# Patient Record
Sex: Male | Born: 1935
Health system: Southern US, Community
[De-identification: ages and names within clinical notes are randomized; demographics above are authoritative.]

## PROBLEM LIST (undated history)

## (undated) DIAGNOSIS — N200 Calculus of kidney: Secondary | ICD-10-CM

## (undated) DIAGNOSIS — K5790 Diverticulosis of intestine, part unspecified, without perforation or abscess without bleeding: Secondary | ICD-10-CM

## (undated) DIAGNOSIS — I10 Essential (primary) hypertension: Secondary | ICD-10-CM

## (undated) DIAGNOSIS — I639 Cerebral infarction, unspecified: Secondary | ICD-10-CM

## (undated) DIAGNOSIS — F17200 Nicotine dependence, unspecified, uncomplicated: Secondary | ICD-10-CM

## (undated) DIAGNOSIS — I6529 Occlusion and stenosis of unspecified carotid artery: Secondary | ICD-10-CM

## (undated) DIAGNOSIS — E785 Hyperlipidemia, unspecified: Secondary | ICD-10-CM

## (undated) HISTORY — DX: Essential (primary) hypertension: I10

## (undated) HISTORY — DX: Diverticulosis of intestine, part unspecified, without perforation or abscess without bleeding: K57.90

## (undated) HISTORY — DX: Hyperlipidemia, unspecified: E78.5

## (undated) HISTORY — PX: APPENDECTOMY: SHX54

## (undated) HISTORY — PX: HERNIA REPAIR: SHX51

## (undated) HISTORY — DX: Nicotine dependence, unspecified, uncomplicated: F17.200

## (undated) HISTORY — DX: Cerebral infarction, unspecified: I63.9

---

## 2008-10-12 ENCOUNTER — Ambulatory Visit: Payer: Self-pay | Admitting: Family Medicine

## 2008-10-22 ENCOUNTER — Ambulatory Visit: Payer: Self-pay | Admitting: Family Medicine

## 2008-11-02 ENCOUNTER — Ambulatory Visit: Payer: Self-pay | Admitting: Family Medicine

## 2008-11-13 ENCOUNTER — Ambulatory Visit: Payer: Self-pay | Admitting: Gastroenterology

## 2008-11-27 ENCOUNTER — Ambulatory Visit: Payer: Self-pay | Admitting: Gastroenterology

## 2008-12-08 ENCOUNTER — Ambulatory Visit: Payer: Self-pay | Admitting: Family Medicine

## 2009-05-22 DIAGNOSIS — I639 Cerebral infarction, unspecified: Secondary | ICD-10-CM

## 2009-05-22 HISTORY — DX: Cerebral infarction, unspecified: I63.9

## 2009-09-14 ENCOUNTER — Ambulatory Visit: Payer: Self-pay | Admitting: Family Medicine

## 2009-09-16 ENCOUNTER — Encounter: Payer: Self-pay | Admitting: Cardiovascular Disease

## 2009-09-27 ENCOUNTER — Encounter: Payer: Self-pay | Admitting: Cardiovascular Disease

## 2009-10-19 ENCOUNTER — Ambulatory Visit (HOSPITAL_COMMUNITY): Admission: RE | Admit: 2009-10-19 | Discharge: 2009-10-19 | Payer: Self-pay | Admitting: General Surgery

## 2009-10-19 ENCOUNTER — Encounter: Payer: Self-pay | Admitting: Cardiovascular Disease

## 2009-10-19 ENCOUNTER — Encounter: Admission: RE | Admit: 2009-10-19 | Discharge: 2009-10-19 | Payer: Self-pay | Admitting: General Surgery

## 2009-10-20 ENCOUNTER — Encounter: Payer: Self-pay | Admitting: Cardiovascular Disease

## 2009-10-21 ENCOUNTER — Ambulatory Visit: Payer: Self-pay | Admitting: Cardiovascular Disease

## 2009-10-21 DIAGNOSIS — I4510 Unspecified right bundle-branch block: Secondary | ICD-10-CM

## 2009-10-26 ENCOUNTER — Encounter: Payer: Self-pay | Admitting: Cardiovascular Disease

## 2009-10-26 ENCOUNTER — Ambulatory Visit (HOSPITAL_COMMUNITY): Admission: RE | Admit: 2009-10-26 | Discharge: 2009-10-26 | Payer: Self-pay | Admitting: Cardiovascular Disease

## 2009-10-26 ENCOUNTER — Ambulatory Visit: Payer: Self-pay | Admitting: Cardiovascular Disease

## 2009-10-26 ENCOUNTER — Ambulatory Visit: Payer: Self-pay

## 2009-11-16 ENCOUNTER — Ambulatory Visit (HOSPITAL_COMMUNITY): Admission: RE | Admit: 2009-11-16 | Discharge: 2009-11-16 | Payer: Self-pay | Admitting: General Surgery

## 2009-11-17 ENCOUNTER — Inpatient Hospital Stay (HOSPITAL_COMMUNITY): Admission: EM | Admit: 2009-11-17 | Discharge: 2009-11-24 | Payer: Self-pay | Admitting: Emergency Medicine

## 2009-11-17 ENCOUNTER — Ambulatory Visit: Payer: Self-pay | Admitting: Internal Medicine

## 2009-11-17 DIAGNOSIS — I639 Cerebral infarction, unspecified: Secondary | ICD-10-CM

## 2009-11-17 HISTORY — DX: Cerebral infarction, unspecified: I63.9

## 2009-11-19 ENCOUNTER — Ambulatory Visit: Payer: Self-pay | Admitting: Physical Medicine & Rehabilitation

## 2009-11-19 ENCOUNTER — Encounter (INDEPENDENT_AMBULATORY_CARE_PROVIDER_SITE_OTHER): Payer: Self-pay | Admitting: Pediatrics

## 2009-11-24 ENCOUNTER — Inpatient Hospital Stay (HOSPITAL_COMMUNITY)
Admission: RE | Admit: 2009-11-24 | Discharge: 2009-12-15 | Payer: Self-pay | Admitting: Physical Medicine & Rehabilitation

## 2009-11-25 ENCOUNTER — Ambulatory Visit: Payer: Self-pay | Admitting: Physical Medicine & Rehabilitation

## 2009-12-17 DIAGNOSIS — E785 Hyperlipidemia, unspecified: Secondary | ICD-10-CM | POA: Insufficient documentation

## 2009-12-17 DIAGNOSIS — I69959 Hemiplegia and hemiparesis following unspecified cerebrovascular disease affecting unspecified side: Secondary | ICD-10-CM

## 2010-03-16 ENCOUNTER — Ambulatory Visit: Payer: Self-pay | Admitting: Family Medicine

## 2010-04-26 ENCOUNTER — Encounter
Admission: RE | Admit: 2010-04-26 | Discharge: 2010-05-18 | Payer: Self-pay | Source: Home / Self Care | Attending: Family Medicine | Admitting: Family Medicine

## 2010-05-11 ENCOUNTER — Ambulatory Visit: Payer: Self-pay | Admitting: Family Medicine

## 2010-05-18 ENCOUNTER — Encounter
Admission: RE | Admit: 2010-05-18 | Discharge: 2010-06-21 | Payer: Self-pay | Source: Home / Self Care | Attending: Family Medicine | Admitting: Family Medicine

## 2010-06-01 ENCOUNTER — Encounter: Admit: 2010-06-01 | Payer: Self-pay | Admitting: Family Medicine

## 2010-06-04 ENCOUNTER — Observation Stay (HOSPITAL_COMMUNITY)
Admission: EM | Admit: 2010-06-04 | Discharge: 2010-06-06 | Payer: Self-pay | Source: Home / Self Care | Attending: Urology | Admitting: Urology

## 2010-06-06 LAB — DIFFERENTIAL
Basophils Absolute: 0 10*3/uL (ref 0.0–0.1)
Basophils Absolute: 0 10*3/uL (ref 0.0–0.1)
Basophils Relative: 0 % (ref 0–1)
Basophils Relative: 0 % (ref 0–1)
Eosinophils Absolute: 0 10*3/uL (ref 0.0–0.7)
Eosinophils Absolute: 0 10*3/uL (ref 0.0–0.7)
Eosinophils Relative: 0 % (ref 0–5)
Eosinophils Relative: 0 % (ref 0–5)
Lymphocytes Relative: 3 % — ABNORMAL LOW (ref 12–46)
Lymphocytes Relative: 9 % — ABNORMAL LOW (ref 12–46)
Lymphs Abs: 0.5 10*3/uL — ABNORMAL LOW (ref 0.7–4.0)
Lymphs Abs: 1.3 10*3/uL (ref 0.7–4.0)
Monocytes Absolute: 0.9 10*3/uL (ref 0.1–1.0)
Monocytes Absolute: 1.3 10*3/uL — ABNORMAL HIGH (ref 0.1–1.0)
Monocytes Relative: 5 % (ref 3–12)
Monocytes Relative: 10 % (ref 3–12)
Neutro Abs: 14.6 10*3/uL — ABNORMAL HIGH (ref 1.7–7.7)
Neutro Abs: 11.5 10*3/uL — ABNORMAL HIGH (ref 1.7–7.7)
Neutrophils Relative %: 92 % — ABNORMAL HIGH (ref 43–77)
Neutrophils Relative %: 81 % — ABNORMAL HIGH (ref 43–77)

## 2010-06-06 LAB — URINALYSIS, ROUTINE W REFLEX MICROSCOPIC
Bilirubin Urine: NEGATIVE
Nitrite: POSITIVE — AB
Protein, ur: 30 mg/dL — AB
Specific Gravity, Urine: 1.022 (ref 1.005–1.030)
Urine Glucose, Fasting: NEGATIVE mg/dL
Urobilinogen, UA: 0.2 mg/dL (ref 0.0–1.0)
pH: 5.5 (ref 5.0–8.0)

## 2010-06-06 LAB — BASIC METABOLIC PANEL
BUN: 18 mg/dL (ref 6–23)
CO2: 25 mEq/L (ref 19–32)
Calcium: 9 mg/dL (ref 8.4–10.5)
Chloride: 105 mEq/L (ref 96–112)
Creatinine, Ser: 0.79 mg/dL (ref 0.4–1.5)
GFR calc Af Amer: >60 mL/min (ref >60)
GFR calc non Af Amer: >60 mL/min (ref >60)
Glucose, Bld: 129 mg/dL — ABNORMAL HIGH (ref 70–99)
Potassium: 3.9 mEq/L (ref 3.5–5.1)
Sodium: 138 mEq/L (ref 135–145)

## 2010-06-06 LAB — CBC
HCT: 41 % (ref 39.0–52.0)
HCT: 38.2 % — ABNORMAL LOW (ref 39.0–52.0)
Hemoglobin: 14.1 g/dL (ref 13.0–17.0)
Hemoglobin: 13.2 g/dL (ref 13.0–17.0)
MCH: 32.5 pg (ref 26.0–34.0)
MCH: 32.6 pg (ref 26.0–34.0)
MCHC: 34.4 g/dL (ref 30.0–36.0)
MCHC: 34.6 g/dL (ref 30.0–36.0)
MCV: 94.5 fL (ref 78.0–100.0)
MCV: 94.3 fL (ref 78.0–100.0)
Platelets: 233 10*3/uL (ref 150–400)
Platelets: 228 10*3/uL (ref 150–400)
RBC: 4.34 MIL/uL (ref 4.22–5.81)
RBC: 4.05 MIL/uL — ABNORMAL LOW (ref 4.22–5.81)
RDW: 13.7 % (ref 11.5–15.5)
RDW: 13.9 % (ref 11.5–15.5)
WBC: 16 10*3/uL — ABNORMAL HIGH (ref 4.0–10.5)
WBC: 14.1 10*3/uL — ABNORMAL HIGH (ref 4.0–10.5)

## 2010-06-06 LAB — URINE MICROSCOPIC-ADD ON

## 2010-06-08 LAB — DIFFERENTIAL
Basophils Absolute: 0 10*3/uL (ref 0.0–0.1)
Basophils Relative: 0 % (ref 0–1)
Eosinophils Absolute: 0 10*3/uL (ref 0.0–0.7)
Eosinophils Relative: 0 % (ref 0–5)
Lymphocytes Relative: 4 % — ABNORMAL LOW (ref 12–46)
Lymphs Abs: 0.5 10*3/uL — ABNORMAL LOW (ref 0.7–4.0)
Monocytes Absolute: 0.8 10*3/uL (ref 0.1–1.0)
Monocytes Relative: 7 % (ref 3–12)
Neutro Abs: 10.1 10*3/uL — ABNORMAL HIGH (ref 1.7–7.7)
Neutrophils Relative %: 89 % — ABNORMAL HIGH (ref 43–77)

## 2010-06-08 LAB — URINE CULTURE
Colony Count: >=100000
Culture  Setup Time: 201201151146

## 2010-06-08 LAB — CBC
HCT: 35.3 % — ABNORMAL LOW (ref 39.0–52.0)
Hemoglobin: 12.2 g/dL — ABNORMAL LOW (ref 13.0–17.0)
MCH: 32.4 pg (ref 26.0–34.0)
MCHC: 34.6 g/dL (ref 30.0–36.0)
MCV: 93.9 fL (ref 78.0–100.0)
Platelets: 223 10*3/uL (ref 150–400)
RBC: 3.76 MIL/uL — ABNORMAL LOW (ref 4.22–5.81)
RDW: 13.9 % (ref 11.5–15.5)
WBC: 11.4 10*3/uL — ABNORMAL HIGH (ref 4.0–10.5)

## 2010-06-12 ENCOUNTER — Encounter: Payer: Self-pay | Admitting: Physical Medicine & Rehabilitation

## 2010-06-12 NOTE — Discharge Summary (Signed)
  NAMEJUANDAVID, Richard Kent NO.:  [id number]  MEDICAL RECORD NO.:  [id number]          PATIENT TYPE:  INP  LOCATION:  1416                         FACILITY:  [facility]  PHYSICIAN:  Heloise Purpura, MD      DATE OF BIRTH:  [birth year]  DATE OF ADMISSION:  06/04/2010 DATE OF DISCHARGE:  06/06/2010                              DISCHARGE SUMMARY   ADMISSION DIAGNOSES: 1. Left ureteral calculus. 2. Urinary tract infection.  DISCHARGE DIAGNOSES: 1. Left ureteral calculus. 2. Urinary tract infection.  HISTORY AND PHYSICAL:  For full details, please see admission history and physical.  Briefly, Mr. Sedita is a 77 year old gentleman who presented to the emergency department on June 04, 2010, with complaints of back pain.  He underwent a CT scan, which demonstrated a 3 mm left distal ureteral calculus.  He was also noted to have leukocytosis and clearly infected urine on urinalysis.  He did remain afebrile.  We discussed options at that point.  Our plan was to try to avoid any kind of surgical intervention considering his age and medical comorbidities.  He was initially admitted to the hospital for IV pain control and IV antibiotic therapy.  He remained afebrile, but did not pass a stone and continued to have pain the following morning. Considering his apparent urinary tract infection and his inability to pass a stone and continued pain, we decided to proceed with surgical intervention and a ureteral stent.  He, therefore, went to the operating room on June 05, 2010, and a left ureteral stent was placed without complications.  He tolerated this procedure well and was transferred back to his hospital room.  He continued to remain afebrile and was maintained on IV ciprofloxacin.  The following morning, he was clinically doing well and his white blood count had improved from 14.1 to 11.4.  He was felt to be clinically stable for discharge home on continued empiric  ciprofloxacin with his urine culture results still pending.  DISPOSITION:  Home.  DISCHARGE MEDICATIONS:  He will resume his regular home medications.  In addition, he will be provided a prescription to take Vicodin as needed for pain and ciprofloxacin 500 mg p.o. b.i.d. for antibiotic therapy.  DISCHARGE INSTRUCTIONS:  He has been instructed that he may be ambulatory and has no physical activity restrictions.  He also has no dietary restrictions.  FOLLOWUP:  He will be scheduled for definitive ureteroscopic stone removal in approximately 10-14 days after appropriate antibiotic therapy.  We have discussed different treatment options and he does agree to proceed in this fashion.  He understands the potential risks, complications, and alternative treatment options associated with this approach.     Heloise Purpura, MD     LB/MEDQ  D:  06/06/2010  T:  06/06/2010  Job:  [id number]  Electronically Signed by Heloise Purpura MD on 06/12/2010 05:18:16 PM

## 2010-06-12 NOTE — H&P (Signed)
Richard Kent NO.:  [id number]  MEDICAL RECORD NO.:  [id number]          PATIENT TYPE:  EMS  LOCATION:  ED                           FACILITY:  [name]  PHYSICIAN:  Heloise Purpura, MD      DATE OF BIRTH:  1928/08/13  DATE OF ADMISSION:  06/04/2010 DATE OF DISCHARGE:                             HISTORY & PHYSICAL   CHIEF COMPLAINT:  Kidney stone.  HISTORY:  Richard Kent is a 77 year old gentleman who presented to the emergency department this afternoon after developing the acute onset of severe lower back pain.  He underwent an evaluation including a CT scan of the abdomen and pelvis without contrast which demonstrated a 3 mm distal left ureteral calculus with associated mild left hydronephrosis and hydroureter.  He was also noted a white blood count of 16,000 and his urinalysis was concerning for a urinary tract infection.  I was therefore consulted by the emergency department for further evaluationconsidering his complex stone.  PAST MEDICAL HISTORY: 1. History of cerebrovascular accident in June of 2011, chronically     managed with antiplatelet therapy.  He does have residual effects     including decrease motor function of his right upper and lower     extremities as well as urinary incontinence which was of new onset     since his stroke. 2. Hypertension. 3. Dyslipidemia.  PAST SURGICAL HISTORY: 1. Inguinal hernia repair. 2. Appendectomy.  MEDICATIONS: 1. Plavix 2. Aspirin. 3. Norvasc. 4. Lisinopril. 5. Zocor. 6. Baclofen. 7. Zanaflex 8. Senokot.  ALLERGIES:  No known drug allergies.  FAMILY HISTORY:  No history of urolithiasis or GU malignancy.  SOCIAL HISTORY:  Has a distant history of tobacco use but has quit many years.  He is married and lives at home with his wife.  REVIEW OF SYSTEMS:  A complete review of systems was performed. Pertinent negatives included no associated nausea/vomiting or fever. All other systems are  reviewed and are otherwise negative.  PHYSICAL EXAMINATION:  VITAL SIGNS:  He is currently afebrile with stable vital signs. GENERAL:  The patient is alert and oriented, in no acute distress. HEENT:  Normocephalic, atraumatic. NECK:  Supple without lymphadenopathy or JVD. CARDIOVASCULAR:  Regular rate and rhythm without obvious murmurs. LUNGS:  Clear bilaterally. ABDOMEN:  Soft, nontender, nondistended without abdominal masses or bruits. BACK:  No CVA tenderness. GU:  Normal male external genitalia. EXTREMITIES:  He has mild chronic edema of his right ankle which has been ongoing since his stroke.  This is unchanged according to his wife. He has no edema of his calf or thigh. NEUROLOGIC:  He has significant decrease motor function of his right upper and lower extremities.  LABORATORY DATA:  Urinalysis, pH 5.5.  Microscopic exam demonstrates too numerous to count white blood cells, too numerous to count red blood cells, and many bacteria with calcium oxalate crystals present.  White blood count 16.0, hemoglobin 14.1, hematocrit 41.  RADIOLOGIC IMAGING:  I independently reviewed his stone CT.  This demonstrates a 3-mm distal left ureteral calculus as stated above with associated left hydronephrosis and hydroureter.  IMPRESSIONS:  A 3-mm  left distal ureteral calculus with leukocytosis and evidence of a urinary tract infection.  PLAN:  Richard Kent is currently afebrile and otherwise asymptomatic.  I have given him the options of proceeding to the operating room for placement of a ureteral stent versus close observation with admission to the hospital and beginning antibiotic therapy with a trial of medical expulsion therapy with plans to proceed with stent placement tomorrow if he does not pass the stone.  Currently, he would like to avoid surgery if possible, especially considering his recent stroke.  I do think this is reasonable and therefore he will be admitted to hospital for  pain control and antibiotics and close observation.  He will have a KUB x-ray and his urine will be strained.  He will be on alpha-blocker therapy for medical expulsion treatment.     Heloise Purpura, MD     LB/MEDQ  D:  06/05/2010  T:  06/05/2010  Job:  [id number]  Electronically Signed by Heloise Purpura MD on 06/12/2010 05:18:11 PM

## 2010-06-12 NOTE — Op Note (Signed)
  NAMECRANSTON, Richard Kent NO.:  [id number]  MEDICAL RECORD NO.:  [id number]          PATIENT TYPE:  INP  LOCATION:  1416                         FACILITY:  [facility]  PHYSICIAN:  Heloise Purpura, MD      DATE OF BIRTH:  1928/12/17  DATE OF PROCEDURE:  06/05/2010 DATE OF DISCHARGE:                              OPERATIVE REPORT   PREOPERATIVE DIAGNOSIS:  Left ureteral stone and urinary tract infection with leukocytosis.  POSTOPERATIVE DIAGNOSIS:  Left ureteral stone and urinary tract infection with leukocytosis.  PROCEDURES: 1. Cystoscopy. 2. Left ureteral stent placement (6 x 24).  SURGEON:  Heloise Purpura, MD  ANESTHESIA:  General.  COMPLICATIONS:  None.  ESTIMATED BLOOD LOSS:  None.  INDICATION:  Richard Kent is a 76 year old gentleman who presented to the emergency department last evening with a distal left ureteral calculus. He is continued to have significant pain and also was noted to have a urinary tract infection and leukocytosis.  He remained afebrile and was initially managed with admission to the hospital and IV antibiotic therapy.  He has failed to pass the stone and after discussing the situation in more detail, he has agreed to proceed with left ureteral stent drainage considering his urinary tract infection and leukocytosis. The potential risks, complications, alternative treatment options were discussed in detail; and informed consent was obtained.  DESCRIPTION OF PROCEDURE:  The patient was taken to the operating room and a general anesthetic was administered.  He was given preoperative antibiotics, placed in the dorsal lithotomy position, and prepped and draped in the usual sterile fashion.  Next, preoperative time-out was performed.  Cystourethroscopy was then performed which revealed a normal anterior and posterior urethra.  Inspection of bladder revealed no evidence of any bladder tumors, stones, or other mucosal pathology on systematic  exam.  The ureteral orifices were in the normal anatomic position and attention turned to the left ureteral orifice which was intubated with a 0.038 sensor guidewire.  This was able to be advanced easily past the calculus which was faintly identified on fluoroscopy and passed up into the left renal pelvis under fluoroscopic guidance.  A 6 x 24 double-J ureteral stent was then advanced over the wire using Seldinger technique and positioned appropriately under fluoroscopic and cystoscopic guidance. The wire was then removed with a good curl noted in the renal pelvis as well as in the bladder.  The patient tolerated the procedure well and without complications.  He was able to be awakened and transferred to recovery unit in satisfactory condition.     Heloise Purpura, MD     LB/MEDQ  D:  06/05/2010  T:  06/05/2010  Job:  [id number]  Electronically Signed by Heloise Purpura MD on 06/12/2010 05:18:14 PM

## 2010-06-13 ENCOUNTER — Ambulatory Visit (HOSPITAL_COMMUNITY)
Admission: RE | Admit: 2010-06-13 | Discharge: 2010-06-13 | Payer: Self-pay | Source: Home / Self Care | Attending: Urology | Admitting: Urology

## 2010-06-14 LAB — SURGICAL PCR SCREEN
MRSA, PCR: NEGATIVE
Staphylococcus aureus: NEGATIVE

## 2010-06-21 NOTE — Assessment & Plan Note (Signed)
Summary: np6/surgical clearance/   Visit Type:  Initial Consult Primary Provider:  Sharlot Gowda, MD  CC:  Initial Consult/Surgical clearance-Hernia to be performed by Dr. Donell Beers.  History of Present Illness: 77 yo WM with no significant past medical history who is referred today for cardiac evaluation before planned ingiunal hernia repair on the right. He has been seen by Dr. Donell Beers from [facility]. His preoperative EKG showed RBBB with LAFB. He has no complaints. He denies chest pain, SOB, dizziness, palpitations, near syncope or syncope. He has good exercise tolerance. 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 He has no symptoms c/w claudication. He has no prior cardiac issues.   Preventive Screening-Counseling & Management  Alcohol-Tobacco     Smoking Status: quit  Caffeine-Diet-Exercise     Does Patient Exercise: yes  Current Medications (verified): 1)  Daily Multi  Tabs (Multiple Vitamins-Minerals) .... Take 1 By Mouth Once Daily  Allergies (verified): No Known Drug Allergies  Past History:  Past Medical History: Inguinal hernia  Past Surgical History: 1988 Appendectomy  Family History: Family History of Coronary Artery Disease:  Father deceased, had MI age 45, died at age 43 Mother deceased, CVA Siblings: Brother died age 36 from MI 4 sisters: 3 deceased, one with cancer, one died at age 51 ? cause        1 with MI.   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 He smoked for 20 years. Stopped 30 years ago.  Social alcohol No illicit drug use Regular Exercise - yes. Does weights at home.  Smoking Status:  quit Does Patient Exercise:  yes  Review of Systems  The patient denies fatigue, malaise, fever, weight gain/loss, vision loss, decreased hearing, hoarseness, chest pain, palpitations, shortness of breath, prolonged cough, wheezing, sleep apnea,  coughing up blood, abdominal pain, blood in stool, nausea, vomiting, diarrhea, heartburn, incontinence, blood in urine, muscle weakness, joint pain, leg swelling, rash, skin lesions, headache, fainting, dizziness, depression, anxiety, enlarged lymph nodes, easy bruising or bleeding, and environmental allergies.    Vital Signs:  Patient profile:   77 year old male Height:      72 inches Weight:      193 pounds BMI:     26.27 Pulse rate:   62 / minute BP sitting:   160 / 80  (left arm) Cuff size:   regular  Vitals Entered By: [name], EMT-P (October 21, 2009 3:55 PM)  Physical Exam  General:  General: Well developed, well nourished, NAD HEENT: OP clear, mucus membranes moist SKIN: warm, dry Neuro: No focal deficits Musculoskeletal: Muscle strength 5/5 all ext Psychiatric: Mood and affect normal Neck: No JVD, no carotid bruits, no thyromegaly, no lymphadenopathy. Lungs:Clear bilaterally, no wheezes, rhonci, crackles CV: RRR no murmurs, gallops rubs Abdomen: soft, NT, ND, BS present Extremities: No edema, pulses 2+.    EKG  Procedure date:  10/21/2009  Findings:      NSR, rate 62 bpm. RBBB. LAFB.   Impression & Recommendations:  Problem # 1:  RBBB (ICD-426.4) He is completely asymptomatic. He has bifascicular block. Will check echo to rule out structural heart disease. Based on current ACC/AHA guidelines, no ischemic workup is indicated in this pleasant gentleman prior to planned surgical procedure. I would like to get the echo before his surgery is scheduled. I will communicate the results with Dr. Donell Beers, Dr. Susann Givens and  the pt when available.   Orders: Echocardiogram (Echo)  Other Orders: EKG w/ Interpretation (93000)  Patient Instructions: 1)  Your physician recommends that you schedule a follow-up appointment in: 2 months 2)  Your physician has requested that you have an echocardiogram.  Echocardiography is a painless test that uses sound waves to create images of  your heart. It provides your doctor with information about the size and shape of your heart and how well your heart's chambers and valves are working.  This procedure takes approximately one hour. There are no restrictions for this procedure.  Appended Document: np6/surgical clearance/ Echo reviewed and ok. See attached report. Proceed with surgery. CDM  Appended Document: np6/surgical clearance/ Pt. notified

## 2010-06-21 NOTE — Letter (Signed)
Summary: [facility]: Referral Form  [facility]: Referral Form   Imported By: Earl Many 11/04/2009 16:34:53  _____________________________________________________________________  External Attachment:    Type:   Image     Comment:   External Document

## 2010-06-21 NOTE — Letter (Signed)
Summary: [facility]: Pt Medical Hx   [facility]: Pt Medical Hx   Imported By: Earl Many 11/05/2009 17:08:25  _____________________________________________________________________  External Attachment:    Type:   Image     Comment:   External Document

## 2010-06-21 NOTE — Letter (Signed)
Summary: [facility]: Pt Profile  [facility]: Pt Profile   Imported By: Earl Many 11/05/2009 17:11:17  _____________________________________________________________________  External Attachment:    Type:   Image     Comment:   External Document

## 2010-06-21 NOTE — Consult Note (Signed)
Summary: [facility]   Imported By: Earl Many 11/05/2009 17:20:24  _____________________________________________________________________  External Attachment:    Type:   Image     Comment:   External Document

## 2010-06-23 ENCOUNTER — Ambulatory Visit: Payer: Medicare Other | Attending: Family Medicine | Admitting: Physical Therapy

## 2010-06-23 ENCOUNTER — Encounter: Payer: Medicare Other | Admitting: Occupational Therapy

## 2010-06-23 DIAGNOSIS — M6281 Muscle weakness (generalized): Secondary | ICD-10-CM | POA: Insufficient documentation

## 2010-06-23 DIAGNOSIS — Z5189 Encounter for other specified aftercare: Secondary | ICD-10-CM | POA: Insufficient documentation

## 2010-06-23 DIAGNOSIS — I69998 Other sequelae following unspecified cerebrovascular disease: Secondary | ICD-10-CM | POA: Insufficient documentation

## 2010-06-23 DIAGNOSIS — M25669 Stiffness of unspecified knee, not elsewhere classified: Secondary | ICD-10-CM | POA: Insufficient documentation

## 2010-06-23 DIAGNOSIS — M2420 Disorder of ligament, unspecified site: Secondary | ICD-10-CM | POA: Insufficient documentation

## 2010-06-23 DIAGNOSIS — R279 Unspecified lack of coordination: Secondary | ICD-10-CM | POA: Insufficient documentation

## 2010-06-23 DIAGNOSIS — M2560 Stiffness of unspecified joint, not elsewhere classified: Secondary | ICD-10-CM | POA: Insufficient documentation

## 2010-06-23 DIAGNOSIS — R262 Difficulty in walking, not elsewhere classified: Secondary | ICD-10-CM | POA: Insufficient documentation

## 2010-06-23 DIAGNOSIS — M629 Disorder of muscle, unspecified: Secondary | ICD-10-CM | POA: Insufficient documentation

## 2010-06-25 NOTE — Op Note (Signed)
Richard Kent, Richard Kent NO.:  [id number]  MEDICAL RECORD NO.:  [id number]          PATIENT TYPE:  AMB  LOCATION:  DAY                          FACILITY:  [facility]  PHYSICIAN:  Heloise Purpura, MD      DATE OF BIRTH:  [birth year]  DATE OF PROCEDURE:  06/13/2010 DATE OF DISCHARGE:                              OPERATIVE REPORT   PREOPERATIVE DIAGNOSIS:  Left ureteral calculus.  POSTOPERATIVE DIAGNOSIS:  Left ureteral calculus.  PROCEDURES: 1. Cystoscopy. 2. Left ureteroscopic stone removal. 3. Left ureteral stent placement (6 x 24).  SURGEON:  Heloise Purpura, MD  ANESTHESIA:  General.  COMPLICATIONS:  None.  INDICATIONS:  Richard Kent is a 77 year old gentleman who presented to the emergency department approximately 10 days ago with complaints of left abdominal pain and was found have a 3-mm distal left ureteral calculus.  He was also noted to have a leukocytosis and a urinary tract infection.  He was admitted to the hospital on IV antibiotics and given a chance to pass the stone but was unable to.  It was therefore felt that he should undergo ureteral stent placement in the setting of an infection.  However, he did remain afebrile during his hospitalization. He was treated with culture specific antibiotics for 10 days and presents today for definitive treatment of his stone.  After discussing options for treatment, he elected to proceed with ureteroscopic therapy. The potential risks, complications, and alternative treatment options associated with the above procedures were discussed in detail and informed consent was obtained.  DESCRIPTION OF PROCEDURE:  The patient was taken to the operating room and a general anesthetic was administered.  He was given preoperative antibiotics, placed in the dorsal lithotomy position, and prepped and draped in the usual sterile fashion.  Next, a preoperative time-out was performed.  Cystourethroscopy was then performed which  revealed a normal anterior and posterior urethra.  Inspection of the bladder revealed the ureteral orifices to be in the normal anatomic position with some edema of the left ureteral orifice and an indwelling left ureteral stent.  The ureteral stent was then removed with the flexible graspers.  An attempt was made to place the wire up through the stent, although unfortunately the stent did migrate down and out of the ureter.  Therefore, the wire was replaced under direct vision.  There was noted be some mild resistance initially although the wire then passed up into the renal pelvis without difficulty.  A 6-French semi-rigid ureteroscope was then placed next to the wire and into the distal ureter.  The wire was noted to have gone intramural for short distance just below the obstructing stone.  Therefore, the wire was removed under direct vision replaced within the true lumen of the ureter.  A 0 tip nitinol basket was then used to grasp the stone and remove it without difficulty.  Based on the concern of the wire having gone intramural for short distance, it was decided to place a postoperative ureteral stent for stricture prevention.  Therefore, the wire was backloaded on the cystoscope and a 6 x 24 double-J ureteral stent was advanced over the  wire using Seldinger technique and positioned appropriately within the renal pelvis in the bladder.  The wire was then removed with good curl noted in the renal pelvis as well as in the bladder and the procedure was ended following the bladder being emptied.  He tolerated the procedure well and without complications.  He was able to be awakened and transferred to recovery unit in satisfactory condition.     Heloise Purpura, MD     LB/MEDQ  D:  06/13/2010  T:  06/13/2010  Job:  [id number]  Electronically Signed by Heloise Purpura MD on 06/25/2010 06:27:07 AM

## 2010-06-28 ENCOUNTER — Ambulatory Visit: Payer: Medicare Other | Admitting: Occupational Therapy

## 2010-06-29 ENCOUNTER — Ambulatory Visit: Payer: Medicare Other | Admitting: Physical Therapy

## 2010-06-29 ENCOUNTER — Ambulatory Visit: Payer: Medicare Other | Admitting: Occupational Therapy

## 2010-07-01 ENCOUNTER — Ambulatory Visit: Payer: Medicare Other | Admitting: Physical Therapy

## 2010-07-05 ENCOUNTER — Ambulatory Visit: Payer: Medicare Other | Admitting: Occupational Therapy

## 2010-07-05 ENCOUNTER — Ambulatory Visit: Payer: Medicare Other | Admitting: Physical Therapy

## 2010-07-07 ENCOUNTER — Ambulatory Visit: Payer: Medicare Other | Admitting: Occupational Therapy

## 2010-07-07 ENCOUNTER — Ambulatory Visit: Payer: Medicare Other | Admitting: Physical Therapy

## 2010-07-11 ENCOUNTER — Ambulatory Visit: Payer: Self-pay | Admitting: Physical Therapy

## 2010-07-11 ENCOUNTER — Encounter: Payer: Self-pay | Admitting: Occupational Therapy

## 2010-07-15 ENCOUNTER — Ambulatory Visit: Payer: Medicare Other | Admitting: Occupational Therapy

## 2010-07-15 ENCOUNTER — Ambulatory Visit: Payer: Medicare Other | Admitting: Physical Therapy

## 2010-07-18 ENCOUNTER — Ambulatory Visit: Payer: Medicare Other | Admitting: Occupational Therapy

## 2010-07-18 ENCOUNTER — Ambulatory Visit: Payer: Medicare Other | Admitting: Physical Therapy

## 2010-07-20 ENCOUNTER — Ambulatory Visit: Payer: Self-pay | Admitting: Physical Therapy

## 2010-07-20 ENCOUNTER — Ambulatory Visit: Payer: Medicare Other | Admitting: Occupational Therapy

## 2010-07-22 ENCOUNTER — Ambulatory Visit: Payer: Medicare Other | Attending: Family Medicine | Admitting: Physical Therapy

## 2010-07-22 DIAGNOSIS — M25669 Stiffness of unspecified knee, not elsewhere classified: Secondary | ICD-10-CM | POA: Insufficient documentation

## 2010-07-22 DIAGNOSIS — M2560 Stiffness of unspecified joint, not elsewhere classified: Secondary | ICD-10-CM | POA: Insufficient documentation

## 2010-07-22 DIAGNOSIS — M6281 Muscle weakness (generalized): Secondary | ICD-10-CM | POA: Insufficient documentation

## 2010-07-22 DIAGNOSIS — Z5189 Encounter for other specified aftercare: Secondary | ICD-10-CM | POA: Insufficient documentation

## 2010-07-22 DIAGNOSIS — I69998 Other sequelae following unspecified cerebrovascular disease: Secondary | ICD-10-CM | POA: Insufficient documentation

## 2010-07-22 DIAGNOSIS — R262 Difficulty in walking, not elsewhere classified: Secondary | ICD-10-CM | POA: Insufficient documentation

## 2010-07-22 DIAGNOSIS — M629 Disorder of muscle, unspecified: Secondary | ICD-10-CM | POA: Insufficient documentation

## 2010-07-22 DIAGNOSIS — R279 Unspecified lack of coordination: Secondary | ICD-10-CM | POA: Insufficient documentation

## 2010-07-22 DIAGNOSIS — M2420 Disorder of ligament, unspecified site: Secondary | ICD-10-CM | POA: Insufficient documentation

## 2010-07-28 ENCOUNTER — Ambulatory Visit: Payer: Medicare Other | Admitting: Occupational Therapy

## 2010-07-29 ENCOUNTER — Ambulatory Visit: Payer: Medicare Other | Admitting: Occupational Therapy

## 2010-08-02 ENCOUNTER — Ambulatory Visit: Payer: Medicare Other | Admitting: Occupational Therapy

## 2010-08-04 ENCOUNTER — Ambulatory Visit: Payer: Medicare Other | Admitting: Occupational Therapy

## 2010-08-06 LAB — URINE CULTURE: Culture: NO GROWTH

## 2010-08-06 LAB — BASIC METABOLIC PANEL
BUN: 18 mg/dL (ref 6–23)
Creatinine, Ser: 0.77 mg/dL (ref 0.4–1.5)
GFR calc non Af Amer: >60 mL/min (ref >60)

## 2010-08-06 LAB — URINE MICROSCOPIC-ADD ON

## 2010-08-06 LAB — GLUCOSE, CAPILLARY
Glucose-Capillary: 105 mg/dL — ABNORMAL HIGH (ref 70–99)
Glucose-Capillary: 111 mg/dL — ABNORMAL HIGH (ref 70–99)
Glucose-Capillary: 118 mg/dL — ABNORMAL HIGH (ref 70–99)

## 2010-08-06 LAB — URINALYSIS, ROUTINE W REFLEX MICROSCOPIC
Specific Gravity, Urine: 1.027 (ref 1.005–1.030)
Urobilinogen, UA: 1 mg/dL (ref 0.0–1.0)
pH: 5 (ref 5.0–8.0)

## 2010-08-07 LAB — URINE MICROSCOPIC-ADD ON

## 2010-08-07 LAB — URINE CULTURE: Colony Count: >=100000

## 2010-08-07 LAB — CBC
HCT: 36.1 % — ABNORMAL LOW (ref 39.0–52.0)
HCT: 45.4 % (ref 39.0–52.0)
HCT: 48.3 % (ref 39.0–52.0)
Hemoglobin: 12.5 g/dL — ABNORMAL LOW (ref 13.0–17.0)
Hemoglobin: 13.4 g/dL (ref 13.0–17.0)
Hemoglobin: 14.2 g/dL (ref 13.0–17.0)
Hemoglobin: 15.3 g/dL (ref 13.0–17.0)
Hemoglobin: 15.4 g/dL (ref 13.0–17.0)
Hemoglobin: 16.9 g/dL (ref 13.0–17.0)
MCH: 34.5 pg — ABNORMAL HIGH (ref 26.0–34.0)
MCH: 34.7 pg — ABNORMAL HIGH (ref 26.0–34.0)
MCHC: 33.9 g/dL (ref 30.0–36.0)
MCHC: 34.5 g/dL (ref 30.0–36.0)
MCHC: 34.7 g/dL (ref 30.0–36.0)
MCHC: 34.6 g/dL (ref 30.0–36.0)
MCV: 100.1 fL — ABNORMAL HIGH (ref 78.0–100.0)
MCV: 99.4 fL (ref 78.0–100.0)
Platelets: 216 10*3/uL (ref 150–400)
Platelets: 394 10*3/uL (ref 150–400)
RBC: 3.59 MIL/uL — ABNORMAL LOW (ref 4.22–5.81)
RBC: 3.86 MIL/uL — ABNORMAL LOW (ref 4.22–5.81)
RBC: 4.12 MIL/uL — ABNORMAL LOW (ref 4.22–5.81)
RBC: 4.57 MIL/uL (ref 4.22–5.81)
RBC: 4.37 MIL/uL (ref 4.22–5.81)
RBC: 4.42 MIL/uL (ref 4.22–5.81)
RBC: 4.84 MIL/uL (ref 4.22–5.81)
WBC: 12.2 10*3/uL — ABNORMAL HIGH (ref 4.0–10.5)
WBC: 10.2 10*3/uL (ref 4.0–10.5)
WBC: 6.4 10*3/uL (ref 4.0–10.5)

## 2010-08-07 LAB — GLUCOSE, CAPILLARY
Glucose-Capillary: 109 mg/dL — ABNORMAL HIGH (ref 70–99)
Glucose-Capillary: 116 mg/dL — ABNORMAL HIGH (ref 70–99)
Glucose-Capillary: 125 mg/dL — ABNORMAL HIGH (ref 70–99)
Glucose-Capillary: 134 mg/dL — ABNORMAL HIGH (ref 70–99)
Glucose-Capillary: 137 mg/dL — ABNORMAL HIGH (ref 70–99)
Glucose-Capillary: 151 mg/dL — ABNORMAL HIGH (ref 70–99)
Glucose-Capillary: 153 mg/dL — ABNORMAL HIGH (ref 70–99)

## 2010-08-07 LAB — DIFFERENTIAL
Basophils Absolute: 0 10*3/uL (ref 0.0–0.1)
Basophils Absolute: 0.1 10*3/uL (ref 0.0–0.1)
Basophils Relative: 0 % (ref 0–1)
Basophils Relative: 1 % (ref 0–1)
Eosinophils Absolute: 0.1 10*3/uL (ref 0.0–0.7)
Lymphocytes Relative: 13 % (ref 12–46)
Lymphocytes Relative: 13 % (ref 12–46)
Monocytes Absolute: 0.8 10*3/uL (ref 0.1–1.0)
Monocytes Absolute: 1.4 10*3/uL — ABNORMAL HIGH (ref 0.1–1.0)
Monocytes Relative: 8 % (ref 3–12)
Neutro Abs: 7.1 10*3/uL (ref 1.7–7.7)
Neutro Abs: 8.3 10*3/uL — ABNORMAL HIGH (ref 1.7–7.7)
Neutro Abs: 7.3 10*3/uL (ref 1.7–7.7)
Neutrophils Relative %: 76 % (ref 43–77)
Neutrophils Relative %: 78 % — ABNORMAL HIGH (ref 43–77)
Neutrophils Relative %: 72 % (ref 43–77)

## 2010-08-07 LAB — BASIC METABOLIC PANEL
CO2: 24 mEq/L (ref 19–32)
CO2: 25 mEq/L (ref 19–32)
CO2: 26 mEq/L (ref 19–32)
CO2: 26 mEq/L (ref 19–32)
Calcium: 8.7 mg/dL (ref 8.4–10.5)
Calcium: 8.7 mg/dL (ref 8.4–10.5)
Calcium: 8.2 mg/dL — ABNORMAL LOW (ref 8.4–10.5)
Calcium: 9.4 mg/dL (ref 8.4–10.5)
Chloride: 107 mEq/L (ref 96–112)
Creatinine, Ser: 0.77 mg/dL (ref 0.4–1.5)
Creatinine, Ser: 0.92 mg/dL (ref 0.4–1.5)
GFR calc Af Amer: >60 mL/min (ref >60)
GFR calc Af Amer: >60 mL/min (ref >60)
GFR calc Af Amer: >60 mL/min (ref >60)
GFR calc non Af Amer: >60 mL/min (ref >60)
GFR calc non Af Amer: >60 mL/min (ref >60)
GFR calc non Af Amer: >60 mL/min (ref >60)
GFR calc non Af Amer: >60 mL/min (ref >60)
Glucose, Bld: 109 mg/dL — ABNORMAL HIGH (ref 70–99)
Glucose, Bld: 110 mg/dL — ABNORMAL HIGH (ref 70–99)
Glucose, Bld: 127 mg/dL — ABNORMAL HIGH (ref 70–99)
Potassium: 3.2 mEq/L — ABNORMAL LOW (ref 3.5–5.1)
Potassium: 3.6 mEq/L (ref 3.5–5.1)
Potassium: 4.8 mEq/L (ref 3.5–5.1)
Sodium: 135 mEq/L (ref 135–145)
Sodium: 141 mEq/L (ref 135–145)
Sodium: 137 mEq/L (ref 135–145)
Sodium: 139 mEq/L (ref 135–145)

## 2010-08-07 LAB — URINALYSIS, ROUTINE W REFLEX MICROSCOPIC
Bilirubin Urine: NEGATIVE
Glucose, UA: NEGATIVE mg/dL
Nitrite: POSITIVE — AB
Nitrite: NEGATIVE
Protein, ur: >300 mg/dL — AB
Specific Gravity, Urine: 1.027 (ref 1.005–1.030)
Specific Gravity, Urine: 1.025 (ref 1.005–1.030)
Urobilinogen, UA: 1 mg/dL (ref 0.0–1.0)
pH: 7 (ref 5.0–8.0)

## 2010-08-07 LAB — PROTIME-INR
INR: 0.98 (ref 0.00–1.49)
Prothrombin Time: 12.9 seconds (ref 11.6–15.2)

## 2010-08-07 LAB — COMPREHENSIVE METABOLIC PANEL
Alkaline Phosphatase: 47 U/L (ref 39–117)
BUN: 7 mg/dL (ref 6–23)
BUN: 9 mg/dL (ref 6–23)
CO2: 24 mEq/L (ref 19–32)
CO2: 24 mEq/L (ref 19–32)
Chloride: 105 mEq/L (ref 96–112)
Chloride: 106 mEq/L (ref 96–112)
Creatinine, Ser: 0.66 mg/dL (ref 0.4–1.5)
Creatinine, Ser: 0.8 mg/dL (ref 0.4–1.5)
GFR calc non Af Amer: >60 mL/min (ref >60)
GFR calc non Af Amer: >60 mL/min (ref >60)
Glucose, Bld: 121 mg/dL — ABNORMAL HIGH (ref 70–99)
Glucose, Bld: 105 mg/dL — ABNORMAL HIGH (ref 70–99)
Potassium: 4.3 mEq/L (ref 3.5–5.1)
Total Bilirubin: 0.9 mg/dL (ref 0.3–1.2)
Total Bilirubin: 0.8 mg/dL (ref 0.3–1.2)

## 2010-08-07 LAB — CK TOTAL AND CKMB (NOT AT ARMC)
CK, MB: 4.4 ng/mL — ABNORMAL HIGH (ref 0.3–4.0)
Relative Index: 3.7 — ABNORMAL HIGH (ref 0.0–2.5)
Total CK: 120 U/L (ref 7–232)

## 2010-08-07 LAB — POCT I-STAT, CHEM 8
Calcium, Ion: 0.99 mmol/L — ABNORMAL LOW (ref 1.12–1.32)
Chloride: 105 mEq/L (ref 96–112)
Glucose, Bld: 106 mg/dL — ABNORMAL HIGH (ref 70–99)
HCT: 46 % (ref 39.0–52.0)
TCO2: 23 mmol/L (ref 0–100)

## 2010-08-07 LAB — APTT: aPTT: 27 seconds (ref 24–37)

## 2010-08-07 LAB — LIPID PANEL
Cholesterol: 173 mg/dL (ref 0–200)
LDL Cholesterol: 108 mg/dL — ABNORMAL HIGH (ref 0–99)
Triglycerides: 98 mg/dL (ref <150)

## 2010-08-07 LAB — SURGICAL PCR SCREEN
MRSA, PCR: NEGATIVE
Staphylococcus aureus: NEGATIVE

## 2010-08-07 LAB — HEMOGLOBIN A1C
Hgb A1c MFr Bld: 6.4 % — ABNORMAL HIGH (ref <5.7)
Mean Plasma Glucose: 137 mg/dL — ABNORMAL HIGH (ref <117)

## 2010-08-07 LAB — TROPONIN I: Troponin I: 0.01 ng/mL (ref 0.00–0.06)

## 2010-08-09 ENCOUNTER — Ambulatory Visit: Payer: Medicare Other | Admitting: Occupational Therapy

## 2010-08-11 ENCOUNTER — Ambulatory Visit: Payer: Medicare Other | Admitting: Occupational Therapy

## 2010-08-16 ENCOUNTER — Ambulatory Visit: Payer: Medicare Other | Admitting: Occupational Therapy

## 2010-08-18 ENCOUNTER — Ambulatory Visit: Payer: Medicare Other | Admitting: Occupational Therapy

## 2010-08-23 ENCOUNTER — Ambulatory Visit: Payer: Medicare Other | Attending: Family Medicine | Admitting: Occupational Therapy

## 2010-08-23 DIAGNOSIS — M6281 Muscle weakness (generalized): Secondary | ICD-10-CM | POA: Insufficient documentation

## 2010-08-23 DIAGNOSIS — M2420 Disorder of ligament, unspecified site: Secondary | ICD-10-CM | POA: Insufficient documentation

## 2010-08-23 DIAGNOSIS — R262 Difficulty in walking, not elsewhere classified: Secondary | ICD-10-CM | POA: Insufficient documentation

## 2010-08-23 DIAGNOSIS — I69998 Other sequelae following unspecified cerebrovascular disease: Secondary | ICD-10-CM | POA: Insufficient documentation

## 2010-08-23 DIAGNOSIS — Z5189 Encounter for other specified aftercare: Secondary | ICD-10-CM | POA: Insufficient documentation

## 2010-08-23 DIAGNOSIS — M2560 Stiffness of unspecified joint, not elsewhere classified: Secondary | ICD-10-CM | POA: Insufficient documentation

## 2010-08-23 DIAGNOSIS — M25669 Stiffness of unspecified knee, not elsewhere classified: Secondary | ICD-10-CM | POA: Insufficient documentation

## 2010-08-23 DIAGNOSIS — M629 Disorder of muscle, unspecified: Secondary | ICD-10-CM | POA: Insufficient documentation

## 2010-08-23 DIAGNOSIS — R279 Unspecified lack of coordination: Secondary | ICD-10-CM | POA: Insufficient documentation

## 2010-08-25 ENCOUNTER — Ambulatory Visit: Payer: Medicare Other | Admitting: Occupational Therapy

## 2010-12-22 ENCOUNTER — Telehealth: Payer: Self-pay | Admitting: Family Medicine

## 2010-12-22 NOTE — Telephone Encounter (Signed)
I will sign one as soon as we can find one

## 2010-12-26 ENCOUNTER — Encounter: Payer: Self-pay | Admitting: Family Medicine

## 2010-12-27 ENCOUNTER — Encounter: Payer: Self-pay | Admitting: Family Medicine

## 2010-12-27 ENCOUNTER — Ambulatory Visit (INDEPENDENT_AMBULATORY_CARE_PROVIDER_SITE_OTHER): Payer: Medicare Other | Admitting: Family Medicine

## 2010-12-27 VITALS — BP 118/70 | HR 66 | Wt 174.0 lb

## 2010-12-27 DIAGNOSIS — E785 Hyperlipidemia, unspecified: Secondary | ICD-10-CM

## 2010-12-27 DIAGNOSIS — H6120 Impacted cerumen, unspecified ear: Secondary | ICD-10-CM

## 2010-12-27 DIAGNOSIS — I639 Cerebral infarction, unspecified: Secondary | ICD-10-CM

## 2010-12-27 DIAGNOSIS — I6350 Cerebral infarction due to unspecified occlusion or stenosis of unspecified cerebral artery: Secondary | ICD-10-CM

## 2010-12-27 DIAGNOSIS — I10 Essential (primary) hypertension: Secondary | ICD-10-CM

## 2010-12-27 DIAGNOSIS — Z79899 Other long term (current) drug therapy: Secondary | ICD-10-CM

## 2010-12-27 LAB — CBC WITH DIFFERENTIAL/PLATELET
Eosinophils Relative: 2 % (ref 0–5)
HCT: 39.7 % (ref 39.0–52.0)
Hemoglobin: 13.6 g/dL (ref 13.0–17.0)
Lymphocytes Relative: 18 % (ref 12–46)
Lymphs Abs: 1.2 10*3/uL (ref 0.7–4.0)
MCV: 96.1 fL (ref 78.0–100.0)
Monocytes Absolute: 0.5 10*3/uL (ref 0.1–1.0)
Platelets: 243 10*3/uL (ref 150–400)
RBC: 4.13 MIL/uL — ABNORMAL LOW (ref 4.22–5.81)
WBC: 6.6 10*3/uL (ref 4.0–10.5)

## 2010-12-27 LAB — COMPREHENSIVE METABOLIC PANEL
ALT: 24 U/L (ref 0–53)
CO2: 27 mEq/L (ref 19–32)
Calcium: 9.1 mg/dL (ref 8.4–10.5)
Chloride: 105 mEq/L (ref 96–112)
Creat: 0.86 mg/dL (ref 0.50–1.35)

## 2010-12-27 LAB — LIPID PANEL
Cholesterol: 135 mg/dL (ref 0–200)
Total CHOL/HDL Ratio: 2.9 Ratio

## 2010-12-27 NOTE — Patient Instructions (Signed)
Use ear drops and return later in the week for another lavage

## 2010-12-27 NOTE — Progress Notes (Signed)
  Subjective:    Patient ID: Richard Kent, male    DOB: 1929/05/06, 77 y.o.   MRN: [id number]  HPI He is here for a recheck. He is now finished with PT and OT. He does have I nerve stimulator for his right leg. He also gets periodic Botox for his right arm. He continues on medications listed in the chart. He does note slow improvement in his overall strength. He had his wife has no other concerns or complaints.   Review of Systems Negative except as above    Objective:   Physical Exam Alert and in no distress. Cardiac exam shows regular rhythm without murmurs or gallops. Lungs are clear to auscultation. TMs clear throat clear. Neck supple without adenopathy. sion.     Assessment & Plan:  CVA. Hypertension. Routine blood screening will be ordered.

## 2010-12-28 ENCOUNTER — Telehealth: Payer: Self-pay

## 2010-12-28 NOTE — Telephone Encounter (Signed)
Talked with Richard Kent gave her and Neo labs all okay

## 2011-01-12 ENCOUNTER — Encounter: Payer: Self-pay | Admitting: Family Medicine

## 2011-01-12 ENCOUNTER — Ambulatory Visit (INDEPENDENT_AMBULATORY_CARE_PROVIDER_SITE_OTHER): Payer: Medicare Other | Admitting: Family Medicine

## 2011-01-12 VITALS — BP 114/68 | HR 62

## 2011-01-12 DIAGNOSIS — H6120 Impacted cerumen, unspecified ear: Secondary | ICD-10-CM

## 2011-01-12 DIAGNOSIS — Z23 Encounter for immunization: Secondary | ICD-10-CM

## 2011-01-12 NOTE — Progress Notes (Signed)
  Subjective:    Patient ID: Richard Kent, male    DOB: 02-01-1929, 77 y.o.   MRN: [id number]  HPI He is here for cerumen removal.   Review of Systems     Objective:   Physical Exam Lavage was done to the left canal with removal of cerumen. TM and canal are normal.       Assessment & Plan:  Cerumen impaction. Return here as needed.

## 2011-04-24 ENCOUNTER — Other Ambulatory Visit: Payer: Self-pay | Admitting: Family Medicine

## 2011-04-24 NOTE — Telephone Encounter (Signed)
Are these ok 

## 2011-05-24 DIAGNOSIS — G8110 Spastic hemiplegia affecting unspecified side: Secondary | ICD-10-CM | POA: Diagnosis not present

## 2011-09-08 DIAGNOSIS — I69939 Monoplegia of upper limb following unspecified cerebrovascular disease affecting unspecified side: Secondary | ICD-10-CM | POA: Diagnosis not present

## 2011-09-08 DIAGNOSIS — G8110 Spastic hemiplegia affecting unspecified side: Secondary | ICD-10-CM | POA: Diagnosis not present

## 2011-11-22 ENCOUNTER — Emergency Department (HOSPITAL_COMMUNITY): Payer: Medicare Other

## 2011-11-22 ENCOUNTER — Encounter (HOSPITAL_COMMUNITY): Payer: Self-pay | Admitting: Emergency Medicine

## 2011-11-22 ENCOUNTER — Emergency Department (HOSPITAL_COMMUNITY)
Admission: EM | Admit: 2011-11-22 | Discharge: 2011-11-22 | Disposition: A | Discharge status: Home / Self Care | Payer: Medicare Other | Attending: Emergency Medicine | Admitting: Emergency Medicine

## 2011-11-22 DIAGNOSIS — I10 Essential (primary) hypertension: Secondary | ICD-10-CM | POA: Diagnosis not present

## 2011-11-22 DIAGNOSIS — R404 Transient alteration of awareness: Secondary | ICD-10-CM | POA: Diagnosis not present

## 2011-11-22 DIAGNOSIS — R55 Syncope and collapse: Secondary | ICD-10-CM | POA: Diagnosis not present

## 2011-11-22 DIAGNOSIS — Z8673 Personal history of transient ischemic attack (TIA), and cerebral infarction without residual deficits: Secondary | ICD-10-CM | POA: Insufficient documentation

## 2011-11-22 DIAGNOSIS — R4701 Aphasia: Secondary | ICD-10-CM | POA: Diagnosis not present

## 2011-11-22 DIAGNOSIS — I499 Cardiac arrhythmia, unspecified: Secondary | ICD-10-CM | POA: Diagnosis not present

## 2011-11-22 DIAGNOSIS — Z7901 Long term (current) use of anticoagulants: Secondary | ICD-10-CM | POA: Diagnosis not present

## 2011-11-22 DIAGNOSIS — Z79899 Other long term (current) drug therapy: Secondary | ICD-10-CM | POA: Diagnosis not present

## 2011-11-22 DIAGNOSIS — R61 Generalized hyperhidrosis: Secondary | ICD-10-CM | POA: Diagnosis not present

## 2011-11-22 LAB — COMPREHENSIVE METABOLIC PANEL
ALT: 32 U/L (ref 0–53)
Calcium: 9 mg/dL (ref 8.4–10.5)
GFR calc Af Amer: 90 mL/min — ABNORMAL LOW (ref >90)
Glucose, Bld: 150 mg/dL — ABNORMAL HIGH (ref 70–99)
Sodium: 142 mEq/L (ref 135–145)
Total Protein: 6.8 g/dL (ref 6.0–8.3)

## 2011-11-22 LAB — CBC WITH DIFFERENTIAL/PLATELET
Basophils Absolute: 0 10*3/uL (ref 0.0–0.1)
Basophils Relative: 1 % (ref 0–1)
Eosinophils Absolute: 0.1 10*3/uL (ref 0.0–0.7)
Eosinophils Relative: 1 % (ref 0–5)
Lymphs Abs: 1.4 10*3/uL (ref 0.7–4.0)
MCH: 33.4 pg (ref 26.0–34.0)
MCHC: 35.8 g/dL (ref 30.0–36.0)
MCV: 93.2 fL (ref 78.0–100.0)
Platelets: 215 10*3/uL (ref 150–400)
RDW: 13.7 % (ref 11.5–15.5)

## 2011-11-22 LAB — PROTIME-INR: Prothrombin Time: 13.4 seconds (ref 11.6–15.2)

## 2011-11-22 LAB — APTT: aPTT: 28 seconds (ref 24–37)

## 2011-11-22 NOTE — ED Provider Notes (Signed)
History     CSN: [id number]  Arrival date & time 11/22/11  Barry Brunner   First MD Initiated Contact with Patient 11/22/11 1945      Chief Complaint  Patient presents with  . Aphasia    (Consider location/radiation/quality/duration/timing/severity/associated sxs/prior treatment) HPI Comments: The patient has a history of cva two years ago with resulting right-sided hemiparesis.  He was at home watching his son Ihor Gully the grass when he suddenly became less responsive and developed an expressive aphasia.  This lasted for about 15 minutes, then seemed to resolve.  He now feels fine and is symptom-free.    The history is provided by the patient.    Past Medical History  Diagnosis Date  . Hypertension   . CVA (cerebral infarction) 2011  . Diverticulosis   . Smoker     Past Surgical History  Procedure Date  . Hernia repair     RIGHT    No family history on file.  History  Substance Use Topics  . Smoking status: Former Games developer  . Smokeless tobacco: Never Used  . Alcohol Use: No      Review of Systems  All other systems reviewed and are negative.    Allergies  Review of patient's allergies indicates no known allergies.  Home Medications   Current Outpatient Rx  Name Route Sig Dispense Refill  . AMLODIPINE BESYLATE 10 MG PO TABS  TAKE ONE TABLET BY MOUTH EVERY DAY. 90 tablet 3  . ASPIRIN 81 MG PO TABS Oral Take 81 mg by mouth daily.      Marland Kitchen BACLOFEN 10 MG PO TABS  TAKE ONE TABLET BY MOUTH THREE TIMES DAILY 90 each PRN  . LISINOPRIL 10 MG PO TABS  TAKE ONE TABLET BY MOUTH EVERY DAY 90 tablet 3  . PLAVIX 75 MG PO TABS  TAKE ONE TABLET BY MOUTH EVERY DAY 90 each 3  . SENOKOT S PO Oral Take by mouth.      Marland Kitchen SIMVASTATIN 20 MG PO TABS  TAKE ONE TABLET BY MOUTH EVERY DAY 90 tablet 3  . TIZANIDINE HCL 2 MG PO CAPS Oral Take 2 mg by mouth 2 (two) times daily.      Marland Kitchen TIZANIDINE HCL 2 MG PO TABS  TAKE ONE TABLET BY MOUTH TWICE DAILY 180 tablet 3    Pulse 59  Temp 98.4 F (36.9 C)  (Oral)  Resp 15  SpO2 96%  Physical Exam  Nursing note and vitals reviewed. Constitutional: He is oriented to person, place, and time. He appears well-developed and well-nourished. No distress.  HENT:  Head: Normocephalic and atraumatic.  Mouth/Throat: Oropharynx is clear and moist.  Eyes: EOM are normal. Pupils are equal, round, and reactive to light.  Neck: Normal range of motion. Neck supple.  Cardiovascular: Normal rate and regular rhythm.   No murmur heard. Pulmonary/Chest: Effort normal and breath sounds normal. No respiratory distress. He has no wheezes.  Abdominal: Soft. Bowel sounds are normal. He exhibits no distension. There is no tenderness.  Musculoskeletal: Normal range of motion.  Neurological: He is alert and oriented to person, place, and time. No cranial nerve deficit. He exhibits abnormal muscle tone. Coordination normal.       There is a right hemiparesis noted, however this is baseline from prior cva two years ago.  Skin: Skin is warm and dry. He is not diaphoretic.    ED Course  Procedures (including critical care time)  Labs Reviewed - No data to display No results found.  No diagnosis found.   Date: 11/22/2011  Rate: 62  Rhythm: normal sinus rhythm  QRS Axis: left  Intervals: normal  ST/T Wave abnormalities: normal  Conduction Disutrbances:right bundle branch block  Narrative Interpretation:   Old EKG Reviewed: unchanged    MDM   The patient arrived after a brief aphasic episode which occurred while outside in the heat.  The workup is unremarkable and the patient is symptom-free, back at baseline.  He is on plavix and is instructed to continue this and follow up with his pcp on Monday.       Geoffery Lyons, MD 11/22/11 2206

## 2011-11-22 NOTE — ED Notes (Signed)
Patient states he was on the porch watching his son cut the grass when he suddenly became diaphoretic, had sudden onset aphasia, and weakness, and near syncopal episode. States these symptoms lasted nearly 15 minutes then resolved on their on. CBG- 118, vital stable.

## 2011-11-28 ENCOUNTER — Ambulatory Visit (INDEPENDENT_AMBULATORY_CARE_PROVIDER_SITE_OTHER): Payer: Medicare Other | Admitting: Family Medicine

## 2011-11-28 ENCOUNTER — Encounter: Payer: Self-pay | Admitting: Family Medicine

## 2011-11-28 VITALS — BP 114/74 | HR 60

## 2011-11-28 DIAGNOSIS — Z8673 Personal history of transient ischemic attack (TIA), and cerebral infarction without residual deficits: Secondary | ICD-10-CM

## 2011-11-28 NOTE — Progress Notes (Signed)
  Subjective:    Patient ID: Richard Kent, male    DOB: 1928-09-28, 78 y.o.   MRN: [id number]  HPI He is here for evaluation after recent emergency room visit. The ER record was reviewed. He did have transient neurologic changes occurring at home and by the time he got to the hospital his symptoms have resolved. He does have a previous history of CVA. He did have evidence of weakness as well as aphasia that as mentioned above cleared fairly quickly. He continues on Plavix and aspirin.   Review of Systems     Objective:   Physical Exam Alert and in no distress. He is sitting comfortably in a wheelchair. Cardiac exam shows regular rhythm without murmurs or gallops. Lungs clear to auscultation.       Assessment & Plan:   1. H/O TIA (transient ischemic attack) and stroke    encouraged him to continue with his present medication regimen. Explained that he is on appropriate medications to help prevent this but obviously he had a breakthrough in his symptoms.

## 2011-12-08 DIAGNOSIS — G8110 Spastic hemiplegia affecting unspecified side: Secondary | ICD-10-CM | POA: Diagnosis not present

## 2012-01-12 ENCOUNTER — Emergency Department (HOSPITAL_COMMUNITY): Payer: Medicare Other

## 2012-01-12 ENCOUNTER — Encounter (HOSPITAL_COMMUNITY): Payer: Self-pay | Admitting: Emergency Medicine

## 2012-01-12 ENCOUNTER — Inpatient Hospital Stay (HOSPITAL_COMMUNITY)
Admission: EM | Admit: 2012-01-12 | Discharge: 2012-01-18 | DRG: 469 | Disposition: A | Discharge status: Rehabilitation Facility | Payer: Medicare Other | Attending: Internal Medicine | Admitting: Internal Medicine

## 2012-01-12 DIAGNOSIS — F17200 Nicotine dependence, unspecified, uncomplicated: Secondary | ICD-10-CM | POA: Diagnosis present

## 2012-01-12 DIAGNOSIS — I10 Essential (primary) hypertension: Secondary | ICD-10-CM | POA: Diagnosis present

## 2012-01-12 DIAGNOSIS — I69959 Hemiplegia and hemiparesis following unspecified cerebrovascular disease affecting unspecified side: Secondary | ICD-10-CM

## 2012-01-12 DIAGNOSIS — S72001A Fracture of unspecified part of neck of right femur, initial encounter for closed fracture: Secondary | ICD-10-CM

## 2012-01-12 DIAGNOSIS — S72033A Displaced midcervical fracture of unspecified femur, initial encounter for closed fracture: Secondary | ICD-10-CM | POA: Diagnosis not present

## 2012-01-12 DIAGNOSIS — I6529 Occlusion and stenosis of unspecified carotid artery: Secondary | ICD-10-CM | POA: Diagnosis present

## 2012-01-12 DIAGNOSIS — J9819 Other pulmonary collapse: Secondary | ICD-10-CM | POA: Diagnosis not present

## 2012-01-12 DIAGNOSIS — M25559 Pain in unspecified hip: Secondary | ICD-10-CM | POA: Diagnosis not present

## 2012-01-12 DIAGNOSIS — I4510 Unspecified right bundle-branch block: Secondary | ICD-10-CM | POA: Diagnosis present

## 2012-01-12 DIAGNOSIS — M79609 Pain in unspecified limb: Secondary | ICD-10-CM | POA: Diagnosis not present

## 2012-01-12 DIAGNOSIS — R0902 Hypoxemia: Secondary | ICD-10-CM | POA: Diagnosis not present

## 2012-01-12 DIAGNOSIS — S72009A Fracture of unspecified part of neck of unspecified femur, initial encounter for closed fracture: Principal | ICD-10-CM | POA: Diagnosis present

## 2012-01-12 DIAGNOSIS — E785 Hyperlipidemia, unspecified: Secondary | ICD-10-CM

## 2012-01-12 DIAGNOSIS — I6523 Occlusion and stenosis of bilateral carotid arteries: Secondary | ICD-10-CM

## 2012-01-12 DIAGNOSIS — W19XXXA Unspecified fall, initial encounter: Secondary | ICD-10-CM | POA: Diagnosis present

## 2012-01-12 DIAGNOSIS — S72019A Unspecified intracapsular fracture of unspecified femur, initial encounter for closed fracture: Secondary | ICD-10-CM

## 2012-01-12 DIAGNOSIS — J189 Pneumonia, unspecified organism: Secondary | ICD-10-CM | POA: Diagnosis not present

## 2012-01-12 HISTORY — DX: Calculus of kidney: N20.0

## 2012-01-12 HISTORY — DX: Cerebral infarction, unspecified: I63.9

## 2012-01-12 HISTORY — DX: Occlusion and stenosis of unspecified carotid artery: I65.29

## 2012-01-12 LAB — CBC WITH DIFFERENTIAL/PLATELET
Basophils Relative: 0 % (ref 0–1)
Eosinophils Absolute: 0 10*3/uL (ref 0.0–0.7)
Eosinophils Relative: 0 % (ref 0–5)
Hemoglobin: 15.6 g/dL (ref 13.0–17.0)
MCH: 33.8 pg (ref 26.0–34.0)
MCHC: 36.2 g/dL — ABNORMAL HIGH (ref 30.0–36.0)
MCV: 93.5 fL (ref 78.0–100.0)
Monocytes Relative: 7 % (ref 3–12)
Neutrophils Relative %: 86 % — ABNORMAL HIGH (ref 43–77)
Platelets: 206 10*3/uL (ref 150–400)

## 2012-01-12 LAB — PROTIME-INR: INR: 1.04 (ref 0.00–1.49)

## 2012-01-12 LAB — BASIC METABOLIC PANEL
BUN: 14 mg/dL (ref 6–23)
Calcium: 9.3 mg/dL (ref 8.4–10.5)
GFR calc non Af Amer: 78 mL/min — ABNORMAL LOW (ref >90)
Glucose, Bld: 120 mg/dL — ABNORMAL HIGH (ref 70–99)

## 2012-01-12 LAB — ABO/RH: ABO/RH(D): O POS

## 2012-01-12 MED ORDER — ONDANSETRON HCL 4 MG/2ML IJ SOLN
4.0000 mg | Freq: Once | INTRAMUSCULAR | Status: AC
  Administered 2012-01-12: 4 mg via INTRAVENOUS
  Filled 2012-01-12: qty 2

## 2012-01-12 MED ORDER — MORPHINE SULFATE 4 MG/ML IJ SOLN
4.0000 mg | INTRAMUSCULAR | Status: AC | PRN
  Administered 2012-01-12 (×2): 4 mg via INTRAVENOUS
  Filled 2012-01-12 (×2): qty 1

## 2012-01-12 NOTE — ED Notes (Signed)
Per EMS: Pt was with family, got out of wheelchair and walked across the room with his walker and fell, landing on R side. Denies LOC. No obvious deformities. When EMS arrived, pt's family had helped him into his wheelchair. Pain 7/10 on R side. Pt has mass on R side near hip joint - not attached to bone.

## 2012-01-12 NOTE — ED Provider Notes (Signed)
History     CSN: [id number]  Arrival date & time 01/12/12  4098   First MD Initiated Contact with Patient 01/12/12 1857      Chief Complaint  Patient presents with  . Fall    HPI Pt uses a wheelchair but was walking across the room with his walker and fell.  He is not sure if his leg gave out.  He did not lose consciousness.  He denies any syncope or dizziness.  No near syncope.  Pt states he now has a lump and swelling on his right leg.   Past Medical History  Diagnosis Date  . Hypertension   . CVA (cerebral infarction) 2011  . Diverticulosis   . Smoker     Past Surgical History  Procedure Date  . Hernia repair     RIGHT    No family history on file.  History  Substance Use Topics  . Smoking status: Former Games developer  . Smokeless tobacco: Never Used  . Alcohol Use: No      Review of Systems  All other systems reviewed and are negative.    Allergies  Review of patient's allergies indicates no known allergies.  Home Medications   Current Outpatient Rx  Name Route Sig Dispense Refill  . AMLODIPINE BESYLATE 10 MG PO TABS Oral Take 10 mg by mouth every morning.    Marland Kitchen VITAMIN C PO Oral Take 1 tablet by mouth every morning.     . ASPIRIN 81 MG PO TABS Oral Take 81 mg by mouth every morning.     Marland Kitchen BACLOFEN 10 MG PO TABS Oral Take 10 mg by mouth 3 (three) times daily.    Marland Kitchen CLOPIDOGREL BISULFATE 75 MG PO TABS Oral Take 75 mg by mouth every morning.    Marland Kitchen LISINOPRIL 10 MG PO TABS Oral Take 10 mg by mouth every morning.    . ADULT MULTIVITAMIN W/MINERALS CH Oral Take 1 tablet by mouth every morning.    Mancel Parsons S PO Oral Take 2 tablets by mouth at bedtime.     Marland Kitchen SIMVASTATIN 20 MG PO TABS Oral Take 20 mg by mouth every evening.    Marland Kitchen TIZANIDINE HCL 2 MG PO TABS Oral Take 2 mg by mouth 2 (two) times daily.      BP 148/56  Temp 97.7 F (36.5 C) (Oral)  Resp 20  SpO2 88%  Physical Exam  Nursing note and vitals reviewed. Constitutional: He appears well-developed and  well-nourished. No distress.  HENT:  Head: Normocephalic and atraumatic.  Right Ear: External ear normal.  Left Ear: External ear normal.  Eyes: Conjunctivae are normal. Right eye exhibits no discharge. Left eye exhibits no discharge. No scleral icterus.  Neck: Neck supple. No tracheal deviation present.  Cardiovascular: Normal rate, regular rhythm and intact distal pulses.   Pulmonary/Chest: Effort normal and breath sounds normal. No stridor. No respiratory distress. He has no wheezes. He has no rales.  Abdominal: Soft. Bowel sounds are normal. He exhibits no distension. There is no tenderness. There is no rebound and no guarding.  Musculoskeletal: He exhibits no edema and no tenderness.       ttp right hip, mild edema , pain with range of motion  Neurological: He is alert. He displays atrophy. No sensory deficit. Cranial nerve deficit:  no gross defecits noted. He exhibits abnormal muscle tone. He displays no seizure activity. Coordination normal.       Rue and rle weakness  Skin: Skin is warm and  dry. No rash noted.  Psychiatric: He has a normal mood and affect.    ED Course  Procedures (including critical care time)  Labs Reviewed  BASIC METABOLIC PANEL - Abnormal; Notable for the following:    Glucose, Bld 120 (*)     GFR calc non Af Amer 78 (*)     GFR calc Af Amer 90 (*)     All other components within normal limits  CBC WITH DIFFERENTIAL - Abnormal; Notable for the following:    WBC 18.8 (*)     MCHC 36.2 (*)     Neutrophils Relative 86 (*)     Neutro Abs 16.2 (*)     Lymphocytes Relative 6 (*)     Monocytes Absolute 1.3 (*)     All other components within normal limits  PROTIME-INR  TYPE AND SCREEN  ABO/RH   Dg Chest 1 View  01/12/2012  *RADIOLOGY REPORT*  Clinical Data: Fall, right hip pain  CHEST - 1 VIEW  Comparison: 10/19/2009  Findings: Increased interstitial markings, possibly chronic. Bibasilar atelectasis.  No pleural effusion or pneumothorax.  The heart is  normal in size.  IMPRESSION: No evidence of acute cardiopulmonary disease.   Original Report Authenticated By: Charline Bills, M.D.    Dg Hip Complete Right  01/12/2012  *RADIOLOGY REPORT*  Clinical Data: Fall, right hip pain  RIGHT HIP - COMPLETE 2+ VIEW  Comparison: None.  Findings: Subcapital right hip fracture.  Secondary varus angulation.  Left hip is unremarkable.  Degenerative changes of the lower lumbar spine.  IMPRESSION: Subcapital right hip fracture.   Original Report Authenticated By: Charline Bills, M.D.      1. Closed subcapital fracture of femur       MDM  Patient unfortunately has a subcapital fracture or of his right femur. This most likely was a mechanical fall in  that the patient generally uses a walker and a wheelchair. At this time he is medically stable. I will consult the hospitalist for admission. Dr. Carola Frost, orthopedics is here evaluating the patient in emergent department        Celene Kras, MD 01/12/12 2153

## 2012-01-12 NOTE — H&P (Signed)
PCP:   Carollee Herter, MD    Chief Complaint:   Hip pain  HPI: Richard Kent is a 78 y.o. male   has a past medical history of Hypertension; CVA (cerebral infarction) (2011); Diverticulosis; Smoker; Carotid stenosis; Kidney stone; and Stroke (11/17/09).   Presented with  With mechanical fall today walking with 4 prong cane. No syncope, no shortness of breath, no chest pain.  He does not get chest pain or shortness of breath with activity. He walks up the stairs and does some exercise with out any cardiac complaints. He had a stroke 2 years ago that has left him with residual right side weakness and some speech problems. On arrival to ER he was found to be mildly hypoxic down to 88%. Hew is a former smoker.   Review of Systems:    Pertinent positives include: joint pain   Constitutional:  No weight loss, night sweats, Fevers, chills, fatigue, weight loss  HEENT:  No headaches, Difficulty swallowing,Tooth/dental problems,Sore throat,  No sneezing, itching, ear ache, nasal congestion, post nasal drip,  Cardio-vascular:  No chest pain, Orthopnea, PND, anasarca, dizziness, palpitations.no Bilateral lower extremity swelling  GI:  No heartburn, indigestion, abdominal pain, nausea, vomiting, diarrhea, change in bowel habits, loss of appetite, melena, blood in stool, hematemesis Resp:  no shortness of breath at rest. No dyspnea on exertion, No excess mucus, no productive cough, No non-productive cough, No coughing up of blood.No change in color of mucus.No wheezing. Skin:  no rash or lesions. No jaundice GU:  no dysuria, change in color of urine, no urgency or frequency. No straining to urinate.  No flank pain.  Musculoskeletal:  No or no joint swelling. No decreased range of motion. No back pain.  Psych:  No change in mood or affect. No depression or anxiety. No memory loss.  Neuro: no tingling,  no double vision, no gait abnormality, no slurred speech, no confusion  Otherwise  ROS are negative except for above, 10 systems were reviewed  Past Medical History: Past Medical History  Diagnosis Date  . Hypertension   . CVA (cerebral infarction) 2011  . Diverticulosis   . Smoker   . Carotid stenosis   . Kidney stone   . Stroke 11/17/09   Past Surgical History  Procedure Date  . Hernia repair     RIGHT  . Appendectomy      Medications: Prior to Admission medications   Medication Sig Start Date End Date Taking? Authorizing Provider  amLODipine (NORVASC) 10 MG tablet Take 10 mg by mouth every morning.   Yes Historical Provider, MD  Ascorbic Acid (VITAMIN C PO) Take 1 tablet by mouth every morning.    Yes Historical Provider, MD  aspirin 81 MG tablet Take 81 mg by mouth every morning.    Yes Historical Provider, MD  baclofen (LIORESAL) 10 MG tablet Take 10 mg by mouth 3 (three) times daily.   Yes Historical Provider, MD  clopidogrel (PLAVIX) 75 MG tablet Take 75 mg by mouth every morning.   Yes Historical Provider, MD  lisinopril (PRINIVIL,ZESTRIL) 10 MG tablet Take 10 mg by mouth every morning.   Yes Historical Provider, MD  Multiple Vitamin (MULTIVITAMIN WITH MINERALS) TABS Take 1 tablet by mouth every morning.   Yes Historical Provider, MD  Sennosides-Docusate Sodium (SENOKOT S PO) Take 2 tablets by mouth at bedtime.    Yes Historical Provider, MD  simvastatin (ZOCOR) 20 MG tablet Take 20 mg by mouth every evening.   Yes Historical Provider,  MD  tiZANidine (ZANAFLEX) 2 MG tablet Take 2 mg by mouth 2 (two) times daily.   Yes Historical Provider, MD    Allergies:  No Known Allergies  Social History:  Ambulatory  cane Lives at  Home with family No M POA  Wife Corrie Dandy [phone],   reports that he has quit smoking. He has never used smokeless tobacco. He reports that he does not drink alcohol or use illicit drugs.   Family History: family history includes Cancer in his mother; Heart disease in his father; and Stroke in his mother.    Physical  Exam: Patient Vitals for the past 24 hrs:  BP Temp Temp src Resp SpO2  01/12/12 1900 148/56 mmHg 97.7 F (36.5 C) Oral 20  88 %    1. General:  in No Acute distress 2. Psychological: Alert and  Oriented 3. Head/ENT:   Moist Mucous Membranes                          Head Non traumatic, neck supple                          Normal  Dentition 4. SKIN: normal  Skin turgor,  Skin clean Dry and intact no rash 5. Heart: Regular rate and rhythm no Murmur, Rub or gallop 6. Lungs: occasional wheezes but no crackles, somewhat coarse breathsounds   7. Abdomen: Soft, non-tender, Non distended 8. Lower extremities: no clubbing, cyanosis, or edema 9. Neurologically Grossly intact, slight weakness on the right 10. MSK: Normal range of motion  body mass index is unknown because there is no height or weight on file.   Labs on Admission:   [facility] 01/12/12 1934  NA 141  K 3.7  CL 104  CO2 26  GLUCOSE 120*  BUN 14  CREATININE 0.92  CALCIUM 9.3  MG --  PHOS --   No results found for this basename: AST:2,ALT:2,ALKPHOS:2,BILITOT:2,PROT:2,ALBUMIN:2 in the last 72 hours No results found for this basename: LIPASE:2,AMYLASE:2 in the last 72 hours  Basename 01/12/12 1934  WBC 18.8*  NEUTROABS 16.2*  HGB 15.6  HCT 43.1  MCV 93.5  PLT 206   No results found for this basename: CKTOTAL:3,CKMB:3,CKMBINDEX:3,TROPONINI:3 in the last 72 hours No results found for this basename: TSH,T4TOTAL,FREET3,T3FREE,THYROIDAB in the last 72 hours No results found for this basename: VITAMINB12:2,FOLATE:2,FERRITIN:2,TIBC:2,IRON:2,RETICCTPCT:2 in the last 72 hours   Radiological Exams on Admission: Dg Chest 1 View  01/12/2012  *RADIOLOGY REPORT*  Clinical Data: Fall, right hip pain  CHEST - 1 VIEW  Comparison: 10/19/2009  Findings: Increased interstitial markings, possibly chronic. Bibasilar atelectasis.  No pleural effusion or pneumothorax.  The heart is normal in size.  IMPRESSION: No evidence of acute  cardiopulmonary disease.   Original Report Authenticated By: Charline Bills, M.D.    Dg Hip Complete Right  01/12/2012  *RADIOLOGY REPORT*  Clinical Data: Fall, right hip pain  RIGHT HIP - COMPLETE 2+ VIEW  Comparison: None.  Findings: Subcapital right hip fracture.  Secondary varus angulation.  Left hip is unremarkable.  Degenerative changes of the lower lumbar spine.  IMPRESSION: Subcapital right hip fracture.   Original Report Authenticated By: Charline Bills, M.D.     Chart has been reviewed  Assessment/Plan  74 m with hip fracture and hx of carotid artery stenosis  Present on Admission:  .Hip fracture - as per orthopedics. Patient is currently on Plavix likely will not be able  to go to OR in an immediate future. We'll admit and monitor. Given the hypoxia and history of medical problems will monitor on telemetry. From a cardiac standpoint he does well with minimal exercise. His cardiac silhouette does appear to be slightly enlarged  on his chest x-ray but it is a portable film. For preop purposes will obtain echogram. Will start on metoprolol perioperatively. .Hypertension - continue home medications  .Carotid artery calcification - I could not appreciate bruits since his last Dopplers for 2 years ago will repeat. I spoke to family and did explain to them that although there is a risk of undergoing an operation from cardio cerebral standpoint but  being bedbound secondary to a hip fracture also has extensive risks. We'll be able to judge his risks somewhat more accurately after Dopplers have been done. .Hypoxia - etiology unclear he is asymptomatic no chest pain or shortness of breath. His chest x-ray does show some interstitial disease and he has remote history of smoking. Give oxygen and write for incentive spirometer if this persists further workup will be required.   Prophylaxis: SCD  Protonix  CODE STATUS: FULL CODE  Other plan as per orders.  I have spent a total of 55 min on  this admission  Jamesha Ellsworth 01/12/2012, 11:49 PM

## 2012-01-12 NOTE — Consult Note (Signed)
[facility]  Brief note below. Full consult to follow  78 y/o male with h/o significant carotid stenosis (do not see studies indicating degree) and CVA with severe impact to R side of body, sustained a ground level fall earlier this evening.  Brought to  where he was found to have a R subcapital femoral neck fracture.  Ortho consulted for eval.    Full exam summary to follow    Right Lower Extremity  Pain with manipulation of R hip  R ankle, foot with equinus deformity  Sensation intact distally  Motor grossly intact within limits distally as well      + pulse noted to R leg  Xray  Displace R subcapital femoral neck fx  A/P  78 y/o male with significant medical comorbidities with R femoral neck fracture  1. Displaced R subcapital femoral neck fracture    Standard of care would be fixation- hemiarthroplasty   Family expresses concerns regarding carotid stenosis as pts stroke occurred after hernia repair, which led to the discovery of his stenosis  We recommend fixation if medically stable and family desires.  It is possible to treat non-op but it would take several months to heal if in fact it does    2. Medical issues per medical service  Mearl Latin, PA-C [facility] [phone] (P) 01/12/2012 10:54 PM

## 2012-01-13 ENCOUNTER — Inpatient Hospital Stay (HOSPITAL_COMMUNITY): Payer: Medicare Other

## 2012-01-13 ENCOUNTER — Emergency Department (HOSPITAL_COMMUNITY): Payer: Medicare Other

## 2012-01-13 ENCOUNTER — Encounter (HOSPITAL_COMMUNITY): Payer: Self-pay | Admitting: Nurse Practitioner

## 2012-01-13 DIAGNOSIS — M25559 Pain in unspecified hip: Secondary | ICD-10-CM | POA: Diagnosis not present

## 2012-01-13 DIAGNOSIS — S72009A Fracture of unspecified part of neck of unspecified femur, initial encounter for closed fracture: Secondary | ICD-10-CM | POA: Diagnosis not present

## 2012-01-13 DIAGNOSIS — R0902 Hypoxemia: Secondary | ICD-10-CM | POA: Diagnosis present

## 2012-01-13 DIAGNOSIS — Z96649 Presence of unspecified artificial hip joint: Secondary | ICD-10-CM | POA: Diagnosis not present

## 2012-01-13 DIAGNOSIS — I519 Heart disease, unspecified: Secondary | ICD-10-CM

## 2012-01-13 DIAGNOSIS — S72033A Displaced midcervical fracture of unspecified femur, initial encounter for closed fracture: Secondary | ICD-10-CM | POA: Diagnosis not present

## 2012-01-13 DIAGNOSIS — Z471 Aftercare following joint replacement surgery: Secondary | ICD-10-CM | POA: Diagnosis not present

## 2012-01-13 DIAGNOSIS — F17200 Nicotine dependence, unspecified, uncomplicated: Secondary | ICD-10-CM | POA: Diagnosis present

## 2012-01-13 DIAGNOSIS — J90 Pleural effusion, not elsewhere classified: Secondary | ICD-10-CM | POA: Diagnosis not present

## 2012-01-13 DIAGNOSIS — J9819 Other pulmonary collapse: Secondary | ICD-10-CM | POA: Diagnosis not present

## 2012-01-13 DIAGNOSIS — I6529 Occlusion and stenosis of unspecified carotid artery: Secondary | ICD-10-CM | POA: Diagnosis not present

## 2012-01-13 DIAGNOSIS — Z01810 Encounter for preprocedural cardiovascular examination: Secondary | ICD-10-CM

## 2012-01-13 DIAGNOSIS — I4510 Unspecified right bundle-branch block: Secondary | ICD-10-CM

## 2012-01-13 DIAGNOSIS — I658 Occlusion and stenosis of other precerebral arteries: Secondary | ICD-10-CM | POA: Diagnosis not present

## 2012-01-13 DIAGNOSIS — I69959 Hemiplegia and hemiparesis following unspecified cerebrovascular disease affecting unspecified side: Secondary | ICD-10-CM | POA: Diagnosis not present

## 2012-01-13 DIAGNOSIS — I10 Essential (primary) hypertension: Secondary | ICD-10-CM | POA: Diagnosis not present

## 2012-01-13 DIAGNOSIS — J189 Pneumonia, unspecified organism: Secondary | ICD-10-CM | POA: Diagnosis not present

## 2012-01-13 DIAGNOSIS — D62 Acute posthemorrhagic anemia: Secondary | ICD-10-CM | POA: Diagnosis not present

## 2012-01-13 DIAGNOSIS — I63239 Cerebral infarction due to unspecified occlusion or stenosis of unspecified carotid arteries: Secondary | ICD-10-CM | POA: Diagnosis not present

## 2012-01-13 DIAGNOSIS — Z5189 Encounter for other specified aftercare: Secondary | ICD-10-CM | POA: Diagnosis not present

## 2012-01-13 DIAGNOSIS — M25569 Pain in unspecified knee: Secondary | ICD-10-CM | POA: Diagnosis not present

## 2012-01-13 LAB — URINALYSIS, ROUTINE W REFLEX MICROSCOPIC
Bilirubin Urine: NEGATIVE
Glucose, UA: NEGATIVE mg/dL
Hgb urine dipstick: NEGATIVE
Ketones, ur: 15 mg/dL — AB
Protein, ur: NEGATIVE mg/dL
Urobilinogen, UA: 0.2 mg/dL (ref 0.0–1.0)

## 2012-01-13 LAB — CBC
HCT: 43.6 % (ref 39.0–52.0)
MCHC: 35.1 g/dL (ref 30.0–36.0)
MCV: 94.4 fL (ref 78.0–100.0)
Platelets: 182 10*3/uL (ref 150–400)
RDW: 14 % (ref 11.5–15.5)

## 2012-01-13 LAB — URINALYSIS, MICROSCOPIC ONLY
Ketones, ur: 15 mg/dL — AB
Leukocytes, UA: NEGATIVE
Nitrite: NEGATIVE
Protein, ur: NEGATIVE mg/dL
Urobilinogen, UA: 1 mg/dL (ref 0.0–1.0)

## 2012-01-13 LAB — BASIC METABOLIC PANEL
Calcium: 8.6 mg/dL (ref 8.4–10.5)
Potassium: 4 mEq/L (ref 3.5–5.1)

## 2012-01-13 LAB — D-DIMER, QUANTITATIVE: D-Dimer, Quant: 9.86 ug/mL-FEU — ABNORMAL HIGH (ref 0.00–0.48)

## 2012-01-13 MED ORDER — MORPHINE SULFATE 2 MG/ML IJ SOLN
0.5000 mg | INTRAMUSCULAR | Status: DC | PRN
  Administered 2012-01-13 – 2012-01-18 (×6): 0.5 mg via INTRAVENOUS
  Filled 2012-01-13 (×6): qty 1

## 2012-01-13 MED ORDER — TIZANIDINE HCL 2 MG PO TABS
2.0000 mg | ORAL_TABLET | Freq: Two times a day (BID) | ORAL | Status: DC
  Administered 2012-01-13 – 2012-01-18 (×11): 2 mg via ORAL
  Filled 2012-01-13 (×13): qty 1

## 2012-01-13 MED ORDER — ALBUTEROL SULFATE (5 MG/ML) 0.5% IN NEBU: 2.5000 mg | INHALATION_SOLUTION | RESPIRATORY_TRACT | Status: DC | PRN

## 2012-01-13 MED ORDER — SIMVASTATIN 20 MG PO TABS
20.0000 mg | ORAL_TABLET | Freq: Every evening | ORAL | Status: DC
  Administered 2012-01-13 – 2012-01-17 (×4): 20 mg via ORAL
  Filled 2012-01-13 (×6): qty 1

## 2012-01-13 MED ORDER — HYDROCODONE-ACETAMINOPHEN 5-325 MG PO TABS
1.0000 | ORAL_TABLET | Freq: Four times a day (QID) | ORAL | Status: DC | PRN
  Administered 2012-01-13 – 2012-01-14 (×4): 2 via ORAL
  Administered 2012-01-14 – 2012-01-16 (×2): 1 via ORAL
  Administered 2012-01-16: 2 via ORAL
  Administered 2012-01-16: 1 via ORAL
  Administered 2012-01-16 – 2012-01-18 (×5): 2 via ORAL
  Administered 2012-01-18: 1 via ORAL
  Filled 2012-01-13: qty 2
  Filled 2012-01-13: qty 1
  Filled 2012-01-13 (×2): qty 2
  Filled 2012-01-13: qty 1
  Filled 2012-01-13: qty 2
  Filled 2012-01-13: qty 1
  Filled 2012-01-13: qty 2
  Filled 2012-01-13: qty 1
  Filled 2012-01-13 (×7): qty 2

## 2012-01-13 MED ORDER — BACLOFEN 10 MG PO TABS
10.0000 mg | ORAL_TABLET | Freq: Three times a day (TID) | ORAL | Status: DC
  Administered 2012-01-13 – 2012-01-18 (×16): 10 mg via ORAL
  Filled 2012-01-13 (×18): qty 1

## 2012-01-13 MED ORDER — SODIUM CHLORIDE 0.9 % IV SOLN
INTRAVENOUS | Status: AC
  Administered 2012-01-13: 05:00:00 via INTRAVENOUS

## 2012-01-13 MED ORDER — IOHEXOL 350 MG/ML SOLN: 100.0000 mL | Freq: Once | INTRAVENOUS | Status: AC | PRN

## 2012-01-13 MED ORDER — AMLODIPINE BESYLATE 10 MG PO TABS
10.0000 mg | ORAL_TABLET | Freq: Every morning | ORAL | Status: DC
  Administered 2012-01-13 – 2012-01-16 (×4): 10 mg via ORAL
  Filled 2012-01-13 (×4): qty 1

## 2012-01-13 MED ORDER — BIOTENE DRY MOUTH MT LIQD
15.0000 mL | Freq: Two times a day (BID) | OROMUCOSAL | Status: DC
  Administered 2012-01-13 – 2012-01-18 (×11): 15 mL via OROMUCOSAL

## 2012-01-13 MED ORDER — METOPROLOL TARTRATE 12.5 MG HALF TABLET
12.5000 mg | ORAL_TABLET | Freq: Two times a day (BID) | ORAL | Status: DC
  Administered 2012-01-13 – 2012-01-16 (×7): 12.5 mg via ORAL
  Filled 2012-01-13 (×8): qty 1

## 2012-01-13 MED ORDER — SENNOSIDES-DOCUSATE SODIUM 8.6-50 MG PO TABS
2.0000 | ORAL_TABLET | Freq: Every day | ORAL | Status: DC
  Administered 2012-01-13 – 2012-01-17 (×5): 2 via ORAL
  Filled 2012-01-13 (×6): qty 2

## 2012-01-13 MED ORDER — LISINOPRIL 10 MG PO TABS
10.0000 mg | ORAL_TABLET | Freq: Every morning | ORAL | Status: DC
  Administered 2012-01-13 – 2012-01-16 (×4): 10 mg via ORAL
  Filled 2012-01-13 (×4): qty 1

## 2012-01-13 MED ORDER — ACETAMINOPHEN 10 MG/ML IV SOLN
1000.0000 mg | Freq: Four times a day (QID) | INTRAVENOUS | Status: DC
  Filled 2012-01-13 (×2): qty 100

## 2012-01-13 MED ORDER — METHOCARBAMOL 500 MG PO TABS
500.0000 mg | ORAL_TABLET | Freq: Four times a day (QID) | ORAL | Status: DC | PRN
  Filled 2012-01-13: qty 1

## 2012-01-13 MED ORDER — METHOCARBAMOL 100 MG/ML IJ SOLN
500.0000 mg | Freq: Four times a day (QID) | INTRAVENOUS | Status: DC | PRN
  Filled 2012-01-13: qty 5

## 2012-01-13 NOTE — ED Notes (Signed)
Waiting for room assignment.  Patient resting at this time

## 2012-01-13 NOTE — Progress Notes (Signed)
VASCULAR LAB PRELIMINARY  PRELIMINARY  PRELIMINARY  PRELIMINARY  Carotid duplex  completed.    Preliminary report:  Right:  60-79% internal carotid artery stenosis by velocity.  May be partially due to compensatory flow.  Left:  Greater than 80% internal carotid artery stenosis.    Bilateral:  Vertebral artery flow is antegrade.     Khyran Riera, RVT 01/13/2012, 12:12 PM

## 2012-01-13 NOTE — Progress Notes (Signed)
Admitted from ED, case of fall and hip fracture, noticeable right leg shorter than left leg, on IV fluids, on O2 support , face mask at 5lpm.  Pt in pain but not in respiratory distress. Pt alert and oriented x 4.  VS stable, see assessment charting.  Oriented to unit policies and routines.  Instructed to call for concerns and assistance.  Hooked to monitor for every hour vital signs and neuro checks.  Will continue to monitor pt.

## 2012-01-13 NOTE — Progress Notes (Addendum)
[facility] PROGRESS NOTE  [name]:[id number] DOB: [birth year] DOA: 01/12/2012 PCP: Carollee Herter, MD  Assessment/Plan: Right hip fracture -Orthopedics has been consulted Hypoxemia -check D-Dimer -place on supplemental oxygen -50pk yr hx tobacco Right bundle-branch block -No previous workup for the patient's family -consulted Cardiology for pre-op clearance given other CV risk factors -Echo ordered History of stroke with right hemiparesis -No active TIA symptoms at this time -Currently on Plavix Carotid stenosis -Ultrasound ordered Hypertension -Continue home medications- amlodipine, lisinopril -Beta blockade started last night DVT prophylaxis -SCDs for now   Procedures/Studies: Dg Chest 1 View  01/12/2012  *RADIOLOGY REPORT*  Clinical Data: Fall, right hip pain  CHEST - 1 VIEW  Comparison: 10/19/2009  Findings: Increased interstitial markings, possibly chronic. Bibasilar atelectasis.  No pleural effusion or pneumothorax.  The heart is normal in size.  IMPRESSION: No evidence of acute cardiopulmonary disease.   Original Report Authenticated By: Charline Bills, M.D.    Dg Hip Complete Right  01/12/2012  *RADIOLOGY REPORT*  Clinical Data: Fall, right hip pain  RIGHT HIP - COMPLETE 2+ VIEW  Comparison: None.  Findings: Subcapital right hip fracture.  Secondary varus angulation.  Left hip is unremarkable.  Degenerative changes of the lower lumbar spine.  IMPRESSION: Subcapital right hip fracture.   Original Report Authenticated By: Charline Bills, M.D.    Dg Knee Right Port  01/13/2012  *RADIOLOGY REPORT*  Clinical Data: 78 year old male with hip fracture.  Pain.  PORTABLE RIGHT KNEE - 1-2 VIEW  Comparison: Right hip series 01/12/2012.  Findings: Portable AP and cross-table lateral views of the right knee.  No joint effusion.  Mild for age joint space loss.  Patella appears intact.  No acute fracture identified about the right knee. Calcified atherosclerosis  in the right lower extremity.  IMPRESSION: No acute fracture or dislocation identified about the right knee.   Original Report Authenticated By: Harley Hallmark, M.D.       Code Status: Full Family Communication: Discussed with wife over telephone.  Total time spent 60 min. Disposition Plan: Home when medically stable  Subjective: Patient is resting comfortably. Denies any chest pain, shortness breath, nausea, vomiting, headache, visual changes, worsening weakness. No fevers or chills  Objective: Filed Vitals:   01/13/12 0400 01/13/12 0500 01/13/12 0600 01/13/12 0700  BP: 152/56 147/87 134/45 149/65  Pulse: 85 82 82 82  Temp: 98.6 F (37 C) 98.6 F (37 C) 98.5 F (36.9 C) 97.9 F (36.6 C)  TempSrc: Oral Oral Oral Oral  Resp: 20 21 20 18   Height:      Weight:      SpO2: 95% 94% 92% 94%    Intake/Output Summary (Last 24 hours) at 01/13/12 0913 Last data filed at 01/13/12 8119  Gross per 24 hour  Intake    240 ml  Output      0 ml  Net    240 ml   Weight change:  Exam:   General:  Pt is alert, follows commands appropriately, not in acute distress  HEENT: No icterus, No thrush, [place]/AT  Cardiovascular: irregular, S1/S2, , no rubs, no gallops  Respiratory: Bibasilar rales. No wheezes or rhonchi. Good air movt  Abdomen: Soft, non tender, non distended, bowel sounds present, no guarding  Extremities: 1+ edema right lower extremity. Left lower extremity no edema.  Data Reviewed: Basic Metabolic Panel:  Lab 01/13/12 1478 01/12/12 1934  NA 140 141  K 4.0 3.7  CL 104 104  CO2 24 26  GLUCOSE 110*  120*  BUN 13 14  CREATININE 0.75 0.92  CALCIUM 8.6 9.3  MG -- --  PHOS -- --   Liver Function Tests: No results found for this basename: AST:5,ALT:5,ALKPHOS:5,BILITOT:5,PROT:5,ALBUMIN:5 in the last 168 hours No results found for this basename: LIPASE:5,AMYLASE:5 in the last 168 hours No results found for this basename: AMMONIA:5 in the last 168 hours CBC:  Lab  01/13/12 0730 01/12/12 1934  WBC 14.9* 18.8*  NEUTROABS -- 16.2*  HGB 15.3 15.6  HCT 43.6 43.1  MCV 94.4 93.5  PLT 182 206   Cardiac Enzymes: No results found for this basename: CKTOTAL:5,CKMB:5,CKMBINDEX:5,TROPONINI:5 in the last 168 hours BNP: No components found with this basename: POCBNP:5 CBG: No results found for this basename: GLUCAP:5 in the last 168 hours  No results found for this or any previous visit (from the past 240 hour(s)).   Scheduled Meds:   . amLODipine  10 mg Oral q morning - 10a  . antiseptic oral rinse  15 mL Mouth Rinse BID  . baclofen  10 mg Oral TID  . lisinopril  10 mg Oral q morning - 10a  . metoprolol tartrate  12.5 mg Oral BID  . ondansetron (ZOFRAN) IV  4 mg Intravenous Once  . senna-docusate  2 tablet Oral QHS  . simvastatin  20 mg Oral QPM  . tiZANidine  2 mg Oral BID  . DISCONTD: acetaminophen  1,000 mg Intravenous Q6H   Continuous Infusions:   . sodium chloride 75 mL/hr at 01/13/12 0430     Zelig Gacek, DO  [facility] Pager [phone]  If 7PM-7AM, please contact night-coverage [email or web address] Password [facility] 01/13/2012, 9:13 AM   LOS: 1 day

## 2012-01-13 NOTE — Progress Notes (Signed)
Comfortable when still, sig pain with movement of R hip  AFVSS PE: no ecchymosis has developed over right hip  No change from examination last night No wheezing or retractions on room air  Xrays of knee negative for fracture  PLAN: 1.  Recommend R hip hemiarthroplasty for pain control and immediate WB, however, in nonoperative candidates that are not WB, pain control is an option without the skin risks associated with intertroch frxs 2. We await carotid study and hypoxia w/u; patient has eaten today so surgery may be an option tomorrow if cleared  Myrene Galas, MD [facility] [phone] (p)

## 2012-01-13 NOTE — Progress Notes (Signed)
  Echocardiogram 2D Echocardiogram has been performed.  Richard Kent FRANCES 01/13/2012, 5:10 PM

## 2012-01-13 NOTE — Consult Note (Signed)
Admit date: 01/12/2012 Referring Physician  Dr. Arbutus Leas Primary Physician  Dr. Sharlot Gowda Primary Cardiologist  Dr. Verne Carrow Reason for Consultation  Preoperative clearance for hip repair  HPI: This is an 78yo male with a history of RBBB and LAFB seen by Dr. Clifton James in consultation a year ago for preop clearance for hernia repair.  He underwent echo at that time which showed normal LVF.  He also has a history of HTN, carotid artery calcification with history of CVA 2 years ago.  He presented to [facility] after a mechanical fall.  He denies any syncope, chest pain, SOB or DOE.  On admission he was found to be hypoxic with O2 sats as low as 88%.  He is a former smoker.  His chest xray showed some interstitial disease.  He gets some exercise without any symptoms of chest pain or SOB.  We are now asked to consult for preoperative clearance.     PMH:   Past Medical History  Diagnosis Date  . Hypertension   . CVA (cerebral infarction) 2011  . Diverticulosis   . Smoker   . Carotid stenosis   . Kidney stone   . Stroke 11/17/09     PSH:   Past Surgical History  Procedure Date  . Hernia repair     RIGHT  . Appendectomy     Allergies:  Review of patient's allergies indicates no known allergies. Prior to Admit Meds:   Prescriptions prior to admission  Medication Sig Dispense Refill  . amLODipine (NORVASC) 10 MG tablet Take 10 mg by mouth every morning.      . Ascorbic Acid (VITAMIN C PO) Take 1 tablet by mouth every morning.       Marland Kitchen aspirin 81 MG tablet Take 81 mg by mouth every morning.       . baclofen (LIORESAL) 10 MG tablet Take 10 mg by mouth 3 (three) times daily.      . clopidogrel (PLAVIX) 75 MG tablet Take 75 mg by mouth every morning.      Marland Kitchen lisinopril (PRINIVIL,ZESTRIL) 10 MG tablet Take 10 mg by mouth every morning.      . Multiple Vitamin (MULTIVITAMIN WITH MINERALS) TABS Take 1 tablet by mouth every morning.      Bernadette Hoit Sodium (SENOKOT S PO) Take 2 tablets  by mouth at bedtime.       . simvastatin (ZOCOR) 20 MG tablet Take 20 mg by mouth every evening.      Marland Kitchen tiZANidine (ZANAFLEX) 2 MG tablet Take 2 mg by mouth 2 (two) times daily.       Fam HX:    Family History  Problem Relation Age of Onset  . Cancer Mother   . Stroke Mother   . Heart disease Father    Social HX:    History   Social History  . Marital Status: Married    Spouse Name: N/A    Number of Children: N/A  . Years of Education: N/A   Occupational History  . Not on file.   Social History Main Topics  . Smoking status: Former Smoker -- 2.0 packs/day for 20 years    Types: Cigarettes    Quit date: 01/13/1972  . Smokeless tobacco: Never Used  . Alcohol Use: No  . Drug Use: No  . Sexually Active: Not Currently   Other Topics Concern  . Not on file   Social History Narrative  . No narrative on file     ROS:  All 11 ROS were addressed and are negative except what is stated in the HPI  Physical Exam: Blood pressure 149/65, pulse 82, temperature 97.9 F (36.6 C), temperature source Oral, resp. rate 18, height 6' (1.829 m), weight 83.371 kg (183 lb 12.8 oz), SpO2 94.00%.    General: Well developed, well nourished, in no acute distress Head: Eyes PERRLA, No xanthomas.   Normal cephalic and atramatic  Lungs:   Clear bilaterally to auscultation and percussion. Heart:   HRRR S1 S2 Pulses are 2+ & equal.            No carotid bruit. No JVD.  No abdominal bruits. No femoral bruits. Abdomen: Bowel sounds are positive, abdomen soft and non-tender without masses Extremities:   No clubbing, cyanosis or edema.  DP +1 Neuro: Alert and oriented X 3. Psych:  Good affect, responds appropriately    Labs:   Lab Results  Component Value Date   WBC 14.9* 01/13/2012   HGB 15.3 01/13/2012   HCT 43.6 01/13/2012   MCV 94.4 01/13/2012   PLT 182 01/13/2012    Lab 01/13/12 0730  NA 140  K 4.0  CL 104  CO2 24  BUN 13  CREATININE 0.75  CALCIUM 8.6  PROT --  BILITOT --    ALKPHOS --  ALT --  AST --  GLUCOSE 110*   No results found for this basename: PTT   Lab Results  Component Value Date   INR 1.04 01/12/2012   INR 1.00 11/22/2011   INR 0.98 11/17/2009   Lab Results  Component Value Date   CKTOTAL 120 11/17/2009   CKMB 4.4* 11/17/2009   TROPONINI <0.30 11/22/2011     Lab Results  Component Value Date   CHOL 135 12/27/2010   CHOL  Value: 173        ATP III CLASSIFICATION:  <200     mg/dL   Desirable  161-096  mg/dL   Borderline High  >=045    mg/dL   High        08/28/8117   Lab Results  Component Value Date   HDL 47 12/27/2010   HDL 45 11/18/2009   Lab Results  Component Value Date   LDLCALC 68 12/27/2010   LDLCALC  Value: 108        Total Cholesterol/HDL:CHD Risk Coronary Heart Disease Risk Table                     Men   Women  1/2 Average Risk   3.4   3.3  Average Risk       5.0   4.4  2 X Average Risk   9.6   7.1  3 X Average Risk  23.4   11.0        Use the calculated Patient Ratio above and the CHD Risk Table to determine the patient's CHD Risk.        ATP III CLASSIFICATION (LDL):  <100     mg/dL   Optimal  147-829  mg/dL   Near or Above                    Optimal  130-159  mg/dL   Borderline  562-130  mg/dL   High  >865     mg/dL   Very High* 7/84/6962   Lab Results  Component Value Date   TRIG 101 12/27/2010   TRIG 98 11/18/2009   Lab Results  Component Value Date  CHOLHDL 2.9 12/27/2010   CHOLHDL 3.8 11/18/2009   No results found for this basename: LDLDIRECT      Radiology:  Dg Chest 1 View  01/12/2012  *RADIOLOGY REPORT*  Clinical Data: Fall, right hip pain  CHEST - 1 VIEW  Comparison: 10/19/2009  Findings: Increased interstitial markings, possibly chronic. Bibasilar atelectasis.  No pleural effusion or pneumothorax.  The heart is normal in size.  IMPRESSION: No evidence of acute cardiopulmonary disease.   Original Report Authenticated By: Charline Bills, M.D.    Dg Hip Complete Right  01/12/2012  *RADIOLOGY REPORT*  Clinical Data: Fall,  right hip pain  RIGHT HIP - COMPLETE 2+ VIEW  Comparison: None.  Findings: Subcapital right hip fracture.  Secondary varus angulation.  Left hip is unremarkable.  Degenerative changes of the lower lumbar spine.  IMPRESSION: Subcapital right hip fracture.   Original Report Authenticated By: Charline Bills, M.D.    Dg Knee Right Port  01/13/2012  *RADIOLOGY REPORT*  Clinical Data: 78 year old male with hip fracture.  Pain.  PORTABLE RIGHT KNEE - 1-2 VIEW  Comparison: Right hip series 01/12/2012.  Findings: Portable AP and cross-table lateral views of the right knee.  No joint effusion.  Mild for age joint space loss.  Patella appears intact.  No acute fracture identified about the right knee. Calcified atherosclerosis in the right lower extremity.  IMPRESSION: No acute fracture or dislocation identified about the right knee.   Original Report Authenticated By: Harley Hallmark, M.D.     EKG:  NSR with PAC's and RBBB  ASSESSMENT:  1.  Hip fracture 2.  Hypoxia of ? Etiology 3.  Chronic RBBB with no history of Chest pain or SOB with exertion or at rest 4.  History of remote CVA with calcified carotid arteries - carotid dopplers pending  PLAN:   1.  Check ddimer or chest CT to rule out PE given hypoxia 2.  2D echo pending - if LVF is normal then would consider patient low risk from cardiac standpoint to undergo surgery but need to determine etiology of hypoxia first.  Quintella Reichert, MD  01/13/2012  10:05 AM

## 2012-01-14 DIAGNOSIS — I63239 Cerebral infarction due to unspecified occlusion or stenosis of unspecified carotid arteries: Secondary | ICD-10-CM

## 2012-01-14 DIAGNOSIS — I6523 Occlusion and stenosis of bilateral carotid arteries: Secondary | ICD-10-CM

## 2012-01-14 DIAGNOSIS — J189 Pneumonia, unspecified organism: Secondary | ICD-10-CM

## 2012-01-14 LAB — CBC WITH DIFFERENTIAL/PLATELET
Basophils Relative: 0 % (ref 0–1)
Eosinophils Absolute: 0.2 10*3/uL (ref 0.0–0.7)
HCT: 40 % (ref 39.0–52.0)
Hemoglobin: 14 g/dL (ref 13.0–17.0)
Lymphs Abs: 0.8 10*3/uL (ref 0.7–4.0)
MCH: 33.1 pg (ref 26.0–34.0)
MCHC: 35 g/dL (ref 30.0–36.0)
Monocytes Absolute: 1.3 10*3/uL — ABNORMAL HIGH (ref 0.1–1.0)
Monocytes Relative: 10 % (ref 3–12)
Neutro Abs: 10.2 10*3/uL — ABNORMAL HIGH (ref 1.7–7.7)
Neutrophils Relative %: 81 % — ABNORMAL HIGH (ref 43–77)
RBC: 4.23 MIL/uL (ref 4.22–5.81)

## 2012-01-14 LAB — BASIC METABOLIC PANEL
BUN: 18 mg/dL (ref 6–23)
Chloride: 103 mEq/L (ref 96–112)
Glucose, Bld: 103 mg/dL — ABNORMAL HIGH (ref 70–99)
Potassium: 3.9 mEq/L (ref 3.5–5.1)
Sodium: 136 mEq/L (ref 135–145)

## 2012-01-14 MED ORDER — CEFTRIAXONE SODIUM 1 G IJ SOLR
1.0000 g | INTRAMUSCULAR | Status: DC
  Administered 2012-01-14 – 2012-01-18 (×5): 1 g via INTRAVENOUS
  Filled 2012-01-14 (×5): qty 10

## 2012-01-14 MED ORDER — DEXTROSE 5 % IV SOLN
500.0000 mg | INTRAVENOUS | Status: DC
  Administered 2012-01-14 – 2012-01-16 (×3): 500 mg via INTRAVENOUS
  Filled 2012-01-14 (×3): qty 500

## 2012-01-14 NOTE — Consult Note (Signed)
[facility]  CC: R hip pain s/p fall Requesting: Linwood Dibbles, EDP  HPI:   78 y/o male with h/o significant carotid stenosis (do not see studies indicating degree) and CVA with severe impact to R side of body, sustained a ground level fall earlier this evening. Brought to [place] where he was found to have a R subcapital femoral neck fracture. Ortho consulted for eval.   Pt in ED with family at bedside. C/o R hip pain, on NRB. Denies acute motor or sensory changes Pt denies injuries elsewhere.  Sounds as if pt sustained a mechanical fall. Denies blacking out prior to, during or after fall   Past Medical History  Diagnosis Date  . Hypertension   . CVA (cerebral infarction) 2011  . Diverticulosis   . Smoker   . Carotid stenosis   . Kidney stone   . Stroke 11/17/09   Past Surgical History  Procedure Date  . Hernia repair     RIGHT  . Appendectomy    Family History  Problem Relation Age of Onset  . Cancer Mother   . Stroke Mother   . Heart disease Father    History   Social History  . Marital Status: Married    Spouse Name: N/A    Number of Children: N/A  . Years of Education: N/A   Occupational History  . Not on file.   Social History Main Topics  . Smoking status: Former Smoker -- 2.0 packs/day for 20 years    Types: Cigarettes    Quit date: 01/13/1972  . Smokeless tobacco: Never Used  . Alcohol Use: No  . Drug Use: No  . Sexually Active: Not Currently   Other Topics Concern  . Not on file   Social History Narrative  . No narrative on file    Physical Examination:   General appearance - oriented to person, place, and time and on supplemental O2 Chest - coarse BS Heart - s1 and s2 Abdomen - soft, nontender, + BS Musculoskeletal -       Right Lower Extremity   R leg is short and ER, slightly abducted  Pain with manipulation of R hip   R ankle, foot with equinus deformity, but can passively get ankle to neutral without much  dificulty  Sensation intact distally   Motor grossly intact within limits distally as well   + pulse noted to R leg  Skin over hip is stable, without abrasion or significant bruising  Xray   Displaced R subcapital femoral neck fx  A/P  78 y/o male with significant medical comorbidities with R femoral neck fracture   1. Displaced R subcapital femoral neck fracture   Standard of care would be fixation- hemiarthroplasty   Family expresses concerns regarding carotid stenosis as pts stroke occurred after hernia repair, which led to the discovery of his stenosis   We recommend fixation if medically stable and family desires. It is possible to treat non-op but it would take several months to heal if in fact it does   Await medical clearance  2. Medical issues per medical service  Mearl Latin, PA-C [facility] [phone] (P) 01/14/2012 11:11 AM

## 2012-01-14 NOTE — Progress Notes (Signed)
SUBJECTIVE:  No new complaints  OBJECTIVE:   Vitals:   Filed Vitals:   01/13/12 2355 01/14/12 0400 01/14/12 0512 01/14/12 0800  BP:   145/51   Pulse:   68   Temp:   99 F (37.2 C)   TempSrc:   Oral   Resp: 16 16 18 18   Height:      Weight:   85.367 kg (188 lb 3.2 oz)   SpO2:   94% 96%   I&O's:   Intake/Output Summary (Last 24 hours) at 01/14/12 0846 Last data filed at 01/13/12 2100  Gross per 24 hour  Intake    290 ml  Output    675 ml  Net   -385 ml   TELEMETRY: Reviewed telemetry pt in NSR     PHYSICAL EXAM General: Well developed, well nourished, in no acute distress Head: Eyes PERRLA, No xanthomas.   Normal cephalic and atramatic  Lungs:   Clear bilaterally to auscultation and percussion. Heart:   HRRR S1 S2 Pulses are 2+ & equal. Abdomen: Bowel sounds are positive, abdomen soft and non-tender without masses  Extremities:   No clubbing, cyanosis or edema.  DP +1 Neuro: Alert and oriented X 3. Psych:  Good affect, responds appropriately   LABS: Basic Metabolic Panel:  Basename 01/14/12 0638 01/13/12 0730  NA 136 140  K 3.9 4.0  CL 103 104  CO2 23 24  GLUCOSE 103* 110*  BUN 18 13  CREATININE 0.85 0.75  CALCIUM 8.4 8.6  MG -- --  PHOS -- --   Liver Function Tests: No results found for this basename: AST:2,ALT:2,ALKPHOS:2,BILITOT:2,PROT:2,ALBUMIN:2 in the last 72 hours No results found for this basename: LIPASE:2,AMYLASE:2 in the last 72 hours CBC:  Basename 01/14/12 0638 01/13/12 0730 01/12/12 1934  WBC 12.6* 14.9* --  NEUTROABS 10.2* -- 16.2*  HGB 14.0 15.3 --  HCT 40.0 43.6 --  MCV 94.6 94.4 --  PLT 161 182 --   Cardiac Enzymes: No results found for this basename: CKTOTAL:3,CKMB:3,CKMBINDEX:3,TROPONINI:3 in the last 72 hours BNP: No components found with this basename: POCBNP:3 D-Dimer:  Basename 01/13/12 0911  DDIMER 9.86*    Lab Results  Component Value Date   INR 1.04 01/12/2012   INR 1.00 11/22/2011   INR 0.98 11/17/2009     RADIOLOGY: Dg Chest 1 View  01/12/2012  *RADIOLOGY REPORT*  Clinical Data: Fall, right hip pain  CHEST - 1 VIEW  Comparison: 10/19/2009  Findings: Increased interstitial markings, possibly chronic. Bibasilar atelectasis.  No pleural effusion or pneumothorax.  The heart is normal in size.  IMPRESSION: No evidence of acute cardiopulmonary disease.   Original Report Authenticated By: Charline Bills, M.D.    Dg Hip Complete Right  01/12/2012  *RADIOLOGY REPORT*  Clinical Data: Fall, right hip pain  RIGHT HIP - COMPLETE 2+ VIEW  Comparison: None.  Findings: Subcapital right hip fracture.  Secondary varus angulation.  Left hip is unremarkable.  Degenerative changes of the lower lumbar spine.  IMPRESSION: Subcapital right hip fracture.   Original Report Authenticated By: Charline Bills, M.D.    Ct Angio Chest Pe W/cm &/or Wo Cm  01/13/2012  *RADIOLOGY REPORT*  Clinical Data: 78 year old male.  Right side weakness.  Right hip fracture.  Hypoxia.  CT ANGIOGRAPHY CHEST  Technique:  Multidetector CT imaging of the chest using the standard protocol during bolus administration of intravenous contrast. Multiplanar reconstructed images including MIPs were obtained and reviewed to evaluate the vascular anatomy.  Contrast:  100 ml Omnipaque three-phase  D.  Comparison: Chest radiographs 01/12/2012 and earlier.  Findings: Adequate contrast bolus timing in the pulmonary arterial tree.  No focal filling defect identified in the pulmonary arterial tree to suggest the presence of acute pulmonary embolism.  Atelectatic changes to the major airways.  Bilateral lower lobe medial basal segment consolidation with air bronchograms, greater on the left.  Trace bilateral pleural effusions.  Superimposed dependent atelectasis in the upper lobes.  Cardiomegaly.  No pericardial effusion.  No mediastinal lymphadenopathy.  Negative thoracic inlet.  Negative visualized aorta and great vessels except for atherosclerosis.  There is  coronary artery atherosclerosis.  Negative visualized upper abdominal viscera.  No acute osseous abnormality identified.  IMPRESSION: 1. No evidence of acute pulmonary embolus. 2.  Bilateral lower lobe pneumonia with trace pleural fluid.   Original Report Authenticated By: Harley Hallmark, M.D.    Dg Knee Right Port  01/13/2012  *RADIOLOGY REPORT*  Clinical Data: 78 year old male with hip fracture.  Pain.  PORTABLE RIGHT KNEE - 1-2 VIEW  Comparison: Right hip series 01/12/2012.  Findings: Portable AP and cross-table lateral views of the right knee.  No joint effusion.  Mild for age joint space loss.  Patella appears intact.  No acute fracture identified about the right knee. Calcified atherosclerosis in the right lower extremity.  IMPRESSION: No acute fracture or dislocation identified about the right knee.   Original Report Authenticated By: Harley Hallmark, M.D.       ASSESSMENT:  1. Hip fracture  2. Hypoxia of most likely due to pneumonia now on antibiotics 3. Chronic RBBB with no history of Chest pain or SOB with exertion or at rest.  2D echo showed normal LVF EF 60-65% with grade II diastolic dysfunction 4. History of remote CVA with calcified carotid arteries - carotid dopplers showed greater than 80% stenosis of left carotid artery and 60-80% right.   PLAN:   1.  No further cardiac workup indicated preop.   2.  Given his significant carotid artery disease recommend vascular surgery consult prior to proceeding with surgery  Quintella Reichert, MD  01/14/2012  8:46 AM

## 2012-01-14 NOTE — Consult Note (Signed)
[facility] [place]   Vascular Consult Note    Patient name: Richard Kent MRN: [id number] DOB: 04-21-1929 Sex: male   Referred by: Hospitalist service  Reason for referral:BilLateral carotid disease  HISTORY OF PRESENT ILLNESS: Patient is a very pleasant 78 year old gentleman who is admitted for a fall resulting hip fracture. Medical clearance was obtained and he did have a carotid duplex. I've been asked to see to address his carotid disease and that this puts him at increased risk for hip repair. He does have a history of a major stroke 2 years ago with resulting right sided weakness and a fascia. He has had some improvement but still has marked disability in both his right arm and right leg. He is able to transfer and walk a slight amount with a walker and is in a wheelchair when he is out and about he denies any neurologic deficits prior to his stroke 2 years ago has had no change since that time. He has had no left-sided weakness.  Past Medical History  Diagnosis Date  . Hypertension   . CVA (cerebral infarction) 2011  . Diverticulosis   . Smoker   . Carotid stenosis   . Kidney stone   . Stroke 11/17/09    Past Surgical History  Procedure Date  . Hernia repair     RIGHT  . Appendectomy     History   Social History  . Marital Status: Married    Spouse Name: N/A    Number of Children: N/A  . Years of Education: N/A   Occupational History  . Not on file.   Social History Main Topics  . Smoking status: Former Smoker -- 2.0 packs/day for 20 years    Types: Cigarettes    Quit date: 01/13/1972  . Smokeless tobacco: Never Used  . Alcohol Use: No  . Drug Use: No  . Sexually Active: Not Currently   Other Topics Concern  . Not on file   Social History Narrative  . No narrative on file    Family History  Problem Relation Age of Onset  . Cancer Mother   . Stroke Mother   . Heart disease Father     No Known  Allergies  Prior to Admission medications   Medication Sig Start Date End Date Taking? Authorizing Provider  amLODipine (NORVASC) 10 MG tablet Take 10 mg by mouth every morning.   Yes Historical Provider, MD  Ascorbic Acid (VITAMIN C PO) Take 1 tablet by mouth every morning.    Yes Historical Provider, MD  aspirin 81 MG tablet Take 81 mg by mouth every morning.    Yes Historical Provider, MD  baclofen (LIORESAL) 10 MG tablet Take 10 mg by mouth 3 (three) times daily.   Yes Historical Provider, MD  clopidogrel (PLAVIX) 75 MG tablet Take 75 mg by mouth every morning.   Yes Historical Provider, MD  lisinopril (PRINIVIL,ZESTRIL) 10 MG tablet Take 10 mg by mouth every morning.   Yes Historical Provider, MD  Multiple Vitamin (MULTIVITAMIN WITH MINERALS) TABS Take 1 tablet by mouth every morning.   Yes Historical Provider, MD  Sennosides-Docusate Sodium (SENOKOT S PO) Take 2 tablets by mouth at bedtime.    Yes Historical Provider, MD  simvastatin (ZOCOR) 20 MG tablet Take 20 mg by mouth every evening.   Yes Historical Provider, MD  tiZANidine (ZANAFLEX) 2 MG tablet Take 2 mg by mouth 2 (two) times daily.   Yes  Historical Provider, MD     REVIEW OF SYSTEMS: Reviewed in his history and physical with nothing to add  PHYSICAL EXAMINATION:  Filed Vitals:   01/14/12 1023  BP: 148/76  Pulse:   Temp:   Resp:     General: The patient appears their stated age. Pulmonary: There is a good air exchange bilaterally without wheezing or rales. Abdomen: Soft and non-tender with normal pitch bowel sounds. Musculoskeletal: There are no major deformities.  There is no significant extremity pain. Neurologic: Minimal grip ability in his right hand. Can lift it off the bed against gravity slightly. A weakness in his right leg difficult to assess due to this fracture. Skin: There are no ulcer or rashes noted. Psychiatric: The patient has normal affect. Cardiovascular: There is a regular rate and rhythm without  significant murmur appreciated. Carotid arteries without bruits bilaterally Pulse status 2+ radial and femoral pulses. 2+ popliteal pulses bilaterally 1+ dorsalis pedis pulses bilaterally Diagnostic Studies: Carotid duplex reviewed. This did suggest a high-grade left carotid stenosis and moderate right carotid stenosis  I did review a CT angiogram from 2011 at the time that he had a left brain stroke. This does show complete occlusion of his left internal carotid artery  Medication Changes: None  Assessment:  Prior left brain stroke associated with left internal carotid artery occlusion. Recent duplex during this admission shows a suggested high-grade left carotid stenosis  Plan: Chronic left internal carotid artery occlusion since stroke in 2011. There is no contraindication to proceeding with needed orthopedic surgery. I suspect that the misinterpretation on the carotid duplex results from collateral flow. No need for further evaluation. Would recommend yearly carotid duplex for evaluation of moderate stable right internal carotid artery stenosis  Modesto Ganoe 8/25/201311:57 AM

## 2012-01-14 NOTE — Progress Notes (Signed)
[facility] PROGRESS NOTE  [name]:[id number] DOB: December 05, 1928 DOA: 01/12/2012 PCP: Carollee Herter, MD  Assessment/Plan: Right hip fracture  -appreciate ortho evaluation Pneumonia-CAP -blood cultures and cetriaxone/azithromycin Hypoxemia  -Likely combination of pneumonia and underlying COPD -50pk yr hx of tobacco Right bundle-branch block  -No previous workup per patient's family  -appreciate cardiology recommendations -discussed with Dr. Mayford Knife today History of stroke with right hemiparesis  -No active TIA symptoms at this time  -Restart Plavix if no surgery is planned--await consultants' input Carotid stenosis  -spoke with vascular, Dr. Kathleene Hazel eval pt Hypertension  -Continue home medications- amlodipine, lisinopril  -Beta blockade started last night  DVT prophylaxis  -SCDs for now   Procedures/Studies: Dg Chest 1 View  01/12/2012  *RADIOLOGY REPORT*  Clinical Data: Fall, right hip pain  CHEST - 1 VIEW  Comparison: 10/19/2009  Findings: Increased interstitial markings, possibly chronic. Bibasilar atelectasis.  No pleural effusion or pneumothorax.  The heart is normal in size.  IMPRESSION: No evidence of acute cardiopulmonary disease.   Original Report Authenticated By: Charline Bills, M.D.    Dg Hip Complete Right  01/12/2012  *RADIOLOGY REPORT*  Clinical Data: Fall, right hip pain  RIGHT HIP - COMPLETE 2+ VIEW  Comparison: None.  Findings: Subcapital right hip fracture.  Secondary varus angulation.  Left hip is unremarkable.  Degenerative changes of the lower lumbar spine.  IMPRESSION: Subcapital right hip fracture.   Original Report Authenticated By: Charline Bills, M.D.    Ct Angio Chest Pe W/cm &/or Wo Cm  01/13/2012  *RADIOLOGY REPORT*  Clinical Data: 78 year old male.  Right side weakness.  Right hip fracture.  Hypoxia.  CT ANGIOGRAPHY CHEST  Technique:  Multidetector CT imaging of the chest using the standard protocol during bolus administration  of intravenous contrast. Multiplanar reconstructed images including MIPs were obtained and reviewed to evaluate the vascular anatomy.  Contrast:  100 ml Omnipaque three-phase D.  Comparison: Chest radiographs 01/12/2012 and earlier.  Findings: Adequate contrast bolus timing in the pulmonary arterial tree.  No focal filling defect identified in the pulmonary arterial tree to suggest the presence of acute pulmonary embolism.  Atelectatic changes to the major airways.  Bilateral lower lobe medial basal segment consolidation with air bronchograms, greater on the left.  Trace bilateral pleural effusions.  Superimposed dependent atelectasis in the upper lobes.  Cardiomegaly.  No pericardial effusion.  No mediastinal lymphadenopathy.  Negative thoracic inlet.  Negative visualized aorta and great vessels except for atherosclerosis.  There is coronary artery atherosclerosis.  Negative visualized upper abdominal viscera.  No acute osseous abnormality identified.  IMPRESSION: 1. No evidence of acute pulmonary embolus. 2.  Bilateral lower lobe pneumonia with trace pleural fluid.   Original Report Authenticated By: Harley Hallmark, M.D.    Dg Knee Right Port  01/13/2012  *RADIOLOGY REPORT*  Clinical Data: 78 year old male with hip fracture.  Pain.  PORTABLE RIGHT KNEE - 1-2 VIEW  Comparison: Right hip series 01/12/2012.  Findings: Portable AP and cross-table lateral views of the right knee.  No joint effusion.  Mild for age joint space loss.  Patella appears intact.  No acute fracture identified about the right knee. Calcified atherosclerosis in the right lower extremity.  IMPRESSION: No acute fracture or dislocation identified about the right knee.   Original Report Authenticated By: Harley Hallmark, M.D.      Antibiotics:  Cetriaxone/azithromycin 8/25>>>   Code Status: Full Family Communication: updated wife and son Disposition Plan: Home when medically stable  Subjective: Pt. Complains  some pain in right hip.   Denies f/c, cp, sob, cough, hemoptysis, abdominal pain, dysuria, rashes, headache.  No n/v/d.  Objective: Filed Vitals:   01/13/12 2355 01/14/12 0400 01/14/12 0512 01/14/12 0800  BP:   145/51   Pulse:   68   Temp:   99 F (37.2 C)   TempSrc:   Oral   Resp: 16 16 18 18   Height:      Weight:   85.367 kg (188 lb 3.2 oz)   SpO2:   94% 96%    Intake/Output Summary (Last 24 hours) at 01/14/12 0848 Last data filed at 01/13/12 2100  Gross per 24 hour  Intake    290 ml  Output    675 ml  Net   -385 ml   Weight change: 1.996 kg (4 lb 6.4 oz) Exam:   General:  Pt is alert, follows commands appropriately, not in acute distress  HEENT: No icterus, No thrush, No neck mass, [place]/AT  Cardiovascular: Regular rate and rhythm, S1/S2, , no rubs, no gallops  Respiratory: bibasilar rales.  No wheeze or rhonchi. Good air movement  Abdomen: Soft, non tender, non distended, bowel sounds present, no guarding  Extremities: trace edema right leg, pulses DP and PT palpable bilaterally; no rashes or lymphangitis  Data Reviewed: Basic Metabolic Panel:  Lab 01/14/12 1610 01/13/12 0730 01/12/12 1934  NA 136 140 141  K 3.9 4.0 3.7  CL 103 104 104  CO2 23 24 26   GLUCOSE 103* 110* 120*  BUN 18 13 14   CREATININE 0.85 0.75 0.92  CALCIUM 8.4 8.6 9.3  MG -- -- --  PHOS -- -- --   Liver Function Tests: No results found for this basename: AST:5,ALT:5,ALKPHOS:5,BILITOT:5,PROT:5,ALBUMIN:5 in the last 168 hours No results found for this basename: LIPASE:5,AMYLASE:5 in the last 168 hours No results found for this basename: AMMONIA:5 in the last 168 hours CBC:  Lab 01/14/12 0638 01/13/12 0730 01/12/12 1934  WBC 12.6* 14.9* 18.8*  NEUTROABS 10.2* -- 16.2*  HGB 14.0 15.3 15.6  HCT 40.0 43.6 43.1  MCV 94.6 94.4 93.5  PLT 161 182 206   Cardiac Enzymes: No results found for this basename: CKTOTAL:5,CKMB:5,CKMBINDEX:5,TROPONINI:5 in the last 168 hours BNP: No components found with this basename:  POCBNP:5 CBG: No results found for this basename: GLUCAP:5 in the last 168 hours  No results found for this or any previous visit (from the past 240 hour(s)).   Scheduled Meds:   . amLODipine  10 mg Oral q morning - 10a  . antiseptic oral rinse  15 mL Mouth Rinse BID  . azithromycin  500 mg Intravenous Q24H  . baclofen  10 mg Oral TID  . cefTRIAXone (ROCEPHIN)  IV  1 g Intravenous Q24H  . lisinopril  10 mg Oral q morning - 10a  . metoprolol tartrate  12.5 mg Oral BID  . senna-docusate  2 tablet Oral QHS  . simvastatin  20 mg Oral QPM  . tiZANidine  2 mg Oral BID   Continuous Infusions:   . sodium chloride Stopped (01/13/12 1746)     Riana Tessmer, DO  [facility] Pager [phone]  If 7PM-7AM, please contact night-coverage [email or web address] Password [email or web address] 01/14/2012, 8:48 AM   LOS: 2 days

## 2012-01-14 NOTE — Progress Notes (Signed)
Feels well and denies perceiving a change in his pulmonary health despite pneumonia  Labs and CT reviewed. Started ceftriaxone/ azithro PE: no change in LEs, L ankle can passively extend to neutral with force and patient does use an anterior based AFO to assist with walking at home  PLAN: 1. Dr. Norris Cross card eval complete 2. Dr. Bosie Helper vascular eval pending 3. I will obtain anesth consult today; patient's procedure averages 2 hrs 4. If cleared and family chooses to proceed after discussion of all the risks, I would plan for tomorrow at 12pm; pre-op orders initiated 5. Appreciate continued management and coordination of Hospitalists Service.  Myrene Galas, MD [facility] [phone] (p)

## 2012-01-15 ENCOUNTER — Inpatient Hospital Stay (HOSPITAL_COMMUNITY): Payer: Medicare Other

## 2012-01-15 ENCOUNTER — Inpatient Hospital Stay (HOSPITAL_COMMUNITY): Payer: Medicare Other | Admitting: Anesthesiology

## 2012-01-15 ENCOUNTER — Encounter (HOSPITAL_COMMUNITY): Payer: Self-pay | Admitting: Anesthesiology

## 2012-01-15 ENCOUNTER — Encounter (HOSPITAL_COMMUNITY)
Admission: EM | Disposition: A | Discharge status: Rehabilitation Facility | Payer: Self-pay | Source: Home / Self Care | Attending: Internal Medicine

## 2012-01-15 HISTORY — PX: HIP ARTHROPLASTY: SHX981

## 2012-01-15 LAB — URINE CULTURE

## 2012-01-15 LAB — BASIC METABOLIC PANEL
BUN: 17 mg/dL (ref 6–23)
GFR calc Af Amer: >90 mL/min (ref >90)
GFR calc non Af Amer: 84 mL/min — ABNORMAL LOW (ref >90)
Potassium: 4 mEq/L (ref 3.5–5.1)

## 2012-01-15 LAB — CBC
Hemoglobin: 15 g/dL (ref 13.0–17.0)
MCHC: 35.8 g/dL (ref 30.0–36.0)
RDW: 13.9 % (ref 11.5–15.5)

## 2012-01-15 LAB — SURGICAL PCR SCREEN
MRSA, PCR: NEGATIVE
Staphylococcus aureus: NEGATIVE

## 2012-01-15 SURGERY — HEMIARTHROPLASTY, HIP, DIRECT ANTERIOR APPROACH, FOR FRACTURE
Anesthesia: General | Site: Hip | Laterality: Right | Wound class: Clean

## 2012-01-15 MED ORDER — HYDROMORPHONE HCL PF 1 MG/ML IJ SOLN
INTRAMUSCULAR | Status: AC
  Filled 2012-01-15: qty 1

## 2012-01-15 MED ORDER — ONDANSETRON HCL 4 MG/2ML IJ SOLN
INTRAMUSCULAR | Status: DC | PRN
  Administered 2012-01-15: 4 mg via INTRAVENOUS

## 2012-01-15 MED ORDER — PHENOL 1.4 % MT LIQD
1.0000 | OROMUCOSAL | Status: DC | PRN
  Filled 2012-01-15: qty 177

## 2012-01-15 MED ORDER — CEFAZOLIN SODIUM 1-5 GM-% IV SOLN
INTRAVENOUS | Status: DC | PRN
  Administered 2012-01-15: 1 g via INTRAVENOUS

## 2012-01-15 MED ORDER — LACTATED RINGERS IV SOLN
INTRAVENOUS | Status: DC | PRN
  Administered 2012-01-15: 14:00:00 via INTRAVENOUS

## 2012-01-15 MED ORDER — CEFAZOLIN SODIUM-DEXTROSE 2-3 GM-% IV SOLR
2.0000 g | Freq: Three times a day (TID) | INTRAVENOUS | Status: AC
  Administered 2012-01-16 (×2): 2 g via INTRAVENOUS
  Filled 2012-01-15 (×2): qty 50

## 2012-01-15 MED ORDER — ACETAMINOPHEN 325 MG PO TABS
650.0000 mg | ORAL_TABLET | Freq: Four times a day (QID) | ORAL | Status: DC | PRN
  Filled 2012-01-15: qty 2

## 2012-01-15 MED ORDER — ROCURONIUM BROMIDE 100 MG/10ML IV SOLN
INTRAVENOUS | Status: DC | PRN
  Administered 2012-01-15: 25 mg via INTRAVENOUS
  Administered 2012-01-15: 15 mg via INTRAVENOUS
  Administered 2012-01-15: 50 mg via INTRAVENOUS
  Administered 2012-01-15: 10 mg via INTRAVENOUS

## 2012-01-15 MED ORDER — ACETAMINOPHEN 10 MG/ML IV SOLN
INTRAVENOUS | Status: DC | PRN
  Administered 2012-01-15: 1000 mg via INTRAVENOUS

## 2012-01-15 MED ORDER — EPHEDRINE SULFATE 50 MG/ML IJ SOLN
INTRAMUSCULAR | Status: DC | PRN
  Administered 2012-01-15 (×2): 5 mg via INTRAVENOUS

## 2012-01-15 MED ORDER — GLYCOPYRROLATE 0.2 MG/ML IJ SOLN
INTRAMUSCULAR | Status: DC | PRN
  Administered 2012-01-15: 0.6 mg via INTRAVENOUS

## 2012-01-15 MED ORDER — CEFAZOLIN SODIUM 1-5 GM-% IV SOLN
INTRAVENOUS | Status: AC
  Filled 2012-01-15: qty 50

## 2012-01-15 MED ORDER — LIDOCAINE HCL (CARDIAC) 20 MG/ML IV SOLN
INTRAVENOUS | Status: DC | PRN
  Administered 2012-01-15: 80 mg via INTRAVENOUS

## 2012-01-15 MED ORDER — NEOSTIGMINE METHYLSULFATE 1 MG/ML IJ SOLN
INTRAMUSCULAR | Status: DC | PRN
  Administered 2012-01-15: 4 mg via INTRAVENOUS

## 2012-01-15 MED ORDER — ENOXAPARIN SODIUM 40 MG/0.4ML ~~LOC~~ SOLN
40.0000 mg | SUBCUTANEOUS | Status: DC
  Administered 2012-01-16 – 2012-01-18 (×3): 40 mg via SUBCUTANEOUS
  Filled 2012-01-15 (×4): qty 0.4

## 2012-01-15 MED ORDER — SODIUM CHLORIDE 0.9 % IR SOLN
Status: DC | PRN
  Administered 2012-01-15: 1000 mL

## 2012-01-15 MED ORDER — METOCLOPRAMIDE HCL 5 MG PO TABS
5.0000 mg | ORAL_TABLET | Freq: Three times a day (TID) | ORAL | Status: DC | PRN
  Filled 2012-01-15: qty 2

## 2012-01-15 MED ORDER — ACETAMINOPHEN 650 MG RE SUPP: 650.0000 mg | Freq: Four times a day (QID) | RECTAL | Status: DC | PRN

## 2012-01-15 MED ORDER — ONDANSETRON HCL 4 MG/2ML IJ SOLN: 4.0000 mg | Freq: Once | INTRAMUSCULAR | Status: DC | PRN

## 2012-01-15 MED ORDER — PROPOFOL 10 MG/ML IV EMUL
INTRAVENOUS | Status: DC | PRN
  Administered 2012-01-15: 100 mg via INTRAVENOUS

## 2012-01-15 MED ORDER — METOCLOPRAMIDE HCL 5 MG/ML IJ SOLN
5.0000 mg | Freq: Three times a day (TID) | INTRAMUSCULAR | Status: DC | PRN
  Filled 2012-01-15: qty 2

## 2012-01-15 MED ORDER — MUPIROCIN 2 % EX OINT
TOPICAL_OINTMENT | Freq: Once | CUTANEOUS | Status: AC
  Administered 2012-01-15: 1 via NASAL
  Filled 2012-01-15: qty 22

## 2012-01-15 MED ORDER — ACETAMINOPHEN 10 MG/ML IV SOLN
INTRAVENOUS | Status: AC
  Filled 2012-01-15: qty 100

## 2012-01-15 MED ORDER — ONDANSETRON HCL 4 MG PO TABS: 4.0000 mg | ORAL_TABLET | Freq: Four times a day (QID) | ORAL | Status: DC | PRN

## 2012-01-15 MED ORDER — MENTHOL 3 MG MT LOZG
1.0000 | LOZENGE | OROMUCOSAL | Status: DC | PRN
  Filled 2012-01-15: qty 9

## 2012-01-15 MED ORDER — HYDROMORPHONE HCL PF 1 MG/ML IJ SOLN
0.2500 mg | INTRAMUSCULAR | Status: DC | PRN
  Administered 2012-01-15 (×4): 0.5 mg via INTRAVENOUS

## 2012-01-15 MED ORDER — LACTATED RINGERS IV SOLN
INTRAVENOUS | Status: DC
  Administered 2012-01-15 – 2012-01-16 (×2): via INTRAVENOUS

## 2012-01-15 MED ORDER — FENTANYL CITRATE 0.05 MG/ML IJ SOLN: 50.0000 ug | INTRAMUSCULAR | Status: DC | PRN

## 2012-01-15 MED ORDER — ONDANSETRON HCL 4 MG/2ML IJ SOLN: 4.0000 mg | Freq: Four times a day (QID) | INTRAMUSCULAR | Status: DC | PRN

## 2012-01-15 MED ORDER — FENTANYL CITRATE 0.05 MG/ML IJ SOLN
INTRAMUSCULAR | Status: DC | PRN
  Administered 2012-01-15 (×2): 50 ug via INTRAVENOUS
  Administered 2012-01-15: 100 ug via INTRAVENOUS
  Administered 2012-01-15 (×2): 50 ug via INTRAVENOUS

## 2012-01-15 SURGICAL SUPPLY — 71 items
BLADE SAW SAG 73X25 THK (BLADE) ×1
BLADE SAW SGTL 73X25 THK (BLADE) ×1 IMPLANT
BLADE SURG 10 STRL SS (BLADE) ×1 IMPLANT
BRUSH FEMORAL CANAL (MISCELLANEOUS) IMPLANT
BRUSH SCRUB DISP (MISCELLANEOUS) ×4 IMPLANT
CLOTH BEACON ORANGE TIMEOUT ST (SAFETY) ×2 IMPLANT
COVER BACK TABLE 24X17X13 BIG (DRAPES) IMPLANT
COVER SURGICAL LIGHT HANDLE (MISCELLANEOUS) ×2 IMPLANT
DRAPE C-ARMOR (DRAPES) ×1 IMPLANT
DRAPE INCISE IOBAN 66X45 STRL (DRAPES) ×2 IMPLANT
DRAPE INCISE IOBAN 85X60 (DRAPES) ×2 IMPLANT
DRAPE ORTHO SPLIT 77X108 STRL (DRAPES) ×4
DRAPE SURG ORHT 6 SPLT 77X108 (DRAPES) ×2 IMPLANT
DRAPE U-SHAPE 47X51 STRL (DRAPES) ×2 IMPLANT
DRILL BIT 7/64X5 (BIT) ×2 IMPLANT
DRSG ADAPTIC 3X8 NADH LF (GAUZE/BANDAGES/DRESSINGS) ×1 IMPLANT
DRSG MEPILEX BORDER 4X8 (GAUZE/BANDAGES/DRESSINGS) ×2 IMPLANT
DRSG PAD ABDOMINAL 8X10 ST (GAUZE/BANDAGES/DRESSINGS) IMPLANT
ELECT BLADE 6.5 EXT (BLADE) IMPLANT
ELECT CAUTERY BLADE 6.4 (BLADE) ×2 IMPLANT
ELECT REM PT RETURN 9FT ADLT (ELECTROSURGICAL) ×2
ELECTRODE REM PT RTRN 9FT ADLT (ELECTROSURGICAL) ×1 IMPLANT
EVACUATOR 1/8 PVC DRAIN (DRAIN) IMPLANT
GLOVE BIO SURGEON STRL SZ 6.5 (GLOVE) ×2 IMPLANT
GLOVE BIO SURGEON STRL SZ7 (GLOVE) ×2 IMPLANT
GLOVE BIO SURGEON STRL SZ7.5 (GLOVE) ×2 IMPLANT
GLOVE BIO SURGEON STRL SZ8 (GLOVE) ×2 IMPLANT
GLOVE BIOGEL PI IND STRL 7.0 (GLOVE) ×1 IMPLANT
GLOVE BIOGEL PI IND STRL 7.5 (GLOVE) ×1 IMPLANT
GLOVE BIOGEL PI IND STRL 8 (GLOVE) ×1 IMPLANT
GLOVE BIOGEL PI INDICATOR 7.0 (GLOVE) ×1
GLOVE BIOGEL PI INDICATOR 7.5 (GLOVE) ×1
GLOVE BIOGEL PI INDICATOR 8 (GLOVE) ×2
GOWN PREVENTION PLUS XLARGE (GOWN DISPOSABLE) ×2 IMPLANT
GOWN PREVENTION PLUS XXLARGE (GOWN DISPOSABLE) IMPLANT
GOWN SRG XL XLNG 56XLVL 4 (GOWN DISPOSABLE) IMPLANT
GOWN STRL NON-REIN LRG LVL3 (GOWN DISPOSABLE) ×4 IMPLANT
GOWN STRL NON-REIN XL XLG LVL4 (GOWN DISPOSABLE) ×2
HANDPIECE INTERPULSE COAX TIP (DISPOSABLE)
IMMOBILIZER KNEE 20 (SOFTGOODS)
IMMOBILIZER KNEE 20 THIGH 36 (SOFTGOODS) IMPLANT
IMMOBILIZER KNEE 22 UNIV (SOFTGOODS) IMPLANT
IMMOBILIZER KNEE 24 THIGH 36 (MISCELLANEOUS) IMPLANT
IMMOBILIZER KNEE 24 UNIV (MISCELLANEOUS)
KIT BASIN OR (CUSTOM PROCEDURE TRAY) ×2 IMPLANT
KIT ROOM TURNOVER OR (KITS) ×2 IMPLANT
MANIFOLD NEPTUNE II (INSTRUMENTS) ×2 IMPLANT
NDL 1/2 CIR MAYO (NEEDLE) IMPLANT
NEEDLE 1/2 CIR MAYO (NEEDLE) ×2 IMPLANT
NS IRRIG 1000ML POUR BTL (IV SOLUTION) ×2 IMPLANT
PACK TOTAL JOINT (CUSTOM PROCEDURE TRAY) ×2 IMPLANT
PAD ARMBOARD 7.5X6 YLW CONV (MISCELLANEOUS) ×4 IMPLANT
PASSER SUT SWANSON 36MM LOOP (INSTRUMENTS) ×1 IMPLANT
PILLOW ABDUCTION HIP (SOFTGOODS) ×2 IMPLANT
SET HNDPC FAN SPRY TIP SCT (DISPOSABLE) IMPLANT
SPONGE GAUZE 4X4 12PLY (GAUZE/BANDAGES/DRESSINGS) ×1 IMPLANT
SPONGE LAP 18X18 X RAY DECT (DISPOSABLE) ×2 IMPLANT
STAPLER VISISTAT 35W (STAPLE) ×2 IMPLANT
SUCTION FRAZIER TIP 10 FR DISP (SUCTIONS) ×2 IMPLANT
SUT FIBERWIRE #2 38 T-5 BLUE (SUTURE) ×6
SUT VIC AB 0 CT1 27 (SUTURE) ×2
SUT VIC AB 0 CT1 27XBRD ANBCTR (SUTURE) IMPLANT
SUT VIC AB 1 CTB1 27 (SUTURE) ×4 IMPLANT
SUT VIC AB 1 CTX 18 (SUTURE) ×1 IMPLANT
SUT VIC AB 2-0 CTB1 (SUTURE) ×4 IMPLANT
SUTURE FIBERWR #2 38 T-5 BLUE (SUTURE) ×3 IMPLANT
TOWEL OR 17X24 6PK STRL BLUE (TOWEL DISPOSABLE) ×2 IMPLANT
TOWEL OR 17X26 10 PK STRL BLUE (TOWEL DISPOSABLE) ×4 IMPLANT
TOWER CARTRIDGE SMART MIX (DISPOSABLE) IMPLANT
TRAY FOLEY CATH 14FR (SET/KITS/TRAYS/PACK) ×1 IMPLANT
WATER STERILE IRR 1000ML POUR (IV SOLUTION) ×4 IMPLANT

## 2012-01-15 NOTE — Progress Notes (Addendum)
Clinical Social Work Department BRIEF PSYCHOSOCIAL ASSESSMENT 01/15/2012  Patient:  Richard Kent, Richard Kent     Account Number:  [id number]     Admit date:  01/12/2012  Clinical Social Worker:  Juliette Mangle  Date/Time:  01/15/2012 03:00 PM  Referred by:  Physician  Date Referred:  01/14/2012 Referred for  SNF Placement   Other Referral:   Interview type:  Patient Other interview type:    PSYCHOSOCIAL DATA Living Status:  WIFE Admitted from facility:   Level of care:   Primary support name:  Gilford Rile Primary support relationship to patient:  SPOUSE Degree of support available:   Good    CURRENT CONCERNS Current Concerns  Post-Acute Placement   Other Concerns:    SOCIAL WORK ASSESSMENT / PLAN CSW received a referral for SNF placement. CSW met with patient at bedside. CSW introduced self, explained role and discussed going to a SNF for possible rehab. Patient is currently awaiting surgery.CSW explained the SNF process and provided the patient with a choice list.  Patient lives at home with his wife and reported that he would like to discuss placement with his wife Patient was agreeable to being faxed out in [facility]. CSW will continue to follow and will start paperwork for possible SNF Placement.    [name] reported that their first choice is [facility].   Assessment/plan status:  Psychosocial Support/Ongoing Assessment of Needs Other assessment/ plan:   Information/referral to community resources:   SNF choice list    PATIENT'S/FAMILY'S RESPONSE TO PLAN OF CARE: Patient and patient's wife were very appreciative of support and information provided by CSW. CSW will continue to follow and will assist with all d/c needs.    Sabino Niemann, MSW, [employer] [phone]

## 2012-01-15 NOTE — Preoperative (Signed)
Beta Blockers   Reason not to administer Beta Blockers:Not Applicable, given today at 1024

## 2012-01-15 NOTE — Progress Notes (Signed)
[facility] PROGRESS NOTE  [name]:[id number] DOB: 03-19-1929 DOA: 01/12/2012 PCP: Carollee Herter, MD  Assessment/Plan:  Right hip fracture  -cleared by cardiology and vascular surgery for ORIF  Pneumonia-CAP  -Continue cetriaxone/azithromycin day #2 Hypoxemia  -Stable/improving, continue supplemental oxygen -Likely combination of pneumonia and underlying COPD  -50pk yr hx of tobacco  Right bundle-branch block  -Discussed with Dr. Mayford Knife, no further workup at this time History of stroke with right hemiparesis  -No active TIA symptoms at this time  -Restart Plavix after surgery Carotid stenosis  -Appreciate Dr. Bosie Helper  recommendations Hypertension  -Continue home medications- amlodipine, lisinopril  -continue beta blockade DVT prophylaxis  -Will likely need rivaroxaban after ORIF of the hip if no contraindications   Procedures/Studies: Dg Chest 1 View  01/12/2012  *RADIOLOGY REPORT*  Clinical Data: Fall, right hip pain  CHEST - 1 VIEW  Comparison: 10/19/2009  Findings: Increased interstitial markings, possibly chronic. Bibasilar atelectasis.  No pleural effusion or pneumothorax.  The heart is normal in size.  IMPRESSION: No evidence of acute cardiopulmonary disease.   Original Report Authenticated By: Charline Bills, M.D.    Dg Hip Complete Right  01/12/2012  *RADIOLOGY REPORT*  Clinical Data: Fall, right hip pain  RIGHT HIP - COMPLETE 2+ VIEW  Comparison: None.  Findings: Subcapital right hip fracture.  Secondary varus angulation.  Left hip is unremarkable.  Degenerative changes of the lower lumbar spine.  IMPRESSION: Subcapital right hip fracture.   Original Report Authenticated By: Charline Bills, M.D.    Ct Angio Chest Pe W/cm &/or Wo Cm  01/13/2012  *RADIOLOGY REPORT*  Clinical Data: 78 year old male.  Right side weakness.  Right hip fracture.  Hypoxia.  CT ANGIOGRAPHY CHEST  Technique:  Multidetector CT imaging of the chest using the standard  protocol during bolus administration of intravenous contrast. Multiplanar reconstructed images including MIPs were obtained and reviewed to evaluate the vascular anatomy.  Contrast:  100 ml Omnipaque three-phase D.  Comparison: Chest radiographs 01/12/2012 and earlier.  Findings: Adequate contrast bolus timing in the pulmonary arterial tree.  No focal filling defect identified in the pulmonary arterial tree to suggest the presence of acute pulmonary embolism.  Atelectatic changes to the major airways.  Bilateral lower lobe medial basal segment consolidation with air bronchograms, greater on the left.  Trace bilateral pleural effusions.  Superimposed dependent atelectasis in the upper lobes.  Cardiomegaly.  No pericardial effusion.  No mediastinal lymphadenopathy.  Negative thoracic inlet.  Negative visualized aorta and great vessels except for atherosclerosis.  There is coronary artery atherosclerosis.  Negative visualized upper abdominal viscera.  No acute osseous abnormality identified.  IMPRESSION: 1. No evidence of acute pulmonary embolus. 2.  Bilateral lower lobe pneumonia with trace pleural fluid.   Original Report Authenticated By: Harley Hallmark, M.D.    Dg Knee Right Port  01/13/2012  *RADIOLOGY REPORT*  Clinical Data: 78 year old male with hip fracture.  Pain.  PORTABLE RIGHT KNEE - 1-2 VIEW  Comparison: Right hip series 01/12/2012.  Findings: Portable AP and cross-table lateral views of the right knee.  No joint effusion.  Mild for age joint space loss.  Patella appears intact.  No acute fracture identified about the right knee. Calcified atherosclerosis in the right lower extremity.  IMPRESSION: No acute fracture or dislocation identified about the right knee.   Original Report Authenticated By: Harley Hallmark, M.D.      Antibiotics:  Ceftriaxone/azithromycin August 25>>>   Code Status: Full   Subjective: Patient is doing well.  Pain is controlled. He denies any chest pain, shortness  breath, nausea, vomiting, abdominal pain, dysuria, fevers, chills  Objective: Filed Vitals:   01/15/12 0400 01/15/12 0527 01/15/12 1015 01/15/12 1026  BP:  150/61 126/77   Pulse:  70 59 56  Temp:  97.9 F (36.6 C) 97.9 F (36.6 C)   TempSrc:  Oral Oral   Resp: 94 18 20   Height:      Weight:  85.684 kg (188 lb 14.4 oz)    SpO2:  94% 93%     Intake/Output Summary (Last 24 hours) at 01/15/12 1041 Last data filed at 01/15/12 1025  Gross per 24 hour  Intake    290 ml  Output   1225 ml  Net   -935 ml   Weight change: 0.318 kg (11.2 oz) Exam:   General:  Pt is alert, follows commands appropriately, not in acute distress  HEENT: No icterus, No thrush,  [place]/AT  Cardiovascular: Regular rate and rhythm, S1/S2, , no rubs, no gallops  Respiratory: Bibasilar crackles. No wheezes or rhonchi. Left clear to auscultation  Abdomen: Soft, non tender, non distended, bowel sounds present, no guarding  Extremities: Trace edema right lower extremity, no lymphangitis, no rash pulses DP and PT palpable bilaterally  Data Reviewed: Basic Metabolic Panel:  Lab 01/15/12 1478 01/14/12 0638 01/13/12 0730 01/12/12 1934  NA 136 136 140 141  K 4.0 3.9 4.0 3.7  CL 102 103 104 104  CO2 24 23 24 26   GLUCOSE 91 103* 110* 120*  BUN 17 18 13 14   CREATININE 0.75 0.85 0.75 0.92  CALCIUM 8.9 8.4 8.6 9.3  MG -- -- -- --  PHOS -- -- -- --   Liver Function Tests: No results found for this basename: AST:5,ALT:5,ALKPHOS:5,BILITOT:5,PROT:5,ALBUMIN:5 in the last 168 hours No results found for this basename: LIPASE:5,AMYLASE:5 in the last 168 hours No results found for this basename: AMMONIA:5 in the last 168 hours CBC:  Lab 01/15/12 0550 01/14/12 0638 01/13/12 0730 01/12/12 1934  WBC 10.9* 12.6* 14.9* 18.8*  NEUTROABS -- 10.2* -- 16.2*  HGB 15.0 14.0 15.3 15.6  HCT 41.9 40.0 43.6 43.1  MCV 93.9 94.6 94.4 93.5  PLT 168 161 182 206   Cardiac Enzymes: No results found for this basename:  CKTOTAL:5,CKMB:5,CKMBINDEX:5,TROPONINI:5 in the last 168 hours BNP: No components found with this basename: POCBNP:5 CBG: No results found for this basename: GLUCAP:5 in the last 168 hours  Recent Results (from the past 240 hour(s))  CULTURE, BLOOD (ROUTINE X 2)     Status: Normal (Preliminary result)   Collection Time   01/14/12 10:50 AM      Component Value Range Status Comment   Specimen Description BLOOD RIGHT HAND   Final    Special Requests BOTTLES DRAWN AEROBIC ONLY 4CC   Final    Culture  Setup Time 01/14/2012 16:56   Final    Culture     Final    Value:        BLOOD CULTURE RECEIVED NO GROWTH TO DATE CULTURE WILL BE HELD FOR 5 DAYS BEFORE ISSUING A FINAL NEGATIVE REPORT   Report Status PENDING   Incomplete   CULTURE, BLOOD (ROUTINE X 2)     Status: Normal (Preliminary result)   Collection Time   01/14/12 11:05 AM      Component Value Range Status Comment   Specimen Description BLOOD LEFT HAND   Final    Special Requests BOTTLES DRAWN AEROBIC ONLY 6CC   Final  Culture  Setup Time 01/14/2012 16:56   Final    Culture     Final    Value:        BLOOD CULTURE RECEIVED NO GROWTH TO DATE CULTURE WILL BE HELD FOR 5 DAYS BEFORE ISSUING A FINAL NEGATIVE REPORT   Report Status PENDING   Incomplete      Scheduled Meds:   . amLODipine  10 mg Oral q morning - 10a  . antiseptic oral rinse  15 mL Mouth Rinse BID  . azithromycin  500 mg Intravenous Q24H  . baclofen  10 mg Oral TID  . cefTRIAXone (ROCEPHIN)  IV  1 g Intravenous Q24H  . lisinopril  10 mg Oral q morning - 10a  . metoprolol tartrate  12.5 mg Oral BID  . senna-docusate  2 tablet Oral QHS  . simvastatin  20 mg Oral QPM  . tiZANidine  2 mg Oral BID   Continuous Infusions:    Aniyah Nobis, DO  [facility] Pager [phone]  If 7PM-7AM, please contact night-coverage [email or web address] Password [email or web address] 01/15/2012, 10:41 AM   LOS: 3 days

## 2012-01-15 NOTE — Progress Notes (Signed)
Clinical Social Work Department CLINICAL SOCIAL WORK PLACEMENT NOTE 01/15/2012  Patient:  Richard Kent, Richard Kent  Account Number:  [id number] Admit date:  01/12/2012  Clinical Social Worker:  Jerrilynn Mikowski Lubertha Basque  Date/time:  01/15/2012 04:16 PM  Clinical Social Work is seeking post-discharge placement for this patient at the following level of care:   SKILLED NURSING   (*CSW will update this form in Epic as items are completed)   01/15/2012  Patient/family provided with [facility] Department of Clinical Social Work's list of facilities offering this level of care within the geographic area requested by the patient (or if unable, by the patient's family).  01/15/2012  Patient/family informed of their freedom to choose among providers that offer the needed level of care, that participate in Medicare, Medicaid or managed care program needed by the patient, have an available bed and are willing to accept the patient.  01/15/2012  Patient/family informed of [facility]' ownership interest in [facility], as well as of the fact that they are under no obligation to receive care at this facility.  PASARR submitted to EDS on 01/15/2012 PASARR number received from [employer] on 01/15/2012  FL2 transmitted to all facilities in geographic area requested by pt/family on  01/15/2012 FL2 transmitted to all facilities within larger geographic area on   Patient informed that his/her managed care company has contracts with or will negotiate with  certain facilities, including the following:     Patient/family informed of bed offers received:   Patient chooses bed at  Physician recommends and patient chooses bed at    Patient to be transferred to  on   Patient to be transferred to facility by   The following physician request were entered in Epic:   Additional Comments: Patient and patient's wife want a bed at [facility], MSW, [place] [phone]

## 2012-01-15 NOTE — Brief Op Note (Signed)
01/12/2012 - 01/15/2012  4:37 PM  PATIENT:  [name]  78 y.o. male  PRE-OPERATIVE DIAGNOSIS:  Right displaced femoral neck fracture  POST-OPERATIVE DIAGNOSIS:  Right displaced femoral neck fracture  PROCEDURE:  Procedure(s) (LRB): R hip hemiARTHROPLASTY unipolar with DePuy Summit basic, #6 femur, -3 neck, 54 mm head  SURGEON:  Surgeon(s) and Role:    * Budd Palmer, MD - Primary  PHYSICIAN ASSISTANT: Montez Morita, [facility]  ANESTHESIA:   general  EBL:  Total I/O In: 0  Out: 905 [Urine:805; Blood:100]  BLOOD ADMINISTERED:none  DRAINS: none   LOCAL MEDICATIONS USED:  NONE  SPECIMEN:  No Specimen  DISPOSITION OF SPECIMEN:  N/A  COUNTS:  YES  TOURNIQUET:  * No tourniquets in log *  DICTATION: .Other Dictation: Dictation Number [id number]  PLAN OF CARE: Admit to inpatient   PATIENT DISPOSITION:  PACU - hemodynamically stable.   Delay start of Pharmacological VTE agent (>24hrs) due to surgical blood loss or risk of bleeding: no

## 2012-01-15 NOTE — Transfer of Care (Signed)
Immediate Anesthesia Transfer of Care Note  Patient: Richard Kent  Procedure(s) Performed: Procedure(s) (LRB): ARTHROPLASTY BIPOLAR HIP (Right)  Patient Location: PACU  Anesthesia Type: General  Level of Consciousness: oriented, patient cooperative and responds to stimulation  Airway & Oxygen Therapy: Patient Spontanous Breathing and Patient connected to nasal cannula oxygen  Post-op Assessment: Report given to PACU RN and Post -op Vital signs reviewed and stable  Post vital signs: Reviewed and stable  Complications: No apparent anesthesia complications

## 2012-01-15 NOTE — Anesthesia Postprocedure Evaluation (Signed)
  Anesthesia Post-op Note  Patient: Richard Kent  Procedure(s) Performed: Procedure(s) (LRB): ARTHROPLASTY BIPOLAR HIP (Right)  Patient Location: PACU  Anesthesia Type: General  Level of Consciousness: awake, alert  and oriented  Airway and Oxygen Therapy: Patient Spontanous Breathing and Patient connected to nasal cannula oxygen  Post-op Pain: none  Post-op Assessment: Post-op Vital signs reviewed  Post-op Vital Signs: Reviewed  Complications: No apparent anesthesia complications

## 2012-01-15 NOTE — Progress Notes (Signed)
Orthopaedic Trauma [facility]  Subjective:  No complaints this am Pt states that he and his family have decided to proceed with surgery Reviewed Dr. Bosie Helper note  R hip not hurting  Objective: Vital signs in last 24 hours: Temp:  [97.9 F (36.6 C)-98.9 F (37.2 C)] 97.9 F (36.6 C) (08/26 0527) Pulse Rate:  [59-70] 70  (08/26 0527) Resp:  [16-94] 18  (08/26 0527) BP: (138-150)/(56-76) 150/61 mmHg (08/26 0527) SpO2:  [92 %-96 %] 94 % (08/26 0527) Weight:  [85.684 kg (188 lb 14.4 oz)] 85.684 kg (188 lb 14.4 oz) (08/26 0527)  Intake/Output from previous day: 08/25 0701 - 08/26 0700 In: 530 [P.O.:480; IV Piggyback:50] Out: 1100 [Urine:1100] Intake/Output this shift: Total I/O In: 0  Out: 100 [Urine:100]   Basename 01/15/12 0550 01/14/12 0638 01/13/12 0730 01/12/12 1934  HGB 15.0 14.0 15.3 15.6    Basename 01/15/12 0550 01/14/12 0638  WBC 10.9* 12.6*  RBC 4.46 4.23  HCT 41.9 40.0  PLT 168 161    Basename 01/15/12 0550 01/14/12 0638  NA 136 136  K 4.0 3.9  CL 102 103  CO2 24 23  BUN 17 18  CREATININE 0.75 0.85  GLUCOSE 91 103*  CALCIUM 8.9 8.4    Basename 01/12/12 1934  LABPT --  INR 1.04    Phyical Exam  Gen:NAD, comfortable Ext:    Right Lower Extremity   No change in exam   Skin stable   Ext warm    Assessment/Plan:  78 y/o male s/p ground level fall with R femoral neck fracture  1. R femoral neck fracture  OR today for R hip hemiarthroplasty  Pt will be permitted to be WBAT after surgery which should help recovery process  He will have hip precautions as well  Titrate pain meds post op as needed, suspect pt will only need tylenol with low dose prn narcotic  2. Continue per IM  3. FEN:NPO  4. ACTIVITY: bed rest for now, will be WBAT with posterior hip precautions post op 5. DVT/PE PROPHYLAXIS: Lovenox post op 6. DISPO: OR today  Mearl Latin, PA-C [facility] [phone] (P) 01/15/2012, 9:57 AM

## 2012-01-15 NOTE — Anesthesia Preprocedure Evaluation (Signed)
Anesthesia Evaluation  Patient identified by MRN, date of birth, ID band Patient awake    Reviewed: Allergy & Precautions, H&P , NPO status , Patient's Chart, lab work & pertinent test results, reviewed documented beta blocker date and time   Airway Mallampati: II TM Distance: >3 FB Neck ROM: Full    Dental  (+) Teeth Intact and Dental Advisory Given   Pulmonary pneumonia -,  + rhonchi   + decreased breath sounds      Cardiovascular hypertension, Pt. on medications + dysrhythmias Rhythm:Regular Rate:Normal     Neuro/Psych CVA, Residual Symptoms    GI/Hepatic Neg liver ROS,   Endo/Other  negative endocrine ROS  Renal/GU      Musculoskeletal   Abdominal Normal abdominal exam  (+)   Peds  Hematology negative hematology ROS (+)   Anesthesia Other Findings   Reproductive/Obstetrics                           Anesthesia Physical Anesthesia Plan  ASA: III  Anesthesia Plan: General   Post-op Pain Management:    Induction: Intravenous  Airway Management Planned: Oral ETT  Additional Equipment:   Intra-op Plan:   Post-operative Plan: Extubation in OR  Informed Consent: I have reviewed the patients History and Physical, chart, labs and discussed the procedure including the risks, benefits and alternatives for the proposed anesthesia with the patient or authorized representative who has indicated his/her understanding and acceptance.   Dental advisory given  Plan Discussed with: Anesthesiologist and Surgeon  Anesthesia Plan Comments:         Anesthesia Quick Evaluation

## 2012-01-15 NOTE — Addendum Note (Signed)
Addendum  created 01/15/12 1744 by Kerby Nora, MD   Modules edited:Orders, PRL Based Order Sets

## 2012-01-16 ENCOUNTER — Encounter (HOSPITAL_COMMUNITY): Payer: Self-pay | Admitting: Orthopedic Surgery

## 2012-01-16 LAB — BASIC METABOLIC PANEL
Calcium: 8.1 mg/dL — ABNORMAL LOW (ref 8.4–10.5)
Creatinine, Ser: 0.75 mg/dL (ref 0.50–1.35)
GFR calc non Af Amer: 84 mL/min — ABNORMAL LOW (ref >90)
Glucose, Bld: 119 mg/dL — ABNORMAL HIGH (ref 70–99)
Sodium: 137 mEq/L (ref 135–145)

## 2012-01-16 LAB — CBC
Hemoglobin: 12 g/dL — ABNORMAL LOW (ref 13.0–17.0)
MCH: 32.9 pg (ref 26.0–34.0)
MCHC: 35.6 g/dL (ref 30.0–36.0)
MCV: 92.3 fL (ref 78.0–100.0)

## 2012-01-16 MED ORDER — ASPIRIN 81 MG PO CHEW
81.0000 mg | CHEWABLE_TABLET | Freq: Every day | ORAL | Status: DC
  Administered 2012-01-16 – 2012-01-18 (×3): 81 mg via ORAL
  Filled 2012-01-16 (×3): qty 1

## 2012-01-16 MED ORDER — AZITHROMYCIN 500 MG PO TABS
500.0000 mg | ORAL_TABLET | Freq: Every day | ORAL | Status: DC
  Administered 2012-01-17 – 2012-01-18 (×2): 500 mg via ORAL
  Filled 2012-01-16 (×2): qty 1

## 2012-01-16 MED ORDER — SODIUM CHLORIDE 0.9 % IV BOLUS (SEPSIS)
500.0000 mL | Freq: Once | INTRAVENOUS | Status: AC
  Administered 2012-01-16: 500 mL via INTRAVENOUS

## 2012-01-16 MED ORDER — CLOPIDOGREL BISULFATE 75 MG PO TABS
75.0000 mg | ORAL_TABLET | Freq: Every day | ORAL | Status: DC
  Administered 2012-01-16 – 2012-01-18 (×3): 75 mg via ORAL
  Filled 2012-01-16 (×4): qty 1

## 2012-01-16 NOTE — Progress Notes (Signed)
01/16/12 1303  PT Visit Information  Last PT Received On 01/16/12  Assistance Needed +2  PT Time Calculation  PT Start Time 1303  PT Stop Time 1320  PT Time Calculation (min) 17 min  Subjective Data  Subjective "I am tired."  Precautions  Precautions Posterior Hip;Fall  Precaution Booklet Issued Yes (comment)  Restrictions  Weight Bearing Restrictions Yes  RLE Weight Bearing WBAT  Cognition  Overall Cognitive Status Appears within functional limits for tasks assessed/performed  Arousal/Alertness Awake/alert  Orientation Level Appears intact for tasks assessed  Behavior During Session [facility] for tasks performed  Bed Mobility  Bed Mobility Sit to Supine  Rolling Left Not tested (comment)  Left Sidelying to Sit Not tested (comment)  Sit to Supine 3: Mod assist  Details for Bed Mobility Assistance Pt. needed assist for placing LEs into the bed.    Transfers  Transfers Sit to Stand;Stand to Sit;Stand Pivot Transfers  Sit to Stand 1: +2 Total assist;With upper extremity assist;From chair/3-in-1;With armrests  Sit to Stand: Patient Percentage 40%  Stand to Sit 1: +2 Total assist;With upper extremity assist;To bed  Stand to Sit: Patient Percentage 40%  Stand Pivot Transfers 1: +2 Total assist;With armrests  Stand Pivot Transfers: Patient Percentage 50%  Squat Pivot Transfers Not tested (comment)  Details for Transfer Assistance Patient needed cues for hand placement.  Used pad to assist with anterior elevation of pelvis.  Patient needed assist to place right UE on plaftform as well as to keep pelvis anterior.  Patient had feet too close together and had difficulty separating feet even with cues and facilitation.  Appeared to have incr extensor  tone in right LE limiting ability to have postural stability.  Facilitated with max assist weight shift so that patient could pivot around from recliner to bed using RW with platform.    Ambulation/Gait  Ambulation/Gait Assistance Not tested  (comment)  Stairs No  Wheelchair Mobility  Wheelchair Mobility No  Balance  Balance Assessed No  Exercises  Exercises Total Joint  Total Joint Exercises  Ankle Circles/Pumps 5 reps;Both;AAROM;Supine  Heel Slides 5 reps;AAROM;Right;Supine  Quad Sets Right;5 reps;AROM;Limitations (extensor tone and hamstring tightness limits full knee exten)  Hip ABduction/ADduction AAROM;Right;5 reps;Supine  PT - End of Session  Equipment Utilized During Treatment Gait belt;Oxygen  Activity Tolerance Patient limited by fatigue;Patient limited by pain  Patient left in bed;with call bell/phone within reach  Nurse Communication Mobility status;Precautions;Weight bearing status  PT - Assessment/Plan  Comments on Treatment Session Patient s/p right hip hemiarthroplasty with decr mobility secondary to decr endurance and decr balance.  Will benefit from PT to incr mobility as patient very limited secondary to pain and poor postural stability.  Needs rehab.    PT Plan Discharge plan remains appropriate;Frequency remains appropriate  PT Frequency Min 6X/week  Recommendations for Other Services Rehab consult  Follow Up Recommendations Inpatient Rehab  Equipment Recommended Rolling walker with 5" wheels  Acute Rehab PT Goals  PT Goal Formulation With patient  PT Goal: Sit to Stand - Progress Progressing toward goal  PT Transfer Goal: Bed to Chair/Chair to Bed - Progress Progressing toward goal  PT Goal: Perform Home Exercise Program - Progress Progressing toward goal  Additional Goals  PT Goal: Additional Goal #1 - Progress Progressing toward goal  PT General Charges  $$ ACUTE PT VISIT 1 Procedure  PT Treatments  $Therapeutic Activity 8-22 mins   [employer] [facility] [employer] [phone]

## 2012-01-16 NOTE — Evaluation (Signed)
Physical Therapy Evaluation Patient Details Name: Richard Kent MRN: [id number] DOB: 12-Nov-1928 Today's Date: 01/16/2012 Time: 4098-1191 PT Time Calculation (min): 40 min  PT Assessment / Plan / Recommendation Clinical Impression  Patient s/p right hemiarthroplasty with decr mobility secondary to pain and poor balance/postural stability.  Patient will benefit from PT to address balance and endurance issues.  Desats with activity.  Will need NHP.        PT Assessment  Patient needs continued PT services    Follow Up Recommendations  Inpatient Rehab;Supervision/Assistance - 24 hour    Barriers to Discharge        Equipment Recommendations  Rolling walker with 5" wheels (platform attachment)    Recommendations for Other Services Rehab consult   Frequency Min 6X/week    Precautions / Restrictions Precautions Precautions: Fall;Posterior Hip Precaution Booklet Issued: Yes (comment) Precaution Comments: posted precautions in room on wall and educated patient re: precautions Restrictions Weight Bearing Restrictions: Yes RLE Weight Bearing: Weight bearing as tolerated   Pertinent Vitals/Pain Desat with activity, 10/10 pain      Mobility  Bed Mobility Bed Mobility: Rolling Left;Left Sidelying to Sit;Sitting - Scoot to Edge of Bed Rolling Left: 4: Min assist Left Sidelying to Sit: 4: Min assist;With rails;HOB flat Sitting - Scoot to Edge of Bed: 4: Min assist Details for Bed Mobility Assistance: Used pad to assist patient.  Patient needed a little assist to elevate trunk.   Transfers Transfers: Sit to Stand;Stand to Sit;Squat Pivot Transfers Sit to Stand: 3: Mod assist;With upper extremity assist;From bed;From elevated surface Stand to Sit: 3: Mod assist;With upper extremity assist;With armrests;To chair/3-in-1 Squat Pivot Transfers: With upper extremity assistance;With armrests;3: Mod assist Details for Transfer Assistance: Patient needed incr cues for hand placement.  Multiple  attempts for sit to stand unsuccessful to RW.  Even obtained a second person and we could not get him up either with +2 assist.  Moved RW and performed squat pivot transfer with pad with patient able to weight bear but needed incr asssit to facilitate postural stability and pivot.  Patient unable to control descent into chair.     Ambulation/Gait Ambulation/Gait Assistance: Not tested (comment) Stairs: No Wheelchair Mobility Wheelchair Mobility: No    Exercises Total Joint Exercises Ankle Circles/Pumps: Both;5 reps;Supine;AAROM Heel Slides: AAROM;Both;5 reps;Supine   PT Diagnosis: Generalized weakness;Acute pain;Abnormality of gait  PT Problem List: Decreased activity tolerance;Decreased balance;Decreased mobility;Decreased safety awareness;Decreased knowledge of use of DME;Decreased knowledge of precautions;Pain;Decreased strength;Decreased range of motion PT Treatment Interventions: DME instruction;Gait training;Functional mobility training;Therapeutic activities;Therapeutic exercise;Balance training;Patient/family education;Neuromuscular re-education   PT Goals Acute Rehab PT Goals PT Goal Formulation: With patient Time For Goal Achievement: 01/30/12 Potential to Achieve Goals: Good Pt will go Supine/Side to Sit: with supervision;with rail PT Goal: Supine/Side to Sit - Progress: Goal set today Pt will Sit at Edge of Bed: with modified independence;3-5 min;with bilateral upper extremity support PT Goal: Sit at Edge Of Bed - Progress: Goal set today Pt will go Sit to Stand: with min assist;with upper extremity assist PT Goal: Sit to Stand - Progress: Goal set today Pt will Transfer Bed to Chair/Chair to Bed: with min assist PT Transfer Goal: Bed to Chair/Chair to Bed - Progress: Goal set today Pt will Ambulate: 16 - 50 feet;with +2 total assist;with least restrictive assistive device (pt = 60%) PT Goal: Ambulate - Progress: Goal set today Pt will Perform Home Exercise Program: with  supervision, verbal cues required/provided PT Goal: Perform Home Exercise Program - Progress: Goal  set today Additional Goals Additional Goal #1: Patient will verbalize and follow 3/3 hip precautions. PT Goal: Additional Goal #1 - Progress: Goal set today  Visit Information  Last PT Received On: 01/16/12 Assistance Needed: +2    Subjective Data  Subjective: "I feel so tired." Patient Stated Goal: To go home   Prior Functioning  Home Living Lives With: Spouse Available Help at Discharge: Family;Available 24 hours/day Type of Home: House Home Access: Stairs to enter [employer] of Steps: 2 +3 Entrance Stairs-Rails: None Home Layout: Two level;Bed/bath upstairs Alternate Level Stairs-Number of Steps: 14  Alternate Level Stairs-Rails: Right Bathroom Toilet: Handicapped height Home Adaptive Equipment: Tub transfer bench;Quad cane;Wheelchair - manual Psychologist, educational) Additional Comments: Per patient, wife and son, patient ambulated with hemiwalker indoors and quad cane outdoors.  Also used wheelchair at times.  Patient was slow with ambulation and they used gait belt with patient but he was able to ambulate PTA. Prior Function Level of Independence: Needs assistance Needs Assistance: Gait;Bathing;Dressing;Feeding;Grooming;Toileting Bath: Minimal Dressing: Minimal Feeding: Supervision/set-up Grooming: Minimal Toileting: Minimal Gait Assistance: See above Able to Take Stairs?: Yes Driving: No Vocation: Retired Musician: No difficulties    Cognition  Overall Cognitive Status: Appears within functional limits for tasks assessed/performed Arousal/Alertness: Awake/alert Orientation Level: Appears intact for tasks assessed Behavior During Session: [facility] for tasks performed    Extremity/Trunk Assessment Right Upper Extremity Assessment RUE ROM/Strength/Tone: [facility] for tasks assessed Left Upper Extremity Assessment LUE ROM/Strength/Tone: WFL for tasks  assessed Right Lower Extremity Assessment RLE ROM/Strength/Tone: Deficits;Due to pain;Unable to fully assess Left Lower Extremity Assessment LLE ROM/Strength/Tone: Deficits LLE ROM/Strength/Tone Deficits: grossly 3-/5 Trunk Assessment Trunk Assessment: Kyphotic   Balance Static Sitting Balance Static Sitting - Balance Support: Bilateral upper extremity supported;Feet supported Static Sitting - Level of Assistance: 5: Stand by assistance Static Sitting - Comment/# of Minutes: 2 mintues  End of Session PT - End of Session Equipment Utilized During Treatment: Gait belt;Oxygen Activity Tolerance: Patient limited by fatigue;Patient limited by pain Patient left: in chair;with call bell/phone within reach;with family/visitor present Nurse Communication: Mobility status;Need for lift equipment;Patient requests pain meds;Precautions;Weight bearing status      INGOLD,Travonta Gill 01/16/2012, 2:46 PM  [facility] [phone] (pager)

## 2012-01-16 NOTE — Op Note (Signed)
NAMESLADE, PIERPOINT NO.:  [id number]  MEDICAL RECORD NO.:  [id number]  LOCATION:  4742                         FACILITY:  [facility]  PHYSICIAN:  Doralee Albino. Carola Frost, M.D. DATE OF BIRTH:  [birth year]  DATE OF PROCEDURE:  01/15/2012 DATE OF DISCHARGE:                              OPERATIVE REPORT   PREOPERATIVE DIAGNOSIS:  Displaced right femoral neck fracture.  POSTOPERATIVE DIAGNOSIS:  Displaced right femoral neck fracture.  PROCEDURE:  Right hip hemiarthroplasty using a DePuy Summit Basic #6 femoral stem, -3 neck and 54-mm unipolar head.  SURGEON:  Doralee Albino. Carola Frost, MD  ASSISTANT:  Mearl Latin, PA  ANESTHESIA:  General.  COMPLICATIONS:  None.  ESTIMATED BLOOD LOSS:  100 mL.  SPECIMENS:  None.  DISPOSITION:  To PACU.  CONDITION:  Stable.  BRIEF SUMMARY OF INDICATION FOR PROCEDURE:  Richard Kent is a very pleasant 78 year old male status post right-sided hemiparesis from CVA and carotid stenosis.  The patient underwent multiple service workup preoperatively including Dr. Mayford Knife from Cardiology, and Dr. Arbie Cookey from Vascular in addition to his primary medical service eval.  We did discuss with the patient and his family the risks and benefits of surgery including the possibility of infection, nerve injury, vessel injury, leg length inequality, instability, need for further surgery, and multiple others.  The patient understood these risks in depth and did wish to proceed.  BRIEF SUMMARY OF PROCEDURE:  The patient was given an additional gram of Ancef preoperatively, taken to the operating room where general anesthesia was induced.  He was positioned right side up with the hip positioners.  His right lower extremity was prepped and draped in usual sterile fashion.  A standard posterior approach was then made making the incision to the tensor in line with the skin incision which was centered over the greater trochanter approximately 8 cm in length.  The  tensor was divided and the piriformis separated near its insertion.  The short rotators were confluent with the capsule and scarred in.  These were removed and then the capsule teed tagging the corners with #2 FiberWires.  The piriformis had been tagged at its division.  The neck cut was then made using a template.  This was followed by preparation of the femur reaming up to 6 and broaching up to 6.  This gave an outstanding fit and fill with good rotational control.  The acetabulum coincided perfectly with the 54-mm cup as far as the head with a 55 being slightly too large.  The trial components were then size with a -3 fitting best. The acetabulum was cleaned once more, and then the actual component placed engaging excellent purchase along the calcar with the neck of the stem and then relocating easily preparing the capsule with #2 FiberWire and then #1 figure-of-eight Vicryl, followed by #2 FiberWire repaired back through bone tunnels of the piriformis.  This gave excellent apposition and a stout surface for repair.  The hip was completely stable at greater than 90 degrees of flexion, abduction and 40 degrees of internal rotation as well as extension, external rotation and adduction.  Montez Morita, PA-C assisted me throughout the procedure and was absolutely necessary that  he needed to control the femur and delivered for preparation of the canal and careful retraction such that we could size and work within the acetabulum as well.  Total blood loss was only 100 mL, and the patient did not have any hemodynamic complications during the procedure.  He was then taken to the PACU in stable condition  after placement of a sterile gently compressive dressing, and then abduction pillow.  PROGNOSIS:  The patient will be weightbearing as tolerated with posterior hip precautions and restarted on his Plavix beginning in a few days after a bridge with Lovenox.  We anticipate discharge on  Plavix alone and currently the patient is being evaluated for a rehab stay at [name] place where he went after his previous stroke.  He remains at increased risk for complications given his multiple medical problems, not least of which was his admission pneumonia and pre-existing carotid disease.     Doralee Albino. Carola Frost, M.D.     [facility]/MEDQ  D:  01/15/2012  T:  01/16/2012  Job:  [id number]

## 2012-01-16 NOTE — Progress Notes (Signed)
Utilization review completed.  

## 2012-01-16 NOTE — Progress Notes (Signed)
[facility] PROGRESS NOTE  Richard Kent ZOX:[id number] DOB: [birth year] DOA: 01/12/2012 PCP: Carollee Herter, MD  Assessment/Plan: Closed Right hip fracture  -Status post right hemiarthroplasty 01/15/2012 -Start PT OT Pneumonia-CAP  -Continue cetriaxone/azithromycin day #3  Hypoxemia  -Stable/improving, continue supplemental oxygen  -Likely combination of pneumonia and underlying COPD  -50pk yr hx of tobacco  Acute confusion -likely due to norco and hydromorphone -minimize if possible Hypertension  -patient  hypotensive around noontime, 80/46 -Likely a combination of his antihypertensives, opioid medications, and possible volume depletion -Discontinued his metoprolol, lisinopril, and amlodipine for now -500 cc bolus normal saline and recheck blood pressure. Right bundle-branch block  -Discussed with Dr. Mayford Knife, no further workup at this time  History of stroke with right hemiparesis  -No active TIA symptoms at this time  -Restart aspirin/ Plavix  Carotid stenosis  -Appreciate Dr. Bosie Helper recommendations, no further intervention necessary at this time  DVT prophylaxis  -Orthopedics okay with aspirin/Plavix -Concerned expressed overt bleeding risk of using rivaroxaban in addition to aspirin/Plavix   Disposition:  DC to [facility] when cleared by orthopedics for PT/OT, hopeful for Wednesday/Thursday --updated wife today   Procedures/Studies: Dg Chest 1 View  01/12/2012  *RADIOLOGY REPORT*  Clinical Data: Fall, right hip pain  CHEST - 1 VIEW  Comparison: 10/19/2009  Findings: Increased interstitial markings, possibly chronic. Bibasilar atelectasis.  No pleural effusion or pneumothorax.  The heart is normal in size.  IMPRESSION: No evidence of acute cardiopulmonary disease.   Original Report Authenticated By: Charline Bills, M.D.    Dg Hip Complete Right  01/12/2012  *RADIOLOGY REPORT*  Clinical Data: Fall, right hip pain  RIGHT HIP - COMPLETE 2+ VIEW   Comparison: None.  Findings: Subcapital right hip fracture.  Secondary varus angulation.  Left hip is unremarkable.  Degenerative changes of the lower lumbar spine.  IMPRESSION: Subcapital right hip fracture.   Original Report Authenticated By: Charline Bills, M.D.    Ct Angio Chest Pe W/cm &/or Wo Cm  01/13/2012  *RADIOLOGY REPORT*  Clinical Data: 78 year old male.  Right side weakness.  Right hip fracture.  Hypoxia.  CT ANGIOGRAPHY CHEST  Technique:  Multidetector CT imaging of the chest using the standard protocol during bolus administration of intravenous contrast. Multiplanar reconstructed images including MIPs were obtained and reviewed to evaluate the vascular anatomy.  Contrast:  100 ml Omnipaque three-phase D.  Comparison: Chest radiographs 01/12/2012 and earlier.  Findings: Adequate contrast bolus timing in the pulmonary arterial tree.  No focal filling defect identified in the pulmonary arterial tree to suggest the presence of acute pulmonary embolism.  Atelectatic changes to the major airways.  Bilateral lower lobe medial basal segment consolidation with air bronchograms, greater on the left.  Trace bilateral pleural effusions.  Superimposed dependent atelectasis in the upper lobes.  Cardiomegaly.  No pericardial effusion.  No mediastinal lymphadenopathy.  Negative thoracic inlet.  Negative visualized aorta and great vessels except for atherosclerosis.  There is coronary artery atherosclerosis.  Negative visualized upper abdominal viscera.  No acute osseous abnormality identified.  IMPRESSION: 1. No evidence of acute pulmonary embolus. 2.  Bilateral lower lobe pneumonia with trace pleural fluid.   Original Report Authenticated By: Harley Hallmark, M.D.    Dg Pelvis Portable  01/15/2012  *RADIOLOGY REPORT*  Clinical Data: Status post hip replacement.  PORTABLE PELVIS  Comparison: None.  Findings: Right hip prosthesis in satisfactory position and alignment.  No fracture or dislocation seen.  Staple  overlying the left superior acetabulum.  Atheromatous arterial calcifications.  IMPRESSION: Satisfactory postoperative appearance of a right hip prosthesis.   Original Report Authenticated By: Darrol Angel, M.D.    Dg Hip Portable 1 View Right  01/15/2012  *RADIOLOGY REPORT*  Clinical Data: Status post right hip replacement.  PORTABLE RIGHT HIP - 1 VIEW  Comparison: Portable pelvis obtained at the same time.  Findings: Again demonstrated is a right hip prosthesis in satisfactory position and alignment.  No fracture or dislocation seen.  IMPRESSION: Satisfactory postoperative appearance of a right hip prosthesis.   Original Report Authenticated By: Darrol Angel, M.D.    Dg Knee Right Port  01/13/2012  *RADIOLOGY REPORT*  Clinical Data: 78 year old male with hip fracture.  Pain.  PORTABLE RIGHT KNEE - 1-2 VIEW  Comparison: Right hip series 01/12/2012.  Findings: Portable AP and cross-table lateral views of the right knee.  No joint effusion.  Mild for age joint space loss.  Patella appears intact.  No acute fracture identified about the right knee. Calcified atherosclerosis in the right lower extremity.  IMPRESSION: No acute fracture or dislocation identified about the right knee.   Original Report Authenticated By: Harley Hallmark, M.D.     Procedures:  Right hip hemiarthroplasty August 26  Antibiotics:  Ceftriaxone August 25  Azithromycin August 25   Code Status: Full   Subjective: Patient complains of some pain in the right hip today. He denies any chest pain, shortness of breath, nausea, vomiting, diarrhea, abdominal pain. He denies any fevers, chills.  Objective: Filed Vitals:   01/16/12 0430 01/16/12 0800 01/16/12 1021 01/16/12 1022  BP: 123/62  126/46   Pulse: 67   64  Temp: 99 F (37.2 C)     TempSrc: Oral     Resp: 17 19    Height:      Weight: 87.726 kg (193 lb 6.4 oz)     SpO2: 90% 94%      Intake/Output Summary (Last 24 hours) at 01/16/12 1220 Last data filed at  01/15/12 1800  Gross per 24 hour  Intake    700 ml  Output    680 ml  Net     20 ml   Weight change: 2.041 kg (4 lb 8 oz) Exam:   General:  Pt is alert, follows commands appropriately, not in acute distress  HEENT: No icterus, No thrush, No neck mass, [place]/AT  Cardiovascular: Regular rate and rhythm, S1/S2, , no rubs, no gallops  Respiratory: Clear to auscultation bilaterally, no wheezing, no crackles, no rhonchi  Abdomen: Soft, non tender, non distended, bowel sounds present, no guarding  Extremities: No edema, pulses DP and PT palpable bilaterally  Data Reviewed: Basic Metabolic Panel:  Lab 01/16/12 1610 01/15/12 0550 01/14/12 0638 01/13/12 0730 01/12/12 1934  NA 137 136 136 140 141  K 3.9 4.0 3.9 4.0 3.7  CL 103 102 103 104 104  CO2 26 24 23 24 26   GLUCOSE 119* 91 103* 110* 120*  BUN 15 17 18 13 14   CREATININE 0.75 0.75 0.85 0.75 0.92  CALCIUM 8.1* 8.9 8.4 8.6 9.3  MG -- -- -- -- --  PHOS -- -- -- -- --   Liver Function Tests: No results found for this basename: AST:5,ALT:5,ALKPHOS:5,BILITOT:5,PROT:5,ALBUMIN:5 in the last 168 hours No results found for this basename: LIPASE:5,AMYLASE:5 in the last 168 hours No results found for this basename: AMMONIA:5 in the last 168 hours CBC:  Lab 01/16/12 0613 01/15/12 0550 01/14/12 0638 01/13/12 0730 01/12/12 1934  WBC 8.3 10.9* 12.6* 14.9* 18.8*  NEUTROABS -- -- 10.2* -- 16.2*  HGB 12.0* 15.0 14.0 15.3 15.6  HCT 33.7* 41.9 40.0 43.6 43.1  MCV 92.3 93.9 94.6 94.4 93.5  PLT 174 168 161 182 206   Cardiac Enzymes: No results found for this basename: CKTOTAL:5,CKMB:5,CKMBINDEX:5,TROPONINI:5 in the last 168 hours BNP: No components found with this basename: POCBNP:5 CBG: No results found for this basename: GLUCAP:5 in the last 168 hours  Recent Results (from the past 240 hour(s))  URINE CULTURE     Status: Normal   Collection Time   01/13/12  4:38 PM      Component Value Range Status Comment   Specimen Description URINE,  CLEAN CATCH   Final    Special Requests NONE   Final    Culture  Setup Time 01/14/2012 03:11   Final    Colony Count NO GROWTH   Final    Culture NO GROWTH   Final    Report Status 01/15/2012 FINAL   Final   CULTURE, BLOOD (ROUTINE X 2)     Status: Normal (Preliminary result)   Collection Time   01/14/12 10:50 AM      Component Value Range Status Comment   Specimen Description BLOOD RIGHT HAND   Final    Special Requests BOTTLES DRAWN AEROBIC ONLY 4CC   Final    Culture  Setup Time 01/14/2012 16:56   Final    Culture     Final    Value:        BLOOD CULTURE RECEIVED NO GROWTH TO DATE CULTURE WILL BE HELD FOR 5 DAYS BEFORE ISSUING A FINAL NEGATIVE REPORT   Report Status PENDING   Incomplete   CULTURE, BLOOD (ROUTINE X 2)     Status: Normal (Preliminary result)   Collection Time   01/14/12 11:05 AM      Component Value Range Status Comment   Specimen Description BLOOD LEFT HAND   Final    Special Requests BOTTLES DRAWN AEROBIC ONLY 6CC   Final    Culture  Setup Time 01/14/2012 16:56   Final    Culture     Final    Value:        BLOOD CULTURE RECEIVED NO GROWTH TO DATE CULTURE WILL BE HELD FOR 5 DAYS BEFORE ISSUING A FINAL NEGATIVE REPORT   Report Status PENDING   Incomplete   SURGICAL PCR SCREEN     Status: Normal   Collection Time   01/15/12 12:12 PM      Component Value Range Status Comment   MRSA, PCR NEGATIVE  NEGATIVE Final    Staphylococcus aureus NEGATIVE  NEGATIVE Final      Scheduled Meds:   . amLODipine  10 mg Oral q morning - 10a  . antiseptic oral rinse  15 mL Mouth Rinse BID  . azithromycin  500 mg Oral Daily  . baclofen  10 mg Oral TID  .  ceFAZolin (ANCEF) IV  2 g Intravenous Q8H  . cefTRIAXone (ROCEPHIN)  IV  1 g Intravenous Q24H  . enoxaparin (LOVENOX) injection  40 mg Subcutaneous Q24H  . HYDROmorphone      . HYDROmorphone      . lisinopril  10 mg Oral q morning - 10a  . metoprolol tartrate  12.5 mg Oral BID  . mupirocin ointment   Nasal Once  .  senna-docusate  2 tablet Oral QHS  . simvastatin  20 mg Oral QPM  . tiZANidine  2 mg Oral BID  . DISCONTD: azithromycin  500 mg Intravenous Q24H   Continuous Infusions:   . DISCONTD: lactated ringers 50 mL/hr at 01/16/12 0519     Deaisha Welborn, DO  [facility] Pager [phone]  If 7PM-7AM, please contact night-coverage [email or web address] Password [email or web address] 01/16/2012, 12:20 PM   LOS: 4 days

## 2012-01-16 NOTE — Progress Notes (Signed)
Upon arrival to patient's room, patient found to be confused and disoriented.  Vital signs obtained and patient re-oriented to baseline.  MD has been made aware. New orders given.  Will continue to monitor. Louretta Parma, RN

## 2012-01-16 NOTE — Progress Notes (Addendum)
Order for case management consult re HH needs, however pt is s/p fx femur repair, will await PT/OT eval for recommendations , as pt may require inpt rehab at some level prior to d/c to home. CM following for progression. Also noted request to assist with medications, pt has insurance.  Johny Shock RN MPH Case manager [phone]

## 2012-01-16 NOTE — Progress Notes (Signed)
[facility] (OTS)  Subjective: 1 Day Post-Op Procedure(s) (LRB): ARTHROPLASTY BIPOLAR HIP (Right)    Doing well today Getting bath  Pain in R hip tolerable  Objective: Current Vitals Blood pressure 123/62, pulse 67, temperature 99 F (37.2 C), temperature source Oral, resp. rate 17, height 6' (1.829 m), weight 87.726 kg (193 lb 6.4 oz), SpO2 90.00%. Vital signs in last 24 hours: Temp:  [97.8 F (36.6 C)-99 F (37.2 C)] 99 F (37.2 C) (08/27 0430) Pulse Rate:  [56-79] 67  (08/27 0430) Resp:  [12-28] 17  (08/27 0430) BP: (100-138)/(45-77) 123/62 mmHg (08/27 0430) SpO2:  [90 %-94 %] 90 % (08/27 0430) Weight:  [87.726 kg (193 lb 6.4 oz)] 87.726 kg (193 lb 6.4 oz) (08/27 0430)  Intake/Output from previous day: 08/26 0701 - 08/27 0700 In: 700 [I.V.:700] Out: 905 [Urine:805; Blood:100]  LABS  Basename 01/16/12 0613 01/15/12 0550 01/14/12 0638  HGB 12.0* 15.0 14.0    Basename 01/16/12 0613 01/15/12 0550  WBC 8.3 10.9*  RBC 3.65* 4.46  HCT 33.7* 41.9  PLT 174 168    Basename 01/16/12 0613 01/15/12 0550  NA 137 136  K 3.9 4.0  CL 103 102  CO2 26 24  BUN 15 17  CREATININE 0.75 0.75  GLUCOSE 119* 91  CALCIUM 8.1* 8.9   No results found for this basename: LABPT:2,INR:2 in the last 72 hours   Physical Exam  Gen: In bed getting bath Lungs:breathing unlabored, not short of breath when answering questions Cardiac:pulses reg Ext:       Right Lower Extremity  Dressing c/d/i  Abduction pillow in place  Distal motor and sensory functions at baseline  Ext warm  Swelling mild    Imaging Dg Pelvis Portable  01/15/2012  *RADIOLOGY REPORT*  Clinical Data: Status post hip replacement.  PORTABLE PELVIS  Comparison: None.  Findings: Right hip prosthesis in satisfactory position and alignment.  No fracture or dislocation seen.  Staple overlying the left superior acetabulum.  Atheromatous arterial calcifications.  IMPRESSION: Satisfactory postoperative  appearance of a right hip prosthesis.   Original Report Authenticated By: Darrol Angel, M.D.    Dg Hip Portable 1 View Right  01/15/2012  *RADIOLOGY REPORT*  Clinical Data: Status post right hip replacement.  PORTABLE RIGHT HIP - 1 VIEW  Comparison: Portable pelvis obtained at the same time.  Findings: Again demonstrated is a right hip prosthesis in satisfactory position and alignment.  No fracture or dislocation seen.  IMPRESSION: Satisfactory postoperative appearance of a right hip prosthesis.   Original Report Authenticated By: Darrol Angel, M.D.     Assessment/Plan: 1 Day Post-Op Procedure(s) (LRB): ARTHROPLASTY BIPOLAR HIP (Right)  78 y/o male s/p fall   1. Displaced R femoral neck fracture POD #1, R hip hemiarthroplasty  WBAT  Posterior hip precautions  Pillow or abduction pillow between legs when in bed and when in chairs (wheelchair or bedside chair) to prevent hips adducting and internally rotating  Ice prn  Dressing change prn  Therapies today 2. Pain  Low dose narcotics  Will see what po meds pt tolerates today and will put rx's in chart tomorrow  Will likely send home with Norco and reg tylenol 3. DVT/PE prophylaxis  Cover with lovenox while inpatient  Upon d/c ok with resuming plavix alone, concern with over-anticoagulation with lovenox AND plavix 4. Continue per IM 5. Dispo  Therapies today  Pt seems to be doing well from pain standpoint and clinically, if does well with PT it  is possible he could d/c home tomorrow if he remains stable   Mearl Latin, PA-C [facility] [phone] (P) 01/16/2012, 9:07 AM

## 2012-01-17 DIAGNOSIS — J189 Pneumonia, unspecified organism: Secondary | ICD-10-CM

## 2012-01-17 DIAGNOSIS — S72009A Fracture of unspecified part of neck of unspecified femur, initial encounter for closed fracture: Secondary | ICD-10-CM

## 2012-01-17 DIAGNOSIS — I658 Occlusion and stenosis of other precerebral arteries: Secondary | ICD-10-CM

## 2012-01-17 LAB — CBC
MCH: 33 pg (ref 26.0–34.0)
MCHC: 35.1 g/dL (ref 30.0–36.0)
MCV: 93.8 fL (ref 78.0–100.0)
Platelets: 196 10*3/uL (ref 150–400)
RBC: 3.73 MIL/uL — ABNORMAL LOW (ref 4.22–5.81)
RDW: 14 % (ref 11.5–15.5)

## 2012-01-17 LAB — BASIC METABOLIC PANEL
Calcium: 8.5 mg/dL (ref 8.4–10.5)
Creatinine, Ser: 0.78 mg/dL (ref 0.50–1.35)
GFR calc Af Amer: >90 mL/min (ref >90)
GFR calc non Af Amer: 83 mL/min — ABNORMAL LOW (ref >90)
Sodium: 138 mEq/L (ref 135–145)

## 2012-01-17 LAB — VITAMIN D 1,25 DIHYDROXY
Vitamin D2 1, 25 (OH)2: <8 pg/mL
Vitamin D3 1, 25 (OH)2: 28 pg/mL

## 2012-01-17 MED ORDER — HYDROCODONE-ACETAMINOPHEN 5-325 MG PO TABS: 1.0000 | ORAL_TABLET | Freq: Four times a day (QID) | ORAL | Status: AC | PRN

## 2012-01-17 MED ORDER — ALBUTEROL SULFATE (5 MG/ML) 0.5% IN NEBU: 2.5000 mg | INHALATION_SOLUTION | RESPIRATORY_TRACT | Status: DC | PRN

## 2012-01-17 MED ORDER — ACETAMINOPHEN 325 MG PO TABS: 325.0000 mg | ORAL_TABLET | Freq: Four times a day (QID) | ORAL | Status: DC | PRN

## 2012-01-17 NOTE — PMR Pre-admission (Signed)
PMR Admission Coordinator Pre-Admission Assessment  Patient: Richard Kent is an 78 y.o., male MRN: [id number] DOB: 04/12/1929 Height: 6' (182.9 cm) Weight: 88 kg (194 lb 0.1 oz) (bed scale pt bed rest right hip fracture and knee immobilize)  Insurance Information HMO:     PPO:      PCP:      IPA:      80/20: yes     OTHER: no hmo PRIMARY: Medicare a and b      Policy#: [id number]      Subscriber: pt CM Name:       Phone#:      Fax#:  Pre-Cert#:       Employer:  Benefits:  Phone #: vision share     Name: 01/17/12 Eff. Date: 01/21/96     Deduct: $1184      Out of Pocket Max: none      Life Max: none CIR: 100%      SNF: 20 full days LBD 02/27/2010 spend 74 days in [facility] Outpatient: 80%     Co-Pay: 20% Home Health: 100%      Co-Pay: none DME: 80%     Co-Pay: 20% Providers: pt choice  SECONDARY: [employer] Managed      Policy#: [id number]      Subscriber: pt No approval needed   Emergency Contact Information Contact Information    Name Relation Home Work [place] Other [phone]    Nameer, Summer Father [phone]       Current Medical History  Patient Admitting Diagnosis: right FNF s/p hip hemi. Hx of prior left CVA with residual right spastic hemiparesis  History of Present Illness:Eichinger is a 78 y.o. male with history of CVA with residual R-HP and aphasia (CIR to SNF 7/11), B-CAS, admitted on 01/13/12 due to Ground level fall with subsequent right hip pain. Xrays done revealing displaced right subcapital femoral neck fracture and surgical intervention recommended by Dr. Carola Frost. Patient noted to be hypoxic at admission and cardiology consulted for input. CTA chest done revealing BLL PNA and 2D echo done revealing EF 60-65% with diastolic dysfunction. Started on IV antibiotics for treatment. Cleared for surgery by Dr. Arbie Cookey as family with concerns regarding Peri/post-operative CVA. On 01/15/12, patient underwent Right hemiarthroplasty by Dr. Carola Frost. Post  op WBAT with posterior hip precautions. He has had problems with confusion past surgery ?narcotic related. PT evaluation done yesterday and patient noted to have pain as well as increased extensor tone RLE limiting mobility.   Past Medical History  Past Medical History  Diagnosis Date  . Hypertension   . CVA (cerebral infarction) 2011  . Diverticulosis   . Smoker   . Carotid stenosis   . Kidney stone   . Stroke 11/17/09    Family History  family history includes Cancer in his mother; Heart disease in his father; and Stroke in his mother.  Prior Rehab/Hospitalizations:CIR 2011 and then continued rehab at [facility] for 74 days before returning home. Wife prefers CIR to see if pt can come directly home.  Current Medications  Current facility-administered medications:acetaminophen (TYLENOL) suppository 650 mg, 650 mg, Rectal, Q6H PRN, Mearl Latin, PA;  acetaminophen (TYLENOL) tablet 650 mg, 650 mg, Oral, Q6H PRN, Mearl Latin, PA;  albuterol (PROVENTIL) (5 MG/ML) 0.5% nebulizer solution 2.5 mg, 2.5 mg, Nebulization, Q4H PRN, Therisa Doyne, MD;  antiseptic oral rinse (BIOTENE) solution 15 mL, 15 mL, Mouth Rinse, BID, Therisa Doyne, MD, 15 mL at 01/17/12 0800 aspirin chewable  tablet 81 mg, 81 mg, Oral, Daily, Catarina Hartshorn, MD, 81 mg at 01/17/12 1025;  azithromycin (ZITHROMAX) tablet 500 mg, 500 mg, Oral, Daily, Catarina Hartshorn, MD, 500 mg at 01/17/12 1019;  baclofen (LIORESAL) tablet 10 mg, 10 mg, Oral, TID, Therisa Doyne, MD, 10 mg at 01/17/12 1621;  cefTRIAXone (ROCEPHIN) 1 g in dextrose 5 % 50 mL IVPB, 1 g, Intravenous, Q24H, Catarina Hartshorn, MD, 1 g at 01/17/12 1025 clopidogrel (PLAVIX) tablet 75 mg, 75 mg, Oral, Q breakfast, Catarina Hartshorn, MD, 75 mg at 01/17/12 1019;  enoxaparin (LOVENOX) injection 40 mg, 40 mg, Subcutaneous, Q24H, Mearl Latin, PA, 40 mg at 01/17/12 1017;  HYDROcodone-acetaminophen (NORCO/VICODIN) 5-325 MG per tablet 1-2 tablet, 1-2 tablet, Oral, Q6H PRN, Therisa Doyne, MD, 2 tablet at 01/17/12 1127;  menthol-cetylpyridinium (CEPACOL) lozenge 3 mg, 1 lozenge, Oral, PRN, Mearl Latin, PA methocarbamol (ROBAXIN) 500 mg in dextrose 5 % 50 mL IVPB, 500 mg, Intravenous, Q6H PRN, Therisa Doyne, MD;  methocarbamol (ROBAXIN) tablet 500 mg, 500 mg, Oral, Q6H PRN, Therisa Doyne, MD;  metoCLOPramide (REGLAN) injection 5-10 mg, 5-10 mg, Intravenous, Q8H PRN, Mearl Latin, PA;  metoCLOPramide (REGLAN) tablet 5-10 mg, 5-10 mg, Oral, Q8H PRN, Mearl Latin, PA morphine 2 MG/ML injection 0.5 mg, 0.5 mg, Intravenous, Q2H PRN, Therisa Doyne, MD, 0.5 mg at 01/17/12 1456;  ondansetron (ZOFRAN) injection 4 mg, 4 mg, Intravenous, Q6H PRN, Mearl Latin, PA;  ondansetron [facility]) tablet 4 mg, 4 mg, Oral, Q6H PRN, Mearl Latin, PA;  phenol (CHLORASEPTIC) mouth spray 1 spray, 1 spray, Mouth/Throat, PRN, Mearl Latin, PA senna-docusate (Senokot-S) tablet 2 tablet, 2 tablet, Oral, QHS, Therisa Doyne, MD, 2 tablet at 01/16/12 2149;  simvastatin (ZOCOR) tablet 20 mg, 20 mg, Oral, QPM, Therisa Doyne, MD, 20 mg at 01/17/12 1622;  tiZANidine (ZANAFLEX) tablet 2 mg, 2 mg, Oral, BID, Therisa Doyne, MD, 2 mg at 01/17/12 1017  Patients Current Diet: Cardiac  Precautions / Restrictions Precautions Precautions: Fall;Posterior Hip (Re-educated pt on all 3 hip precautions.  ) Precaution Booklet Issued: Yes (comment) Precaution Comments: Pt is an old CVA affecting his right side (where now he has the hip replacement). Pt has a muscle stimulator that he is using in a study with Dr. Creig Hines Dr. Pearlean Brownie and he said it was fine for the pt to wear this with then new hip replacement since the impulse stays  locally (does not radiate). Restrictions Weight Bearing Restrictions: Yes RLE Weight Bearing: Weight bearing as tolerated   Prior Activity Level Household: limited Journalist, newspaper / Equipment Home Assistive Devices/Equipment: Cane (specify quad or  straight);Walker (specify type) Home Adaptive Equipment: Tub transfer bench;Quad cane;Wheelchair - manual Systems developer)  Prior Functional Level Prior Function Level of Independence: Needs assistance Needs Assistance: Gait;Bathing;Dressing;Feeding;Grooming;Toileting Bath: Minimal Dressing: Minimal Feeding: Supervision/set-up Grooming: Minimal Toileting: Minimal Gait Assistance: See above Able to Take Stairs?: Yes Driving: No Vocation: Retired  Current Functional Level Cognition  Arousal/Alertness: Awake/alert Overall Cognitive Status: Appears within functional limits for tasks assessed/performed Orientation Level: Oriented X4    Extremity Assessment (includes Sensation/Coordination)  RUE ROM/Strength/Tone: Deficits RUE ROM/Strength/Tone Deficits: Old CVA, Brunstrum II  RLE ROM/Strength/Tone: Deficits;Due to pain;Unable to fully assess    ADLs  Eating/Feeding: Simulated;Set up Where Assessed - Eating/Feeding: Chair;Bed level Grooming: Simulated;Set up Where Assessed - Grooming: Supported sitting Upper Body Bathing: Simulated;Minimal assistance Where Assessed - Upper Body Bathing: Supported sitting Lower Body Bathing: Simulated;+1 Total assistance (with +2 sit to stand (pt=30%) from raised surface) Where  Assessed - Lower Body Bathing: Supported sit to stand Upper Body Dressing: Simulated;Moderate assistance Where Assessed - Upper Body Dressing: Supported sitting Lower Body Dressing: Simulated;+1 Total assistance (with +2 sit to stand (pt=30%) from raised surface) Where Assessed - Lower Body Dressing: Supported sit to stand Toilet Transfer: Chief of Staff: Patient Percentage: 30% Statistician Method: Sit to stand;Stand pivot Acupuncturist:  (Bed (raised) to recliner about 1 foot apart) Toileting - Clothing Manipulation and Hygiene: Simulated;+1 Total assistance Where Assessed - Glass blower/designer Manipulation and Hygiene:  Standing Equipment Used: Rolling walker;Gait belt Transfers/Ambulation Related to ADLs: Total A +2 (pt=30%) sit to stand from raised bed and (10%) stand to sit in recliner ADL Comments: Pt was only min A at the worst with BADLs prior to this hip surgery, now with hip precautions pt is greatly limited with BADLs    Mobility  Bed Mobility: Sit to Supine Rolling Left: 4: Min assist Left Sidelying to Sit: 4: Min assist;With rails;HOB flat Supine to Sit: 2: Max assist;With rails;HOB elevated (HOB elevated ~35 degrees) Sitting - Scoot to Edge of Bed: 4: Min assist Sit to Supine: 1: +2 Total assist;HOB flat Sit to Supine: Patient Percentage: 30%    Transfers  Transfers: Sit to Stand;Stand to Sit;Stand Pivot Transfers Sit to Stand: 1: +2 Total assist;With upper extremity assist;From chair/3-in-1;With armrests Sit to Stand: Patient Percentage: 20% Stand to Sit: 1: +2 Total assist;With upper extremity assist;To bed Stand to Sit: Patient Percentage: 10% Stand Pivot Transfers: 1: +2 Total assist Stand Pivot Transfers: Patient Percentage: 30% Squat Pivot Transfers: With upper extremity assistance;With armrests;3: Mod assist    Ambulation / Gait / Stairs / Psychologist, prison and probation services  Ambulation/Gait Ambulation/Gait Assistance: Not tested (comment) Stairs: No Corporate treasurer: No    Posture / Games developer Sitting - Balance Support: Bilateral upper extremity supported;Feet supported Static Sitting - Level of Assistance: 5: Stand by assistance Static Sitting - Comment/# of Minutes: 2 mintues     Previous Home Environment Living Arrangements: Spouse/significant other;Children Lives With: Spouse Available Help at Discharge: Family;Available 24 hours/day Type of Home: House Home Layout: Two level;Bed/bath upstairs Alternate Level Stairs-Rails: Right Alternate Level Stairs-Number of Steps: 14 Home Access: Stairs to enter Entrance Stairs-Rails:  None Entrance Stairs-Number of Steps: 2 +3 Bathroom Shower/Tub: Tub/shower unit;Curtain Firefighter: Handicapped height Home Care Services: No Additional Comments: Amubulated with hemi-walker indoors and quad cane outdoors. Also used W/C at times.  Discharge Living Setting Plans for Discharge Living Setting: Patient's home;Lives with (comment) (wife an daughter) Type of Home at Discharge: House Discharge Home Layout: Two level;Bed/bath upstairs Alternate Level Stairs-Rails: Right Alternate Level Stairs-Number of Steps: 14 Discharge Home Access: Stairs to enter Entrance Stairs-Rails: None Entrance Stairs-Number of Steps: 2 to 3 Discharge Bathroom Shower/Tub: Tub/shower unit Discharge Bathroom Toilet: Handicapped height Do you have any problems obtaining your medications?: No  Social/Family/Support Systems Patient Roles: Spouse;Parent Contact Information: Hope Budds, wife Anticipated Caregiver: wife and daughter Anticipated Industrial/product designer Information: see above Ability/Limitations of Caregiver: min assist to supervision Caregiver Availability: 24/7 Discharge Plan Discussed with Primary Caregiver: Yes Is Caregiver In Agreement with Plan?: Yes Does Caregiver/Family have Issues with Lodging/Transportation while Pt is in Rehab?: No  Goals/Additional Needs Patient/Family Goal for Rehab: s to min PT and OT Expected length of stay: ELOS 2 to 3 weeks Pt/Family Agrees to Admission and willing to participate: Yes Program Orientation Provided & Reviewed with Pt/Caregiver Including Roles  & Responsibilities: Yes  Patient  Condition: This patient's condition remains as documented in the Consult dated 01/17/12, in which the Rehabilitation Physician determined and documented that the patient's condition is appropriate for intensive rehabilitative care in an inpatient rehabilitation facility.  Preadmission Screen Completed By:  Clois Dupes, 01/17/2012 6:39  PM ______________________________________________________________________   Discussed status with Dr. Wynn Banker on 01/18/12 at 12:20 and received telephone approval for admission today.  Admission Coordinator:  Clois Dupes, time 12:20/Date 01/18/12

## 2012-01-17 NOTE — Progress Notes (Signed)
PT Progress Note:     01/17/12 1107  PT Visit Information  Last PT Received On 01/17/12  Assistance Needed +2  PT Time Calculation  PT Start Time 1022  PT Stop Time 1058  PT Time Calculation (min) 36 min  Subjective Data  Subjective "Im alive"  Patient Stated Goal To go home  Precautions  Precautions Fall;Posterior Hip (Simultaneous filing. User may not have seen previous data.)  Precaution Comments Pt is an old CVA affecting his right side (where now he has the hip replacement). Pt has a muscle stimulator that he is using in a study with Dr. Creig Hines Dr. Pearlean Brownie and he said it was fine for the pt to wear this with then new hip replacement since the impulse stays  locally (does not radiate).  Restrictions  Weight Bearing Restrictions Yes  RLE Weight Bearing WBAT (Simultaneous filing. User may not have seen previous data.)  Cognition  Overall Cognitive Status Appears within functional limits for tasks assessed/performed  Arousal/Alertness Awake/alert  Orientation Level Appears intact for tasks assessed  Behavior During Session [facility] for tasks performed  Bed Mobility  Bed Mobility Supine to Sit;Sitting - Scoot to Edge of Bed  Supine to Sit 2: Max assist;With rails;HOB elevated (HOB elevated ~35 degrees)  Sitting - Scoot to Edge of Bed 4: Min assist  Details for Bed Mobility Assistance Assist for LE's, to lift shoulders/trunk to sitting upright, & use of draw pad to bring hips closer to EOB.  cues for sequencing, Lt UE/hand placement to increase ease of transition, & technique.    Transfers  Transfers Sit to Stand;Stand to Sit;Stand Pivot Transfers  Sit to Stand 1: +2 Total assist;With upper extremity assist;From elevated surface;From bed  Sit to Stand: Patient Percentage 30%  Stand to Sit 1: +2 Total assist;With upper extremity assist;With armrests;To chair/3-in-1  Stand to Sit: Patient Percentage 10%  Stand Pivot Transfers 1: +2 Total assist  Stand Pivot Transfers: Patient  Percentage 30%  Details for Transfer Assistance Pt required greater assist for balance/stability while laterally weight shifting to Rt compared to Lt to advance LLE.  Manual assist to move RLE forwards/backwards.  While weight shifting to Rt with difficulty with muscle activation to maintain knee ext.  Assist to achieve standing, balance, RW management, RLE management, place RUE on platform, & controlled descent.    Ambulation/Gait  Ambulation/Gait Assistance Not tested (comment)  Total Joint Exercises  Heel Slides AAROM;Right;10 reps;Supine  Hip ABduction/ADduction AAROM;10 reps;Right;Supine  Straight Leg Raises AAROM;Right;10 reps  PT - End of Session  Equipment Utilized During Treatment Gait belt  Activity Tolerance Patient tolerated treatment well  Patient left in chair;with call bell/phone within reach  Nurse Communication Mobility status;Other (comment) (Pt's 02 sats throughout session (95% RA))  PT - Assessment/Plan  Comments on Treatment Session Pt very motivated & willing to participate in therapy.  Requiring +2 Total assist for transfers at this date.  Utilized [employer] RW for bed>recliner today.    PT Plan Discharge plan remains appropriate;Frequency remains appropriate  PT Frequency Min 6X/week  Recommendations for Other Services Rehab consult  Follow Up Recommendations Inpatient Rehab;Supervision/Assistance - 24 hour  Equipment Recommended Rolling walker with 5" wheels;Other (comment) (platform attachment)  Acute Rehab PT Goals  Time For Goal Achievement 01/30/12  Potential to Achieve Goals Good  PT Goal: Supine/Side to Sit - Progress Progressing toward goal  PT Goal: Sit to Stand - Progress Progressing toward goal  PT Transfer Goal: Bed to Chair/Chair to Bed - Progress  Progressing toward goal  PT General Charges  $$ ACUTE PT VISIT 1 Procedure  PT Treatments  $Therapeutic Activity 23-37 mins     Richard Kent, [place] [phone] 01/17/2012

## 2012-01-17 NOTE — Discharge Summary (Addendum)
Physician Discharge Summary  Richard Kent:[id number] DOB: [birth year] DOA: 01/12/2012  PCP: Carollee Herter, MD  Admit date: 01/12/2012 Discharge date: 01/17/2012  Recommendations for Outpatient Follow-up:  1. Continue physical and occupational therapy for rehabilitation after fracture. 2. Wean oxygen as tolerated to keep oxygen saturations greater than 92%.  Encourage incentive spirometry. 3. Will defer restarting blood pressure medications to your primary care physician.   4. Continue antibiotics for a 7-day course 5. Weight bearing as tolerated with posterior hip precautions.    Discharge Diagnoses:  Principal Problem:  *Fracture of femoral neck, right Active Problems:  Hypertension  Carotid artery calcification  Hypoxia  RBBB  CAP (community acquired pneumonia)  Carotid stenosis, bilateral   Discharge Condition: stable, improved  Diet recommendation: healthy heart, 2gm sodium  Wt Readings from Last 3 Encounters:  01/17/12 88 kg (194 lb 0.1 oz)  01/17/12 88 kg (194 lb 0.1 oz)  12/27/10 78.926 kg (174 lb)    History of present illness:  Richard Kent is a 78 y.o. male  has a past medical history of Hypertension; CVA (cerebral infarction) (2011); Diverticulosis; Smoker; Carotid stenosis; Kidney stone; and Stroke (11/17/09).   Presented with  With mechanical fall today walking with 4 prong cane. No syncope, no shortness of breath, no chest pain.  He does not get chest pain or shortness of breath with activity. He walks up the stairs and does some exercise with out any cardiac complaints. He had a stroke 2 years ago that has left him with residual right side weakness and some speech problems. On arrival to ER he was found to be mildly hypoxic down to 88%. Hew is a former smoker.    Hospital Course:   Closed Right hip fracture:  Patient underwent right hemiarthroplasty on 01/15/2012, which he tolerated well.  He should continue physical and occupational  therapy.  Pneumonia-Community acquired pneumonia:  Patient was found to have bilateral lower lobe pneumonia on CT angiogram chest, which did not show evidence of PE.  He completed 4 days of ceftriaxone and azithromycin and will transition to moxifloxacin for completion of a 7-day course of antibiotics for pneumonia.  Last day on 01/20/12.    Hypoxemia is likely secondary to pneumonia and possibly some underlying COPD.  He did not have evidence of congestive heart failure or evidence of PE on his CTa. He was placed on oxygen via nasal canula, which should be weaned as tolerated.  He is encouraged to continue aggressive incentive spirometry.  Acute confusion on 8/27 was likely due to pain medication.  Patient was not combative and on 8/28 was alert, oriented to person, place, and time, and reason for hospitalization and conversant.  Minimize pain medication as tolerated while still providing pain control with physical therapy.     Hypertension:  Patient had an episode of hypotension on 8/27 which resolved with discontinuing his blood pressure medications and given a bolus of NS.  Hypotension may have been due to blood loss from surgery superimposed on narcotic pain medications.  Will defer reinitiation of blood pressure medications to his primary care doctor as blood pressure is currently stable off of medications at this time.  No evidence of bleeding and hemoglobin is stable.     Right bundle-branch block:  Dr. Arbutus Leas spoke with Cardiologist Dr. Mayford Knife who recommended no further workup at this time   History of stroke with right hemiparesis:  Patient continued aspirin and plavix.    Carotid stenosis:  Patient was evaluated by vascular  surgeon Dr. Arbie Cookey who recommended no intervention at this time.  Patient should follow up with his primary care doctor to discuss future management.   DVT prophylaxis:  Continue home aspirin and plavix.  May use SCD in addition to this combination, but avoid use of  rivaroxaban in addition to two antiplatelet agents.  High risk of bleeding with trauma.  Aspirin and plavix to be continued indefinitely as part of patient's CVA management.  Consider monotherapy plavix as outpatient pending completion of three weeks of dual therapy.   Procedures: Right hip hemiarthroplasty 01/15/12 CTa chest 8/24 XR hip, chest, right knee  Consultations: Orthopedic surgery Vascular surgery Physical therapy Phone conversation with cardiologist Dr. Mayford Knife  Discharge Exam: Filed Vitals:   01/17/12 1014  BP: 114/64  Pulse: 80  Temp: 98.8 F (37.1 C)  Resp: 18   Filed Vitals:   01/17/12 0343 01/17/12 0503 01/17/12 0800 01/17/12 1014  BP: 130/59 143/71  114/64  Pulse: 72 74  80  Temp:  99 F (37.2 C)  98.8 F (37.1 C)  TempSrc:  Oral  Oral  Resp:  18 18 18   Height:      Weight:  88 kg (194 lb 0.1 oz)    SpO2:  94% 98% 97%    General:  Average weight caucasian male, no acute distress, lying in bed.   HEENT:  PERRL, Moist mucous membranes Cardiovascular:  Regular rate and rhythm with occasional PAC v. PVC.  2+ extremity pulses Respiratory: Diminished breath sounds with rales at bilateral bases, nasal canula in place, no respiratory distress.   ABd:  Soft, nondistended, nontender, no organomegaly Ext:  Dressing on right hip, clean, dry, and intact with mild firmness and bruising posteriorly.  Nonpitting edema of right ankle (mild 1+), < 2 sec CR, and equally warm compared to left foot.  No pain with palpation of the heel or ROM of the right ankle.  No ulcerative changes of the right heel. Psych:  A&Ox4 Neuro:  Right facial droop, 3+/5 RUE and RLE strength, 5-/5 LUE/LLE.  Decreased sensation right side of body  Discharge Instructions  Discharge Orders    Future Orders Please Complete By Expires   Diet - low sodium heart healthy      Weight bearing as tolerated      Increase activity slowly      Discharge instructions      Comments:   You were hospitalized  after you fell and had a hip fracture.  You had a partial hip replacement on the right side.  You were also found to have a pneumonia and were treated with oxygen and antibiotics (ceftriaxone and azithromycin) for 4 days while in the hospital.  You should take another three days of antibiotics (moxifloxacin) and your oxygen can be weaned while you are continuing your rehabilitation.  Please talk to your primary care doctor about your carotid stenosis.    Return to the hospital if you have worsening shortness of breath, increased swelling, pain, redness, or discharge from your hip incision or any other concerning signs or symptoms.   Discharge wound care:      Comments:   Dressing changes as needed.   Call MD for:  temperature >100.4      Call MD for:  persistant nausea and vomiting      Call MD for:  severe uncontrolled pain      Call MD for:  redness, tenderness, or signs of infection (pain, swelling, redness, odor or green/yellow discharge around  incision site)      Call MD for:  difficulty breathing, headache or visual disturbances      Call MD for:  hives      Call MD for:  persistant dizziness or light-headedness      Call MD for:  extreme fatigue        Medication List  As of 01/17/2012 11:06 AM   STOP taking these medications         amLODipine 10 MG tablet      lisinopril 10 MG tablet         TAKE these medications         acetaminophen 325 MG tablet   Commonly known as: TYLENOL   Take 1-2 tablets (325-650 mg total) by mouth every 6 (six) hours as needed (or Fever >/= 101).      albuterol (5 MG/ML) 0.5% nebulizer solution   Commonly known as: PROVENTIL   Take 0.5 mLs (2.5 mg total) by nebulization every 4 (four) hours as needed for wheezing or shortness of breath.      aspirin 81 MG tablet   Take 81 mg by mouth every morning.      baclofen 10 MG tablet   Commonly known as: LIORESAL   Take 10 mg by mouth 3 (three) times daily.      clopidogrel 75 MG tablet   Commonly  known as: PLAVIX   Take 75 mg by mouth every morning.      HYDROcodone-acetaminophen 5-325 MG per tablet   Commonly known as: NORCO/VICODIN   Take 1-2 tablets by mouth every 6 (six) hours as needed.      multivitamin with minerals Tabs   Take 1 tablet by mouth every morning.      SENOKOT S PO   Take 2 tablets by mouth at bedtime.      simvastatin 20 MG tablet   Commonly known as: ZOCOR   Take 20 mg by mouth every evening.      tiZANidine 2 MG tablet   Commonly known as: ZANAFLEX   Take 2 mg by mouth 2 (two) times daily.      VITAMIN C PO   Take 1 tablet by mouth every morning.           Follow-up Information    Schedule an appointment as soon as possible for a visit with Budd Palmer, MD.   Contact information:   [address] [place] [address] [phone]       Follow up with Carollee Herter, MD. Schedule an appointment as soon as possible for a visit in 1 week.   Contact information:   [address] [place] [address] [phone]           The results of significant diagnostics from this hospitalization (including imaging, microbiology, ancillary and laboratory) are listed below for reference.    Significant Diagnostic Studies: Dg Chest 1 View  01/12/2012  *RADIOLOGY REPORT*  Clinical Data: Fall, right hip pain  CHEST - 1 VIEW  Comparison: 10/19/2009  Findings: Increased interstitial markings, possibly chronic. Bibasilar atelectasis.  No pleural effusion or pneumothorax.  The heart is normal in size.  IMPRESSION: No evidence of acute cardiopulmonary disease.   Original Report Authenticated By: Charline Bills, M.D.    Dg Hip Complete Right  01/12/2012  *RADIOLOGY REPORT*  Clinical Data: Fall, right hip pain  RIGHT HIP - COMPLETE 2+ VIEW  Comparison: None.  Findings: Subcapital right hip fracture.  Secondary  varus angulation.  Left hip is unremarkable.  Degenerative changes of the lower lumbar  spine.  IMPRESSION: Subcapital right hip fracture.   Original Report Authenticated By: Charline Bills, M.D.    Ct Angio Chest Pe W/cm &/or Wo Cm  01/13/2012  *RADIOLOGY REPORT*  Clinical Data: 78 year old male.  Right side weakness.  Right hip fracture.  Hypoxia.  CT ANGIOGRAPHY CHEST  Technique:  Multidetector CT imaging of the chest using the standard protocol during bolus administration of intravenous contrast. Multiplanar reconstructed images including MIPs were obtained and reviewed to evaluate the vascular anatomy.  Contrast:  100 ml Omnipaque three-phase D.  Comparison: Chest radiographs 01/12/2012 and earlier.  Findings: Adequate contrast bolus timing in the pulmonary arterial tree.  No focal filling defect identified in the pulmonary arterial tree to suggest the presence of acute pulmonary embolism.  Atelectatic changes to the major airways.  Bilateral lower lobe medial basal segment consolidation with air bronchograms, greater on the left.  Trace bilateral pleural effusions.  Superimposed dependent atelectasis in the upper lobes.  Cardiomegaly.  No pericardial effusion.  No mediastinal lymphadenopathy.  Negative thoracic inlet.  Negative visualized aorta and great vessels except for atherosclerosis.  There is coronary artery atherosclerosis.  Negative visualized upper abdominal viscera.  No acute osseous abnormality identified.  IMPRESSION: 1. No evidence of acute pulmonary embolus. 2.  Bilateral lower lobe pneumonia with trace pleural fluid.   Original Report Authenticated By: Harley Hallmark, M.D.    Dg Pelvis Portable  01/15/2012  *RADIOLOGY REPORT*  Clinical Data: Status post hip replacement.  PORTABLE PELVIS  Comparison: None.  Findings: Right hip prosthesis in satisfactory position and alignment.  No fracture or dislocation seen.  Staple overlying the left superior acetabulum.  Atheromatous arterial calcifications.  IMPRESSION: Satisfactory postoperative appearance of a right hip prosthesis.    Original Report Authenticated By: Darrol Angel, M.D.    Dg Hip Portable 1 View Right  01/15/2012  *RADIOLOGY REPORT*  Clinical Data: Status post right hip replacement.  PORTABLE RIGHT HIP - 1 VIEW  Comparison: Portable pelvis obtained at the same time.  Findings: Again demonstrated is a right hip prosthesis in satisfactory position and alignment.  No fracture or dislocation seen.  IMPRESSION: Satisfactory postoperative appearance of a right hip prosthesis.   Original Report Authenticated By: Darrol Angel, M.D.    Dg Knee Right Port  01/13/2012  *RADIOLOGY REPORT*  Clinical Data: 78 year old male with hip fracture.  Pain.  PORTABLE RIGHT KNEE - 1-2 VIEW  Comparison: Right hip series 01/12/2012.  Findings: Portable AP and cross-table lateral views of the right knee.  No joint effusion.  Mild for age joint space loss.  Patella appears intact.  No acute fracture identified about the right knee. Calcified atherosclerosis in the right lower extremity.  IMPRESSION: No acute fracture or dislocation identified about the right knee.   Original Report Authenticated By: Harley Hallmark, M.D.     Microbiology: Recent Results (from the past 240 hour(s))  URINE CULTURE     Status: Normal   Collection Time   01/13/12  4:38 PM      Component Value Range Status Comment   Specimen Description URINE, CLEAN CATCH   Final    Special Requests NONE   Final    Culture  Setup Time 01/14/2012 03:11   Final    Colony Count NO GROWTH   Final    Culture NO GROWTH   Final    Report Status 01/15/2012 FINAL  Final   CULTURE, BLOOD (ROUTINE X 2)     Status: Normal (Preliminary result)   Collection Time   01/14/12 10:50 AM      Component Value Range Status Comment   Specimen Description BLOOD RIGHT HAND   Final    Special Requests BOTTLES DRAWN AEROBIC ONLY 4CC   Final    Culture  Setup Time 01/14/2012 16:56   Final    Culture     Final    Value:        BLOOD CULTURE RECEIVED NO GROWTH TO DATE CULTURE WILL BE HELD FOR 5  DAYS BEFORE ISSUING A FINAL NEGATIVE REPORT   Report Status PENDING   Incomplete   CULTURE, BLOOD (ROUTINE X 2)     Status: Normal (Preliminary result)   Collection Time   01/14/12 11:05 AM      Component Value Range Status Comment   Specimen Description BLOOD LEFT HAND   Final    Special Requests BOTTLES DRAWN AEROBIC ONLY 6CC   Final    Culture  Setup Time 01/14/2012 16:56   Final    Culture     Final    Value:        BLOOD CULTURE RECEIVED NO GROWTH TO DATE CULTURE WILL BE HELD FOR 5 DAYS BEFORE ISSUING A FINAL NEGATIVE REPORT   Report Status PENDING   Incomplete   SURGICAL PCR SCREEN     Status: Normal   Collection Time   01/15/12 12:12 PM      Component Value Range Status Comment   MRSA, PCR NEGATIVE  NEGATIVE Final    Staphylococcus aureus NEGATIVE  NEGATIVE Final      Labs: Basic Metabolic Panel:  Lab 01/17/12 4540 01/16/12 0613 01/15/12 0550 01/14/12 0638 01/13/12 0730  NA 138 137 136 136 140  K 3.8 3.9 4.0 3.9 4.0  CL 104 103 102 103 104  CO2 26 26 24 23 24   GLUCOSE 100* 119* 91 103* 110*  BUN 15 15 17 18 13   CREATININE 0.78 0.75 0.75 0.85 0.75  CALCIUM 8.5 8.1* 8.9 8.4 8.6  MG -- -- -- -- --  PHOS -- -- -- -- --   Liver Function Tests: No results found for this basename: AST:5,ALT:5,ALKPHOS:5,BILITOT:5,PROT:5,ALBUMIN:5 in the last 168 hours No results found for this basename: LIPASE:5,AMYLASE:5 in the last 168 hours No results found for this basename: AMMONIA:5 in the last 168 hours CBC:  Lab 01/17/12 0748 01/16/12 0613 01/15/12 0550 01/14/12 0638 01/13/12 0730 01/12/12 1934  WBC 9.4 8.3 10.9* 12.6* 14.9* --  NEUTROABS -- -- -- 10.2* -- 16.2*  HGB 12.3* 12.0* 15.0 14.0 15.3 --  HCT 35.0* 33.7* 41.9 40.0 43.6 --  MCV 93.8 92.3 93.9 94.6 94.4 --  PLT 196 174 168 161 182 --   Cardiac Enzymes: No results found for this basename: CKTOTAL:5,CKMB:5,CKMBINDEX:5,TROPONINI:5 in the last 168 hours BNP: BNP (last 3 results) No results found for this basename: PROBNP:3  in the last 8760 hours CBG: No results found for this basename: GLUCAP:5 in the last 168 hours  Time coordinating discharge: 45 minutes  Signed:  Lakeyta Vandenheuvel  [facility] 01/17/2012, 11:06 AM

## 2012-01-17 NOTE — Progress Notes (Signed)
[facility] ([facility])  Subjective: 2 Days Post-Op Procedure(s) (LRB): ARTHROPLASTY BIPOLAR HIP (Right)    Doing well Worked with therapy yesterday No new issues noted   Objective: Current Vitals Blood pressure 143/71, pulse 74, temperature 99 F (37.2 C), temperature source Oral, resp. rate 18, height 6' (1.829 m), weight 88 kg (194 lb 0.1 oz), SpO2 94.00%. Vital signs in last 24 hours: Temp:  [97.9 F (36.6 C)-99 F (37.2 C)] 99 F (37.2 C) (08/28 0503) Pulse Rate:  [56-74] 74  (08/28 0503) Resp:  [18-19] 18  (08/28 0503) BP: (80-143)/(46-71) 143/71 mmHg (08/28 0503) SpO2:  [86 %-100 %] 94 % (08/28 0503) Weight:  [88 kg (194 lb 0.1 oz)] 88 kg (194 lb 0.1 oz) (08/28 0503)  Intake/Output from previous day: 08/27 0701 - 08/28 0700 In: 960 [P.O.:960] Out: 1300 [Urine:1300] Intake/Output      08/27 0701 - 08/28 0700 08/28 0701 - 08/29 0700   P.O. 960    I.V. (mL/kg)     Total Intake(mL/kg) 960 (10.9)    Urine (mL/kg/hr) 1300 (0.6)    Blood     Total Output 1300    Net -340            LABS  Basename 01/16/12 0613 01/15/12 0550  HGB 12.0* 15.0    Basename 01/16/12 0613 01/15/12 0550  WBC 8.3 10.9*  RBC 3.65* 4.46  HCT 33.7* 41.9  PLT 174 168    Basename 01/16/12 0613 01/15/12 0550  NA 137 136  K 3.9 4.0  CL 103 102  CO2 26 24  BUN 15 17  CREATININE 0.75 0.75  GLUCOSE 119* 91  CALCIUM 8.1* 8.9   No results found for this basename: LABPT:2,INR:2 in the last 72 hours    Physical Exam  Gen: NAD, resting comfortably in bed Lungs: anterior fields sound clear  Cardiac:s1 and s2 Abd: + BS Ext: Right Lower Extremity  Dressing clean/dry and intact  Distal motor and sensory functions at baseline  Ext is warm  + DP pulse  Swelling stable   Imaging Dg Pelvis Portable  01/15/2012  *RADIOLOGY REPORT*  Clinical Data: Status post hip replacement.  PORTABLE PELVIS  Comparison: None.  Findings: Right hip prosthesis in satisfactory position  and alignment.  No fracture or dislocation seen.  Staple overlying the left superior acetabulum.  Atheromatous arterial calcifications.  IMPRESSION: Satisfactory postoperative appearance of a right hip prosthesis.   Original Report Authenticated By: Darrol Angel, M.D.    Dg Hip Portable 1 View Right  01/15/2012  *RADIOLOGY REPORT*  Clinical Data: Status post right hip replacement.  PORTABLE RIGHT HIP - 1 VIEW  Comparison: Portable pelvis obtained at the same time.  Findings: Again demonstrated is a right hip prosthesis in satisfactory position and alignment.  No fracture or dislocation seen.  IMPRESSION: Satisfactory postoperative appearance of a right hip prosthesis.   Original Report Authenticated By: Darrol Angel, M.D.     Assessment/Plan: 2 Days Post-Op Procedure(s) (LRB): ARTHROPLASTY BIPOLAR HIP (Right)  78 y/o male s/p fall   1. Displaced R femoral neck fracture POD #2, R hip hemiarthroplasty   WBAT   Posterior hip precautions   Pillow or abduction pillow between legs when in chairs (wheelchair or bedside chair) to prevent hips adducting and internally rotating. Ok to d/c pillow when in bed  Ice prn   Dressing change prn   Therapies today  2. Pain   Low dose narcotics, tylenol 3. DVT/PE prophylaxis   Cover  with lovenox while inpatient   Upon d/c ok with resuming plavix alone, concern with over-anticoagulation with lovenox AND plavix  4. Continue per IM  5. Dispo   Therapies today   Pt seems to be doing well from pain standpoint and clinically  Pt stable from ortho standpoint for transfer to CIR  Mearl Latin, PA-C [facility] [phone] (P) 01/17/2012, 8:29 AM

## 2012-01-17 NOTE — Consult Note (Signed)
Physical Medicine and Rehabilitation Consult Reason for Consult: Right hip fracture Referring Physician: Dr. Arbutus Leas   HPI: Richard Kent is a 78 y.o. male with history of CVA with residual R-HP and aphasia (CIR to SNF 7/11), B-CAS, admitted on 01/13/12 due to  Ground level fall with subsequent right hip pain. Xrays done revealing displaced right subcapital femoral neck fracture and surgical intervention recommended by Dr. Carola Frost.  Patient noted to be hypoxic at admission and cardiology consulted for input. CTA chest done revealing BLL PNA and 2D echo done revealing EF 60-65% with diastolic dysfunction. Started on IV antibiotics for treatment. Cleared for surgery by Dr. Arbie Cookey as family with concerns regarding  Peri/post-operative CVA. On 01/15/12, patient underwent Right hemiarthroplasty by Dr. Carola Frost. Post op WBAT with posterior hip precautions. He has had problems with confusion past surgery ?narcotic related.  PT evaluation done yesterday and patient noted to have pain as well as increased extensor tone RLE limiting mobility.  MD, PT recommending CIR.   Review of Systems  Eyes: Negative for blurred vision and double vision.  Respiratory: Negative for cough and shortness of breath.   Cardiovascular: Negative for chest pain and palpitations.  Gastrointestinal: Negative for heartburn and nausea.  Genitourinary: Positive for frequency. Negative for dysuria.  Musculoskeletal: Positive for joint pain (right hip).  Neurological: Positive for weakness. Negative for headaches.  Psychiatric/Behavioral: Positive for memory loss.   Past Medical History  Diagnosis Date  . Hypertension   . CVA (cerebral infarction) 2011  . Diverticulosis   . Smoker   . Carotid stenosis   . Kidney stone   . Stroke 11/17/09   Past Surgical History  Procedure Date  . Hernia repair     RIGHT  . Appendectomy   . Hip arthroplasty 01/15/2012    Procedure: ARTHROPLASTY BIPOLAR HIP;  Surgeon: Budd Palmer, MD;  Location: [facility]  OR;  Service: Orthopedics;  Laterality: Right;   Family History  Problem Relation Age of Onset  . Cancer Mother   . Stroke Mother   . Heart disease Father    Social History:  Married. Independent with hemi-walker and gait belt for mobility.  Per reports that he quit smoking about 40 years ago. His smoking use included Cigarettes. He has a 40 pack-year smoking history. He has never used smokeless tobacco. He reports that he does not drink alcohol or use illicit drugs.  Allergies: No Known Allergies  Medications Prior to Admission  Medication Sig Dispense Refill  . amLODipine (NORVASC) 10 MG tablet Take 10 mg by mouth every morning.      . Ascorbic Acid (VITAMIN C PO) Take 1 tablet by mouth every morning.       Marland Kitchen aspirin 81 MG tablet Take 81 mg by mouth every morning.       . baclofen (LIORESAL) 10 MG tablet Take 10 mg by mouth 3 (three) times daily.      . clopidogrel (PLAVIX) 75 MG tablet Take 75 mg by mouth every morning.      Marland Kitchen lisinopril (PRINIVIL,ZESTRIL) 10 MG tablet Take 10 mg by mouth every morning.      . Multiple Vitamin (MULTIVITAMIN WITH MINERALS) TABS Take 1 tablet by mouth every morning.      Bernadette Hoit Sodium (SENOKOT S PO) Take 2 tablets by mouth at bedtime.       . simvastatin (ZOCOR) 20 MG tablet Take 20 mg by mouth every evening.      Marland Kitchen tiZANidine (ZANAFLEX) 2 MG tablet Take 2  mg by mouth 2 (two) times daily.        Home: Home Living Lives With: Spouse Available Help at Discharge: Family;Available 24 hours/day Type of Home: House Home Access: Stairs to enter [employer] of Steps: 2 +3 Entrance Stairs-Rails: None Home Layout: Two level;Bed/bath upstairs Alternate Level Stairs-Number of Steps: 14  Alternate Level Stairs-Rails: Right Bathroom Toilet: Handicapped height Home Adaptive Equipment: Tub transfer bench;Quad cane;Wheelchair - manual Psychologist, educational) Additional Comments: Per patient, wife and son, patient ambulated with hemiwalker  indoors and quad cane outdoors.  Also used wheelchair at times.  Patient was slow with ambulation and they used gait belt with patient but he was able to ambulate PTA.  Functional History: Prior Function Bath: Minimal Toileting: Minimal Dressing: Minimal Grooming: Minimal Feeding: Supervision/set-up Able to Take Stairs?: Yes Driving: No Vocation: Retired Functional Status:  Mobility: Bed Mobility Bed Mobility: Rolling Left;Left Sidelying to Sit;Sitting - Scoot to Edge of Bed Rolling Left: 4: Min assist Left Sidelying to Sit: 4: Min assist;With rails;HOB flat Sitting - Scoot to Edge of Bed: 4: Min assist Sit to Supine: 3: Mod assist Transfers Transfers: Sit to Stand;Stand to Sit;Squat Pivot Transfers Sit to Stand: 3: Mod assist;With upper extremity assist;From bed;From elevated surface Sit to Stand: Patient Percentage: 40% Stand to Sit: 3: Mod assist;With upper extremity assist;With armrests;To chair/3-in-1 Stand to Sit: Patient Percentage: 40% Stand Pivot Transfers: 1: +2 Total assist;With armrests Stand Pivot Transfers: Patient Percentage: 50% Squat Pivot Transfers: With upper extremity assistance;With armrests;3: Mod assist Ambulation/Gait Ambulation/Gait Assistance: Not tested (comment) Stairs: No Wheelchair Mobility Wheelchair Mobility: No  ADL:    Cognition: Cognition Arousal/Alertness: Awake/alert Orientation Level: Oriented X4 Cognition Overall Cognitive Status: Appears within functional limits for tasks assessed/performed Arousal/Alertness: Awake/alert Orientation Level: Appears intact for tasks assessed Behavior During Session: [facility] for tasks performed  Blood pressure 143/71, pulse 74, temperature 99 F (37.2 C), temperature source Oral, resp. rate 18, height 6' (1.829 m), weight 88 kg (194 lb 0.1 oz), SpO2 94.00%. Physical Exam  Nursing note and vitals reviewed. Constitutional: He is oriented to person, place, and time. He appears well-developed.  HENT:    Head: Normocephalic and atraumatic.  Eyes: Pupils are equal, round, and reactive to light.  Neck: Normal range of motion. Neck supple.  Cardiovascular: Normal rate.        occasional PVCs.  Pulmonary/Chest: Effort normal. He has rales in the right lower field and the left lower field.  Abdominal: Soft. Bowel sounds are normal.  Musculoskeletal:       Dry dressing right hip.  Neurological: He is alert and oriented to person, place, and time.       Pleasant and appropriate. Follows basic commands without difficulty. Right central 7.  Right hemiparesis UE>LE. RUE grossly 1-2+.  RLE 1-2 with pain inhibition. plantarflexor contracture right heel. DTR's 3+ on the right. Sensory exam grossly in tact. STM deficits. Otherwise fairly appropriate.   Psychiatric: He has a normal mood and affect. His behavior is normal. Judgment and thought content normal.    No results found for this or any previous visit (from the past 24 hour(s)).   Assessment/Plan: Diagnosis: right FNF s/p hip hemi. Hx of prior left CVA with residual right spastic hemiparesis 1. Does the need for close, 24 hr/day medical supervision in concert with the patient's rehab needs make it unreasonable for this patient to be served in a less intensive setting? Yes and Potentially 2. Co-Morbidities requiring supervision/potential complications: htn, 3. Due to bladder management, bowel  management, safety, skin/wound care, disease management, medication administration, pain management and patient education, does the patient require 24 hr/day rehab nursing? Yes 4. Does the patient require coordinated care of a physician, rehab nurse, PT (1-2 hrs/day, 5 days/week) and OT (1-2 hrs/day, 5 days/week) to address physical and functional deficits in the context of the above medical diagnosis(es)? Yes Addressing deficits in the following areas: balance, endurance, locomotion, strength, transferring, bowel/bladder control, bathing, dressing, feeding,  grooming, toileting and psychosocial support 5. Can the patient actively participate in an intensive therapy program of at least 3 hrs of therapy per day at least 5 days per week? Yes 6. The potential for patient to make measurable gains while on inpatient rehab is excellent 7. Anticipated functional outcomes upon discharge from inpatient rehab are supervision to minimal assist with PT, supervision to moderate assist with OT, n/a with SLP. 8. Estimated rehab length of stay to reach the above functional goals is: 2-3 weeks 9. Does the patient have adequate social supports to accommodate these discharge functional goals? Yes 10. Anticipated D/C setting: Home 11. Anticipated post D/C treatments: HH therapy 12. Overall Rehab/Functional Prognosis: good  RECOMMENDATIONS: This patient's condition is appropriate for continued rehabilitative care in the following setting: CIR Patient has agreed to participate in recommended program. Yes Note that insurance prior authorization may be required for reimbursement for recommended care.  Comment:Rehab RN to follow up.   Ivory Broad, MD     01/17/2012

## 2012-01-17 NOTE — Progress Notes (Signed)
Physical Therapy Treatment Patient Details Name: Richard Kent MRN: [id number] DOB: October 21, 1928 Today's Date: 01/17/2012 Time: 4098-1191 PT Time Calculation (min): 15 min  PT Assessment / Plan / Recommendation Comments on Treatment Session  Cont to recommend CIR prior to d/c home to maximize strength, independence with functional mobility, safety    Follow Up Recommendations  Inpatient Rehab;Supervision/Assistance - 24 hour    Barriers to Discharge        Equipment Recommendations  Defer to next venue    Recommendations for Other Services Rehab consult  Frequency Min 6X/week   Plan Discharge plan remains appropriate;Frequency remains appropriate    Precautions / Restrictions Precautions Precautions: Fall;Posterior Hip (Re-educated pt on all 3 hip precautions.  ) Precaution Comments: Pt is an old CVA affecting his right side (where now he has the hip replacement). Pt has a muscle stimulator that he is using in a study with Dr. Creig Hines Dr. Pearlean Brownie and he said it was fine for the pt to wear this with then new hip replacement since the impulse stays  locally (does not radiate). Restrictions Weight Bearing Restrictions: Yes RLE Weight Bearing: Weight bearing as tolerated       Mobility  Bed Mobility Bed Mobility: Sit to Supine Supine to Sit: 2: Max assist;With rails;HOB elevated (HOB elevated ~35 degrees) Sitting - Scoot to Edge of Bed: 4: Min assist Sit to Supine: 1: +2 Total assist;HOB flat Sit to Supine: Patient Percentage: 30% Details for Bed Mobility Assistance: Assist for LE's & to lower/control shoulders/trunk to supine.  Cues for technique & to use LUE as much as possible. Pt using 1/2 bridging technique with LLE & LUE on rail to scoot towards [facility] & adjust draw pad underneath hips.   Transfers Transfers: Sit to Stand;Stand to Sit;Stand Pivot Transfers Sit to Stand: 1: +2 Total assist;With upper extremity assist;From chair/3-in-1;With armrests Sit to Stand: Patient  Percentage: 20% Stand to Sit: 1: +2 Total assist;With upper extremity assist;To bed Stand to Sit: Patient Percentage: 10% Stand Pivot Transfers: 1: +2 Total assist Stand Pivot Transfers: Patient Percentage: 30% Details for Transfer Assistance: used RUE platform RW.  Cues for initiation, LUE placement, LE positioning, LE sequencing during pivot, RW management.  Assist to achieve standing, anterior weight shifting forward over BOS, balance, RW management, lateral weight shifting to unload LE's for advancement, tall posture, & controlled descent.  Significant increase in assistance needed when shifting weight to RLE for balance.  RLE buckling.   Ambulation/Gait Ambulation/Gait Assistance: Not tested (comment)    Exercises Total Joint Exercises Heel Slides: AAROM;Right;10 reps;Supine Hip ABduction/ADduction: AAROM;10 reps;Right;Supine Straight Leg Raises: AAROM;Right;10 reps     PT Goals Acute Rehab PT Goals Time For Goal Achievement: 01/30/12 Potential to Achieve Goals: Good PT Goal: Supine/Side to Sit - Progress: Progressing toward goal PT Goal: Sit to Stand - Progress: Not met PT Transfer Goal: Bed to Chair/Chair to Bed - Progress: Not met  Visit Information  Last PT Received On: 01/17/12 Assistance Needed: +2    Subjective Data  Subjective: "Im alive" Patient Stated Goal: To go home   Cognition  Overall Cognitive Status: Appears within functional limits for tasks assessed/performed Arousal/Alertness: Awake/alert Orientation Level: Appears intact for tasks assessed Behavior During Session: [facility] for tasks performed    Balance     End of Session PT - End of Session Equipment Utilized During Treatment: Gait belt Activity Tolerance: Patient tolerated treatment well Patient left: in bed;with call bell/phone within reach Nurse Communication: Mobility status    [employer]  [place], [place] [phone] 01/17/2012

## 2012-01-17 NOTE — Progress Notes (Signed)
CSW spoke to patient's wife with patient's permission. [name] reported being confused about inpatient rehab. CSW validated the patient's feelings and explained that PT recommended inpatient rehab for the patient which is why they are currently evaluating the patient.  [name] reported that she would like to see what the MD from inpatient rehab says and if it doesn't work out the patient can be transferred to [facility] for continued rehab. CSW will continue to follow and will assist with all d/c needs.  Sabino Niemann, MSW, [employer] [phone]

## 2012-01-17 NOTE — Evaluation (Signed)
Occupational Therapy Evaluation Patient Details Name: Richard Kent MRN: [id number] DOB: 08/25/1928 Today's Date: 01/17/2012 Time: 6213-0865 OT Time Calculation (min): 34 min  OT Assessment / Plan / Recommendation Clinical Impression  This 78 yo s/p fall and now with RTHA and old CVA affecting right side presents to acute OT with problems below. WIll benefit from acute OT with follow up at inpatient rehab    OT Assessment  Patient needs continued OT Services    Follow Up Recommendations  Inpatient Rehab    Barriers to Discharge None    Equipment Recommendations  Defer to next venue    Recommendations for Other Services    Frequency  Min 2X/week    Precautions / Restrictions Precautions Precautions: Fall;Posterior Hip (Simultaneous filing. User may not have seen previous data.) Precaution Comments: Pt is an old CVA affecting his right side (where now he has the hip replacement). Pt has a muscle stimulator that he is using in a study with Dr. Creig Hines Dr. Pearlean Brownie and he said it was fine for the pt to wear this with then new hip replacement since the impulse stays  locally (does not radiate). Restrictions Weight Bearing Restrictions: Yes RLE Weight Bearing: Weight bearing as tolerated (Simultaneous filing. User may not have seen previous data.)   Pertinent Vitals/Pain 6/10 right hip    ADL  Eating/Feeding: Simulated;Set up Where Assessed - Eating/Feeding: Chair;Bed level Grooming: Simulated;Set up Where Assessed - Grooming: Supported sitting Upper Body Bathing: Simulated;Minimal assistance Where Assessed - Upper Body Bathing: Supported sitting Lower Body Bathing: Simulated;+1 Total assistance (with +2 sit to stand (pt=30%) from raised surface) Where Assessed - Lower Body Bathing: Supported sit to stand Upper Body Dressing: Simulated;Moderate assistance Where Assessed - Upper Body Dressing: Supported sitting Lower Body Dressing: Simulated;+1 Total assistance (with +2 sit  to stand (pt=30%) from raised surface) Where Assessed - Lower Body Dressing: Supported sit to Pharmacist, hospital: Chief of Staff: Patient Percentage: 30% Statistician Method: Sit to stand;Stand pivot Acupuncturist:  (Bed (raised) to recliner about 1 foot apart) Toileting - Clothing Manipulation and Hygiene: Simulated;+1 Total assistance Where Assessed - Glass blower/designer Manipulation and Hygiene: Standing Equipment Used: Rolling walker;Gait belt Transfers/Ambulation Related to ADLs: Total A +2 (pt=30%) sit to stand from raised bed and (10%) stand to sit in recliner ADL Comments: Pt was only min A at the worst with BADLs prior to this hip surgery, now with hip precautions pt is greatly limited with BADLs    OT Diagnosis: Generalized weakness;Hemiplegia dominant side;Acute pain  OT Problem List: Decreased strength;Decreased activity tolerance;Impaired balance (sitting and/or standing);Pain;Impaired UE functional use;Decreased knowledge of use of DME or AE OT Treatment Interventions: Self-care/ADL training;DME and/or AE instruction;Therapeutic activities;Patient/family education;Balance training   OT Goals Acute Rehab OT Goals OT Goal Formulation: With patient Time For Goal Achievement: 01/24/12 Potential to Achieve Goals: Good ADL Goals Pt Will Transfer to Toilet: with mod assist;Ambulation;with DME;3-in-1 ADL Goal: Toilet Transfer - Progress: Goal set today Miscellaneous OT Goals Miscellaneous OT Goal #1: Pt will be able to come up to sit EOB with Mod A to help with BADLs and transfers. OT Goal: Miscellaneous Goal #1 - Progress: Goal set today Miscellaneous OT Goal #2: Pt will be mod A sit to stand from raised surface to A with transfers. OT Goal: Miscellaneous Goal #2 - Progress: Goal set today  Visit Information  Last OT Received On: 01/17/12 Assistance Needed: +2 PT/OT Co-Evaluation/Treatment: Yes    Subjective Data  Subjective:  I  could (get his right sock, shoe, and AFO on Patient Stated Goal: To be able to walk and use this arm (right) again--old CVA ( 2 years ago)   Prior Functioning  Vision/Perception  Home Living Lives With: Spouse Available Help at Discharge: Family;Available 24 hours/day Type of Home: House Home Access: Stairs to enter [employer] of Steps: 2 +3 Entrance Stairs-Rails: None Home Layout: Two level;Bed/bath upstairs Alternate Level Stairs-Number of Steps: 14 Alternate Level Stairs-Rails: Right Bathroom Shower/Tub: Tub/shower unit;Curtain Bathroom Toilet: Handicapped height Home Adaptive Equipment: Tub transfer bench;Quad cane;Wheelchair - manual (hemi-walker) Additional Comments: Amubulated with hemi-walker indoors and quad cane outdoors. Also used W/C at times. Prior Function Level of Independence: Needs assistance Bath: Minimal Dressing: Minimal Feeding: Supervision/set-up Grooming: Minimal Toileting: Minimal Able to Take Stairs?: Yes Driving: No Vocation: Retired Musician: No difficulties Dominant Hand: Right (but now left due to CVA 2 years ago)      Cognition  Overall Cognitive Status: Appears within functional limits for tasks assessed/performed Arousal/Alertness: Awake/alert Orientation Level: Appears intact for tasks assessed Behavior During Session: [facility] for tasks performed    Extremity/Trunk Assessment Right Upper Extremity Assessment RUE ROM/Strength/Tone: Deficits RUE ROM/Strength/Tone Deficits: Old CVA, Brunstrum II Left Upper Extremity Assessment LUE ROM/Strength/Tone: Within functional levels   Mobility  Shoulder Instructions  Bed Mobility Details for Bed Mobility Assistance: See PT note for A and details Transfers Details for Transfer Assistance: See PT note for A and details       Exercise     Balance     End of Session OT - End of Session Equipment Utilized During Treatment: Gait belt (PFRW) Activity Tolerance:  Patient limited by fatigue;Patient limited by pain Patient left: in chair;with call bell/phone within reach Nurse Communication: Mobility status (NT, PT is going to try and come back to help pt back to bed)       Evette Georges [phone] 01/17/2012, 11:26 AM

## 2012-01-17 NOTE — Progress Notes (Signed)
[facility] PROGRESS NOTE  [name] OZH:[id number] DOB: [birth year] DOA: 01/12/2012 PCP: Carollee Herter, MD  Assessment/Plan: Closed Right hip fracture  -Status post right hemiarthroplasty 01/15/2012 -Continue PT/OT  Pneumonia-CAP  -Continue cetriaxone/azithromycin day #4.  Plan to transition to moxifloxacin  Hypoxemia  -Stable/improving, continue supplemental oxygen  -Likely combination of pneumonia and underlying COPD  -50pk yr hx of tobacco   Acute confusion, resolved. -likely due to norco and hydromorphone -minimizing narcotics  Hypertension, currently holding some BP meds secondary to hypotension yesterday. -  Restart BP meds as BP tolerates.  Right bundle-branch block  -Discussed with Dr. Mayford Knife, no further workup at this time   History of stroke with right hemiparesis  -No active TIA symptoms at this time  -Continue aspirin/ Plavix   Carotid stenosis  -Appreciate Dr. Bosie Helper recommendations, no further intervention necessary at this time  DVT prophylaxis  -Orthopedics okay with aspirin/Plavix -Concerned expressed overt bleeding risk of using rivaroxaban in addition to aspirin/Plavix  Disposition:  DC to [facility] when cleared by orthopedics for PT/OT, hopeful for Tomorrow  --updated wife today with plan.  Procedures/Studies: Dg Chest 1 View  01/12/2012  *RADIOLOGY REPORT*  Clinical Data: Fall, right hip pain  CHEST - 1 VIEW  Comparison: 10/19/2009  Findings: Increased interstitial markings, possibly chronic. Bibasilar atelectasis.  No pleural effusion or pneumothorax.  The heart is normal in size.  IMPRESSION: No evidence of acute cardiopulmonary disease.   Original Report Authenticated By: Charline Bills, M.D.    Dg Hip Complete Right  01/12/2012  *RADIOLOGY REPORT*  Clinical Data: Fall, right hip pain  RIGHT HIP - COMPLETE 2+ VIEW  Comparison: None.  Findings: Subcapital right hip fracture.  Secondary varus angulation.  Left hip is  unremarkable.  Degenerative changes of the lower lumbar spine.  IMPRESSION: Subcapital right hip fracture.   Original Report Authenticated By: Charline Bills, M.D.    Ct Angio Chest Pe W/cm &/or Wo Cm  01/13/2012  *RADIOLOGY REPORT*  Clinical Data: 78 year old male.  Right side weakness.  Right hip fracture.  Hypoxia.  CT ANGIOGRAPHY CHEST  Technique:  Multidetector CT imaging of the chest using the standard protocol during bolus administration of intravenous contrast. Multiplanar reconstructed images including MIPs were obtained and reviewed to evaluate the vascular anatomy.  Contrast:  100 ml Omnipaque three-phase D.  Comparison: Chest radiographs 01/12/2012 and earlier.  Findings: Adequate contrast bolus timing in the pulmonary arterial tree.  No focal filling defect identified in the pulmonary arterial tree to suggest the presence of acute pulmonary embolism.  Atelectatic changes to the major airways.  Bilateral lower lobe medial basal segment consolidation with air bronchograms, greater on the left.  Trace bilateral pleural effusions.  Superimposed dependent atelectasis in the upper lobes.  Cardiomegaly.  No pericardial effusion.  No mediastinal lymphadenopathy.  Negative thoracic inlet.  Negative visualized aorta and great vessels except for atherosclerosis.  There is coronary artery atherosclerosis.  Negative visualized upper abdominal viscera.  No acute osseous abnormality identified.  IMPRESSION: 1. No evidence of acute pulmonary embolus. 2.  Bilateral lower lobe pneumonia with trace pleural fluid.   Original Report Authenticated By: Harley Hallmark, M.D.    Dg Pelvis Portable  01/15/2012  *RADIOLOGY REPORT*  Clinical Data: Status post hip replacement.  PORTABLE PELVIS  Comparison: None.  Findings: Right hip prosthesis in satisfactory position and alignment.  No fracture or dislocation seen.  Staple overlying the left superior acetabulum.  Atheromatous arterial calcifications.  IMPRESSION:  Satisfactory postoperative appearance of a  right hip prosthesis.   Original Report Authenticated By: Darrol Angel, M.D.    Dg Hip Portable 1 View Right  01/15/2012  *RADIOLOGY REPORT*  Clinical Data: Status post right hip replacement.  PORTABLE RIGHT HIP - 1 VIEW  Comparison: Portable pelvis obtained at the same time.  Findings: Again demonstrated is a right hip prosthesis in satisfactory position and alignment.  No fracture or dislocation seen.  IMPRESSION: Satisfactory postoperative appearance of a right hip prosthesis.   Original Report Authenticated By: Darrol Angel, M.D.    Dg Knee Right Port  01/13/2012  *RADIOLOGY REPORT*  Clinical Data: 78 year old male with hip fracture.  Pain.  PORTABLE RIGHT KNEE - 1-2 VIEW  Comparison: Right hip series 01/12/2012.  Findings: Portable AP and cross-table lateral views of the right knee.  No joint effusion.  Mild for age joint space loss.  Patella appears intact.  No acute fracture identified about the right knee. Calcified atherosclerosis in the right lower extremity.  IMPRESSION: No acute fracture or dislocation identified about the right knee.   Original Report Authenticated By: Harley Hallmark, M.D.     Procedures:  Right hip hemiarthroplasty August 26  Antibiotics:  Ceftriaxone August 25  Azithromycin August 25   Code Status: Full   Subjective: Patient continues to have pain in the right hip today. He denies any chest pain, shortness of breath, nausea, vomiting, diarrhea, abdominal pain. He denies any fevers, chills.  Objective: Filed Vitals:   01/17/12 1147 01/17/12 1430 01/17/12 1600 01/17/12 2051  BP:  125/56  149/60  Pulse:  75  83  Temp:  98.4 F (36.9 C)  99.3 F (37.4 C)  TempSrc:  Oral  Oral  Resp: 18 19 18 18   Height:      Weight:      SpO2: 96% 92% 96% 94%    Intake/Output Summary (Last 24 hours) at 01/17/12 2131 Last data filed at 01/17/12 1900  Gross per 24 hour  Intake    720 ml  Output    885 ml  Net   -165  ml   Weight change: 0.274 kg (9.7 oz) Exam:   General:  CM, no acute distress.   HEENT: [place]/AT, MMM  Cardiovascular: Regular rate and rhythm, S1/S2, , no rubs, no gallops  Respiratory: Clear to auscultation bilaterally, no wheezing, no crackles, no rhonchi  Abdomen: Soft, non tender, non distended, bowel sounds present, no guarding  Extremities: No edema, pulses DP and PT palpable bilaterally.  Right thigh with bruising and bandage C/D/I.  Pain with ROM of right hip.  Data Reviewed: Basic Metabolic Panel:  Lab 01/17/12 0981 01/16/12 1914 01/15/12 0550 01/14/12 0638 01/13/12 0730  NA 138 137 136 136 140  K 3.8 3.9 4.0 3.9 4.0  CL 104 103 102 103 104  CO2 26 26 24 23 24   GLUCOSE 100* 119* 91 103* 110*  BUN 15 15 17 18 13   CREATININE 0.78 0.75 0.75 0.85 0.75  CALCIUM 8.5 8.1* 8.9 8.4 8.6  MG -- -- -- -- --  PHOS -- -- -- -- --   Liver Function Tests: No results found for this basename: AST:5,ALT:5,ALKPHOS:5,BILITOT:5,PROT:5,ALBUMIN:5 in the last 168 hours No results found for this basename: LIPASE:5,AMYLASE:5 in the last 168 hours No results found for this basename: AMMONIA:5 in the last 168 hours CBC:  Lab 01/17/12 0748 01/16/12 0613 01/15/12 0550 01/14/12 0638 01/13/12 0730 01/12/12 1934  WBC 9.4 8.3 10.9* 12.6* 14.9* --  NEUTROABS -- -- -- 10.2* --  16.2*  HGB 12.3* 12.0* 15.0 14.0 15.3 --  HCT 35.0* 33.7* 41.9 40.0 43.6 --  MCV 93.8 92.3 93.9 94.6 94.4 --  PLT 196 174 168 161 182 --   Cardiac Enzymes: No results found for this basename: CKTOTAL:5,CKMB:5,CKMBINDEX:5,TROPONINI:5 in the last 168 hours BNP: No components found with this basename: POCBNP:5 CBG: No results found for this basename: GLUCAP:5 in the last 168 hours  Recent Results (from the past 240 hour(s))  URINE CULTURE     Status: Normal   Collection Time   01/13/12  4:38 PM      Component Value Range Status Comment   Specimen Description URINE, CLEAN CATCH   Final    Special Requests NONE   Final     Culture  Setup Time 01/14/2012 03:11   Final    Colony Count NO GROWTH   Final    Culture NO GROWTH   Final    Report Status 01/15/2012 FINAL   Final   CULTURE, BLOOD (ROUTINE X 2)     Status: Normal (Preliminary result)   Collection Time   01/14/12 10:50 AM      Component Value Range Status Comment   Specimen Description BLOOD RIGHT HAND   Final    Special Requests BOTTLES DRAWN AEROBIC ONLY 4CC   Final    Culture  Setup Time 01/14/2012 16:56   Final    Culture     Final    Value:        BLOOD CULTURE RECEIVED NO GROWTH TO DATE CULTURE WILL BE HELD FOR 5 DAYS BEFORE ISSUING A FINAL NEGATIVE REPORT   Report Status PENDING   Incomplete   CULTURE, BLOOD (ROUTINE X 2)     Status: Normal (Preliminary result)   Collection Time   01/14/12 11:05 AM      Component Value Range Status Comment   Specimen Description BLOOD LEFT HAND   Final    Special Requests BOTTLES DRAWN AEROBIC ONLY 6CC   Final    Culture  Setup Time 01/14/2012 16:56   Final    Culture     Final    Value:        BLOOD CULTURE RECEIVED NO GROWTH TO DATE CULTURE WILL BE HELD FOR 5 DAYS BEFORE ISSUING A FINAL NEGATIVE REPORT   Report Status PENDING   Incomplete   SURGICAL PCR SCREEN     Status: Normal   Collection Time   01/15/12 12:12 PM      Component Value Range Status Comment   MRSA, PCR NEGATIVE  NEGATIVE Final    Staphylococcus aureus NEGATIVE  NEGATIVE Final      Scheduled Meds:    . antiseptic oral rinse  15 mL Mouth Rinse BID  . aspirin  81 mg Oral Daily  . azithromycin  500 mg Oral Daily  . baclofen  10 mg Oral TID  . cefTRIAXone (ROCEPHIN)  IV  1 g Intravenous Q24H  . clopidogrel  75 mg Oral Q breakfast  . enoxaparin (LOVENOX) injection  40 mg Subcutaneous Q24H  . senna-docusate  2 tablet Oral QHS  . simvastatin  20 mg Oral QPM  . tiZANidine  2 mg Oral BID   Continuous Infusions:    Renae Fickle, DO  [facility] Pager [phone]  If 7PM-7AM, please contact  night-coverage [email or web address] Password [facility] 01/17/2012, 9:31 PM   LOS: 5 days

## 2012-01-17 NOTE — Progress Notes (Signed)
I met with patient at bedside and contacted his wife by phone. Patient previously received inpt rehab in 2011 after his CVA and then to [facility] for rehab for 74 days. Patient and wife prefer intense inpt rehab here to assess his progress to be able to go directly home. If he does not make the needed progress, then further rehab at SNF can be pursued. We can admit pt tomorrow. [phone].

## 2012-01-18 ENCOUNTER — Inpatient Hospital Stay (HOSPITAL_COMMUNITY)
Admission: RE | Admit: 2012-01-18 | Discharge: 2012-02-09 | DRG: 945 | Disposition: A | Discharge status: Home / Self Care | Payer: Medicare Other | Source: Ambulatory Visit | Attending: Physical Medicine & Rehabilitation | Admitting: Physical Medicine & Rehabilitation

## 2012-01-18 DIAGNOSIS — K5900 Constipation, unspecified: Secondary | ICD-10-CM | POA: Diagnosis not present

## 2012-01-18 DIAGNOSIS — Z823 Family history of stroke: Secondary | ICD-10-CM | POA: Diagnosis not present

## 2012-01-18 DIAGNOSIS — D62 Acute posthemorrhagic anemia: Secondary | ICD-10-CM

## 2012-01-18 DIAGNOSIS — Z8249 Family history of ischemic heart disease and other diseases of the circulatory system: Secondary | ICD-10-CM | POA: Diagnosis not present

## 2012-01-18 DIAGNOSIS — K5730 Diverticulosis of large intestine without perforation or abscess without bleeding: Secondary | ICD-10-CM | POA: Diagnosis not present

## 2012-01-18 DIAGNOSIS — Z96649 Presence of unspecified artificial hip joint: Secondary | ICD-10-CM | POA: Diagnosis not present

## 2012-01-18 DIAGNOSIS — J9819 Other pulmonary collapse: Secondary | ICD-10-CM

## 2012-01-18 DIAGNOSIS — Z5189 Encounter for other specified aftercare: Principal | ICD-10-CM

## 2012-01-18 DIAGNOSIS — R079 Chest pain, unspecified: Secondary | ICD-10-CM | POA: Diagnosis not present

## 2012-01-18 DIAGNOSIS — Z87891 Personal history of nicotine dependence: Secondary | ICD-10-CM

## 2012-01-18 DIAGNOSIS — S72009A Fracture of unspecified part of neck of unspecified femur, initial encounter for closed fracture: Secondary | ICD-10-CM

## 2012-01-18 DIAGNOSIS — M25559 Pain in unspecified hip: Secondary | ICD-10-CM | POA: Diagnosis not present

## 2012-01-18 DIAGNOSIS — S72033A Displaced midcervical fracture of unspecified femur, initial encounter for closed fracture: Secondary | ICD-10-CM

## 2012-01-18 DIAGNOSIS — I69959 Hemiplegia and hemiparesis following unspecified cerebrovascular disease affecting unspecified side: Secondary | ICD-10-CM

## 2012-01-18 DIAGNOSIS — W1809XA Striking against other object with subsequent fall, initial encounter: Secondary | ICD-10-CM

## 2012-01-18 DIAGNOSIS — G8110 Spastic hemiplegia affecting unspecified side: Secondary | ICD-10-CM | POA: Diagnosis not present

## 2012-01-18 DIAGNOSIS — R05 Cough: Secondary | ICD-10-CM | POA: Diagnosis not present

## 2012-01-18 DIAGNOSIS — Z471 Aftercare following joint replacement surgery: Secondary | ICD-10-CM | POA: Diagnosis not present

## 2012-01-18 DIAGNOSIS — S72001A Fracture of unspecified part of neck of right femur, initial encounter for closed fracture: Secondary | ICD-10-CM | POA: Diagnosis present

## 2012-01-18 DIAGNOSIS — I10 Essential (primary) hypertension: Secondary | ICD-10-CM | POA: Diagnosis not present

## 2012-01-18 DIAGNOSIS — J189 Pneumonia, unspecified organism: Secondary | ICD-10-CM | POA: Diagnosis not present

## 2012-01-18 DIAGNOSIS — I69920 Aphasia following unspecified cerebrovascular disease: Secondary | ICD-10-CM

## 2012-01-18 DIAGNOSIS — Y92009 Unspecified place in unspecified non-institutional (private) residence as the place of occurrence of the external cause: Secondary | ICD-10-CM

## 2012-01-18 LAB — CBC
MCH: 32.8 pg (ref 26.0–34.0)
Platelets: 225 10*3/uL (ref 150–400)
RBC: 3.48 MIL/uL — ABNORMAL LOW (ref 4.22–5.81)
WBC: 9.1 10*3/uL (ref 4.0–10.5)

## 2012-01-18 MED ORDER — ALUM & MAG HYDROXIDE-SIMETH 200-200-20 MG/5ML PO SUSP: 30.0000 mL | ORAL | Status: DC | PRN

## 2012-01-18 MED ORDER — FLEET ENEMA 7-19 GM/118ML RE ENEM: 1.0000 | ENEMA | Freq: Once | RECTAL | Status: DC

## 2012-01-18 MED ORDER — PROCHLORPERAZINE MALEATE 5 MG PO TABS
5.0000 mg | ORAL_TABLET | Freq: Four times a day (QID) | ORAL | Status: DC | PRN
  Filled 2012-01-18: qty 2

## 2012-01-18 MED ORDER — TRAZODONE HCL 50 MG PO TABS
25.0000 mg | ORAL_TABLET | Freq: Every evening | ORAL | Status: DC | PRN
  Filled 2012-01-18: qty 1

## 2012-01-18 MED ORDER — ALBUTEROL SULFATE (5 MG/ML) 0.5% IN NEBU: 2.5000 mg | INHALATION_SOLUTION | RESPIRATORY_TRACT | Status: DC | PRN

## 2012-01-18 MED ORDER — MENTHOL 3 MG MT LOZG
1.0000 | LOZENGE | OROMUCOSAL | Status: DC | PRN
Start: 2012-01-18 — End: 2012-02-09
  Filled 2012-01-18: qty 9

## 2012-01-18 MED ORDER — PROCHLORPERAZINE 25 MG RE SUPP
12.5000 mg | Freq: Four times a day (QID) | RECTAL | Status: DC | PRN
  Filled 2012-01-18: qty 1

## 2012-01-18 MED ORDER — METHOCARBAMOL 500 MG PO TABS
500.0000 mg | ORAL_TABLET | Freq: Four times a day (QID) | ORAL | Status: DC | PRN
  Administered 2012-01-19 – 2012-02-04 (×7): 500 mg via ORAL
  Filled 2012-01-18 (×7): qty 1

## 2012-01-18 MED ORDER — PHENOL 1.4 % MT LIQD
1.0000 | OROMUCOSAL | Status: DC | PRN
  Filled 2012-01-18: qty 177

## 2012-01-18 MED ORDER — HYDROCODONE-ACETAMINOPHEN 5-325 MG PO TABS
1.0000 | ORAL_TABLET | Freq: Four times a day (QID) | ORAL | Status: DC | PRN
  Administered 2012-01-18 – 2012-01-19 (×5): 2 via ORAL
  Administered 2012-01-20: 1 via ORAL
  Administered 2012-01-20 – 2012-01-23 (×7): 2 via ORAL
  Administered 2012-01-23 (×2): 1 via ORAL
  Administered 2012-01-24 (×2): 2 via ORAL
  Administered 2012-01-24 – 2012-01-25 (×2): 1 via ORAL
  Administered 2012-01-25: 2 via ORAL
  Administered 2012-01-26 – 2012-01-30 (×10): 1 via ORAL
  Administered 2012-01-30 – 2012-01-31 (×3): 2 via ORAL
  Administered 2012-02-01 – 2012-02-06 (×9): 1 via ORAL
  Administered 2012-02-06 (×2): 2 via ORAL
  Administered 2012-02-06: 1 via ORAL
  Administered 2012-02-07: 2 via ORAL
  Administered 2012-02-07 – 2012-02-08 (×3): 1 via ORAL
  Administered 2012-02-08: 2 via ORAL
  Administered 2012-02-09: 1 via ORAL
  Filled 2012-01-18 (×2): qty 2
  Filled 2012-01-18: qty 1
  Filled 2012-01-18: qty 2
  Filled 2012-01-18 (×3): qty 1
  Filled 2012-01-18 (×3): qty 2
  Filled 2012-01-18 (×6): qty 1
  Filled 2012-01-18 (×2): qty 2
  Filled 2012-01-18: qty 1
  Filled 2012-01-18 (×3): qty 2
  Filled 2012-01-18 (×3): qty 1
  Filled 2012-01-18: qty 2
  Filled 2012-01-18: qty 1
  Filled 2012-01-18: qty 2
  Filled 2012-01-18 (×3): qty 1
  Filled 2012-01-18 (×2): qty 2
  Filled 2012-01-18: qty 1
  Filled 2012-01-18 (×2): qty 2
  Filled 2012-01-18 (×3): qty 1
  Filled 2012-01-18 (×3): qty 2
  Filled 2012-01-18 (×2): qty 1
  Filled 2012-01-18 (×2): qty 2
  Filled 2012-01-18: qty 1
  Filled 2012-01-18: qty 2
  Filled 2012-01-18 (×2): qty 1
  Filled 2012-01-18 (×3): qty 2
  Filled 2012-01-18: qty 1
  Filled 2012-01-18: qty 2

## 2012-01-18 MED ORDER — ENOXAPARIN SODIUM 40 MG/0.4ML ~~LOC~~ SOLN
40.0000 mg | SUBCUTANEOUS | Status: DC
  Administered 2012-01-18 – 2012-02-08 (×22): 40 mg via SUBCUTANEOUS
  Filled 2012-01-18 (×23): qty 0.4

## 2012-01-18 MED ORDER — POLYETHYLENE GLYCOL 3350 17 G PO PACK
17.0000 g | PACK | Freq: Every day | ORAL | Status: DC
  Administered 2012-01-20 – 2012-02-06 (×8): 17 g via ORAL
  Filled 2012-01-18 (×24): qty 1

## 2012-01-18 MED ORDER — BACLOFEN 10 MG PO TABS
10.0000 mg | ORAL_TABLET | Freq: Three times a day (TID) | ORAL | Status: DC
  Administered 2012-01-18 – 2012-01-28 (×29): 10 mg via ORAL
  Administered 2012-01-28: 5 mg via ORAL
  Administered 2012-01-28 – 2012-02-09 (×35): 10 mg via ORAL
  Filled 2012-01-18 (×70): qty 1

## 2012-01-18 MED ORDER — DIPHENHYDRAMINE HCL 12.5 MG/5ML PO ELIX: 12.5000 mg | ORAL_SOLUTION | Freq: Four times a day (QID) | ORAL | Status: DC | PRN

## 2012-01-18 MED ORDER — GUAIFENESIN-DM 100-10 MG/5ML PO SYRP: 5.0000 mL | ORAL_SOLUTION | Freq: Four times a day (QID) | ORAL | Status: DC | PRN

## 2012-01-18 MED ORDER — POLYETHYLENE GLYCOL 3350 17 G PO PACK
68.0000 g | PACK | Freq: Once | ORAL | Status: AC
  Administered 2012-01-18: 68 g via ORAL
  Filled 2012-01-18 (×2): qty 4

## 2012-01-18 MED ORDER — CLOPIDOGREL BISULFATE 75 MG PO TABS
75.0000 mg | ORAL_TABLET | Freq: Every day | ORAL | Status: DC
  Administered 2012-01-19 – 2012-02-09 (×22): 75 mg via ORAL
  Filled 2012-01-18 (×23): qty 1

## 2012-01-18 MED ORDER — ASPIRIN 81 MG PO CHEW
81.0000 mg | CHEWABLE_TABLET | Freq: Every day | ORAL | Status: DC
  Administered 2012-01-19 – 2012-02-09 (×22): 81 mg via ORAL
  Filled 2012-01-18 (×22): qty 1

## 2012-01-18 MED ORDER — SIMVASTATIN 20 MG PO TABS
20.0000 mg | ORAL_TABLET | Freq: Every evening | ORAL | Status: DC
  Administered 2012-01-18 – 2012-02-08 (×22): 20 mg via ORAL
  Filled 2012-01-18 (×23): qty 1

## 2012-01-18 MED ORDER — TIZANIDINE HCL 2 MG PO TABS
2.0000 mg | ORAL_TABLET | Freq: Two times a day (BID) | ORAL | Status: DC
  Administered 2012-01-18 – 2012-02-09 (×44): 2 mg via ORAL
  Filled 2012-01-18 (×48): qty 1

## 2012-01-18 MED ORDER — PROCHLORPERAZINE EDISYLATE 5 MG/ML IJ SOLN
5.0000 mg | Freq: Four times a day (QID) | INTRAMUSCULAR | Status: DC | PRN
  Filled 2012-01-18: qty 2

## 2012-01-18 MED ORDER — MOXIFLOXACIN HCL 400 MG PO TABS
400.0000 mg | ORAL_TABLET | Freq: Every day | ORAL | Status: AC
  Administered 2012-01-19 – 2012-01-23 (×5): 400 mg via ORAL
  Filled 2012-01-18 (×5): qty 1

## 2012-01-18 MED ORDER — AZITHROMYCIN 500 MG PO TABS
500.0000 mg | ORAL_TABLET | Freq: Every day | ORAL | Status: AC
  Administered 2012-01-19 – 2012-02-01 (×14): 500 mg via ORAL
  Filled 2012-01-18 (×14): qty 1

## 2012-01-18 MED ORDER — ACETAMINOPHEN 325 MG PO TABS
325.0000 mg | ORAL_TABLET | ORAL | Status: DC | PRN
  Administered 2012-01-20: 650 mg via ORAL
  Filled 2012-01-18: qty 2

## 2012-01-18 MED ORDER — SENNOSIDES-DOCUSATE SODIUM 8.6-50 MG PO TABS
2.0000 | ORAL_TABLET | Freq: Every day | ORAL | Status: DC
  Administered 2012-01-18 – 2012-02-06 (×12): 2 via ORAL
  Filled 2012-01-18: qty 1
  Filled 2012-01-18 (×2): qty 2
  Filled 2012-01-18 (×2): qty 1
  Filled 2012-01-18 (×6): qty 2
  Filled 2012-01-18: qty 1
  Filled 2012-01-18 (×3): qty 2
  Filled 2012-01-18: qty 1
  Filled 2012-01-18: qty 2

## 2012-01-18 MED ORDER — BIOTENE DRY MOUTH MT LIQD
15.0000 mL | Freq: Two times a day (BID) | OROMUCOSAL | Status: DC
  Administered 2012-01-18 – 2012-02-08 (×40): 15 mL via OROMUCOSAL

## 2012-01-18 MED ORDER — BISACODYL 10 MG RE SUPP: 10.0000 mg | Freq: Every day | RECTAL | Status: DC | PRN

## 2012-01-18 NOTE — H&P (Signed)
Physical Medicine and Rehabilitation Admission H&P    Chief Complaint  Patient presents with  . Fall with right hip fracture ,   : HPI: Richard Kent is a 78 y.o. male with history of CVA with residual R-HP and aphasia (CIR to SNF 7/11), B-CAS, admitted on 01/13/12 due to Ground level fall with subsequent right hip pain. Xrays done revealing displaced right subcapital femoral neck fracture and surgical intervention recommended by Dr. Carola Frost. Patient noted to be hypoxic at admission and cardiology consulted for input. CTA chest done revealing BLL PNA and 2D echo done revealing EF 60-65% with diastolic dysfunction. Started on IV antibiotics for treatment. Cleared for surgery by Dr. Arbie Cookey as family with concerns regarding Peri/post-operative CVA. On 01/15/12, patient underwent Right hemiarthroplasty by Dr. Carola Frost. Post op WBAT with posterior hip precautions. He has had problems with confusion past surgery ?narcotic related. BP medications held due to hypotensive episode.  PT evaluation done yesterday and patient noted to have pain as well as increased extensor tone RLE limiting mobility.  Patient states that he is comfortable laying in bed but has pain when he tries to get up line at home his wife supervises bathing and dressing but he states that he was able to do most of tasks himself. He ambulated with a cane prior to his fall he did not drive since his stroke in 2011  Review of Systems  Eyes: Negative for blurred vision and double vision.  Respiratory: Negative for shortness of breath and wheezing.   Cardiovascular: Negative for chest pain and palpitations.  Gastrointestinal: Positive for constipation. Negative for heartburn.  Genitourinary: Negative for urgency and frequency.  Musculoskeletal: Positive for joint pain.  Neurological: Positive for focal weakness. Negative for headaches.   Past Medical History  Diagnosis Date  . Hypertension   . CVA (cerebral infarction) 2011  . Diverticulosis    . Smoker   . Carotid stenosis   . Kidney stone   . Stroke 11/17/09   Past Surgical History  Procedure Date  . Hernia repair     RIGHT  . Appendectomy   . Hip arthroplasty 01/15/2012    Procedure: ARTHROPLASTY BIPOLAR HIP;  Surgeon: Budd Palmer, MD;  Location: [facility] OR;  Service: Orthopedics;  Laterality: Right;   Family History  Problem Relation Age of Onset  . Cancer Mother   . Stroke Mother   . Heart disease Father    Social History: Married. Independent with hemi-walker and gait belt for mobility. Per reports that he quit smoking about 40 years ago. His smoking use included Cigarettes. He has a 40 pack-year smoking history. He has never used smokeless tobacco. He reports that he does not drink alcohol or use illicit drugs. Daughter lives with them and assists prn.   Allergies: No Known Allergies  Scheduled Meds:    . antiseptic oral rinse  15 mL Mouth Rinse BID  . aspirin  81 mg Oral Daily  . azithromycin  500 mg Oral Daily  . baclofen  10 mg Oral TID  . clopidogrel  75 mg Oral Q breakfast  . enoxaparin  40 mg Subcutaneous Q24H  . moxifloxacin  400 mg Oral Q2000  . polyethylene glycol  17 g Oral Daily  . senna-docusate  2 tablet Oral QHS  . simvastatin  20 mg Oral QPM  . sodium phosphate  1 enema Rectal Once  . tiZANidine  2 mg Oral BID    Medications Prior to Admission  Medication Sig Dispense Refill  .  acetaminophen (TYLENOL) 325 MG tablet Take 1-2 tablets (325-650 mg total) by mouth every 6 (six) hours as needed (or Fever >/= 101).  90 tablet  0  . albuterol (PROVENTIL) (5 MG/ML) 0.5% nebulizer solution Take 0.5 mLs (2.5 mg total) by nebulization every 4 (four) hours as needed for wheezing or shortness of breath.  20 mL  0  . Ascorbic Acid (VITAMIN C PO) Take 1 tablet by mouth every morning.       Marland Kitchen aspirin 81 MG tablet Take 81 mg by mouth every morning.       . baclofen (LIORESAL) 10 MG tablet Take 10 mg by mouth 3 (three) times daily.      . clopidogrel  (PLAVIX) 75 MG tablet Take 75 mg by mouth every morning.      Marland Kitchen HYDROcodone-acetaminophen (NORCO/VICODIN) 5-325 MG per tablet Take 1-2 tablets by mouth every 6 (six) hours as needed.  60 tablet  1  . Multiple Vitamin (MULTIVITAMIN WITH MINERALS) TABS Take 1 tablet by mouth every morning.      Bernadette Hoit Sodium (SENOKOT S PO) Take 2 tablets by mouth at bedtime.       . simvastatin (ZOCOR) 20 MG tablet Take 20 mg by mouth every evening.      Marland Kitchen tiZANidine (ZANAFLEX) 2 MG tablet Take 2 mg by mouth 2 (two) times daily.        Home:     Functional History:    Functional Status:  Mobility:          ADL:    Cognition: Cognition Orientation Level: Oriented X4     Blood pressure 153/79, pulse 73, temperature 98.6 F (37 C), temperature source Oral, resp. rate 18, weight 87.3 kg (192 lb 7.4 oz), SpO2 94.00%. Physical Exam  Nursing note and vitals reviewed. Constitutional: He is oriented to person, place, and time. He appears well-developed.       Thin male- O2 canula in place  HENT:  Head: Normocephalic and atraumatic.  Neck: Normal range of motion.  Cardiovascular: Normal rate and regular rhythm.   Pulmonary/Chest: Effort normal.       rhonchi upper airway.  Abdominal: Soft. Bowel sounds are normal.  Musculoskeletal:       Right hip incision with staples intact. Moderate edema right hip.  Neurological: He is alert and oriented to person, place, and time.       Follows commands without difficulty.  Able to state date.  Seem clearer today. Right hemiparesis-LE>UE.  RLE with tone--Heel/foot>knee.  right upper extremity 2 minus at the deltoid, biceps, triceps, grip Right lower extremity 1/5 in the hip flexor knee extensor and foot inverter 0 at the ankle dorsiflexor toe flexors and extensors. Tone is increased in the right biceps and increased in the right plantar flexors and inverters Sensation is intact to light touch on the right and left sides  CBC     Status:  Abnormal   Collection Time   01/18/12  5:20 AM      Component Value Range Comment   WBC 9.1  4.0 - 10.5 K/uL    RBC 3.48 (*) 4.22 - 5.81 MIL/uL    Hemoglobin 11.4 (*) 13.0 - 17.0 g/dL    HCT 16.1 (*) 09.6 - 52.0 %    MCV 92.8  78.0 - 100.0 fL    MCH 32.8  26.0 - 34.0 pg    MCHC 35.3  30.0 - 36.0 g/dL    RDW 04.5  40.9 - 81.1 %  Platelets 225  150 - 400 K/uL    No results found.  Post Admission Physician Evaluation: 1. Functional deficits secondary  to right subcapital femoral neck fracture status post THA. 2. Patient is admitted to receive collaborative, interdisciplinary care between the physiatrist, rehab nursing staff, and therapy team. 3. Patient's level of medical complexity and substantial therapy needs in context of that medical necessity cannot be provided at a lesser intensity of care such as a SNF. 4. Patient has experienced substantial functional loss from his/her baseline which was documented above under the "Functional History" and "Functional Status" headings.  Judging by the patient's diagnosis, physical exam, and functional history, the patient has potential for functional progress which will result in measurable gains while on inpatient rehab.  These gains will be of substantial and practical use upon discharge  in facilitating mobility and self-care at the household level. 5. Physiatrist will provide 24 hour management of medical needs as well as oversight of the therapy plan/treatment and provide guidance as appropriate regarding the interaction of the two. 6. 24 hour rehab nursing will assist with bladder management, bowel management, safety, skin/wound care, disease management, medication administration, pain management and patient education  and help integrate therapy concepts, techniques,education, etc. 7. PT will assess and treat for:  Pre-gait training gait training safety endurance equipment.  Goals are: min assist mobility mainly at wheelchair level maybe ambulating  with therapy but not in home environment by dc. 8. OT will assess and treat for: ADLs, cognitive perceptual skills, neuromuscular reeducation, spasticity reduction, splinting,  Endurance, safety.   Goals are: min assist ADLs. 9. SLP will assess and treat for: cognitive evaluation.  Goals are: assess current cognition versus baseline. 10. Case Management and Social Worker will assess and treat for psychological issues and discharge planning. 11. Team conference will be held weekly to assess progress toward goals and to determine barriers to discharge. 12. Patient will receive at least 3 hours of therapy per day at least 5 days per week. 13. ELOS: 2 weeks      Prognosis:  good   Medical Problem List and Plan: 1. DVT Prophylaxis/Anticoagulation: Pharmaceutical: Lovenox.   2. Pain Management: prn hydrocodone effective 3. Mood:  Appropriate without signs of distress. Will monitor for now. 4. Neuropsych: This patient is capable of making decisions on his/her own behalf. 5. HTN:  Check bid.  Off Norvasc and lisinopril currently.  6. CVA with left hemiparesis: old right frontal and left basal ganglia infarcts in as has worsening of spasticity. May need Zanaflex increased 7.  ABLA: will add iron supplement.  8.  BLL PNA: Has been on IV rocephin and Zithromax  since 8/25--change to po Avelox starting in am.  01/18/2012, 5:52 PM

## 2012-01-18 NOTE — Progress Notes (Signed)
PT Cancellation Note  Treatment cancelled today due to plans are for pt to d/c to CIR today.  Verdell Face, PTA [phone] 01/18/2012

## 2012-01-18 NOTE — Progress Notes (Signed)
Patient received at 1600 alert and oriented x4  o2 sat  At 98% on room air . Oriented patient to room and call bell system . Patient verbalized understanding of rehab process . allevyn noted to sacrum and right hip . Continue with plan of  Care .                                                           Cleotilde Neer

## 2012-01-18 NOTE — Plan of Care (Addendum)
Overall Plan of Care [facility]) Patient Details Name: Richard Kent MRN: [id number] DOB: 05-18-1929  Diagnosis:  R Hip fx rehab  Primary Diagnosis:     femoral neck fx Co-morbidities: R spastic hemi from prior CVA  Functional Problem List  Patient demonstrates impairments in the following areas: Balance, Edema, Endurance, Motor, Pain, Sensory  and Skin Integrity bowel endurance   Basic ADL's: grooming, bathing, dressing, toileting and transfers Advanced ADL's: simple meal preparation  Transfers:  bed mobility, bed to chair, toilet, tub/shower, car and furniture Locomotion:  ambulation and wheelchair mobility  Additional Impairments:  Functional use of upper extremity  Anticipated Outcomes Item Anticipated Outcome  Eating/Swallowing  Mod independent  Basic self-care  Min assist  Tolieting  Min assist   Bowel/Bladder  Min assist  Transfers  Supervision, min A car  Locomotion  Mod I w/c mobility, controlled environment gait mod A  Communication    Cognition    Pain  Min assist  Safety/Judgment  Min assist  Other     Therapy Plan: PT Frequency: 1-2 X/day, 60-90 minutes OT Frequency: 1-2 X/day, 60-90 minutes     Team Interventions: Item RN PT OT SLP SW TR Other  Self Care/Advanced ADL Retraining  x x      Neuromuscular Re-Education  x x      Therapeutic Activities  x x   x   UE/LE Strength Training/ROM  x x   x   UE/LE Coordination Activities  x x   x   Visual/Perceptual Remediation/Compensation         DME/Adaptive Equipment Instruction  x x   x   Therapeutic Exercise  x x   x   Balance/Vestibular Training  x x   x   Patient/Family Education x x x   x   Cognitive Remediation/Compensation         Functional Mobility Training  x x   x   Ambulation/Gait Training  x       Stair Training  x       Wheelchair Propulsion/Positioning  x x   x   Functional Tourist information centre manager Reintegration  x x   x   Dysphagia/Aspiration Sales executive         Bladder Management x        Bowel Management         Disease Management/Prevention x x       Pain Management x x x      Medication Management x        Skin Care/Wound Management x x       Splinting/Orthotics  x x      Discharge Planning  x x  x x   Psychosocial Support  x   x x                      Team Discharge Planning: Destination:  Home Projected Follow-up:  PT, OT and Home Health Projected Equipment Needs:  None PT TBD. Patient/family involved in discharge planning:  Yes  MD ELOS: 10d Medical Rehab Prognosis:  Good Assessment: 78 yo male with chronic R hemiparesis fell sustained R hip fx now requiring CIR level PT/OT, 24/7 rehab RN and MD

## 2012-01-18 NOTE — Progress Notes (Signed)
Orthopaedic Trauma Service (OTS)  Subjective: 3 Days Post-Op Procedure(s) (LRB): ARTHROPLASTY BIPOLAR HIP (Right)  Doing well No acute ortho issues Pain controlled  Objective: Current Vitals Blood pressure 142/62, pulse 76, temperature 98.5 F (36.9 C), temperature source Oral, resp. rate 18, height 6' (1.829 m), weight 86.41 kg (190 lb 8 oz), SpO2 94.00%. Vital signs in last 24 hours: Temp:  [98.4 F (36.9 C)-99.3 F (37.4 C)] 98.5 F (36.9 C) (08/29 1026) Pulse Rate:  [75-83] 76  (08/29 1026) Resp:  [18-19] 18  (08/29 1026) BP: (125-149)/(56-65) 142/62 mmHg (08/29 1026) SpO2:  [90 %-96 %] 94 % (08/29 1026) Weight:  [86.41 kg (190 lb 8 oz)] 86.41 kg (190 lb 8 oz) (08/29 0612)  Intake/Output from previous day: 08/28 0701 - 08/29 0700 In: 600 [P.O.:600] Out: 485 [Urine:485]  LABS  Basename 01/18/12 0520 01/17/12 0748 01/16/12 0613  HGB 11.4* 12.3* 12.0*    Basename 01/18/12 0520 01/17/12 0748  WBC 9.1 9.4  RBC 3.48* 3.73*  HCT 32.3* 35.0*  PLT 225 196    Basename 01/17/12 0748 01/16/12 0613  NA 138 137  K 3.8 3.9  CL 104 103  CO2 26 26  BUN 15 15  CREATININE 0.78 0.75  GLUCOSE 100* 119*  CALCIUM 8.5 8.1*   No results found for this basename: LABPT:2,INR:2 in the last 72 hours   Physical Exam  Gen:NAD, resting comfortably QMV:HQION Lower Extremity   No change in exam  Stable dressing   Imaging No results found.  Assessment/Plan: 3 Days Post-Op Procedure(s) (LRB): ARTHROPLASTY BIPOLAR HIP (Right)  78 y/o male s/p fall   1. Displaced R femoral neck fracture POD #2, R hip hemiarthroplasty   WBAT   Posterior hip precautions   Pillow or abduction pillow between legs when in chairs (wheelchair or bedside chair) to prevent hips adducting and internally rotating. Ok to d/c pillow when in bed   Ice prn   Dressing change prn   Therapies daily  2. Pain   Low dose narcotics, tylenol  3. DVT/PE prophylaxis   Cover with lovenox while inpatient    Upon d/c ok with resuming plavix alone, concern with over-anticoagulation with lovenox AND plavix  4. Continue per IM  5. Dispo   Stable for transfer to CIR today from ortho standpoint   Mearl Latin, PA-C [facility] [phone] (P) 01/18/2012, 11:41 AM

## 2012-01-18 NOTE — Progress Notes (Signed)
Can admit to CIR today.  If agree, please write d/c to CIR order.  Thanks. [phone]

## 2012-01-19 ENCOUNTER — Inpatient Hospital Stay (HOSPITAL_COMMUNITY): Payer: Medicare Other

## 2012-01-19 ENCOUNTER — Inpatient Hospital Stay (HOSPITAL_COMMUNITY): Payer: Medicare Other | Admitting: Physical Therapy

## 2012-01-19 ENCOUNTER — Inpatient Hospital Stay (HOSPITAL_COMMUNITY): Payer: Medicare Other | Admitting: *Deleted

## 2012-01-19 DIAGNOSIS — Z5189 Encounter for other specified aftercare: Secondary | ICD-10-CM

## 2012-01-19 DIAGNOSIS — S72009A Fracture of unspecified part of neck of unspecified femur, initial encounter for closed fracture: Secondary | ICD-10-CM

## 2012-01-19 DIAGNOSIS — G8110 Spastic hemiplegia affecting unspecified side: Secondary | ICD-10-CM

## 2012-01-19 LAB — COMPREHENSIVE METABOLIC PANEL
ALT: 88 U/L — ABNORMAL HIGH (ref 0–53)
AST: 65 U/L — ABNORMAL HIGH (ref 0–37)
Albumin: 2.6 g/dL — ABNORMAL LOW (ref 3.5–5.2)
Alkaline Phosphatase: 89 U/L (ref 39–117)
Calcium: 8.5 mg/dL (ref 8.4–10.5)
Glucose, Bld: 112 mg/dL — ABNORMAL HIGH (ref 70–99)
Potassium: 3.8 mEq/L (ref 3.5–5.1)
Sodium: 139 mEq/L (ref 135–145)
Total Protein: 6.2 g/dL (ref 6.0–8.3)

## 2012-01-19 LAB — CBC WITH DIFFERENTIAL/PLATELET
Basophils Absolute: 0.1 10*3/uL (ref 0.0–0.1)
Eosinophils Absolute: 0.1 10*3/uL (ref 0.0–0.7)
Eosinophils Relative: 1 % (ref 0–5)
Lymphs Abs: 1.1 10*3/uL (ref 0.7–4.0)
MCH: 32.7 pg (ref 26.0–34.0)
Neutrophils Relative %: 79 % — ABNORMAL HIGH (ref 43–77)
Platelets: 278 10*3/uL (ref 150–400)
RBC: 3.3 MIL/uL — ABNORMAL LOW (ref 4.22–5.81)
RDW: 13.9 % (ref 11.5–15.5)
WBC: 11.1 10*3/uL — ABNORMAL HIGH (ref 4.0–10.5)

## 2012-01-19 NOTE — Progress Notes (Signed)
Patient information reviewed and entered into [facility]PRO system by Tora Duck, RN, CRRN, PPS Coordinator.  Information including medical coding and functional independence measure will be reviewed and updated through discharge.     Per nursing, Oletha Cruel, RN,  patient was given "Data Collection Information Summary for Patients in Inpatient Rehabilitation Facilities with attached "Privacy Act Statement-Health Care Records" upon admission.

## 2012-01-19 NOTE — Evaluation (Signed)
Occupational Therapy Assessment and Plan  Patient Details  Name: Richard Kent MRN: [id number] Date of Birth: 1929-05-11  OT Diagnosis: abnormal posture Rehab Potential: Rehab Potential: Good ELOS: 2-3 weeks   Today's Date: 01/19/2012 Time:  - 0800-0930 (90 min)    Problem List:  Patient Active Problem List  Diagnosis  . DYSLIPIDEMIA  . RBBB  . CVA WITH LEFT HEMIPARESIS  . Fracture of femoral neck, right  . Hypertension  . Carotid artery calcification  . Hypoxia  . RBBB  . CAP (community acquired pneumonia)  . Carotid stenosis, bilateral    Past Medical History:  Past Medical History  Diagnosis Date  . Hypertension   . CVA (cerebral infarction) 2011  . Diverticulosis   . Smoker   . Carotid stenosis   . Kidney stone   . Stroke 11/17/09   Past Surgical History:  Past Surgical History  Procedure Date  . Hernia repair     RIGHT  . Appendectomy   . Hip arthroplasty 01/15/2012    Procedure: ARTHROPLASTY BIPOLAR HIP;  Surgeon: Budd Palmer, MD;  Location: [facility] OR;  Service: Orthopedics;  Laterality: Right;    Assessment & Plan Clinical Impression: Patient is a 78 y.o. year old male :  HPI: Richard Kent is a 78 y.o. male with history of CVA with residual R-HP and aphasia (CIR to SNF 7/11), B-CAS, admitted on 01/13/12 due to Ground level fall with subsequent right hip pain. Xrays done revealing displaced right subcapital femoral neck fracture and surgical intervention recommended by Dr. Carola Frost. Patient noted to be hypoxic at admission and cardiology consulted for input. CTA chest done revealing BLL PNA and 2D echo done revealing EF 60-65% with diastolic dysfunction. Started on IV antibiotics for treatment. Cleared for surgery by Dr. Arbie Cookey as family with concerns regarding Peri/post-operative CVA. On 01/15/12, patient underwent Right hemiarthroplasty by Dr. Carola Frost. Post op WBAT with posterior hip precautions. He has had problems with confusion past surgery ?narcotic related. BP  medications held due to hypotensive episode. PT evaluation done yesterday and patient noted to have pain as well as increased extensor tone RLE limiting mobility.  Patient states that he is comfortable laying in bed but has pain when he tries to get up line at home his wife supervises bathing and dressing but he states that he was able to do most of tasks himself. He ambulated with a cane prior to his fall he did not drive since his stroke in 2011 .  Patient transferred to CIR on 01/18/2012 .    Patient currently requires total with basic self-care skills secondary to muscle weakness, muscle joint tightness and muscle paralysis and abnormal tone and unbalanced muscle activation.  Prior to hospitalization, patient could complete minimal assist bathing and dressing.  Patient will benefit from skilled intervention to increase independence with basic self-care skills prior to discharge home with care partner.  Anticipate patient will require intermittent supervision and follow up home health.  OT - End of Session Activity Tolerance: Tolerates 10 - 20 min activity with multiple rests Endurance Deficit: Yes OT Assessment Rehab Potential: Good Barriers to Discharge: None OT Plan OT Frequency: 1-2 X/day, 60-90 minutes Estimated Length of Stay: 2-3 weeks OT Treatment/Interventions: Balance/vestibular training;Discharge planning;DME/adaptive equipment instruction;Functional mobility training;Pain management;Neuromuscular re-education;Patient/family education;Self Care/advanced ADL retraining;Therapeutic Activities;Therapeutic Exercise;UE/LE Strength taining/ROM;UE/LE Coordination activities;Wheelchair propulsion/positioning  OT Evaluation Precautions/Restrictions  Precautions Precautions: Fall;Posterior Hip Precaution Comments: Pt is an old CVA affecting his right side (where now he has the hip replacement). Pt has  a muscle stimulator that he is using in a study with Dr. Creig Hines Dr. Pearlean Brownie and he  said it was fine for the pt to wear this with then new hip replacement since the impulse stays  locally (does not radiate). Restrictions RLE Weight Bearing: Weight bearing as tolerated General   Vital Signs Therapy Vitals Oxygen Therapy SpO2: 93 % O2 Device: Nasal cannula O Pain Pain Assessment Pain Assessment: 0-10 Pain Score:   7 Pain Type: Surgical pain Pain Location: Hip Pain Orientation: Right Pain Descriptors: Aching Pain Onset: On-going Patients Stated Pain Goal: 1 Pain Intervention(s): Medication (See eMAR) Home Living/Prior Functioning   ADL   Vision/Perception :  Baseline    Cognition:  Wfl  Sensation Sensation Light Touch: Impaired by gross assessment (RUE inpaired from CVA) Coordination Gross Motor Movements are Fluid and Coordinated: No Fine Motor Movements are Fluid and Coordinated: No Motor  Motor Motor: Hemiplegia Mobility  Bed Mobility Bed Mobility: Rolling Left;Supine to Sit;Sit to Sidelying Left;Scooting to [facility] [place] Rolling Left: 1: +1 Total assist Rolling Left Details: Verbal cues for technique;Tactile cues for placement;Tactile cues for weight shifting;Tactile cues for sequencing;Manual facilitation for placement Rolling Left Details (indicate cue type and reason):  (total assist to sequence and physical cues to perform) Left Sidelying to Sit: 1: +1 Total assist Supine to Sit: 1: +1 Total assist Supine to Sit Details: Verbal cues for sequencing;Verbal cues for technique;Manual facilitation for weight shifting;Manual facilitation for placement Sitting - Scoot to Edge of Bed: 1: +1 Total assist Sit to Supine: 1: +1 Total assist Transfers Sit to Stand: From bed;1: +2 Total assist Stand to Sit: 1: +1 Total assist  Trunk/Postural Assessment  Cervical Assessment Cervical Assessment: Within Functional Limits Thoracic Assessment Thoracic Assessment: Exceptions to [facility] Thoracic AROM Overall Thoracic AROM Comments: increased thoracic flexion Lumbar  Assessment Lumbar Assessment: Exceptions to [facility] Lumbar AROM Overall Lumbar AROM Comments: posterior pelvic tilt with increased weight on right Postural Control Postural Control: Deficits on evaluation (increased wB on Right)  Balance Balance Balance Assessed: Yes Static Sitting Balance Static Sitting - Balance Support: Feet supported;Left upper extremity supported Static Sitting - Level of Assistance: 5: Stand by assistance Dynamic Sitting Balance Dynamic Sitting - Balance Support: Feet supported;Left upper extremity supported;During functional activity Dynamic Sitting - Level of Assistance: 2: Max assist Extremity/Trunk Assessment RUE Assessment RUE Assessment: Exceptions to [facility] (Decreased ROM, plus maximum tone 3/4 on [place]) LUE Assessment LUE Assessment: Within Functional Limits  See FIM for current functional status Refer to Care Plan for Long Term Goals  Recommendations for other services: None  Discharge Criteria: Patient will be discharged from OT if patient refuses treatment 3 consecutive times without medical reason, if treatment goals not met, if there is a change in medical status, if patient makes no progress towards goals or if patient is discharged from hospital.  The above assessment, treatment plan, treatment alternatives and goals were discussed and mutually agreed upon: by patient  Humberto Seals 01/19/2012, 9:36 AM

## 2012-01-19 NOTE — Progress Notes (Signed)
Physical Therapy Session Note  Patient Details  Name: Richard Kent MRN: [id number] Date of Birth: Feb 20, 1929  Today's Date: 01/19/2012 Time: 1300-1346 Time Calculation (min): 46 min  Short Term Goals: Week 1:  PT Short Term Goal 1 (Week 1): Pt will be able to transfer squat pivot with mod A bed <-> w/c PT Short Term Goal 2 (Week 1): Pt will be able to do sit to stand with mod A PT Short Term Goal 3 (Week 1): Pt will be able to gait with lift equipment/total A+2 x 10' PT Short Term Goal 4 (Week 1): Pt will be able to propel w/c with supervision x 150' on unit  Skilled Therapeutic Interventions/Progress Updates:  Wheelchair mobility with Lt. UE and Lt. LE x 100' with supervision/min assist. Sliding board level transfer wheelchair <> mat, performed with max assist to mat then min/mod assist back to wheelchair. Pt requires repeated and varied approaches of explanation for motor planning/execution of sliding board transfer however once he starts he does very well with it.  Partial stands with mod/max assist to promote Bil. LE activation for utilization with transfers. Began practicing wheelchair parts management and pt direction of care, pt requires max verbal cues at this time.    Therapy Documentation Precautions:  Precautions Precautions: Fall;Posterior Hip - pt able to recall 2/3 posterior hip precautions . Precaution Comments: Pt is an old CVA affecting his right side (where now he has the hip replacement). Pt has a muscle stimulator that he is using in a study with Dr. Creig Hines Dr. Pearlean Brownie and he said it was fine for the pt to wear this with then new hip replacement since the impulse stays  locally (does not radiate). Restrictions RLE Weight Bearing: Weight bearing as tolerated Pain: Pain Assessment Pain Assessment: 0-10 Pain Score:   6 Pain Type: Surgical pain Pain Location: Hip Pain Orientation: Right Pain Descriptors: Aching Pain Onset: On-going Pain Intervention(s):  Medication (See eMAR)  See FIM for current functional status  Therapy/Group: Individual Therapy  [name] 01/19/2012, 5:43 PM

## 2012-01-19 NOTE — Progress Notes (Signed)
Inpatient Rehabilitation Center Individual Statement of Services  Patient Name:  Richard Kent  Date:  01/19/2012  Welcome to the Inpatient Rehabilitation Center.  Our goal is to provide you with an individualized program based on your diagnosis and situation, designed to meet your specific needs.  With this comprehensive rehabilitation program, you will be expected to participate in at least 3 hours of rehabilitation therapies Monday-Friday, with modified therapy programming on the weekends.  Your rehabilitation program will include the following services:  Physical Therapy (PT), Occupational Therapy (OT), 24 hour per day rehabilitation nursing, Therapeutic Recreaction (TR), Case Management ( Social Worker), Rehabilitation Medicine, Nutrition Services and Pharmacy Services  Weekly team conferences will be held on Tuesdays to discuss your progress.  Your  Social Worker will talk with you frequently to get your input and to update you on team discussions.  Team conferences with you and your family in attendance may also be held.  Expected length of stay: 2-3 weeks  Overall anticipated outcome: minimal assistance   Depending on your progress and recovery, your program may change.  Your  Social Worker will coordinate services and will keep you informed of any changes.  Your  Social Worker's name and contact numbers are listed  below.  The following services may also be recommended but are not provided by the Inpatient Rehabilitation Center:   Driving Evaluations  Home Health Rehabiltiation Services  Outpatient Rehabilitatation [facility]  Vocational Rehabilitation   Arrangements will be made to provide these services after discharge if needed.  Arrangements include referral to agencies that provide these services.  Your insurance has been verified to be:  Medicare and [place] Your primary doctor is:  Dr. Susann Givens  Pertinent information will be shared with your doctor and your insurance  company.  Social Worker:  [place], [place] [phone] or ([phone]  Information discussed with and copy given to patient by: Amada Jupiter, 01/19/2012, 4:25 PM

## 2012-01-19 NOTE — Evaluation (Signed)
Physical Therapy Assessment and Plan  Patient Details  Name: Richard Kent MRN: [id number] Date of Birth: 17-Oct-1928  PT Diagnosis: Difficulty walking, Edema, R Hemiplegia, Impaired sensation and Pain in joint Rehab Potential: Good ELOS: 2- 2.5 weeks   Today's Date: 01/19/2012 Time: 1030-1128 Time Calculation (min): 58 min  Problem List:  Patient Active Problem List  Diagnosis  . DYSLIPIDEMIA  . RBBB  . CVA WITH LEFT HEMIPARESIS  . Fracture of femoral neck, right  . Hypertension  . Carotid artery calcification  . Hypoxia  . RBBB  . CAP (community acquired pneumonia)  . Carotid stenosis, bilateral    Past Medical History:  Past Medical History  Diagnosis Date  . Hypertension   . CVA (cerebral infarction) 2011  . Diverticulosis   . Smoker   . Carotid stenosis   . Kidney stone   . Stroke 11/17/09   Past Surgical History:  Past Surgical History  Procedure Date  . Hernia repair     RIGHT  . Appendectomy   . Hip arthroplasty 01/15/2012    Procedure: ARTHROPLASTY BIPOLAR HIP;  Surgeon: Budd Palmer, MD;  Location: [facility] OR;  Service: Orthopedics;  Laterality: Right;    Assessment & Plan Clinical Impression: Patient is a 78 y.o. year old male with recent admission to the hospital with history of CVA with residual R-HP and aphasia (CIR to SNF 7/11), B-CAS, admitted on 01/13/12 due to Ground level fall with subsequent right hip pain. Xrays done revealing displaced right subcapital femoral neck fracture and surgical intervention recommended by Dr. Carola Frost. Patient noted to be hypoxic at admission and cardiology consulted for input. CTA chest done revealing BLL PNA and 2D echo done revealing EF 60-65% with diastolic dysfunction. Started on IV antibiotics for treatment. Cleared for surgery by Dr. Arbie Cookey as family with concerns regarding Peri/post-operative CVA. On 01/15/12, patient underwent Right hemiarthroplasty by Dr. Carola Frost. Post op WBAT with posterior hip precautions.   Patient  transferred to CIR on 01/18/2012 .   Patient currently requires max/total with mobility secondary to muscle weakness, muscle joint tightness and muscle paralysis, decreased cardiorespiratoy endurance, decreased coordination and decreased standing balance, decreased postural control, hemiplegia, decreased balance strategies and difficulty maintaining precautions.  Prior to hospitalization, patient was supervision/ min with mobility and lived with Spouse in a House with 2 stories.  Home access is 2 +3Stairs to enter.  Patient will benefit from skilled PT intervention to maximize safe functional mobility, minimize fall risk and decrease caregiver burden for planned discharge home with 24 hour supervision.  Anticipate patient will benefit from follow up HH at discharge.  PT - End of Session Endurance Deficit: Yes PT Assessment Rehab Potential: Good Barriers to Discharge: Inaccessible home environment (stair for home entry) PT Plan PT Frequency: 1-2 X/day, 60-90 minutes Estimated Length of Stay: 2- 2.5 weeks PT Treatment/Interventions: Ambulation/gait training;Balance/vestibular training;Community reintegration;Discharge planning;Disease management/prevention;DME/adaptive equipment instruction;Functional mobility training;Neuromuscular re-education;Pain management;Patient/family education;Psychosocial support;Skin care/wound management;Splinting/orthotics;Stair training;Therapeutic Activities;Therapeutic Exercise;UE/LE Coordination activities;UE/LE Strength taining/ROM;Wheelchair propulsion/positioning PT Recommendation Follow Up Recommendations: Home health PT;24 hour supervision/assistance  PT Evaluation Precautions/Restrictions Precautions Precautions: Fall;Posterior Hip Precaution Comments: Pt is an old CVA affecting his right side (where now he has the hip replacement). Pt has a muscle stimulator that he is using in a study with Dr. Creig Hines Dr. Pearlean Brownie and he said it was fine for the pt to  wear this with then new hip replacement since the impulse stays  locally (does not radiate). Restrictions RLE Weight Bearing: Weight bearing as tolerated Pain  C/o some discomfort in R hip, premedicated.  Home Living/Prior Functioning Home Living Lives With: Spouse Available Help at Discharge: Family;Available 24 hours/day Type of Home: House Home Access: Stairs to enter [employer] of Steps: 2 +3 Entrance Stairs-Rails: Can reach both Home Layout: Two level;Bed/bath upstairs;Able to live on main level with bedroom/bathroom Home Adaptive Equipment: Tub transfer bench;Quad cane;Wheelchair - manual Vision/Perception  Perception Perception: Within Functional Limits Praxis Praxis: Intact  Cognition Overall Cognitive Status: Appears within functional limits for tasks assessed Sensation Sensation Light Touch: Appears Intact (bilateral LEs) Coordination Gross Motor Movements are Fluid and Coordinated: No Fine Motor Movements are Fluid and Coordinated: No Motor  Motor Motor: Hemiplegia;Abnormal postural alignment and control ((old CVA))  Mobility Bed Mobility Bed Mobility: Rolling Left;Supine to Sit;Sit to Sidelying Left;Scooting to [facility] Rolling Left: 1: +1 Total assist Rolling Left Details: Verbal cues for technique;Tactile cues for placement;Tactile cues for weight shifting;Tactile cues for sequencing;Manual facilitation for placement Rolling Left Details (indicate cue type and reason):  (total assist to sequence and physical cues to perform) Left Sidelying to Sit: 1: +1 Total assist Supine to Sit: 1: +1 Total assist Supine to Sit Details: Verbal cues for sequencing;Verbal cues for technique;Manual facilitation for weight shifting;Manual facilitation for placement Sitting - Scoot to Edge of Bed: 1: +1 Total assist Sit to Supine: 1: +1 Total assist Transfers Sit to Stand: From bed;1: +2 Total assist Stand to Sit: 1: +1 Total assist    Trunk/Postural Assessment    Cervical Assessment Cervical Assessment: Within Functional Limits Thoracic Assessment Thoracic Assessment: Exceptions to [facility] Thoracic AROM Overall Thoracic AROM Comments: increased thoracic flexion Lumbar Assessment Lumbar Assessment: Exceptions to [facility] Lumbar AROM Overall Lumbar AROM Comments: posterior pelvic tilt with increased weight on right Postural Control Postural Control: Deficits on evaluation (decreased WB on Right, initially flexed posture but corrected with manual and verbal cueing)  Balance Balance Balance Assessed: Yes Static Sitting Balance Static Sitting - Balance Support: Feet supported;Left upper extremity supported Static Sitting - Level of Assistance: 5: Stand by assistance Dynamic Sitting Balance Dynamic Sitting - Balance Support: Feet supported;Left upper extremity supported;During functional activity Dynamic Sitting - Level of Assistance: 4: Min assist Static Standing Balance Static Standing - Level of Assistance: 1: +2 Total assist Extremity Assessment  RUE Assessment RUE Assessment: Exceptions to WFL (Decreased ROM, plus maximum tone 3/4 on [place]) LUE Assessment LUE Assessment: Within Functional Limits RLE Assessment RLE Assessment: Exceptions to [facility] RLE Strength RLE Overall Strength Comments: decreased ankle ROM/strength (has an AFO), 3-/5 knee and hip LLE Assessment LLE Assessment: Exceptions to [facility] LLE Strength LLE Overall Strength Comments: 3+/5 grossly  See FIM for current functional status Refer to Care Plan for Long Term Goals  Recommendations for other services: None  Discharge Criteria: Patient will be discharged from PT if patient refuses treatment 3 consecutive times without medical reason, if treatment goals not met, if there is a change in medical status, if patient makes no progress towards goals or if patient is discharged from hospital.  The above assessment, treatment plan, treatment alternatives and goals were discussed and  mutually agreed upon: by patient  Individual therapy treatment initiated with focus on basic transfers, hip precaution education, w/c propulsion, and static standing balance. Pt required total A+2 for squat pivot transfer but attempted transfers with slide board mat <-> w/c with mod A overall. Standing balance with 2 person A needed and attempted pt to move either foot for gait but unable, increased weightbearing on the L. Pt  does have an AFO for RLE in room and discussed using it in future with more standing and gait activities. W/c propulsion using LUE and LLE with min A in controlled environment for endurance and strengthening.  Karolee Stamps [facility] 01/19/2012, 11:58 AM

## 2012-01-19 NOTE — Progress Notes (Signed)
Occupational Therapy Note  Patient Details  Name: Byrl Latin MRN: [id number] Date of Birth: 01-19-1929 Today's Date: 01/19/2012  Time: 1345-1415 Pt c/o 7/10 pain in right hip; repositioned and RN notified  Individual therapy  Pt in w/c with wife and son present.  Pt recalled 2/3 hip precautions.  Discussed LTG's with pt and wife, prior bathing arrangements, and level of assistance required prior to this admission.  Attempted sit>stand from w/c.  Pt required max A for reciprocal scooting forward and backward in w/c.  Pt was tot A for lifting buttocks off seat approx 6".  Pt remained in w/c with call bell and phone within reach.  Wife and son present.  Lavone Neri [facility] 01/19/2012, 2:27 PM

## 2012-01-19 NOTE — Progress Notes (Signed)
Patient ID: Richard Kent, male   DOB: [birth year], 78 y.o.   MRN: [id number]  Subjective/Complaints:   Objective: Vital Signs: Blood pressure 165/85, pulse 76, temperature 98.2 F (36.8 C), temperature source Oral, resp. rate 17, weight 87.3 kg (192 lb 7.4 oz), SpO2 93.00%. Dg Chest 2 View  01/19/2012  *RADIOLOGY REPORT*  Clinical Data:   pneumonia  CHEST - 2 VIEW  Comparison: 01/12/2012  Findings: Some progression of left retrocardiac consolidation / atelectasis, with air bronchograms evident.  There are mild patchy airspace opacities medially at the right lung base as before. Suspect small left pleural effusion.  Heart size upper limits normal. Mild spondylitic changes in the lower thoracic spine.  IMPRESSION:  1.  Worsening left lower lobe consolidation with possible small effusion   Original Report Authenticated By: Osa Craver, M.D.    Results for orders placed during the hospital encounter of 01/18/12 (from the past 72 hour(s))  CBC WITH DIFFERENTIAL     Status: Abnormal   Collection Time   01/19/12  7:00 AM      Component Value Range Comment   WBC 11.1 (*) 4.0 - 10.5 K/uL    RBC 3.30 (*) 4.22 - 5.81 MIL/uL    Hemoglobin 10.8 (*) 13.0 - 17.0 g/dL    HCT 40.9 (*) 81.1 - 52.0 %    MCV 93.0  78.0 - 100.0 fL    MCH 32.7  26.0 - 34.0 pg    MCHC 35.2  30.0 - 36.0 g/dL    RDW 91.4  78.2 - 95.6 %    Platelets 278  150 - 400 K/uL    Neutrophils Relative 79 (*) 43 - 77 %    Neutro Abs 8.7 (*) 1.7 - 7.7 K/uL    Lymphocytes Relative 10 (*) 12 - 46 %    Lymphs Abs 1.1  0.7 - 4.0 K/uL    Monocytes Relative 11  3 - 12 %    Monocytes Absolute 1.2 (*) 0.1 - 1.0 K/uL    Eosinophils Relative 1  0 - 5 %    Eosinophils Absolute 0.1  0.0 - 0.7 K/uL    Basophils Relative 1  0 - 1 %    Basophils Absolute 0.1  0.0 - 0.1 K/uL      HEENT: normal Cardio: RRR Resp: CTA B/L GI: BS positive Extremity:  No Edema Skin:   Intact Neuro: Abnormal Motor 2-/5 in RUE and R hip/knee extesor synergy,  Abnormal FMC Ataxic/ dec FMC, Reflexes: 3+ and Tone:  Hypertonia Musc/Skel:  Normal   Assessment/Plan: 1. Functional deficits secondary to R Transcervical femur fracture with THA.  R spastic hemiparesis due to prior CVA which require 3+ hours per day of interdisciplinary therapy in a comprehensive inpatient rehab setting. Physiatrist is providing close team supervision and 24 hour management of active medical problems listed below. Physiatrist and rehab team continue to assess barriers to discharge/monitor patient progress toward functional and medical goals. FIM:                   Comprehension Comprehension Mode: Auditory  Expression Expression: 5-Expresses basic needs/ideas: With no assist  Social Interaction Social Interaction: 6-Interacts appropriately with others with medication or extra time (anti-anxiety, antidepressant).     Memory Memory: 5-Recognizes or recalls 90% of the time/requires cueing < 10% of the time  Medical Problem List and Plan:  1. DVT Prophylaxis/Anticoagulation: Pharmaceutical: Lovenox.  2. Pain Management: prn hydrocodone effective  3. Mood: Appropriate without signs  of distress. Will monitor for now.  4. Neuropsych: This patient is capable of making decisions on his/her own behalf.  5. HTN: Check bid. Off Norvasc and lisinopril currently.  6. CVA with left hemiparesis: old right frontal and left basal ganglia infarcts in as has worsening of spasticity. May need Zanaflex increased  7. ABLA: will add iron supplement.  8. BLL PNA: Has been on IV rocephin and Zithromax since 8/25--change to po Avelox .   LOS (Days) 1 A FACE TO FACE EVALUATION WAS PERFORMED  [name] E 01/19/2012, 8:25 AM

## 2012-01-20 ENCOUNTER — Inpatient Hospital Stay (HOSPITAL_COMMUNITY): Payer: Medicare Other | Admitting: Occupational Therapy

## 2012-01-20 LAB — CULTURE, BLOOD (ROUTINE X 2): Culture: NO GROWTH

## 2012-01-20 LAB — URINE CULTURE: Colony Count: NO GROWTH

## 2012-01-20 NOTE — Progress Notes (Signed)
Occupational Therapy Session Note  Patient Details  Name: Richard Kent MRN: [id number] Date of Birth: 1929-05-09  Today's Date: 01/20/2012 Time: 1115-1200 Time Calculation (min): 45 min  Skilled Therapeutic Interventions/Progress Updates: ADL in bed intermittently between attempts to use bedpain to complete BM.  Patient with decreased trunk strength and stability with right lateral leans.     Therapy Documentation Precautions:  Precautions Precautions: Fall;Posterior Hip Precaution Comments: Pt is an old CVA affecting his right side (where now he has the hip replacement). Pt has a muscle stimulator that he is using in a study with Dr. Creig Hines Dr. Pearlean Brownie and he said it was fine for the pt to wear this with then new hip replacement since the impulse stays  locally (does not radiate). Restrictions RLE Weight Bearing: Weight bearing as tolerated  Pain:denied   See FIM for current functional status  Therapy/Group: Individual Therapy  Bud Face [facility] 01/20/2012, 8:08 PM

## 2012-01-20 NOTE — Progress Notes (Signed)
Subjective/Complaints: No new complaints  Objective: Vital Signs: Blood pressure 167/64, pulse 84, temperature 98.5 F (36.9 C), temperature source Oral, resp. rate 18, weight 192 lb 7.4 oz (87.3 kg), SpO2 91.00%. Dg Chest 2 View  01/19/2012  *RADIOLOGY REPORT*  Clinical Data:   pneumonia  CHEST - 2 VIEW  Comparison: 01/12/2012  Findings: Some progression of left retrocardiac consolidation / atelectasis, with air bronchograms evident.  There are mild patchy airspace opacities medially at the right lung base as before. Suspect small left pleural effusion.  Heart size upper limits normal. Mild spondylitic changes in the lower thoracic spine.  IMPRESSION:  1.  Worsening left lower lobe consolidation with possible small effusion   Original Report Authenticated By: Osa Craver, M.D.    Results for orders placed during the hospital encounter of 01/18/12 (from the past 72 hour(s))  URINE CULTURE     Status: Normal   Collection Time   01/19/12  4:00 AM      Component Value Range Comment   Specimen Description URINE, CLEAN CATCH      Special Requests NONE      Culture  Setup Time 01/19/2012 04:12      Colony Count NO GROWTH      Culture NO GROWTH      Report Status 01/20/2012 FINAL     CBC WITH DIFFERENTIAL     Status: Abnormal   Collection Time   01/19/12  7:00 AM      Component Value Range Comment   WBC 11.1 (*) 4.0 - 10.5 K/uL    RBC 3.30 (*) 4.22 - 5.81 MIL/uL    Hemoglobin 10.8 (*) 13.0 - 17.0 g/dL    HCT 32.4 (*) 40.1 - 52.0 %    MCV 93.0  78.0 - 100.0 fL    MCH 32.7  26.0 - 34.0 pg    MCHC 35.2  30.0 - 36.0 g/dL    RDW 02.7  25.3 - 66.4 %    Platelets 278  150 - 400 K/uL    Neutrophils Relative 79 (*) 43 - 77 %    Neutro Abs 8.7 (*) 1.7 - 7.7 K/uL    Lymphocytes Relative 10 (*) 12 - 46 %    Lymphs Abs 1.1  0.7 - 4.0 K/uL    Monocytes Relative 11  3 - 12 %    Monocytes Absolute 1.2 (*) 0.1 - 1.0 K/uL    Eosinophils Relative 1  0 - 5 %    Eosinophils Absolute 0.1  0.0 -  0.7 K/uL    Basophils Relative 1  0 - 1 %    Basophils Absolute 0.1  0.0 - 0.1 K/uL   COMPREHENSIVE METABOLIC PANEL     Status: Abnormal   Collection Time   01/19/12  7:00 AM      Component Value Range Comment   Sodium 139  135 - 145 mEq/L    Potassium 3.8  3.5 - 5.1 mEq/L    Chloride 103  96 - 112 mEq/L    CO2 27  19 - 32 mEq/L    Glucose, Bld 112 (*) 70 - 99 mg/dL    BUN 15  6 - 23 mg/dL    Creatinine, Ser 4.03  0.50 - 1.35 mg/dL    Calcium 8.5  8.4 - 47.4 mg/dL    Total Protein 6.2  6.0 - 8.3 g/dL    Albumin 2.6 (*) 3.5 - 5.2 g/dL    AST 65 (*) 0 - 37  U/L    ALT 88 (*) 0 - 53 U/L    Alkaline Phosphatase 89  39 - 117 U/L    Total Bilirubin 0.8  0.3 - 1.2 mg/dL    GFR calc non Af Amer 81 (*) >90 mL/min    GFR calc Af Amer >90  >90 mL/min      HEENT: normal Cardio: RRR Resp: CTA B/L GI: BS positive Extremity:  No Edema Skin:   Intact Neuro: Abnormal Motor 2-/5 in RUE and R hip/knee extesor synergy, Abnormal FMC Ataxic/ dec FMC, Reflexes: 3+ and Tone:  Hypertonia Musc/Skel:  Normal   Assessment/Plan: 1. Functional deficits secondary to R Transcervical femur fracture with THA.  R spastic hemiparesis due to prior CVA which require 3+ hours per day of interdisciplinary therapy in a comprehensive inpatient rehab setting. Physiatrist is providing close team supervision and 24 hour management of active medical problems listed below. Physiatrist and rehab team continue to assess barriers to discharge/monitor patient progress toward functional and medical goals. FIM: FIM - Bathing Bathing Steps Patient Completed: Chest;Right Arm;Abdomen;Front perineal area;Right upper leg;Left upper leg Bathing: 2: Max-Patient completes 3-4 29f 10 parts or 25-49%  FIM - Upper Body Dressing/Undressing Upper body dressing/undressing steps patient completed: Thread/unthread left sleeve of pullover shirt/dress;Put head through opening of pull over shirt/dress Upper body dressing/undressing: 3:  Mod-Patient completed 50-74% of tasks FIM - Lower Body Dressing/Undressing Lower body dressing/undressing: 1: Two helpers  FIM - Toileting Toileting: 1: Two helpers  FIM - Diplomatic Services operational officer Devices: Psychiatrist Transfers: 1-Two helpers  FIM - Architectural technologist Transfer: 2: Chair or W/C > Bed: Max A (lift and lower assist);3: Bed > Chair or W/C: Mod A (lift or lower assist)  FIM - Locomotion: Wheelchair Distance: 100' Locomotion: Wheelchair: 2: Travels 50 - 149 ft with minimal assistance (Pt.>75%) FIM - Locomotion: Ambulation Locomotion: Ambulation: 0: Activity did not occur  Comprehension Comprehension Mode: Auditory Comprehension: 5-Follows basic conversation/direction: With extra time/assistive device  Expression Expression Mode: Verbal Expression: 5-Expresses basic needs/ideas: With no assist  Social Interaction Social Interaction: 6-Interacts appropriately with others with medication or extra time (anti-anxiety, antidepressant).  Problem Solving Problem Solving: 4-Solves basic 75 - 89% of the time/requires cueing 10 - 24% of the time  Memory Memory: 5-Recognizes or recalls 90% of the time/requires cueing < 10% of the time  Medical Problem List and Plan:  1. DVT Prophylaxis/Anticoagulation: Pharmaceutical: Lovenox.  2. Pain Management: prn hydrocodone effective  3. Mood: Appropriate without signs of distress. Will monitor for now.  4. Neuropsych: This patient is capable of making decisions on his/her own behalf.  5. HTN: Check bid. Off Norvasc and lisinopril currently.  6. CVA with left hemiparesis: old right frontal and left basal ganglia infarcts in as has worsening of spasticity. May need Zanaflex increased  7. ABLA: will add iron supplement.  8. BLL PNA: Has been on IV rocephin and Zithromax since 8/25--change to po Avelox .  9. Atelectases - using  incentive spirometer. LOS (Days) 2 A FACE TO FACE EVALUATION WAS PERFORMED  Alex Plotnikov 01/20/2012, 9:25 AM

## 2012-01-21 ENCOUNTER — Inpatient Hospital Stay (HOSPITAL_COMMUNITY): Payer: Medicare Other | Admitting: Occupational Therapy

## 2012-01-21 NOTE — Progress Notes (Signed)
Occupational Therapy Session Note  Patient Details  Name: Richard Kent MRN: [id number] Date of Birth: August 28, 1928  Today's Date: 01/21/2012 Time: 4098-1191 and 1500-1540 Time Calculation (min): 60 min+40=100 total minutes  Skilled Therapeutic Interventions/Progress Updates: AM:  ADL in bed with emphasis on safe lateral rolls and desensitizing R hip for periarea cleansing and integrating right hand into self care.  Patient required verbal and/or tactile cues and hand over hand assist to use right UE.  Used SARA per nursing tech suggestion rather than sliding board today.     PM session:  Patient able to boost upward on bed, sitting EOB with Max A.  R UE neuro reeducation and bed mobility   Therapy Documentation Precautions:  Precautions Precautions: Fall;Posterior Hip Precaution Comments: Pt is an old CVA affecting his right side (where now he has the hip replacement). Pt has a muscle stimulator that he is using in a study with Dr. Creig Hines Dr. Pearlean Brownie and he said it was fine for the pt to wear this with then new hip replacement since the impulse stays  locally (does not radiate). Restrictions RLE Weight Bearing: Weight bearing as tolerated  Pain:8/10 constant in surgical hip  See FIM for current functional status  Therapy/Group: Individual Therapy  Bud Face [facility] 01/21/2012, 5:02 PM

## 2012-01-21 NOTE — Progress Notes (Signed)
Patient ID: Richard Kent, male   DOB: 10-15-1928, 78 y.o.   MRN: [id number]  Subjective/Complaints: No new complaints Doing well  Objective: Vital Signs: Blood pressure 120/54, pulse 69, temperature 97.8 F (36.6 C), temperature source Oral, resp. rate 18, weight 192 lb 7.4 oz (87.3 kg), SpO2 93.00%. No results found. Results for orders placed during the hospital encounter of 01/18/12 (from the past 72 hour(s))  URINE CULTURE     Status: Normal   Collection Time   01/19/12  4:00 AM      Component Value Range Comment   Specimen Description URINE, CLEAN CATCH      Special Requests NONE      Culture  Setup Time 01/19/2012 04:12      Colony Count NO GROWTH      Culture NO GROWTH      Report Status 01/20/2012 FINAL     CBC WITH DIFFERENTIAL     Status: Abnormal   Collection Time   01/19/12  7:00 AM      Component Value Range Comment   WBC 11.1 (*) 4.0 - 10.5 K/uL    RBC 3.30 (*) 4.22 - 5.81 MIL/uL    Hemoglobin 10.8 (*) 13.0 - 17.0 g/dL    HCT 82.9 (*) 56.2 - 52.0 %    MCV 93.0  78.0 - 100.0 fL    MCH 32.7  26.0 - 34.0 pg    MCHC 35.2  30.0 - 36.0 g/dL    RDW 13.0  86.5 - 78.4 %    Platelets 278  150 - 400 K/uL    Neutrophils Relative 79 (*) 43 - 77 %    Neutro Abs 8.7 (*) 1.7 - 7.7 K/uL    Lymphocytes Relative 10 (*) 12 - 46 %    Lymphs Abs 1.1  0.7 - 4.0 K/uL    Monocytes Relative 11  3 - 12 %    Monocytes Absolute 1.2 (*) 0.1 - 1.0 K/uL    Eosinophils Relative 1  0 - 5 %    Eosinophils Absolute 0.1  0.0 - 0.7 K/uL    Basophils Relative 1  0 - 1 %    Basophils Absolute 0.1  0.0 - 0.1 K/uL   COMPREHENSIVE METABOLIC PANEL     Status: Abnormal   Collection Time   01/19/12  7:00 AM      Component Value Range Comment   Sodium 139  135 - 145 mEq/L    Potassium 3.8  3.5 - 5.1 mEq/L    Chloride 103  96 - 112 mEq/L    CO2 27  19 - 32 mEq/L    Glucose, Bld 112 (*) 70 - 99 mg/dL    BUN 15  6 - 23 mg/dL    Creatinine, Ser 6.96  0.50 - 1.35 mg/dL    Calcium 8.5  8.4 - 29.5 mg/dL      Total Protein 6.2  6.0 - 8.3 g/dL    Albumin 2.6 (*) 3.5 - 5.2 g/dL    AST 65 (*) 0 - 37 U/L    ALT 88 (*) 0 - 53 U/L    Alkaline Phosphatase 89  39 - 117 U/L    Total Bilirubin 0.8  0.3 - 1.2 mg/dL    GFR calc non Af Amer 81 (*) >90 mL/min    GFR calc Af Amer >90  >90 mL/min      HEENT: normal Cardio: RRR Resp: CTA B/L GI: BS positive Extremity:  No Edema Skin:  Intact Neuro: Abnormal Motor 2-/5 in RUE and R hip/knee extesor synergy, Abnormal FMC Ataxic/ dec FMC, Reflexes: 3+ and Tone:  Hypertonia Musc/Skel:  Normal   Assessment/Plan: 1. Functional deficits secondary to R Transcervical femur fracture with THA.  R spastic hemiparesis due to prior CVA which require 3+ hours per day of interdisciplinary therapy in a comprehensive inpatient rehab setting. Physiatrist is providing close team supervision and 24 hour management of active medical problems listed below. Physiatrist and rehab team continue to assess barriers to discharge/monitor patient progress toward functional and medical goals. FIM: FIM - Bathing Bathing Steps Patient Completed: Chest;Right Arm;Abdomen;Front perineal area;Right upper leg;Left upper leg Bathing: 2: Max-Patient completes 3-4 66f 10 parts or 25-49%  FIM - Upper Body Dressing/Undressing Upper body dressing/undressing steps patient completed: Thread/unthread left sleeve of pullover shirt/dress;Put head through opening of pull over shirt/dress Upper body dressing/undressing: 3: Mod-Patient completed 50-74% of tasks FIM - Lower Body Dressing/Undressing Lower body dressing/undressing: 1: Two helpers  FIM - Toileting Toileting: 1: Two helpers  FIM - Diplomatic Services operational officer Devices: Psychiatrist Transfers: 1-Two helpers  FIM - Architectural technologist Transfer: 2: Chair or W/C > Bed: Max A (lift and lower assist);3: Bed > Chair or W/C: Mod A (lift or lower  assist)  FIM - Locomotion: Wheelchair Distance: 100' Locomotion: Wheelchair: 2: Travels 50 - 149 ft with minimal assistance (Pt.>75%) FIM - Locomotion: Ambulation Locomotion: Ambulation: 0: Activity did not occur  Comprehension Comprehension Mode: Auditory Comprehension: 5-Follows basic conversation/direction: With extra time/assistive device  Expression Expression Mode: Verbal Expression: 5-Expresses basic needs/ideas: With no assist  Social Interaction Social Interaction: 6-Interacts appropriately with others with medication or extra time (anti-anxiety, antidepressant).  Problem Solving Problem Solving: 4-Solves basic 75 - 89% of the time/requires cueing 10 - 24% of the time  Memory Memory: 5-Recognizes or recalls 90% of the time/requires cueing < 10% of the time  Medical Problem List and Plan:  1. DVT Prophylaxis/Anticoagulation: Pharmaceutical: Lovenox.  2. Pain Management: prn hydrocodone effective  3. Mood: Appropriate without signs of distress. Will monitor for now.  4. Neuropsych: This patient is capable of making decisions on his/her own behalf.  5. HTN: Check bid. Off Norvasc and lisinopril currently.  6. CVA with left hemiparesis: old right frontal and left basal ganglia infarcts in as has worsening of spasticity. May need Zanaflex increased  7. ABLA: will add iron supplement.  8. BLL PNA: Has been on IV rocephin and Zithromax since 8/25--change to po Avelox .  9. Atelectases - using incentive spirometer. No cough LOS (Days) 3 A FACE TO FACE EVALUATION WAS PERFORMED  Alex Plotnikov 01/21/2012, 9:18 AM

## 2012-01-21 NOTE — Progress Notes (Signed)
Social Work  Social Work Assessment and Plan  Patient Details  Name: Richard Kent MRN: [id number] Date of Birth: 1929/01/09  Today's Date: 01/21/2012  Problem List:  Patient Active Problem List  Diagnosis  . DYSLIPIDEMIA  . RBBB  . CVA WITH LEFT HEMIPARESIS  . Fracture of femoral neck, right  . Hypertension  . Carotid artery calcification  . Hypoxia  . RBBB  . CAP (community acquired pneumonia)  . Carotid stenosis, bilateral   Past Medical History:  Past Medical History  Diagnosis Date  . Hypertension   . CVA (cerebral infarction) 2011  . Diverticulosis   . Smoker   . Carotid stenosis   . Kidney stone   . Stroke 11/17/09   Past Surgical History:  Past Surgical History  Procedure Date  . Hernia repair     RIGHT  . Appendectomy   . Hip arthroplasty 01/15/2012    Procedure: ARTHROPLASTY BIPOLAR HIP;  Surgeon: Budd Palmer, MD;  Location: [facility] OR;  Service: Orthopedics;  Laterality: Right;   Social History:  reports that he quit smoking about 40 years ago. His smoking use included Cigarettes. He has a 40 pack-year smoking history. He has never used smokeless tobacco. He reports that he does not drink alcohol or use illicit drugs.  Family / Support Systems Marital Status: Married How Long?: 42 years (pt's 2nd wife) Patient Roles: Spouse;Parent Spouse/Significant Other: wife, [name] @ [phone]  Children: daughter, [name] (only local child) @ (C) [phone]  - four other adult children living out of town Anticipated Caregiver: wife and daughter Ability/Limitations of Caregiver: min assist to supervision Caregiver Availability: 24/7 Family Dynamics: Pt describes very supportive family.  Daughter and son visiting at start of interview and providing pt alot of encouragement and support.  Wife also  very attentive.  Both pt and wife deny any family stressors/ tensions..  Social History Preferred language: English Religion:  Cultural Background:  NA Education: HS Read: Yes Write: Yes Employment Status: Retired Fish farm manager Issues: none Guardian/Conservator: none   Abuse/Neglect Physical Abuse: Denies Verbal Abuse: Denies Sexual Abuse: Denies Exploitation of patient/patient's resources: Denies Self-Neglect: Denies  Emotional Status Pt's affect, behavior adn adjustment status: Pt very pleasant gentleman, alert and oriented, with family at bedside.  Admits frustration with fall/ fx, however, motivated to complete CIR program a second time.  Denies any s/s of depression or anxiety and wife denies noting any for him as well.  Will monitor, however, formal screen does not appear indicated at this time. Recent Psychosocial Issues: Stroke in 2011 with CIR stay - denies any other life stressors. Pyschiatric History: None Substance Abuse History: None  Patient / Family Perceptions, Expectations & Goals Pt/Family understanding of illness & functional limitations: Pt and family with good understanding of injury and need for CIR therapies.  Familiar with CIR from 2011 stay, however, all state, "...but this is a little different..."   Premorbid pt/family roles/activities: Pt had resumed his prior independence since stroke, however, admits little community level activities.  Feels he is more sedentary since stroke.   Anticipated changes in roles/activities/participation: Little change anticipated on the long term.  Wife and daughter to assume caregiver duties intially until pt able to fully regain his independence. Pt/family expectations/goals: "I'd like to get to be able to do things for myself again."  [employer]: None Premorbid Home Care/DME Agencies: Other (Comment) ([facility] after stroke then OP Neuro Rehab) Transportation available at discharge: yes  Discharge Planning Living  Arrangements: Spouse/significant other Support Systems: Spouse/significant other;Children;Church/faith community Type of  Residence: Private residence Insurance Resources: Administrator (specify) Counselling psychologist) Financial Resources: Restaurant manager, fast food Screen Referred: No Living Expenses: Own Money Management: Patient;Spouse Do you have any problems obtaining your medications?: No Home Management: pt and wife Patient/Family Preliminary Plans: Pt plans to return home with his wife as primary caregiver and additional support from daughter. Social Work Anticipated Follow Up Needs: HH/OP Expected length of stay: [facility] 2 to 3 weeks  Clinical Impression Pleasant, oriented gentleman here after a fall and hip fx.  Familiar to CIR from a prior stroke in 2011.  Wife and family extremely supportive.  No s/s of any emotional distress beyond pt's frustration with the circumstances of fall.    Brett Darko 01/21/2012, 3:01 PM

## 2012-01-22 ENCOUNTER — Inpatient Hospital Stay (HOSPITAL_COMMUNITY): Payer: Medicare Other | Admitting: Physical Therapy

## 2012-01-22 ENCOUNTER — Inpatient Hospital Stay (HOSPITAL_COMMUNITY): Payer: Medicare Other

## 2012-01-22 DIAGNOSIS — Z5189 Encounter for other specified aftercare: Secondary | ICD-10-CM

## 2012-01-22 DIAGNOSIS — S72009A Fracture of unspecified part of neck of unspecified femur, initial encounter for closed fracture: Secondary | ICD-10-CM

## 2012-01-22 NOTE — Progress Notes (Signed)
Patient participated all therapy sessions. Patient with mild tenting skin turgor and tea color urine, encouraging patient to drink a lot of fluid today.   Patient refused miralax this morning, stating he had loose stools the day before.  Will continue with care of plan.

## 2012-01-22 NOTE — Progress Notes (Signed)
Physical Therapy Session Note  Patient Details  Name: Richard Kent MRN: [id number] Date of Birth: May 29, 1928  Today's Date: 01/22/2012 Time: 4098-1191 Time Calculation (min): 58 min  Short Term Goals: Week 1:  PT Short Term Goal 1 (Week 1): Pt will be able to transfer squat pivot with mod A bed <-> w/c PT Short Term Goal 2 (Week 1): Pt will be able to do sit to stand with mod A PT Short Term Goal 3 (Week 1): Pt will be able to gait with lift equipment/total A+2 x 10' PT Short Term Goal 4 (Week 1): Pt will be able to propel w/c with supervision x 150' on unit  Skilled Therapeutic Interventions/Progress Updates:  Pain: 5/10 pt desired to take pain medication post session, RN made aware.     Bed mobility supine to sit to Lt with pillow between legs, cues posterior hip precautions. Less assist needed than to the Rt (min assist) and this is how pt performs at home. Sliding board level transfer bed to wheelchair to the Rt. Verbal cues for sequencing and initiation, performed with min assist and increased time. Pt able to verbalize set up with moderate verbal cues. Sit <> stands x 3 reps with max assist, moderate assist for standing balance. Standing UE activities (checkers) to increase standing tolerance, decrease UE reliance, increase rt. LE weight bearing, and improve bil. hip extension.   Therapy Documentation Precautions:  Precautions Precautions: Fall;Posterior Hip Precaution Comments: Pt is an old CVA affecting his right side (where now he has the hip replacement). Pt has a muscle stimulator that he is using in a study with Dr. Creig Hines Dr. Pearlean Brownie and he said it was fine for the pt to wear this with then new hip replacement since the impulse stays  locally (does not radiate). Restrictions RLE Weight Bearing: Weight bearing as tolerated Locomotion : Wheelchair Mobility Distance: 120'   See FIM for current functional status  Therapy/Group: Individual Therapy  [name] 01/22/2012, 5:37 PM

## 2012-01-22 NOTE — Progress Notes (Signed)
Patient ID: Richard Kent, male   DOB: 1929/02/24, 78 y.o.   MRN: [id number]  Subjective/Complaints: BMs ok, R hip pain at incision  Objective: Vital Signs: Blood pressure 153/54, pulse 62, temperature 98.7 F (37.1 C), temperature source Oral, resp. rate 17, weight 87.3 kg (192 lb 7.4 oz), SpO2 92.00%. No results found. No results found for this or any previous visit (from the past 72 hour(s)).   HEENT: normal Cardio: RRR Resp: CTA B/L GI: BS positive Extremity:  No Edema Skin:   Intact Neuro: Abnormal Motor 2-/5 in RUE and R hip/knee extesor synergy, Abnormal FMC Ataxic/ dec FMC, Reflexes: 3+ and Tone:  Hypertonia Musc/Skel:  Normal   Assessment/Plan: 1. Functional deficits secondary to R Transcervical femur fracture with THA.  R spastic hemiparesis due to prior CVA which require 3+ hours per day of interdisciplinary therapy in a comprehensive inpatient rehab setting. Physiatrist is providing close team supervision and 24 hour management of active medical problems listed below. Physiatrist and rehab team continue to assess barriers to discharge/monitor patient progress toward functional and medical goals. FIM: FIM - Bathing Bathing Steps Patient Completed: Chest;Right Arm;Abdomen;Front perineal area;Right upper leg;Left upper leg (in bed with HOB elevated & lateral rolls for buttocks) Bathing: 2: Max-Patient completes 3-4 25f 10 parts or 25-49%  FIM - Upper Body Dressing/Undressing Upper body dressing/undressing steps patient completed: Thread/unthread left sleeve of pullover shirt/dress;Thread/unthread right sleeve of pullover shirt/dresss Upper body dressing/undressing: 3: Mod-Patient completed 50-74% of tasks FIM - Lower Body Dressing/Undressing Lower body dressing/undressing: 1: Total-Patient completed less than 25% of tasks  FIM - Toileting Toileting: 1: Two helpers  FIM - Diplomatic Services operational officer Devices: Psychiatrist Transfers: 1-Two  helpers  FIM - Banker Devices: Sliding board (used Air cabin crew for transfer today) Bed/Chair Transfer: 2: Supine > Sit: Max A (lifting assist/Pt. 25-49%);1: Mechanical lift (used Huntley Dec for transfer)  FIM - Locomotion: Wheelchair Distance: 100' Locomotion: Wheelchair: 2: Travels 50 - 149 ft with minimal assistance (Pt.>75%) FIM - Locomotion: Ambulation Locomotion: Ambulation: 0: Activity did not occur  Comprehension Comprehension Mode: Auditory Comprehension: 5-Follows basic conversation/direction: With extra time/assistive device  Expression Expression Mode: Verbal Expression: 4-Expresses basic 75 - 89% of the time/requires cueing 10 - 24% of the time. Needs helper to occlude trach/needs to repeat words.  Social Interaction Social Interaction: 6-Interacts appropriately with others with medication or extra time (anti-anxiety, antidepressant).  Problem Solving Problem Solving: 3-Solves basic 50 - 74% of the time/requires cueing 25 - 49% of the time  Memory Memory: 5-Recognizes or recalls 90% of the time/requires cueing < 10% of the time  Medical Problem List and Plan:  1. DVT Prophylaxis/Anticoagulation: Pharmaceutical: Lovenox.  2. Pain Management: prn hydrocodone effective  3. Mood: Appropriate without signs of distress. Will monitor for now.  4. Neuropsych: This patient is capable of making decisions on his/her own behalf.  5. HTN: Check bid. Off Norvasc and lisinopril currently.  6. CVA with left hemiparesis: old right frontal and left basal ganglia infarcts in as has worsening of spasticity. May need Zanaflex increased  7. ABLA: will add iron supplement.  8. BLL PNA: Has been on IV rocephin and Zithromax ,Avelox since 8/25 May D/C LOS (Days) 4 A FACE TO FACE EVALUATION WAS PERFORMED  KIRSTEINS,ANDREW E 01/22/2012, 9:55 AM

## 2012-01-22 NOTE — Progress Notes (Signed)
Occupational Therapy Session Note  Patient Details  Name: Richard Kent MRN: [id number] Date of Birth: 19-Feb-1929  Today's Date: 01/22/2012  Session 1 Time: 1100-1200 Time Calculation (min): 60 min  Short Term Goals: Week 1:  OT Short Term Goal 1 (Week 1): Pt. will be supervision with UB bathing OT Short Term Goal 2 (Week 1): Pt. will be stand for LB bathing with mod assist OT Short Term Goal 3 (Week 1): Pt. will donn LB clothes with mod assist OT Short Term Goal 4 (Week 1): Pt. wiil perform 1/3 toileting tasks OT Short Term Goal 5 (Week 1): Pt. will dress LB with mod assist  Skilled Therapeutic Interventions/Progress Updates:    Pt engaged in bathing and dressing tasks with sit<>stand from w/c at sink.  Pt was tot A + 2 for bathing buttocks and pulling up pants.  Pt required assistance with bathing LUE (pt stated that his wife assisted prior to this admission.) Pt required max A for sit to stand at sink but unable to use LUE for functional tasks while standing.  Focus on activity tolerance, sit<>stand, standing balance, and safety awareness.  Pt recalled 3/3 hip precautions.  Therapy Documentation Precautions:  Precautions Precautions: Fall;Posterior Hip Precaution Comments: Pt is an old CVA affecting his right side (where now he has the hip replacement). Pt has a muscle stimulator that he is using in a study with Dr. Creig Hines Dr. Pearlean Brownie and he said it was fine for the pt to wear this with then new hip replacement since the impulse stays  locally (does not radiate). Restrictions RLE Weight Bearing: Weight bearing as tolerated   Pain: Pain Assessment Pain Assessment: 0-10 Pain Score:   7 Pain Type: Surgical pain Pain Location: Hip Pain Orientation: Right Pain Descriptors: Aching Pain Frequency: Intermittent Pain Onset: With Activity Patients Stated Pain Goal: 2 Pain Intervention(s): RN made aware;Repositioned  See FIM for current functional status  Therapy/Group:  Individual Therapy  Session 2 Time: 1330-1400 Pt c/o 6/10 pain in right hip at surgical site; repositioned Individual Therapy Pt engaged in sit<>stand and standing activities using LUE to engage in functional tasks.  Pt required max A for sit to stand without surface to pull up on.  After standing patient required min A for maintaining balance.  Pt stood X 3 during session with rest breaks in between.  Pt returned to room and assisted to bed for rest before next scheduled therapy session.  Pt required tot A for squat pivot transfer to right.  Pt able to assist with repositioning in bed.  Richard Kent [facility] 01/22/2012, 12:02 PM

## 2012-01-22 NOTE — Progress Notes (Addendum)
Physical Therapy Session Note  Patient Details  Name: Richard Kent MRN: [id number] Date of Birth: 1928-06-19  Today's Date: 01/22/2012 Time: 4098-1191 Time Calculation (min): 45 min  Short Term Goals: Week 1:  PT Short Term Goal 1 (Week 1): Pt will be able to transfer squat pivot with mod A bed <-> w/c PT Short Term Goal 2 (Week 1): Pt will be able to do sit to stand with mod A PT Short Term Goal 3 (Week 1): Pt will be able to gait with lift equipment/total A+2 x 10' PT Short Term Goal 4 (Week 1): Pt will be able to propel w/c with supervision x 150' on unit  Skilled Therapeutic Interventions/Progress Updates:    Bed mobility supine to sit with max assist, pt able to negotiate Rt. LE with min assist however has difficult with uprighting trunk. Sliding board transfer bed to wheelchair, needed to practice sit <> stands inorder to successfully incorporate Lt. LE to assist with transfer. Sit <> stands in standing frame, partial squats and lateral/anterior/posterior weight shifting promoting standing tolerance and weight bearing through Rt. LE.   Therapy Documentation Precautions:  Precautions Precautions: Fall;Posterior Hip, pt still only able to recall 2/3 hip precautions. Reeducated on hip internal rotation Precaution Comments: Pt is an old CVA affecting his right side (where now he has the hip replacement). Pt has a muscle stimulator that he is using in a study with Dr. Creig Hines Dr. Pearlean Brownie and he said it was fine for the pt to wear this with then new hip replacement since the impulse stays  locally (does not radiate). Restrictions RLE Weight Bearing: Weight bearing as tolerated Pain: 5/10 Rt. Hip pain, premedicated  See FIM for current functional status  Therapy/Group: Individual Therapy  [name] 01/22/2012, 12:28 PM

## 2012-01-22 NOTE — Evaluation (Signed)
Recreational Therapy Assessment and Plan  Patient Details  Name: Richard Kent MRN: [id number] Date of Birth: 25-Jan-1929 Today's Date: 01/22/2012  Rehab Potential: Good ELOS: 2 weeks   Assessment Clinical Impression: Problem List:  Patient Active Problem List   Diagnosis   .  DYSLIPIDEMIA   .  RBBB   .  CVA WITH LEFT HEMIPARESIS   .  Fracture of femoral neck, right   .  Hypertension   .  Carotid artery calcification   .  Hypoxia   .  RBBB   .  CAP (community acquired pneumonia)   .  Carotid stenosis, bilateral    Past Medical History:  Past Medical History   Diagnosis  Date   .  Hypertension    .  CVA (cerebral infarction)  2011   .  Diverticulosis    .  Smoker    .  Carotid stenosis    .  Kidney stone    .  Stroke  11/17/09    Past Surgical History:  Past Surgical History   Procedure  Date   .  Hernia repair      RIGHT   .  Appendectomy    .  Hip arthroplasty  01/15/2012     Procedure: ARTHROPLASTY BIPOLAR HIP; Surgeon: Budd Palmer, MD; Location: [facility] OR; Service: Orthopedics; Laterality: Right;    Assessment & Plan  Clinical Impression: Patient is a 78 y.o. year old male with recent admission to the hospital with history of CVA with residual R-HP and aphasia (CIR to SNF 7/11), B-CAS, admitted on 01/13/12 due to Ground level fall with subsequent right hip pain. Xrays done revealing displaced right subcapital femoral neck fracture and surgical intervention recommended by Dr. Carola Frost. Patient noted to be hypoxic at admission and cardiology consulted for input. CTA chest done revealing BLL PNA and 2D echo done revealing EF 60-65% with diastolic dysfunction. Started on IV antibiotics for treatment. Cleared for surgery by Dr. Arbie Cookey as family with concerns regarding Peri/post-operative CVA. On 01/15/12, patient underwent Right hemiarthroplasty by Dr. Carola Frost. Post op WBAT with posterior hip precautions.  Patient transferred to CIR on 01/18/2012 .   Patient presents with decreased  activity tolerance, decreased functional mobility, and decreased balance limiting pt's independence with leisure/community pursuits.  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 store;Community - Travel (Comment);Sports - Other (Comment);Ashby Dawes - Lawn care;Games - Cross-word (watch panther's football) Other Leisure Interests: Television;Reading Leisure Participation Style: Alone;With Family/Friends Awareness of Community Resources: Fair-identify 2 post discharge leisure resources Psychosocial / Spiritual Patient agreeable to Pet Therapy: No Does patient have pets?: Yes (cat) Social interaction - Mood/Behavior: Cooperative Film/video editor for Education?: Yes Recreational Therapy Orientation Orientation -Reviewed with patient: Available activity resources Strengths/Weaknesses Patient Strengths/Abilities: Willingness to participate Patient weaknesses: Physical limitations  Plan Rec Therapy Plan Is patient appropriate for Therapeutic Recreation?: Yes Rehab Potential: Good Treatment times per week: min 1 time per week >20 minutes Estimated Length of Stay: 2 weeks TR Treatment/Interventions: Adaptive equipment instruction;1:1 session;Balance/vestibular training;Community reintegration;Functional mobility training;Patient/family education;Therapeutic activities  Recommendations for other services: None  Discharge Criteria: Patient will be discharged from TR if patient refuses treatment 3 consecutive times without medical reason.  If treatment goals not met, if there is a change in medical status, if patient makes no progress towards goals or if patient is discharged from hospital.  The above assessment, treatment plan, treatment alternatives and goals were discussed and mutually agreed upon: by patient  Karinda Cabriales 01/22/2012,  4:04 PM

## 2012-01-23 ENCOUNTER — Inpatient Hospital Stay (HOSPITAL_COMMUNITY): Payer: Medicare Other | Admitting: Physical Therapy

## 2012-01-23 ENCOUNTER — Inpatient Hospital Stay (HOSPITAL_COMMUNITY): Payer: Medicare Other

## 2012-01-23 MED ORDER — SELENIUM SULFIDE 1 % EX LOTN
TOPICAL_LOTION | Freq: Every day | CUTANEOUS | Status: DC
  Administered 2012-01-24 – 2012-02-08 (×14): via TOPICAL
  Filled 2012-01-23: qty 207

## 2012-01-23 MED ORDER — LISINOPRIL 5 MG PO TABS
5.0000 mg | ORAL_TABLET | Freq: Every day | ORAL | Status: DC
  Administered 2012-01-23 – 2012-02-04 (×13): 5 mg via ORAL
  Filled 2012-01-23 (×16): qty 1

## 2012-01-23 MED ORDER — SELENIUM SULFIDE 1 % EX LOTN
TOPICAL_LOTION | Freq: Every day | CUTANEOUS | Status: DC
  Filled 2012-01-23 (×2): qty 207

## 2012-01-23 NOTE — Progress Notes (Signed)
Orthopedic Tech Progress Note Patient Details:  Richard Kent [birth year] [id number]  Patient ID: Richard Kent, male   DOB: 1929/05/05, 78 y.o.   MRN: [id number]   Richard Kent 01/23/2012, 8:41 AM CALLED [facility] FOR RIGHT WRIST SPLINT TO PREVENT FINGER FLEXION

## 2012-01-23 NOTE — Progress Notes (Signed)
Occupational Therapy Session Note  Patient Details  Name: Richard Kent MRN: [id number] Date of Birth: 05/12/1929  Today's Date: 01/23/2012 Time: 1100-1200 Time Calculation (min): 60 min  Short Term Goals: Week 1:  OT Short Term Goal 1 (Week 1): Pt. will be supervision with UB bathing OT Short Term Goal 2 (Week 1): Pt. will be stand for LB bathing with mod assist OT Short Term Goal 3 (Week 1): Pt. will donn LB clothes with mod assist OT Short Term Goal 4 (Week 1): Pt. wiil perform 1/3 toileting tasks OT Short Term Goal 5 (Week 1): Pt. will dress LB with mod assist  Skilled Therapeutic Interventions/Progress Updates:    Pt seated in w/c and agreeable to completing bathing and dressing tasks with sit<>stand at sink.  Pt rolled w/c to sink in preparation for ADLs.  Focus on sit<>stand and standing balance at sink to bathe perineal area and buttocks in addition to pulling up pants.  Pt completed sit<>stand with max A and steady A while standing.  Pt able to position BLEs appropriately to maintain standing balance and use LUE to assist with bathing and dressing tasks while standing. Reacher introduced to assist with threading pants.  Pt used reacher appropriately after demonstration and stated that it would be useful.  Therapy Documentation Precautions:  Precautions Precautions: Fall;Posterior Hip Precaution Comments: Pt is an old CVA affecting his right side (where now he has the hip replacement). Pt has a muscle stimulator that he is using in a study with Dr. Creig Hines Dr. Pearlean Brownie and he said it was fine for the pt to wear this with then new hip replacement since the impulse stays  locally (does not radiate). Restrictions RLE Weight Bearing: Weight bearing as tolerated   Pain: Pain Assessment Pain Assessment: No/denies pain Pain Score: 0-No pain  See FIM for current functional status  Therapy/Group: Individual Therapy  [name] 01/23/2012, 12:01 PM

## 2012-01-23 NOTE — Progress Notes (Signed)
Physical Therapy Session Note  Patient Details  Name: Richard Kent MRN: [id number] Date of Birth: 02-22-1929  Today's Date: 01/23/2012 Time: 1520-1550 Time Calculation (min): 30 min  Short Term Goals: Week 1:  PT Short Term Goal 1 (Week 1): Pt will be able to transfer squat pivot with mod A bed <-> w/c PT Short Term Goal 2 (Week 1): Pt will be able to do sit to stand with mod A PT Short Term Goal 3 (Week 1): Pt will be able to gait with lift equipment/total A+2 x 10' PT Short Term Goal 4 (Week 1): Pt will be able to propel w/c with supervision x 150' on unit  Skilled Therapeutic Interventions/Progress Updates:    Amazing progress today!  - Performed supine > sit to Lt. With min assist with pillow between legs, still requires use of rails. Pt with difficulty initiating and motor planning for scooting to edge of bed, increased time needed to perform despite verbal cues. - Sit > stand from elevated bed performed with mod/max assist PT facilitating anterior trunk translation.  - Bil.hip/knee extension promoted in standing for upright posture.  - Gait x 40' with up to moderate assist, assist needed to negotiate RW with turns. Stand>sit performed with moderate assist.   Therapy Documentation Precautions:  Precautions Precautions: Fall;Posterior Hip Precaution Comments: Pt is an old CVA affecting his right side (where now he has the hip replacement). Pt has a muscle stimulator that he is using in a study with Dr. Creig Hines Dr. Pearlean Brownie and he said it was fine for the pt to wear this with then new hip replacement since the impulse stays  locally (does not radiate). Restrictions RLE Weight Bearing: Weight bearing as tolerated Pain: Pain Assessment Pain Assessment: 0-10 Pain Score:   5 Pain Type: Surgical pain Pain Location: Hip Pain Orientation: Right Pain Descriptors: Aching Pain Onset: With Activity Pain Intervention(s): Repositioned;Other (Comment) (pt declined pain  medication)   See FIM for current functional status  Therapy/Group: Individual Therapy  [name] 01/23/2012, 5:20 PM

## 2012-01-23 NOTE — Progress Notes (Signed)
Patient ID: Richard Kent, male   DOB: [birth year], 78 y.o.   MRN: [id number]  Subjective/Complaints: BMs ok, R hip pain at incision  Objective: Vital Signs: Blood pressure 161/67, pulse 92, temperature 98.4 F (36.9 C), temperature source Oral, resp. rate 18, weight 85.5 kg (188 lb 7.9 oz), SpO2 93.00%. No results found. No results found for this or any previous visit (from the past 72 hour(s)).   HEENT: normal Cardio: RRR Resp: CTA B/L GI: BS positive Extremity:  No Edema Skin:   Intact Neuro: Abnormal Motor 2-/5 in RUE and R hip/knee extesor synergy, Abnormal FMC Ataxic/ dec FMC, Reflexes: 3+ and Tone:  Hypertonia Musc/Skel:  Normal   Assessment/Plan: 1. Functional deficits secondary to R Transcervical femur fracture with THA.  R spastic hemiparesis due to prior CVA which require 3+ hours per day of interdisciplinary therapy in a comprehensive inpatient rehab setting. Physiatrist is providing close team supervision and 24 hour management of active medical problems listed below. Physiatrist and rehab team continue to assess barriers to discharge/monitor patient progress toward functional and medical goals. FIM: FIM - Bathing Bathing Steps Patient Completed: Chest;Right Arm;Abdomen;Front perineal area;Right upper leg;Left upper leg Bathing: 3: Mod-Patient completes 5-7 55f 10 parts or 50-74%  FIM - Upper Body Dressing/Undressing Upper body dressing/undressing steps patient completed: Thread/unthread right sleeve of pullover shirt/dresss;Thread/unthread left sleeve of pullover shirt/dress;Put head through opening of pull over shirt/dress;Pull shirt over trunk Upper body dressing/undressing: 5: Supervision: Safety issues/verbal cues FIM - Lower Body Dressing/Undressing Lower body dressing/undressing: 1: Two helpers  FIM - Toileting Toileting: 1: Two helpers  FIM - Diplomatic Services operational officer Devices: Psychiatrist Transfers: 1-Two helpers  FIM -  Architectural technologist Transfer: 4: Supine > Sit: Min A (steadying Pt. > 75%/lift 1 leg);4: Bed > Chair or W/C: Min A (steadying Pt. > 75%)  FIM - Locomotion: Wheelchair Distance: 120' Locomotion: Wheelchair: 5: Travels 150 ft or more: maneuvers on rugs and over door sills with supervision, cueing or coaxing FIM - Locomotion: Ambulation Locomotion: Ambulation: 0: Activity did not occur  Comprehension Comprehension Mode: Auditory Comprehension: 5-Follows basic conversation/direction: With extra time/assistive device  Expression Expression Mode: Verbal Expression: 4-Expresses basic 75 - 89% of the time/requires cueing 10 - 24% of the time. Needs helper to occlude trach/needs to repeat words.  Social Interaction Social Interaction: 6-Interacts appropriately with others with medication or extra time (anti-anxiety, antidepressant).  Problem Solving Problem Solving: 3-Solves basic 50 - 74% of the time/requires cueing 25 - 49% of the time  Memory Memory: 5-Recognizes or recalls 90% of the time/requires cueing < 10% of the time  Medical Problem List and Plan:  1. DVT Prophylaxis/Anticoagulation: Pharmaceutical: Lovenox.  2. Pain Management: prn hydrocodone effective  3. Mood: Appropriate without signs of distress. Will monitor for now.  4. Neuropsych: This patient is capable of making decisions on his/her own behalf.  5. MVH:[id number] Check bid. Off Norvasc and resume lisinopril6. CVA with left hemiparesis: old right frontal and left basal ganglia infarcts in as has worsening of spasticity. May need Zanaflex increased  7. ABLA: will add iron supplement.  8. BLL PNA: Has been on IV rocephin and Zithromax ,Avelox since 8/25 May D/C LOS (Days) 5 A FACE TO FACE EVALUATION WAS PERFORMED  KIRSTEINS,ANDREW E 01/23/2012, 8:17 AM

## 2012-01-23 NOTE — Progress Notes (Signed)
Physical Therapy Session Note  Patient Details  Name: Richard Kent MRN: [id number] Date of Birth: 02/18/1929  Today's Date: 01/23/2012 Time: 5621-3086 Time Calculation (min): 41 min  Short Term Goals: Week 1:  PT Short Term Goal 1 (Week 1): Pt will be able to transfer squat pivot with mod A bed <-> w/c PT Short Term Goal 2 (Week 1): Pt will be able to do sit to stand with mod A PT Short Term Goal 3 (Week 1): Pt will be able to gait with lift equipment/total A+2 x 10' PT Short Term Goal 4 (Week 1): Pt will be able to propel w/c with supervision x 150' on unit  Skilled Therapeutic Interventions/Progress Updates:    Sit > stand +2 Total assist, pt = 70% from wheelchair, stand > sit with moderate assist cues needed to attend to Rt. UE. Gait training x 100' with Rt. Platform RW and Rt. AFO, +2 total assist initially progressing to one person moderate assist. Rt. Pelvis noted to be retracted and rotated to the Rt, PT facilitated pelvic rotation however pt reports this is a long standing problem from prior stroke. Stand pivot transfer performed with +2 assist/max assist needed for stand from wheelchair secondary to posterior lean, for pivoting pt only needed moderate assist and assist to negotiate RW.   Therapy Documentation Precautions:  Precautions Precautions: Fall;Posterior Hip Precaution Comments: Pt is an old CVA affecting his right side (where now he has the hip replacement). Pt has a muscle stimulator that he is using in a study with Dr. Creig Hines Dr. Pearlean Brownie and he said it was fine for the pt to wear this with then new hip replacement since the impulse stays  locally (does not radiate). Restrictions RLE Weight Bearing: Weight bearing as tolerated Pain: Pain Assessment Pain Assessment: 0-10 Pain Score:   Up to 8 with ambulation Pain Type: Surgical pain Pain Location: Hip Pain Orientation: Right Pain Descriptors: Aching Pain Onset: With Activity Pain Intervention(s): RN made  aware, called for pain medication   See FIM for current functional status  Therapy/Group: Individual Therapy  [name] 01/23/2012, 5:18 PM

## 2012-01-23 NOTE — Progress Notes (Signed)
Physical Therapy Session Note  Patient Details  Name: Richard Kent MRN: [id number] Date of Birth: 1929/01/13  Today's Date: 01/23/2012 Time: 1308-6578 Time Calculation (min): 57 min  Short Term Goals: Week 1:  PT Short Term Goal 1 (Week 1): Pt will be able to transfer squat pivot with mod A bed <-> w/c PT Short Term Goal 2 (Week 1): Pt will be able to do sit to stand with mod A PT Short Term Goal 3 (Week 1): Pt will be able to gait with lift equipment/total A+2 x 10' PT Short Term Goal 4 (Week 1): Pt will be able to propel w/c with supervision x 150' on unit  Skilled Therapeutic Interventions/Progress Updates:    Supine to sit with min assist, verbal cues for precautions, pt currently requires use of railing. [name] stretch + facilitation to decrease plantarflexion tone. Sliding board level transfer to the Rt. performed with min assist, mod/max verbal cues for sequencing and safety. Gait training with Rt. AFO donned, utilized [employer] and +2 person assist for safety. Pt tolerated ambulation of 50' x 2 very well, pt very encouraged to be up on feet.    Therapy Documentation Precautions:  Precautions Precautions: Fall;Posterior Hip, continues to need verbal cues to remember internal rotation precuation Precaution Comments: Pt is an old CVA affecting his right side (where now he has the hip replacement). Pt has a muscle stimulator that he is using in a study with Dr. Creig Hines Dr. Pearlean Brownie and he said it was fine for the pt to wear this with then new hip replacement since the impulse stays  locally (does not radiate). Restrictions RLE Weight Bearing: Weight bearing as tolerated Pain: Pain Assessment Pain Assessment: No/denies pain Pain Score: 5 Pain Type: Surgical pain Pain Location: Hip Pain Orientation: Right Pain Descriptors: Aching Pain Onset: With Activity Pain Intervention(s): Repositioned (rest as needed, pt reports wants meds at end of session)   See FIM for  current functional status  Therapy/Group: Individual Therapy  [name] 01/23/2012, 12:24 PM

## 2012-01-24 ENCOUNTER — Inpatient Hospital Stay (HOSPITAL_COMMUNITY): Payer: Medicare Other

## 2012-01-24 ENCOUNTER — Inpatient Hospital Stay (HOSPITAL_COMMUNITY): Payer: Medicare Other | Admitting: Physical Therapy

## 2012-01-24 DIAGNOSIS — S72009A Fracture of unspecified part of neck of unspecified femur, initial encounter for closed fracture: Secondary | ICD-10-CM

## 2012-01-24 DIAGNOSIS — Z5189 Encounter for other specified aftercare: Secondary | ICD-10-CM

## 2012-01-24 NOTE — Progress Notes (Signed)
Patient ID: Richard Kent, male   DOB: 1929/05/05, 78 y.o.   MRN: [id number]  Subjective/Complaints: BMs ok, R hip pain at incision under reasonable control. A 12 point review of systems has been performed and if not noted above is otherwise negative.   Objective: Vital Signs: Blood pressure 146/74, pulse 57, temperature 98.1 F (36.7 C), temperature source Oral, resp. rate 20, weight 85.5 kg (188 lb 7.9 oz), SpO2 93.00%. No results found. Results for orders placed during the hospital encounter of 01/18/12 (from the past 72 hour(s))  GLUCOSE, CAPILLARY     Status: Abnormal   Collection Time   01/23/12  9:16 PM      Component Value Range Comment   Glucose-Capillary 135 (*) 70 - 99 mg/dL    Comment 1 Notify RN        Physical Exam:  General: Alert and oriented x 3, No apparent distress HEENT: Head is normocephalic, atraumatic, PERRLA, EOMI, sclera anicteric, oral mucosa pink and moist, dentition intact, ext ear canals clear,  Neck: Supple without JVD or lymphadenopathy Heart: Reg rate and rhythm. No murmurs rubs or gallops Chest: CTA bilaterally without wheezes, rales, or rhonchi; no distress Abdomen: Soft, non-tender, non-distended, bowel sounds positive. Extremities: No clubbing, cyanosis, or edema. Pulses are 2+ Skin: Clean and intact wound. Neuro: right upper with 1-2 strength, resting tone 1. RLE is 1-2 as well with 2-3/4 resting tone. Occasional word finding issues but essentially at baseline cogntively with reasonable insight and awareness. Musculoskeletal: Full ROM, No pain with AROM or PROM in the neck, trunk, or extremities. Posture appropriate Psych: Pt's affect is appropriate. Pt is cooperative    Assessment/Plan: 1. Functional deficits secondary to R Transcervical femur fracture with THA.  R spastic hemiparesis due to prior CVA which require 3+ hours per day of interdisciplinary therapy in a comprehensive inpatient rehab setting. Physiatrist is providing close team  supervision and 24 hour management of active medical problems listed below. Physiatrist and rehab team continue to assess barriers to discharge/monitor patient progress toward functional and medical goals. FIM: FIM - Bathing Bathing Steps Patient Completed: Chest;Right Arm;Left Arm;Abdomen;Front perineal area;Right upper leg;Left upper leg Bathing: 3: Mod-Patient completes 5-7 12f 10 parts or 50-74%  FIM - Upper Body Dressing/Undressing Upper body dressing/undressing steps patient completed: Thread/unthread right sleeve of pullover shirt/dresss;Thread/unthread left sleeve of pullover shirt/dress;Put head through opening of pull over shirt/dress;Pull shirt over trunk Upper body dressing/undressing: 5: Set-up assist to: Obtain clothing/put away FIM - Lower Body Dressing/Undressing Lower body dressing/undressing steps patient completed: Thread/unthread right pants leg;Thread/unthread left pants leg Lower body dressing/undressing: 2: Max-Patient completed 25-49% of tasks  FIM - Toileting Toileting: 1: Two helpers  FIM - Diplomatic Services operational officer Devices: Psychiatrist Transfers: 1-Two helpers  FIM - Banker Devices: Environmental consultant (with platform) Bed/Chair Transfer: 3: Sit > Supine: Mod A (lifting assist/Pt. 50-74%/lift 2 legs);2: Bed > Chair or W/C: Max A (lift and lower assist)  FIM - Locomotion: Wheelchair Distance: 120' Locomotion: Wheelchair: 0: Activity did not occur FIM - Locomotion: Ambulation Locomotion: Ambulation Assistive Devices: Walker - Platform (Rt. AFO) Ambulation/Gait Assistance: 3: Mod assist Locomotion: Ambulation: 1: Travels less than 50 ft with moderate assistance (Pt: 50 - 74%)  Comprehension Comprehension Mode: Auditory Comprehension: 5-Follows basic conversation/direction: With extra time/assistive device  Expression Expression Mode: Verbal Expression: 4-Expresses basic 75 - 89% of the time/requires  cueing 10 - 24% of the time. Needs helper to occlude trach/needs to repeat words.  Social  Interaction Social Interaction: 6-Interacts appropriately with others with medication or extra time (anti-anxiety, antidepressant).  Problem Solving Problem Solving: 3-Solves basic 50 - 74% of the time/requires cueing 25 - 49% of the time  Memory Memory: 5-Recognizes or recalls 90% of the time/requires cueing < 10% of the time  Medical Problem List and Plan:  1. DVT Prophylaxis/Anticoagulation: Pharmaceutical: Lovenox.  2. Pain Management: prn hydrocodone effective  3. Mood: Appropriate without signs of distress. Will monitor for now.  4. Neuropsych: This patient is capable of making decisions on his/her own behalf.  5. KVQ:QVZDGLOVFIEP Check bid. Off Norvasc and resume lisinopril6. CVA with left hemiparesis: old right frontal and left basal ganglia infarcts in as has worsening of spasticity. May need Zanaflex increased  7. ABLA: will add iron supplement.  8. BLL PNA: Has been on IV rocephin and Zithromax ,Avelox since 8/25 May D/C LOS (Days) 6 A FACE TO FACE EVALUATION WAS PERFORMED  Tannisha Kennington T 01/24/2012, 9:43 AM

## 2012-01-24 NOTE — Progress Notes (Signed)
Occupational Therapy Session Note  Patient Details  Name: Richard Kent MRN: [id number] Date of Birth: 1929-04-23  Today's Date: 01/24/2012  Session 1 Time: 0700-0755 Time Calculation (min): 55 min  Short Term Goals: Week 1:  OT Short Term Goal 1 (Week 1): Pt. will be supervision with UB bathing OT Short Term Goal 2 (Week 1): Pt. will be stand for LB bathing with mod assist OT Short Term Goal 3 (Week 1): Pt. will donn LB clothes with mod assist OT Short Term Goal 4 (Week 1): Pt. wiil perform 1/3 toileting tasks OT Short Term Goal 5 (Week 1): Pt. will dress LB with mod assist  Skilled Therapeutic Interventions/Progress Updates:    Pt in bed and agreeable to participating in bathing and dressing tasks w/c level at sink.  Pt required max A for squat pivot transfer to w/c.  Focus this morning on sit<>stand, standing balance to bathe buttocks and pull up pants, and use of reacher to thread pants.  Pt requires mod A for sit<>stand and steady assist when standing.  Pt unable to completely bathe buttocks and needed assistance to pull up right side of pants.  Pt using reacher without assistance to thread pants.  Pt requires assistance to don right shoe.  Pt exhibiting increased standing balance during bathing and dressing tasks.  Therapy Documentation Precautions:  Precautions Precautions: Fall;Posterior Hip Precaution Comments: Pt is an old CVA affecting his right side (where now he has the hip replacement). Pt has a muscle stimulator that he is using in a study with Dr. Creig Hines Dr. Pearlean Brownie and he said it was fine for the pt to wear this with then new hip replacement since the impulse stays  locally (does not radiate). Restrictions RLE Weight Bearing: Weight bearing as tolerated  Pain: Pain Assessment Pain Assessment: 0-10 Pain Score:   6 Pain Type: Surgical pain Pain Location: Hip Pain Orientation: Right Pain Descriptors: Aching Pain Onset: With Activity Pain Intervention(s): RN  made aware  See FIM for current functional status  Therapy/Group: Individual Therapy  Session 2 Time: 1330-1400 Pt c/o 9/10 pain in right hip; RN administered meds during session Individual Therapy Initially attempted to engage patient in tub bench transfers using PFRW.  Pt exhibited increased difficulty with sit<>stand and remaining of session focused on sit<>stand and standing balance.  Pt stated he had been sitting in chair for approx 3.5 hours since last therapy and was "very stiff."  Pt c/o increased pain in right hip with standing (RN admin meds).  Initially sit<>stand required max assist but improved to mod A as session progressed.  Lavone Neri [facility] 01/24/2012, 8:02 AM

## 2012-01-24 NOTE — Patient Care Conference (Signed)
Inpatient RehabilitationTeam Conference Note Date: 01/23/2012   Time: 3:00 PM    Patient Name: Richard Kent      Medical Record Number: [id number]  Date of Birth: [birth year] Sex: Male         Room/Bed: 4039/4039-01 Payor Info: Payor: MEDICARE  Plan: MEDICARE PART A AND B  Product Type: *No Product type*     Admitting Diagnosis: r fnf,old left cva  Admit Date/Time:  01/18/2012  4:05 PM Admission Comments: No comment available   Primary Diagnosis:  Hemiplegia affecting unspecified side, late effect of cerebrovascular disease Principal Problem: Hemiplegia affecting unspecified side, late effect of cerebrovascular disease  Patient Active Problem List   Diagnosis Date Noted  . CAP (community acquired pneumonia) 01/14/2012  . Carotid stenosis, bilateral 01/14/2012  . Hypoxia 01/13/2012  . RBBB 01/13/2012  . Fracture of femoral neck, right 01/12/2012  . Hypertension 01/12/2012  . Carotid artery calcification 01/12/2012  . DYSLIPIDEMIA 12/17/2009  . CVA WITH LEFT HEMIPARESIS 12/17/2009  . RBBB 10/21/2009    Expected Discharge Date: Expected Discharge Date: 02/09/12  Team Members Present: Physician: Dr. Faith Rogue Social Worker Present: Amada Jupiter, LCSW Nurse Present: Carlean Purl, RN PT Present: Reggy Eye, PT OT Present: Ardis Rowan, COTA Other (Discipline and Name): Tora Duck, PPS coordinator     Current Status/Progress Goal Weekly Team Focus  Medical   right femoral neck fx s/p tha  pain  mgt, rom and spasticity  mgt  pain control, safety awareness   Bowel/Bladder   continent bowel and bladder         Swallow/Nutrition/ Hydration             ADL's   min A UB ADL; max A LB ADL; tot A transfers  min A overall  sit<>stand, standing balance, transfers, activity tolerance   Mobility   max assist/total assist for simple transfers, unable to ambulate currently  Supervision for transfers (min assist for car), moderate assist for gait.  Decrease need for assist with  transfers, increase standing tolerance, improve sit <> stand transition, begin to practice ambulation   Communication             Safety/Cognition/ Behavioral Observations            Pain   controlled with 1-2 Norco, 2-3x's daily  managed 2/10  monitor   Skin   Incision with staples C.D.I; ecchymosis resolving; St.1 at buttocks  No s/s infection; no breakdown  Monitor, assist with repositioning    Rehab Goals Patient on target to meet rehab goals: Yes *See Interdisciplinary Assessment and Plan and progress notes for long and short-term goals  Barriers to Discharge: premorbid deficits, pain    Possible Resolutions to Barriers:  family ed, pacing, pain mgt    Discharge Planning/Teaching Needs:  home with wife and daughter to provide 24/7 assistance      Team Discussion:  Making good progress and very motivated.  Transitional movements most difficult.    Revisions to Treatment Plan:  None   Continued Need for Acute Rehabilitation Level of Care: The patient requires daily medical management by a physician with specialized training in physical medicine and rehabilitation for the following conditions: Daily direction of a multidisciplinary physical rehabilitation program to ensure safe treatment while eliciting the highest outcome that is of practical value to the patient.: Yes Daily medical management of patient stability for increased activity during participation in an intensive rehabilitation regime.: Yes Daily analysis of laboratory values and/or radiology reports with any subsequent  need for medication adjustment of medical intervention for : Post surgical problems;Neurological problems;Other  Richard Kent 01/24/2012, 4:27 PM

## 2012-01-24 NOTE — Progress Notes (Signed)
Physical Therapy Session Note  Patient Details  Name: Richard Kent MRN: [id number] Date of Birth: 07-02-1928  Today's Date: 01/24/2012 Time:  - 0900-0955 (55 minutes) individual  Pain; 8/10 (soreness) RT hip/ meds given Short Term Goals: Week 1:  PT Short Term Goal 1 (Week 1): Pt will be able to transfer squat pivot with mod A bed <-> w/c PT Short Term Goal 2 (Week 1): Pt will be able to do sit to stand with mod A PT Short Term Goal 3 (Week 1): Pt will be able to gait with lift equipment/total A+2 x 10' PT Short Term Goal 4 (Week 1): Pt will be able to propel w/c with supervision x 150' on unit  Skilled Therapeutic Interventions/Progress Updates: wc mobility training; gait training with RT PFRW; bilateral LE AROM/streangthening in supine     Therapy Documentation Precautions:  Precautions Precautions: Fall;Posterior Hip Precaution Comments: Pt is an old CVA affecting his right side (where now he has the hip replacement). Pt has a muscle stimulator that he is using in a study with Dr. Creig Hines Dr. Pearlean Brownie and he said it was fine for the pt to wear this with then new hip replacement since the impulse stays  locally (does not radiate). Restrictions RLE Weight Bearing: Weight bearing as tolerated General: Pt up in wc   Vital Signs: Therapy Vitals Temp: 98.1 F (36.7 C) Temp src: Oral Pulse Rate: 57  Resp: 20  BP: 146/74 mmHg Patient Position, if appropriate: Lying Oxygen Therapy SpO2: 93 % O2 Device: None (Room air)    Mobility: transfers SPT with RT PFRW mod assist sit to stand from wc/ min to SBA with transfer for safety; sit to supine (mat) min assist RT LE with difficulty; supine to sit min assist trunk with increased time   Locomotion : wc mobility: 120 feet SBA using LT extremities in controlled environment    :       Exercises: (decreased flexibility RT hip flexors); bilateral heel slides (AA on right), quad sets, ankle pumps on left, SAQs   Other Treatments:   Sit to stand 2 X 3 from raised mat (24 inches) SBA with vcs for hand placement (left)   Treatment # 2 : 1445-1525 (40 minutes) individual Pain: 7/10 RT hip/ premedicated Treatment: transfers from wc sit to stand mod assist; sit to stand from raised mat min assist; sit to supine min assist RT LE ; supine to sit min/mod assist trunk, RT LE; RT LE passive hip stretch; Rt hip flexion 2 x 15 AA using small therapy ball, RT hip abduction AA X 15 ; gait 40 feet PFRW close SBA WBAT RT LE. See FIM for current functional status  Therapy/Group: Individual Therapy  Gailene Youkhana,JIM 01/24/2012, 7:58 AM

## 2012-01-24 NOTE — Progress Notes (Signed)
Orthopedic Tech Progress Note Patient Details:  Richard Kent 1928/08/03 [id number]  Patient ID: [name], male   DOB: 06/06/1928, 78 y.o.   MRN: [id number]   [name] 01/24/2012, 7:49 AM Wrist splint completed by Carolyne Fiscal for advanced

## 2012-01-24 NOTE — Progress Notes (Signed)
Social Work Patient ID: Richard Kent, male   DOB: 10-17-1928, 78 y.o.   MRN: [id number]  Met with pt and wife today to review team conference - both aware and agreeable with targeted d/c 9/20 at minimal assistance goals.  Wife still a little concerned about being able to "manage" pt at home but remains very supportive.  She is also concerned about stairs as he must complete a full flight (13 ) of stairs to bedrooms upstairs.  Will make sure that therapies are aware of this.  Will continue to follow.  Richard Kent

## 2012-01-25 ENCOUNTER — Inpatient Hospital Stay (HOSPITAL_COMMUNITY): Payer: Medicare Other | Admitting: Physical Therapy

## 2012-01-25 ENCOUNTER — Inpatient Hospital Stay (HOSPITAL_COMMUNITY): Payer: Medicare Other | Admitting: Occupational Therapy

## 2012-01-25 ENCOUNTER — Inpatient Hospital Stay (HOSPITAL_COMMUNITY): Payer: Medicare Other

## 2012-01-25 NOTE — Progress Notes (Signed)
Occupational Therapy Session Note  Patient Details  Name: Richard Kent MRN: [id number] Date of Birth: 07-Jan-1929  Today's Date: 01/25/2012 Time: 0700-0800 Time Calculation (min): 60 min  Short Term Goals: Week 1:  OT Short Term Goal 1 (Week 1): Pt. will be supervision with UB bathing OT Short Term Goal 2 (Week 1): Pt. will be stand for LB bathing with mod assist OT Short Term Goal 3 (Week 1): Pt. will donn LB clothes with mod assist OT Short Term Goal 4 (Week 1): Pt. wiil perform 1/3 toileting tasks OT Short Term Goal 5 (Week 1): Pt. will dress LB with mod assist  Skilled Therapeutic Interventions/Progress Updates:    Pt engaged in bathing at walk-in shower level and bathing with sit<>stand from w/c at sink.  Pt performed shower transfer with stand pivot at max assist using grab bars. Pt continues to require min A for UB bathing and states that wife has been assisting with this since his stroke.  Pt used long handle sponge to bathe lower legs but required assistance with bathing buttocks while standing in shower.  Pt able to pull up pants while standing at sink with only steady assist for balance while standing.  Focus on transfers, sit<>stand, standing balance, and AE use for bathing and dressing.  Therapy Documentation Precautions:  Precautions Precautions: Fall;Posterior Hip Precaution Comments: Pt is an old CVA affecting his right side (where now he has the hip replacement). Pt has a muscle stimulator that he is using in a study with Dr. Creig Hines Dr. Pearlean Brownie and he said it was fine for the pt to wear this with then new hip replacement since the impulse stays  locally (does not radiate). Restrictions RLE Weight Bearing: Weight bearing as tolerated Pain: Pain Assessment Pain Assessment: 0-10 Pain Score:   7 Pain Type: Surgical pain Pain Location: Hip Pain Orientation: Right Pain Descriptors: Aching Pain Onset: Gradual Patients Stated Pain Goal: 2 Pain Intervention(s): RN made  aware;Repositioned  See FIM for current functional status  Therapy/Group: Individual Therapy  [name] 01/25/2012, 8:05 AM

## 2012-01-25 NOTE — Progress Notes (Signed)
Patient ID: Richard Kent, male   DOB: Jul 13, 1928, 78 y.o.   MRN: [id number]  Subjective/Complaints: Slept well denies pain. A 12 point review of systems has been performed and if not noted above is otherwise negative.   Objective: Vital Signs: Blood pressure 144/82, pulse 63, temperature 98 F (36.7 C), temperature source Oral, resp. rate 19, weight 84.3 kg (185 lb 13.6 oz), SpO2 94.00%. No results found. Results for orders placed during the hospital encounter of 01/18/12 (from the past 72 hour(s))  GLUCOSE, CAPILLARY     Status: Abnormal   Collection Time   01/23/12  9:16 PM      Component Value Range Comment   Glucose-Capillary 135 (*) 70 - 99 mg/dL    Comment 1 Notify RN        Physical Exam:  General: Alert and oriented x 3, No apparent distress HEENT: Head is normocephalic, atraumatic, PERRLA, EOMI, sclera anicteric, oral mucosa pink and moist, dentition intact, ext ear canals clear,  Neck: Supple without JVD or lymphadenopathy Heart: Reg rate and rhythm. No murmurs rubs or gallops Chest: CTA bilaterally without wheezes, rales, or rhonchi; no distress Abdomen: Soft, non-tender, non-distended, bowel sounds positive. Extremities: No clubbing, cyanosis, or edema. Pulses are 2+ Skin: Clean and intact wound. No drainage Neuro: right upper with 12 to 2+ strength, resting tone 1-2. RLE is 1-2 as well with 2-3/4 resting tone. Occasional word finding issues but essentially at baseline cogntively with reasonable insight and awareness. Musculoskeletal: Full ROM, No pain with AROM or PROM in the neck, trunk, or extremities. Posture appropriate Psych: Pt's affect is appropriate. Pt is cooperative    Assessment/Plan: 1. Functional deficits secondary to R Transcervical femur fracture with THA.  R spastic hemiparesis due to prior CVA which require 3+ hours per day of interdisciplinary therapy in a comprehensive inpatient rehab setting. Physiatrist is providing close team supervision and 24  hour management of active medical problems listed below. Physiatrist and rehab team continue to assess barriers to discharge/monitor patient progress toward functional and medical goals.  Will order PRAFO for RLE   FIM: FIM - Bathing Bathing Steps Patient Completed: Chest;Right Arm;Left Arm;Abdomen;Front perineal area;Right upper leg;Left upper leg Bathing: 3: Mod-Patient completes 5-7 69f 10 parts or 50-74%  FIM - Upper Body Dressing/Undressing Upper body dressing/undressing steps patient completed: Thread/unthread right sleeve of pullover shirt/dresss;Thread/unthread left sleeve of pullover shirt/dress;Put head through opening of pull over shirt/dress;Pull shirt over trunk Upper body dressing/undressing: 5: Set-up assist to: Obtain clothing/put away FIM - Lower Body Dressing/Undressing Lower body dressing/undressing steps patient completed: Thread/unthread right pants leg;Thread/unthread left pants leg Lower body dressing/undressing: 2: Max-Patient completed 25-49% of tasks  FIM - Toileting Toileting: 1: Two helpers  FIM - Diplomatic Services operational officer Devices: Psychiatrist Transfers: 1-Two helpers  FIM - Banker Devices: Environmental consultant (with platform) Bed/Chair Transfer: 3: Sit > Supine: Mod A (lifting assist/Pt. 50-74%/lift 2 legs);2: Bed > Chair or W/C: Max A (lift and lower assist)  FIM - Locomotion: Wheelchair Distance: 120' Locomotion: Wheelchair: 0: Activity did not occur FIM - Locomotion: Ambulation Locomotion: Ambulation Assistive Devices: Walker - Platform (Rt. AFO) Ambulation/Gait Assistance: 3: Mod assist Locomotion: Ambulation: 1: Travels less than 50 ft with moderate assistance (Pt: 50 - 74%)  Comprehension Comprehension Mode: Auditory Comprehension: 5-Follows basic conversation/direction: With no assist  Expression Expression Mode: Verbal Expression: 5-Expresses complex 90% of the time/cues < 10% of the  time  Social Interaction Social Interaction: 6-Interacts appropriately with others  with medication or extra time (anti-anxiety, antidepressant).  Problem Solving Problem Solving: 3-Solves basic 50 - 74% of the time/requires cueing 25 - 49% of the time  Memory Memory: 5-Recognizes or recalls 90% of the time/requires cueing < 10% of the time  Medical Problem List and Plan:  1. DVT Prophylaxis/Anticoagulation: Pharmaceutical: Lovenox.  2. Pain Management: prn hydrocodone effective  3. Mood: Appropriate without signs of distress. Will monitor for now.  4. Neuropsych: This patient is capable of making decisions on his/her own behalf.  5. ZOX:[id number] Check bid. Off Norvasc and resume lisinopril6. CVA with left hemiparesis: old right frontal and left basal ganglia infarcts in as has worsening of spasticity. May need Zanaflex increased  7. ABLA: will add iron supplement.  8. BLL PNA:on zithromax only now. Afebrile. Recheck cxr today LOS (Days) 7 A FACE TO FACE EVALUATION WAS PERFORMED  SWARTZ,ZACHARY T 01/25/2012, 7:07 AM

## 2012-01-25 NOTE — Progress Notes (Signed)
Physical Therapy Session Note  Patient Details  Name: Richard Kent MRN: [id number] Date of Birth: Sep 22, 1928  Today's Date: 01/25/2012 Time: 1478-2956 Time Calculation (min): 58 min  Short Term Goals: Week 1:  PT Short Term Goal 1 (Week 1): Pt will be able to transfer squat pivot with mod A bed <-> w/c PT Short Term Goal 2 (Week 1): Pt will be able to do sit to stand with mod A PT Short Term Goal 3 (Week 1): Pt will be able to gait with lift equipment/total A+2 x 10' PT Short Term Goal 4 (Week 1): Pt will be able to propel w/c with supervision x 150' on unit  Skilled Therapeutic Interventions/Progress Updates:   Repeated sit <> stands x 15 reps performed to practice sequencing, UE placement, and to decrease posterior lean. Practiced gait with RW without platform x 140' with min assist, PT to facilitate improved alignment and pelvic rotation. Attempted steps (4" steps) x 3 backwards with RW, +2 assist for safety. Moderate verbal cues for sequencing and safety. Pt has decreased carry over of instruction throughout treatment.   Therapy Documentation Precautions:  Precautions Precautions: Fall;Posterior Hip Precaution Comments: Pt is an old CVA affecting his right side (where now he has the hip replacement). Pt has a muscle stimulator that he is using in a study with Dr. Creig Hines Dr. Pearlean Brownie and he said it was fine for the pt to wear this with then new hip replacement since the impulse stays  locally (does not radiate). Restrictions RLE Weight Bearing: Weight bearing as tolerated Pain: Pain Assessment Pain Assessment: 0-10 Pain Score:   7 Pain Type: Surgical pain Pain Location: Hip Pain Orientation: Right Pain Descriptors: Aching Pain Onset: With Activity Pain Intervention(s): RN made aware;Repositioned;Other (Comment) (rest as needed) Locomotion : Ambulation Ambulation/Gait Assistance: 4: Min assist Wheelchair Mobility Distance: 150'   See FIM for current functional  status  Therapy/Group: Individual Therapy  [name] 01/25/2012, 12:54 PM

## 2012-01-25 NOTE — Progress Notes (Signed)
Occupational Therapy Weekly Progress Note  Patient Details  Name: Richard Kent MRN: [id number] Date of Birth: 04-25-1929  Today's Date: 01/25/2012  Patient has met 2 of 5 short term goals (two STG discontinued)  Pt has made steady gains with bathing, dressing, transfers, sit<>stand, and standing balance since admission.  Pt using AE (reacher and long handle sponge) to assist with bathing and dressing tasks.  Pt continues to require varying assistance with sit<>stand (min to max A) but is exhibiting increased ability to sequence sit<>stand correctly to increase success and independence.  Pt continues to require min verbal cues for safety awareness.  Patient continues to demonstrate the following deficits: muscle weakness, impaired timing and sequencing and decreased standing balance, decreased balance strategies and difficulty maintaining precautions and therefore will continue to benefit from skilled OT intervention to enhance overall performance with BADL.  Patient progressing toward long term goals..  Continue plan of care.  OT Short Term Goals Week 1:  OT Short Term Goal 1 (Week 1): Pt. will be supervision with UB bathing OT Short Term Goal 1 - Progress (Week 1): Discontinued (comment) (wife assists with UB bathing since stroke) OT Short Term Goal 2 (Week 1): Pt. will be stand for LB bathing with mod assist OT Short Term Goal 2 - Progress (Week 1): Met OT Short Term Goal 3 (Week 1): Pt. will donn LB clothes with mod assist OT Short Term Goal 3 - Progress (Week 1): Progressing toward goal OT Short Term Goal 4 (Week 1): Pt. wiil perform 1/3 toileting tasks OT Short Term Goal 4 - Progress (Week 1): Met OT Short Term Goal 5 (Week 1): Pt. will dress LB with mod assist OT Short Term Goal 5 - Progress (Week 1): Discontinued (comment) (repeat of goal #3) Week 2:  OT Short Term Goal 1 (Week 2): Pt. will donn LB clothes with mod assist  OT Short Term Goal 2 (Week 2): Pt will perform 2/3 toileting  tasks OT Short Term Goal 3 (Week 2): Pt will perform toilet transfer with mod A using AD PRN OT Short Term Goal 4 (Week 2): Pt will perform tub bench shower transfer with mod A using AD PRN      Therapy Documentation Precautions:  Precautions Precautions: Fall;Posterior Hip Precaution Comments: Pt is an old CVA affecting his right side (where now he has the hip replacement). Pt has a muscle stimulator that he is using in a study with Dr. Creig Hines Dr. Pearlean Brownie and he said it was fine for the pt to wear this with then new hip replacement since the impulse stays  locally (does not radiate). Restrictions RLE Weight Bearing: Weight bearing as tolerated   ADL: ADL Equipment Provided: Reacher;Long-handled sponge Grooming: Setup Where Assessed-Grooming: Sitting at sink Upper Body Bathing: Minimal assistance Where Assessed-Upper Body Bathing: Shower Lower Body Bathing: Minimal assistance Where Assessed-Lower Body Bathing: Shower Upper Body Dressing: Supervision/safety Where Assessed-Upper Body Dressing: Wheelchair Lower Body Dressing: Maximal assistance Where Assessed-Lower Body Dressing: Wheelchair Toileting: Maximal assistance Where Assessed-Toileting: Teacher, adult education: Maximal Dentist Method: Surveyor, minerals: Raised toilet seat Film/video editor: Maximal cueing Film/video editor Method: Warden/ranger: Shower seat with back  See FIM for current functional status    Richard Kent 01/25/2012, 3:28 PM

## 2012-01-25 NOTE — Progress Notes (Signed)
Occupational Therapy Session Note  Patient Details  Name: Richard Kent MRN: [id number] Date of Birth: 07-19-1928  Today's Date: 01/25/2012 Time: 1400-1430 Time Calculation (min): 30 min  Short Term Goals: Week 1:  OT Short Term Goal 1 (Week 1): Pt. will be supervision with UB bathing OT Short Term Goal 2 (Week 1): Pt. will be stand for LB bathing with mod assist OT Short Term Goal 3 (Week 1): Pt. will donn LB clothes with mod assist OT Short Term Goal 4 (Week 1): Pt. wiil perform 1/3 toileting tasks OT Short Term Goal 5 (Week 1): Pt. will dress LB with mod assist  Skilled Therapeutic Interventions/Progress Updates:    1:1 focus on bed mobility, sit to stands from bed (at height similar to home), toilet with use of grab bar, and w/c with steadying A to supervision with cuing for weight shift and hand placements. Functional ambulation around room with close supervision with cuing for "big step" with right LE. Stood at sink with supervision to shave.  Therapy Documentation Precautions:  Precautions Precautions: Fall;Posterior Hip Precaution Comments: Pt is an old CVA affecting his right side (where now he has the hip replacement). Pt has a muscle stimulator that he is using in a study with Dr. Creig Hines Dr. Pearlean Brownie and he said it was fine for the pt to wear this with then new hip replacement since the impulse stays  locally (does not radiate). Restrictions RLE Weight Bearing: Weight bearing as tolerated Pain:  soreness with some movement  See FIM for current functional status  Therapy/Group: Individual Therapy  Roney Mans [facility] 01/25/2012, 3:03 PM

## 2012-01-25 NOTE — Progress Notes (Signed)
Physical Therapy Session Note  Patient Details  Name: Richard Kent MRN: [id number] Date of Birth: May 31, 1928  Today's Date: 01/25/2012 Time: 3244-0102 Time Calculation (min): 42 min  Short Term Goals: Week 1:  PT Short Term Goal 1 (Week 1): Pt will be able to transfer squat pivot with mod A bed <-> w/c PT Short Term Goal 2 (Week 1): Pt will be able to do sit to stand with mod A PT Short Term Goal 3 (Week 1): Pt will be able to gait with lift equipment/total A+2 x 10' PT Short Term Goal 4 (Week 1): Pt will be able to propel w/c with supervision x 150' on unit  Skilled Therapeutic Interventions/Progress Updates:    Stand pivot to mat with moderate assist practicing positioning of wheelchair and parts management, sequencing for positioning and pre0-stand mechanics. Repeated sit <> stands from slightly elevated mat, basketball shooting without UE support in standing for dynamic balance and standing activity tolerance. Gait training x 150' with RW with min assist. PT to facilitate upright and midline posture, facilitate improved pelvic/trunk rotation. Very limited Rt. Knee and hip flexion during swing, likely premorbid however may be contributing to hip pain.   Therapy Documentation Precautions:  Precautions Precautions: Fall;Posterior Hip Precaution Comments: Pt is an old CVA affecting his right side (where now he has the hip replacement). Pt has a muscle stimulator that he is using in a study with Dr. Creig Hines Dr. Pearlean Brownie and he said it was fine for the pt to wear this with then new hip replacement since the impulse stays  locally (does not radiate). Restrictions RLE Weight Bearing: Weight bearing as tolerated Pain: Pain Assessment Pain Assessment: 0-10 Pain Score:   8 (6-8 during therapy) Pain Type: Surgical pain Pain Location: Hip Pain Orientation: Right Pain Descriptors: Aching Pain Onset: With Activity Pain Intervention(s): Repositioned;RN made aware  See FIM for current  functional status  Therapy/Group: Individual Therapy  [name] 01/25/2012, 6:34 PM

## 2012-01-25 NOTE — Plan of Care (Signed)
Problem: RH PAIN MANAGEMENT Goal: RH STG PAIN MANAGED AT OR BELOW PT'S PAIN GOAL Outcome: Progressing 3/10 goal

## 2012-01-26 ENCOUNTER — Inpatient Hospital Stay (HOSPITAL_COMMUNITY): Payer: Medicare Other

## 2012-01-26 ENCOUNTER — Inpatient Hospital Stay (HOSPITAL_COMMUNITY): Payer: Medicare Other | Admitting: Physical Therapy

## 2012-01-26 DIAGNOSIS — S72009A Fracture of unspecified part of neck of unspecified femur, initial encounter for closed fracture: Secondary | ICD-10-CM

## 2012-01-26 DIAGNOSIS — Z5189 Encounter for other specified aftercare: Secondary | ICD-10-CM

## 2012-01-26 NOTE — Progress Notes (Signed)
Physical Therapy Session Note  Patient Details  Name: Richard Kent MRN: [id number] Date of Birth: 26-Jun-1928  Today's Date: 01/26/2012 Time: 4098-1191 Time Calculation (min): 44 min  Short Term Goals: Week 2:  PT Short Term Goal 1 (Week 2): Pt will perform sit <> stands consistently with moderate assist PT Short Term Goal 2 (Week 2): Pt will ambulate 100' with supervision, RW.  PT Short Term Goal 3 (Week 2): Pt will verbalize 3/3 posterior hip precautions and maintain precautions during therapy session.  PT Short Term Goal 4 (Week 2): Pt will perform stand pivot transfer with moderate assist.   Skilled Therapeutic Interventions/Progress Updates:    Pt limited this afternoon by reports of increased Rt. Hip pain particularly with sit <> stand. Pt unable to stand from wheelchair after repeated attempts even with max assist. Performed scoot pivot transfer wheelchair to mat with supervision. Pt then able to stand from elevated mat with moderate assist. With 120' gait with RW pt demonstrated decreased ability to weight bear through Rt. LE, decreased ability to advance Rt. LE. MD made aware of changes, reports he will order x-ray to check alignment.   Pt continues to have difficulty with instructional carryover however this is gradually improving. Decreased recall/adherence to precautions at times and needs cues.   Therapy Documentation Precautions:  Precautions Precautions: Fall;Posterior Hip Precaution Comments: Pt s/p old CVA affecting his right side (where now he has the hip replacement). Pt has a muscle stimulator that he is using in a study with Dr. Creig Hines Dr. Pearlean Brownie and he said it was fine for the pt to wear this with then new hip replacement since the impulse stays  locally (does not radiate). Restrictions RLE Weight Bearing: Weight bearing as tolerated Pain: Pain Assessment Pain Assessment: 0-10 Pain Score:   7 Pain Type: Surgical pain Pain Location: Hip Pain Orientation:  Right;Anterior;Posterior Pain Descriptors: Aching Pain Frequency: Occasional Pain Onset: Other (Comment) (most painful with sit <> stands) Patients Stated Pain Goal: 2 Pain Intervention(s): Repositioned;RN made aware;MD notified (Comment);Other (Comment) (therapy concerns expressed to MD given nature of pain) Multiple Pain Sites: No  See FIM for current functional status  Therapy/Group: Individual Therapy  [name] 01/26/2012, 4:44 PM

## 2012-01-26 NOTE — Progress Notes (Signed)
Patient ID: Richard Kent, male   DOB: 11/04/1928, 78 y.o.   MRN: [id number]  Subjective/Complaints: A little restless last night. No pain. A 12 point review of systems has been performed and if not noted above is otherwise negative.   Objective: Vital Signs: Blood pressure 136/67, pulse 62, temperature 98.6 F (37 C), temperature source Oral, resp. rate 20, weight 84.3 kg (185 lb 13.6 oz), SpO2 90.00%. Dg Chest 2 View  01/25/2012  *RADIOLOGY REPORT*  Clinical Data: Cough, chest pain  CHEST - 2 VIEW  Comparison: 01/19/2012  Findings: Cardiomediastinal silhouette is stable.  Degenerative changes thoracic spine again noted.  There is slight improvement in aeration.  Residual left basilar retrocardiac infiltrate or atelectasis.  No pulmonary edema. The right lung is clear.  IMPRESSION: Slight improvement in aeration.  Residual left base retrocardiac infiltrate or atelectasis.   Original Report Authenticated By: Natasha Mead, M.D.    Results for orders placed during the hospital encounter of 01/18/12 (from the past 72 hour(s))  GLUCOSE, CAPILLARY     Status: Abnormal   Collection Time   01/23/12  9:16 PM      Component Value Range Comment   Glucose-Capillary 135 (*) 70 - 99 mg/dL    Comment 1 Notify RN        Physical Exam:  General: Alert and oriented x 3, No apparent distress HEENT: Head is normocephalic, atraumatic, PERRLA, EOMI, sclera anicteric, oral mucosa pink and moist, dentition intact, ext ear canals clear,  Neck: Supple without JVD or lymphadenopathy Heart: Reg rate and rhythm. No murmurs rubs or gallops Chest: CTA bilaterally without wheezes, rales, or rhonchi; no distress Abdomen: Soft, non-tender, non-distended, bowel sounds positive. Extremities: No clubbing, cyanosis, or edema. Pulses are 2+ Skin: Clean and intact wound. No drainage. Staples out Neuro: right upper with 1-2 to 2+ strength, resting tone 1-2 and synergy pattern. RLE is 1-2 as well with 2-3/4 resting tone. Occasional  word finding issues but essentially at baseline cogntively with reasonable insight and awareness. Musculoskeletal: Full ROM, No pain with AROM or PROM in the neck, trunk, or extremities. Posture appropriate Psych: Pt's affect is appropriate. Pt is cooperative    Assessment/Plan: 1. Functional deficits secondary to R Transcervical femur fracture with THA.  R spastic hemiparesis due to prior CVA which require 3+ hours per day of interdisciplinary therapy in a comprehensive inpatient rehab setting. Physiatrist is providing close team supervision and 24 hour management of active medical problems listed below. Physiatrist and rehab team continue to assess barriers to discharge/monitor patient progress toward functional and medical goals.     FIM: FIM - Bathing Bathing Steps Patient Completed: Chest;Left Arm;Abdomen;Front perineal area;Right upper leg;Left upper leg;Right lower leg (including foot);Left lower leg (including foot) Bathing: 4: Min-Patient completes 8-9 40f 10 parts or 75+ percent  FIM - Upper Body Dressing/Undressing Upper body dressing/undressing steps patient completed: Thread/unthread right sleeve of pullover shirt/dresss;Thread/unthread left sleeve of pullover shirt/dress;Put head through opening of pull over shirt/dress;Pull shirt over trunk Upper body dressing/undressing: 5: Set-up assist to: Obtain clothing/put away FIM - Lower Body Dressing/Undressing Lower body dressing/undressing steps patient completed: Thread/unthread right pants leg;Thread/unthread left pants leg;Pull pants up/down Lower body dressing/undressing: 2: Max-Patient completed 25-49% of tasks  FIM - Toileting Toileting: 4: Steadying assist  FIM - Diplomatic Services operational officer Devices: Psychiatrist Transfers: 3-To toilet/BSC: Mod A (lift or lower assist)  FIM - Banker Devices: Environmental consultant (with platform) Bed/Chair Transfer: 2: Bed > Chair or  W/C: Max A (lift and lower assist);3: Chair or W/C > Bed: Mod A (lift or lower assist)  FIM - Locomotion: Wheelchair Distance: 150' Locomotion: Wheelchair: 5: Travels 150 ft or more: maneuvers on rugs and over door sills with supervision, cueing or coaxing FIM - Locomotion: Ambulation Locomotion: Ambulation Assistive Devices: Designer, industrial/product Ambulation/Gait Assistance: 4: Min assist Locomotion: Ambulation: 4: Travels 150 ft or more with minimal assistance (Pt.>75%)  Comprehension Comprehension Mode: Auditory Comprehension: 5-Understands complex 90% of the time/Cues < 10% of the time  Expression Expression Mode: Verbal Expression: 5-Expresses complex 90% of the time/cues < 10% of the time  Social Interaction Social Interaction: 6-Interacts appropriately with others with medication or extra time (anti-anxiety, antidepressant).  Problem Solving Problem Solving: 5-Solves basic problems: With no assist  Memory Memory: 5-Recognizes or recalls 90% of the time/requires cueing < 10% of the time  Medical Problem List and Plan:  1. DVT Prophylaxis/Anticoagulation: Pharmaceutical: Lovenox.  2. Pain Management: prn hydrocodone effective  3. Mood: Appropriate without signs of distress. Will monitor for now.  4. Neuropsych: This patient is capable of making decisions on his/her own behalf.  5. [id number]:[id number] Check bid. Off Norvasc and resume lisinopril6. CVA with left hemiparesis: old right frontal and left basal ganglia infarcts in as has worsening of spasticity. May need Zanaflex increased  7. ABLA: will add iron supplement.  8. BLL BMW:[id number] zithromax only now. Afebrile. Recheck cxr looks a little better but persistent ?infiltrate in right base 9. Tone: PRAFO and WHO nightly. LOS (Days) 8 A FACE TO FACE EVALUATION WAS PERFORMED  Richard Kent 01/26/2012, 7:16 AM

## 2012-01-26 NOTE — Progress Notes (Addendum)
Physical Therapy Weekly Progress Note  Patient Details  Name: Richard Kent MRN: [id number] Date of Birth: [birth year]  Today's Date: 01/26/2012 Time: 0700-0800 Time Calculation (min): 60 min  Patient has met 3 of 4 short term goals.  Pt did not meet goals for performing sit <> stands with only moderate assist as pt requires max assist from wheelchair ~30% of the time.   Patient continues to demonstrate the following deficits: increased difficulty and need for assist with transitions sitting <> standing, impaired gait and decreased endurance, impaired balance, decreased safety awareness and therefore will continue to benefit from skilled PT intervention to enhance overall performance with activity tolerance, balance, ability to compensate for deficits and knowledge of precautions.  Patient progressing toward long term goals, updated gait goals secondary to good gait gains..  Continue plan of care.  PT Short Term Goals Week 1:  PT Short Term Goal 1 (Week 1): Pt will be able to transfer squat pivot with mod A bed <-> w/c PT Short Term Goal 1 - Progress (Week 1): Met PT Short Term Goal 2 (Week 1): Pt will be able to do sit to stand with mod A PT Short Term Goal 2 - Progress (Week 1): Not met PT Short Term Goal 3 (Week 1): Pt will be able to gait with lift equipment/total A+2 x 10' PT Short Term Goal 3 - Progress (Week 1): Met PT Short Term Goal 4 (Week 1): Pt will be able to propel w/c with supervision x 150' on unit PT Short Term Goal 4 - Progress (Week 1): Met  Skilled Therapeutic Interventions/Progress Updates:    Sit <> stands performed with moderate assist, improving with carryover for pre-stand positioning and sequencing. Bed mobility with min assist, requires cues for maintaining posterior hip precautions. Supine AAROM/PROM heelslides and hip abduction/adduction within precautions secondary to pt with reports of "stiffness", appears to get very stiff sitting in chair from hypertonicity of  stroke. Car transfer performed with moderate assist, cues for safety, UE placement, and maintaining hip precautions. Gait x 200' with RW, min-guard assist. Tactile/ verbal cues for improved gait mechanics and upright posture. Able to perform scoot pivot with supervision/min assist. Sit <> stands at end of session required max assist, increased posterior lean noted.   Pt reports pain decreases with ambulation.   Therapy Documentation Precautions:  Precautions Precautions: Fall;Posterior Hip Precaution Comments: Pt has an old CVA affecting his right side (where now he has the hip replacement). Pt has a muscle stimulator that he is using in a study with Dr. Creig Hines Dr. Pearlean Brownie and he said it was fine for the pt to wear this with then new hip replacement since the impulse stays  locally (does not radiate). Restrictions RLE Weight Bearing: Weight bearing as tolerated Pain: Pain Assessment Pain Assessment: 0-10 Pain Score:   7 Pain Type: Surgical pain Pain Location: Hip Pain Orientation: Right Pain Descriptors: Aching Pain Frequency: Occasional Pain Onset: Gradual Patients Stated Pain Goal: 2 Pain Intervention(s): Medication (See eMAR) Multiple Pain Sites: No  See FIM for current functional status  Therapy/Group: Individual Therapy  [name] 01/26/2012, 9:49 AM

## 2012-01-26 NOTE — Progress Notes (Signed)
Occupational Therapy Session Note  Patient Details  Name: Richard Kent MRN: [id number] Date of Birth: 24-Nov-1928  Today's Date: 01/26/2012  Session 1 Time: 0700-0800 Time Calculation (min): 60 min  Short Term Goals: Week 2:  OT Short Term Goal 1 (Week 2): Pt. will donn LB clothes with mod assist  OT Short Term Goal 2 (Week 2): Pt will perform 2/3 toileting tasks OT Short Term Goal 3 (Week 2): Pt will perform toilet transfer with mod A using AD PRN OT Short Term Goal 4 (Week 2): Pt will perform tub bench shower transfer with mod A using AD PRN  Skilled Therapeutic Interventions/Progress Updates:    Pt elected to complete bathing tasks at shower level this morning.  Pt required max A for stand pivot transfers w/c<>shower seat but was able to perform sit<>stand in shower using grab bars with min A to bathe buttocks.  Pt continues to exhibit increased proficiency with using reacher to thread pants.  Pt exhibited increased ability to perform sit<>stand at sink with less assistance as session progressed.  Pt continues to require min verbal cues for hand positioning prior to standing.  Focus on sit<>stand, shower transfers, AE use, safety awareness, and activity tolerance.  Therapy Documentation Precautions:  Precautions Precautions: Fall;Posterior Hip Precaution Comments: Pt is an old CVA affecting his right side (where now he has the hip replacement). Pt has a muscle stimulator that he is using in a study with Dr. Creig Hines Dr. Pearlean Brownie and he said it was fine for the pt to wear this with then new hip replacement since the impulse stays  locally (does not radiate). Restrictions RLE Weight Bearing: Weight bearing as tolerated  Pain: Pain Assessment Pain Assessment: 0-10 Pain Score:   6 Pain Type: Surgical pain Pain Location: Hip Pain Orientation: Right Pain Descriptors: Aching;Sore Pain Onset: Gradual Patients Stated Pain Goal: 2 Pain Intervention(s): RN made  aware;Repositioned  See FIM for current functional status  Therapy/Group: Individual Therapy  Session 2 Time: 1400-1430 Pt c/o 6/10 pain in right inguinal area. Pt described pain as muscular.  Pt c/o pain at surgical site when standing.  RN notified. Individual Therapy Pt sitting EOB with wife at bedside.  After assisting pt with donning AFO and shoes practiced sit<>stand from EOB with decreasing surface heights, transfers to w/c with RW, and sit<>stand from w/c.  Pt continues to require mod A with sit<>stand and mod verbal cues for hand placement.  Pt's proficiency with task increased through session but patient has exhibited limited recall from therapy session to session.    Lavone Neri [facility] 01/26/2012, 8:01 AM

## 2012-01-27 ENCOUNTER — Inpatient Hospital Stay (HOSPITAL_COMMUNITY): Payer: Medicare Other | Admitting: *Deleted

## 2012-01-27 NOTE — Progress Notes (Signed)
Patient ID: Richard Kent, male   DOB: 09-Nov-1928, 78 y.o.   MRN: [id number]  Subjective/Complaints: Complained of pain at the end of the therapy day yesterday . Slept well though A 12 point review of systems has been performed and if not noted above is otherwise negative.   Objective: Vital Signs: Blood pressure 122/75, pulse 65, temperature 98.1 F (36.7 C), temperature source Oral, resp. rate 18, weight 84.3 kg (185 lb 13.6 oz), SpO2 95.00%. Dg Chest 2 View  01/25/2012  *RADIOLOGY REPORT*  Clinical Data: Cough, chest pain  CHEST - 2 VIEW  Comparison: 01/19/2012  Findings: Cardiomediastinal silhouette is stable.  Degenerative changes thoracic spine again noted.  There is slight improvement in aeration.  Residual left basilar retrocardiac infiltrate or atelectasis.  No pulmonary edema. The right lung is clear.  IMPRESSION: Slight improvement in aeration.  Residual left base retrocardiac infiltrate or atelectasis.   Original Report Authenticated By: Natasha Mead, M.D.    Dg Hip 1 View Right  01/26/2012  *RADIOLOGY REPORT*  Clinical Data: Increased right hip pain today with therapy.  Recent right hip arthroplasty.  RIGHT HIP - 1 VIEW  Comparison: 01/15/2012 and 01/12/2012.  Findings: There are postoperative changes of right hip arthroplasty.  No evidence of dislocation or subluxation.  The fat sat no acute fracture identified.  The visualized right pubic bones appear intact. Soft tissues are unremarkable.  IMPRESSION: Right hip arthroplasty.  No complicating feature or acute bony abnormality identified.   Original Report Authenticated By: Britta Mccreedy, M.D.    No results found for this or any previous visit (from the past 72 hour(s)).   Physical Exam:  General: Alert and oriented x 3, No apparent distress HEENT: Head is normocephalic, atraumatic, PERRLA, EOMI, sclera anicteric, oral mucosa pink and moist, dentition intact, ext ear canals clear,  Neck: Supple without JVD or lymphadenopathy Heart: Reg  rate and rhythm. No murmurs rubs or gallops Chest: CTA bilaterally without wheezes, rales, or rhonchi; no distress Abdomen: Soft, non-tender, non-distended, bowel sounds positive. Extremities: No clubbing, cyanosis, or edema. Pulses are 2+ Skin: Clean and intact wound. No drainage. Staples out Neuro: right upper with 1-2 to 2+ strength, resting tone 1-2 and synergy pattern. RLE is 1-2 as well with 2-3/4 resting tone. Occasional word finding issues but essentially at baseline cogntively with reasonable insight and awareness. Musculoskeletal:Mild pain at the right HF's and rotators Posture appropriate Psych: Pt's affect is appropriate. Pt is cooperative    Assessment/Plan: 1. Functional deficits secondary to R Transcervical femur fracture with THA.  R spastic hemiparesis due to prior CVA which require 3+ hours per day of interdisciplinary therapy in a comprehensive inpatient rehab setting. Physiatrist is providing close team supervision and 24 hour management of active medical problems listed below. Physiatrist and rehab team continue to assess barriers to discharge/monitor patient progress toward functional and medical goals.     FIM: FIM - Bathing Bathing Steps Patient Completed: Chest;Left Arm;Abdomen;Front perineal area;Right upper leg;Left upper leg;Right lower leg (including foot);Left lower leg (including foot) Bathing: 4: Min-Patient completes 8-9 45f 10 parts or 75+ percent  FIM - Upper Body Dressing/Undressing Upper body dressing/undressing steps patient completed: Thread/unthread right sleeve of pullover shirt/dresss;Thread/unthread left sleeve of pullover shirt/dress;Put head through opening of pull over shirt/dress;Pull shirt over trunk Upper body dressing/undressing: 5: Set-up assist to: Obtain clothing/put away FIM - Lower Body Dressing/Undressing Lower body dressing/undressing steps patient completed: Thread/unthread right pants leg;Thread/unthread left pants leg;Pull pants  up/down;Don/Doff left shoe Lower body dressing/undressing: 2:  Max-Patient completed 25-49% of tasks  FIM - Toileting Toileting steps completed by patient: Performs perineal hygiene Toileting: 2: Max-Patient completed 1 of 3 steps  FIM - Diplomatic Services operational officer Devices: Grab bars Toilet Transfers: 3-To toilet/BSC: Mod A (lift or lower assist);2-From toilet/BSC: Max A (lift and lower assist)  FIM - Press photographer Assistive Devices: Arm rests;Bed rails;HOB elevated Bed/Chair Transfer: 4: Supine > Sit: Min A (steadying Pt. > 75%/lift 1 leg);4: Sit > Supine: Min A (steadying pt. > 75%/lift 1 leg);3: Bed > Chair or W/C: Mod A (lift or lower assist);3: Chair or W/C > Bed: Mod A (lift or lower assist)  FIM - Locomotion: Wheelchair Distance: 200' Locomotion: Wheelchair: 5: Travels 150 ft or more: maneuvers on rugs and over door sills with supervision, cueing or coaxing FIM - Locomotion: Ambulation Locomotion: Ambulation Assistive Devices: Designer, industrial/product Ambulation/Gait Assistance: 4: Min guard Locomotion: Ambulation: 4: Travels 150 ft or more with minimal assistance (Pt.>75%)  Comprehension Comprehension Mode: Auditory Comprehension: 5-Understands complex 90% of the time/Cues < 10% of the time  Expression Expression Mode: Verbal Expression: 6-Expresses complex ideas: With extra time/assistive device  Social Interaction Social Interaction: 6-Interacts appropriately with others with medication or extra time (anti-anxiety, antidepressant).  Problem Solving Problem Solving: 5-Solves complex 90% of the time/cues < 10% of the time  Memory Memory: 5-Requires cues to use assistive device  Medical Problem List and Plan:  1. DVT Prophylaxis/Anticoagulation: Pharmaceutical: Lovenox.  2. Pain Management: prn hydrocodone effective   -xrays negative  -continue current regimen. Utilize heat also for muscle spasms 3. Mood: Appropriate without signs of  distress. Will monitor for now.  4. Neuropsych: This patient is capable of making decisions on his/her own behalf.  5. ZOX:WRUEAVWUJWJX Check bid. Off Norvasc and resume lisinopril6. CVA with left hemiparesis: old right frontal and left basal ganglia infarcts in as has worsening of spasticity. May need Zanaflex increased  7. ABLA: will add iron supplement.  8. BLL BJY:NWGNFAOZ zithromax only now. Afebrile. Recheck cxr looks a little better but persistent ?infiltrate in right base 9. Tone: PRAFO and WHO nightly. LOS (Days) 9 A FACE TO FACE EVALUATION WAS PERFORMED  SWARTZ,ZACHARY T 01/27/2012, 5:03 AM

## 2012-01-27 NOTE — Progress Notes (Signed)
Occupational Therapy Session Note  Patient Details  Name: Richard Kent MRN: [id number] Date of Birth: 07/16/1928  Today's Date: 01/27/2012 Time:  - 1000-1100  (60 min)    Short Term Goals: Week 1:  OT Short Term Goal 1 (Week 1): Pt. will be supervision with UB bathing OT Short Term Goal 1 - Progress (Week 1): Discontinued (comment) (wife assists with UB bathing since stroke) OT Short Term Goal 2 (Week 1): Pt. will be stand for LB bathing with mod assist OT Short Term Goal 2 - Progress (Week 1): Met OT Short Term Goal 3 (Week 1): Pt. will donn LB clothes with mod assist OT Short Term Goal 3 - Progress (Week 1): Progressing toward goal OT Short Term Goal 4 (Week 1): Pt. wiil perform 1/3 toileting tasks OT Short Term Goal 4 - Progress (Week 1): Met OT Short Term Goal 5 (Week 1): Pt. will dress LB with mod assist OT Short Term Goal 5 - Progress (Week 1): Discontinued (comment) (repeat of goal #3) Week 2:  OT Short Term Goal 1 (Week 2): Pt. will donn LB clothes with mod assist  OT Short Term Goal 2 (Week 2): Pt will perform 2/3 toileting tasks OT Short Term Goal 3 (Week 2): Pt will perform toilet transfer with mod A using AD PRN OT Short Term Goal 4 (Week 2): Pt will perform tub bench shower transfer with mod A using AD PRN  Skilled Therapeutic Interventions/Progress Updates:    Pt reports he was able to walk until last night and he could not stand and put weight on the right leg.( x ray was taken and showed no issues.)    Scoot transfer from bed to wc.  Sit to stand at sink for pericare required mod assist to transition and minimal assist for standing balance.  Used reacher to don pants.  Needed verbal cues for hip precautions.  Pt. Recalled 2/3.    Placed shoe buttons in right shoe, but pt unble to don shoe today.  Can benefit from more practice.       Therapy Documentation Precautions:  Precautions Precautions: Fall;Posterior Hip Precaution Comments: Pt is an old CVA affecting his right  side (where now he has the hip replacement). Pt has a muscle stimulator that he is using in a study with Dr. Creig Hines Dr. Pearlean Brownie and he said it was fine for the pt to wear this with then new hip replacement since the impulse stays  locally (does not radiate). Restrictions RLE Weight Bearing: Weight bearing as tolerated    Pain:   7/10 right hip  Exercises:   Other Treatments:    See FIM for current functional status  Therapy/Group: Individual Therapy  Humberto Seals 01/27/2012, 10:34 AM

## 2012-01-28 ENCOUNTER — Inpatient Hospital Stay (HOSPITAL_COMMUNITY): Payer: Medicare Other | Admitting: Physical Therapy

## 2012-01-28 NOTE — Progress Notes (Signed)
Patient ID: Richard Kent, male   DOB: Nov 17, 1928, 78 y.o.   MRN: [id number]  Subjective/Complaints: Pain better. Slept well. A 12 point review of systems has been performed and if not noted above is otherwise negative.   Objective: Vital Signs: Blood pressure 123/69, pulse 60, temperature 98.1 F (36.7 C), temperature source Oral, resp. rate 18, weight 84.3 kg (185 lb 13.6 oz), SpO2 91.00%. Dg Hip 1 View Right  01/26/2012  *RADIOLOGY REPORT*  Clinical Data: Increased right hip pain today with therapy.  Recent right hip arthroplasty.  RIGHT HIP - 1 VIEW  Comparison: 01/15/2012 and 01/12/2012.  Findings: There are postoperative changes of right hip arthroplasty.  No evidence of dislocation or subluxation.  The fat sat no acute fracture identified.  The visualized right pubic bones appear intact. Soft tissues are unremarkable.  IMPRESSION: Right hip arthroplasty.  No complicating feature or acute bony abnormality identified.   Original Report Authenticated By: Britta Mccreedy, M.D.    No results found for this or any previous visit (from the past 72 hour(s)).   Physical Exam:  General: Alert and oriented x 3, No apparent distress HEENT: Head is normocephalic, atraumatic, PERRLA, EOMI, sclera anicteric, oral mucosa pink and moist, dentition intact, ext ear canals clear,  Neck: Supple without JVD or lymphadenopathy Heart: Reg rate and rhythm. No murmurs rubs or gallops Chest: CTA bilaterally without wheezes, rales, or rhonchi; no distress Abdomen: Soft, non-tender, non-distended, bowel sounds positive. Extremities: No clubbing, cyanosis, or edema. Pulses are 2+ Skin: Clean and intact wound. No drainage. Staples out Neuro: right upper with 1-2 to 2+ strength, resting tone 1-2 and synergy pattern. RLE is 1-2 as well with 2-3/4 resting tone. Occasional word finding issues but essentially at baseline cogntively with reasonable insight and awareness. Musculoskeletal:Mild pain at the right HF's and  rotators Posture appropriate Psych: Pt's affect is appropriate. Pt is cooperative    Assessment/Plan: 1. Functional deficits secondary to R Transcervical femur fracture with THA.  R spastic hemiparesis due to prior CVA which require 3+ hours per day of interdisciplinary therapy in a comprehensive inpatient rehab setting. Physiatrist is providing close team supervision and 24 hour management of active medical problems listed below. Physiatrist and rehab team continue to assess barriers to discharge/monitor patient progress toward functional and medical goals.     FIM: FIM - Bathing Bathing Steps Patient Completed: Chest;Left Arm;Abdomen;Front perineal area;Right upper leg;Left upper leg;Right lower leg (including foot);Left lower leg (including foot) Bathing: 4: Min-Patient completes 8-9 59f 10 parts or 75+ percent  FIM - Upper Body Dressing/Undressing Upper body dressing/undressing steps patient completed: Thread/unthread right sleeve of pullover shirt/dresss;Thread/unthread left sleeve of pullover shirt/dress;Put head through opening of pull over shirt/dress;Pull shirt over trunk Upper body dressing/undressing: 5: Set-up assist to: Obtain clothing/put away FIM - Lower Body Dressing/Undressing Lower body dressing/undressing steps patient completed: Thread/unthread right pants leg;Thread/unthread left pants leg;Pull pants up/down;Don/Doff left shoe Lower body dressing/undressing: 4: Min-Patient completed 75 plus % of tasks  FIM - Toileting Toileting steps completed by patient: Performs perineal hygiene Toileting: 2: Max-Patient completed 1 of 3 steps  FIM - Diplomatic Services operational officer Devices: Therapist, music Transfers: 0-Activity did not occur  FIM - Banker Devices: Bed rails Bed/Chair Transfer: 4: Supine > Sit: Min A (steadying Pt. > 75%/lift 1 leg);4: Chair or W/C > Bed: Min A (steadying Pt. > 75%)  FIM - Locomotion:  Wheelchair Distance: 200' Locomotion: Wheelchair: 5: Travels 150 ft or more: maneuvers on  rugs and over door sills with supervision, cueing or coaxing FIM - Locomotion: Ambulation Locomotion: Ambulation Assistive Devices: Walker - Rolling Ambulation/Gait Assistance: 4: Min guard Locomotion: Ambulation: 4: Travels 150 ft or more with minimal assistance (Pt.>75%)  Comprehension Comprehension Mode: Auditory Comprehension: 5-Understands complex 90% of the time/Cues < 10% of the time  Expression Expression Mode: Verbal Expression: 6-Expresses complex ideas: With extra time/assistive device  Social Interaction Social Interaction: 6-Interacts appropriately with others with medication or extra time (anti-anxiety, antidepressant).  Problem Solving Problem Solving: 5-Solves complex 90% of the time/cues < 10% of the time  Memory Memory: 5-Requires cues to use assistive device  Medical Problem List and Plan:  1. DVT Prophylaxis/Anticoagulation: Pharmaceutical: Lovenox.  2. Pain Management: prn hydrocodone effective   -xrays negative, hip feeling better  -continue current regimen. Utilize heat also for muscle spasms 3. Mood: Appropriate without signs of distress. Will monitor for now.  4. Neuropsych: This patient is capable of making decisions on his/her own behalf.  5. ZOX:WRUEAVWUJWJX Check bid. Off Norvasc and resume lisinopril6. CVA with left hemiparesis: old right frontal and left basal ganglia infarcts in as has worsening of spasticity. May need Zanaflex increased  7. ABLA:   iron supplement.  8. BLL BJY:NWGNFAOZ zithromax only now. Afebrile. Recheck cxr looks a little better but persistent ?infiltrate in right base 9. Tone: PRAFO and WHO nightly. LOS (Days) 10 A FACE TO FACE EVALUATION WAS PERFORMED  Windi Toro T 01/28/2012, 6:53 AM

## 2012-01-28 NOTE — Progress Notes (Signed)
Physical Therapy Note  Patient Details  Name: Richard Kent MRN: [id number] Date of Birth: 1928-07-31 Today's Date: 01/28/2012  Time: 1015-1057 42 minutes  Pt c/o 6/10 pain in R hip, RN made aware.  Gait training with close supervision with RW 2 x 50', side and backwards gait with RW, close supervision, cues for sequencing/safety with side and backward steps.  Stair training 2 x 5 stairs with 1 handrail with min A for descending stairs for R LE placement and support.  Sit to stand training at various heights with focus on proper UE and LE placement and pushing through B LEs.  Pt with decreased pain and increased activity tolerance vs last sessions.  Individual therapy   Monty Mccarrell 01/28/2012, 10:58 AM

## 2012-01-29 ENCOUNTER — Inpatient Hospital Stay (HOSPITAL_COMMUNITY): Payer: Medicare Other

## 2012-01-29 ENCOUNTER — Inpatient Hospital Stay (HOSPITAL_COMMUNITY): Payer: Medicare Other | Admitting: Physical Therapy

## 2012-01-29 DIAGNOSIS — Z5189 Encounter for other specified aftercare: Secondary | ICD-10-CM

## 2012-01-29 DIAGNOSIS — S72009A Fracture of unspecified part of neck of unspecified femur, initial encounter for closed fracture: Secondary | ICD-10-CM

## 2012-01-29 NOTE — Progress Notes (Signed)
Occupational Therapy Session Note  Patient Details  Name: Richard Kent MRN: [id number] Date of Birth: [birth year]  Today's Date: 01/29/2012  Session 1 Time: 0700-0755 Time Calculation (min): 55 min  Short Term Goals: Week 2:  OT Short Term Goal 1 (Week 2): Pt. will donn LB clothes with mod assist  OT Short Term Goal 2 (Week 2): Pt will perform 2/3 toileting tasks OT Short Term Goal 3 (Week 2): Pt will perform toilet transfer with mod A using AD PRN OT Short Term Goal 4 (Week 2): Pt will perform tub bench shower transfer with mod A using AD PRN  Skilled Therapeutic Interventions/Progress Updates:    Pt in bed but performed supine>sit as soon as therapist entered room in preparation for bathing at walk-in shower level.  Pt required max A for squat pivot transfer to w/c and mod A for stand pivot transfer for w/c><shower seat.  Pt used long handle sponge to bathe BLE but continues to require assistance bathing buttocks when standing.  Pt's sit>stand improved with repetition throughout the session but continues to requires verbal cues for BUE positioning and for forward leans prior to standing.  Pt able to pull up pants this morning after BLE properly placed.  Focus on transfers, sit<>stand and standing balance.  Therapy Documentation Precautions:  Precautions Precautions: Fall;Posterior Hip Precaution Comments: Pt is an old CVA affecting his right side (where now he has the hip replacement). Pt has a muscle stimulator that he is using in a study with Dr. Creig Hines Dr. Pearlean Brownie and he said it was fine for the pt to wear this with then new hip replacement since the impulse stays  locally (does not radiate). Restrictions Weight Bearing Restrictions: Yes RLE Weight Bearing: Weight bearing as tolerated (hip precautions)  Pain: Pain Assessment Pain Assessment: 0-10 Pain Score:   6 Pain Type: Acute pain Pain Location: Hip Pain Orientation: Right Pain Descriptors: Aching;Sore Pain Onset:  On-going Patients Stated Pain Goal: 2 Pain Intervention(s): RN made aware;Repositioned  See FIM for current functional status  Therapy/Group: Individual Therapy  Session 2 Time: 1345-1430 Pt c/o increased pain in Rt groin/inguinal area with initial standing attempts.  Pt also c/o pain in upper quads on RLE with initial attempts.  First three attempts to stand were unsuccessful and pt unable to fully extend BLEs.  After stretching Rt quads and Rt knee pt able to stand with mod A.  Pt engaged in bed transfers, toilet transfers, and toileting.  Pt practiced sit<>stand X 5 during session with emphasis on repetition of techniques.  Pt completed toilet transfer with steady A, ambulating with RW approx 25'.  Focus on sit<>stand, safety awareness, and transfers.  Lavone Neri [facility] 01/29/2012, 7:57 AM

## 2012-01-29 NOTE — Progress Notes (Signed)
Physical Therapy Session Note  Patient Details  Name: Richard Kent MRN: [id number] Date of Birth: 1929-01-08  Today's Date: 01/29/2012 Time: 4098-1191 Time Calculation (min): 35 min  Short Term Goals: Week 2:  PT Short Term Goal 1 (Week 2): Pt will perform sit <> stands consistently with moderate assist PT Short Term Goal 2 (Week 2): Pt will ambulate 100' with supervision, RW.  PT Short Term Goal 3 (Week 2): Pt will verbalize 3/3 posterior hip precautions and maintain precautions during therapy session.  PT Short Term Goal 4 (Week 2): Pt will perform stand pivot transfer with moderate assist.   Skilled Therapeutic Interventions/Progress Updates:  Sit <> stands from wheelchair performed at moderate assist, improved consistency with need for assist. Pt requires facilitation of anterior weight shift during transition (limited with anterior trunk lean prior to standing secondary to precautions.) Gait x 130' with RW, min assist to facilitate upright posture, hip extension, Rt. pelvic rotation. With fatigue pt's Rt. UE fatigues and he pushes the device at a skewed angle. Performed 120'  trial of hemi walker with min assist  however this device exacerbates pt's postural deficits (Lt. Trunk lean, Rt. Shoulder/trunk/pelvic retraction.) Will continue to assess if hemi-walker verses RW most safe for pt.   Wife present for session- educated on how to assist with sit <> stands. Wife assisted pt to stand x 1 rep with min assist from PT. Will need further practice.    Discussed with wife, pt that pt may not be safe to perform 14 steps sequentially once first home. Wife reports pt will not have bed on first floor - discussed potential for hospital bed until pt able to safely perform steps with HHPT.  Therapy Documentation Precautions:  Precautions Precautions: Fall;Posterior Hip Precaution Comments: Pt s/p old CVA affecting his right side (where now he has the hip replacement). Pt has a muscle stimulator  that he is using in a study with Dr. Creig Hines Dr. Pearlean Brownie and he said it was fine for the pt to wear this with then new hip replacement since the impulse stays  locally (does not radiate). Restrictions Weight Bearing Restrictions: Yes RLE Weight Bearing: Weight bearing as tolerated (hip precautions) Pain: Pain Assessment Pain Assessment: 0-10 Pain Score:   7 Pain Type: Surgical pain Pain Location: Hip Pain Orientation: Right Pain Descriptors: Aching Pain Onset: With Activity Pain Intervention(s): Repositioned;Ambulation/increased activity Locomotion : Ambulation Ambulation/Gait Assistance: 4: Min assist Wheelchair Mobility Distance: 150'   See FIM for current functional status  Therapy/Group: Individual Therapy  [name] 01/29/2012, 5:23 PM

## 2012-01-29 NOTE — Progress Notes (Signed)
Physical Therapy Session Note  Patient Details  Name: Richard Kent MRN: [id number] Date of Birth: 14-May-1929  Today's Date: 01/29/2012 Time: 1103-1201 Time Calculation (min): 58 min  Short Term Goals: Week 1:  PT Short Term Goal 1 (Week 1): Pt will be able to transfer squat pivot with mod A bed <-> w/c PT Short Term Goal 1 - Progress (Week 1): Met PT Short Term Goal 2 (Week 1): Pt will be able to do sit to stand with mod A PT Short Term Goal 2 - Progress (Week 1): Not met PT Short Term Goal 3 (Week 1): Pt will be able to gait with lift equipment/total A+2 x 10' PT Short Term Goal 3 - Progress (Week 1): Met PT Short Term Goal 4 (Week 1): Pt will be able to propel w/c with supervision x 150' on unit PT Short Term Goal 4 - Progress (Week 1): Met  Skilled Therapeutic Interventions/Progress Updates:    Improved sit <> stand mechanics today however at times is unable to perform transition (? Apraxic from stroke). Gait training in home environment with RW (side stepping/back walking) over carpeted surfaces. Bed mobility with close supervision, cues needed for maintaining precautions, increased time. Obstacle course: stepping over obstacle, negotiating RW through obstacle, and steps with one rail all performed with up to min assist. Full course performed x 4 reps, most difficulty with stepping over obstacle and preventing hip internal rotation. With steps pt has Rt. LE internal rotation tone during descent, cued to rotate pelvis to Lt. To compensate and maintain precautions, decreased retention of cues however. Repeated sit <> stands from slightly elevated mat, performed with min-guard assist verbal cues for anterior weight shift.   Pt able to verbalize 2/3 hip precautions mod verbal cues for last precaution. Decreased retention of instructional cues throughout.  Therapy Documentation Precautions:  Precautions Precautions: Fall;Posterior Hip Precaution Comments: Pt s/p old CVA affecting his right  side (where now he has the hip replacement). Pt has a muscle stimulator that he is using in a study with Dr. Creig Hines Dr. Pearlean Brownie and he said it was fine for the pt to wear this with then new hip replacement since the impulse stays  locally (does not radiate). Restrictions Weight Bearing Restrictions: Yes RLE Weight Bearing: Weight bearing as tolerated (hip precautions) Pain: Pain Assessment Pain Assessment: 0-10 Pain Score:   5 Pain Type: Surgical pain Pain Location: Hip Pain Orientation: Right Pain Descriptors: Aching;Sore Pain Onset: On-going Pain Intervention(s): Repositioned;Other (Comment) (Rest as needed) Locomotion : Ambulation Ambulation/Gait Assistance: 4: Min guard Wheelchair Mobility Distance: 200'   See FIM for current functional status  Therapy/Group: Individual Therapy  [name] 01/29/2012, 12:05 PM

## 2012-01-29 NOTE — Plan of Care (Signed)
Problem: RH SKIN INTEGRITY Goal: RH STG ABLE TO PERFORM INCISION/WOUND CARE W/ASSISTANCE STG Able To Perform Incision/Wound Care With min Assistance.  Outcome: Progressing microguard powder to buttocks daily and prn toileting

## 2012-01-29 NOTE — Progress Notes (Signed)
Patient ID: Richard Kent, male   DOB: 1929/01/27, 78 y.o.   MRN: [id number]  Subjective/Complaints: Pain better. Slept well. A 12 point review of systems has been performed and if not noted above is otherwise negative.   Objective: Vital Signs: Blood pressure 117/59, pulse 61, temperature 98.3 F (36.8 C), temperature source Oral, resp. rate 20, weight 84.3 kg (185 lb 13.6 oz), SpO2 94.00%. No results found. No results found for this or any previous visit (from the past 72 hour(s)).   Physical Exam:  General: Alert and oriented x 3, No apparent distress HEENT: Head is normocephalic, atraumatic, PERRLA, EOMI, sclera anicteric, oral mucosa pink and moist, dentition intact, ext ear canals clear,  Neck: Supple without JVD or lymphadenopathy Heart: Reg rate and rhythm. No murmurs rubs or gallops Chest: CTA bilaterally without wheezes, rales, or rhonchi; no distress Abdomen: Soft, non-tender, non-distended, bowel sounds positive. Extremities: No clubbing, cyanosis, or edema. Pulses are 2+ Skin: Clean and intact wound. No drainage. Staples out Neuro: right upper with 1-2 to 2+ strength, resting tone 1-2 and synergy pattern. RLE is 1-2 as well with 2-3/4 resting tone. Occasional word finding issues but essentially at baseline cogntively with reasonable insight and awareness. Musculoskeletal:Mild pain at the right HF's and rotators Posture appropriate Psych: Pt's affect is appropriate. Pt is cooperative    Assessment/Plan: 1. Functional deficits secondary to R Transcervical femur fracture with THA.  R spastic hemiparesis due to prior CVA which require 3+ hours per day of interdisciplinary therapy in a comprehensive inpatient rehab setting. Physiatrist is providing close team supervision and 24 hour management of active medical problems listed below. Physiatrist and rehab team continue to assess barriers to discharge/monitor patient progress toward functional and medical  goals.     FIM: FIM - Bathing Bathing Steps Patient Completed: Chest;Right Arm;Abdomen;Front perineal area;Right upper leg;Left upper leg;Left lower leg (including foot);Right lower leg (including foot) Bathing: 4: Min-Patient completes 8-9 31f 10 parts or 75+ percent  FIM - Upper Body Dressing/Undressing Upper body dressing/undressing steps patient completed: Thread/unthread right sleeve of pullover shirt/dresss;Thread/unthread left sleeve of pullover shirt/dress;Put head through opening of pull over shirt/dress;Pull shirt over trunk Upper body dressing/undressing: 5: Set-up assist to: Obtain clothing/put away FIM - Lower Body Dressing/Undressing Lower body dressing/undressing steps patient completed: Thread/unthread right pants leg;Thread/unthread left pants leg;Pull pants up/down;Don/Doff left shoe Lower body dressing/undressing: 3: Mod-Patient completed 50-74% of tasks  FIM - Toileting Toileting steps completed by patient: Performs perineal hygiene Toileting: 2: Max-Patient completed 1 of 3 steps  FIM - Diplomatic Services operational officer Devices: Grab bars Toilet Transfers: 3-To toilet/BSC: Mod A (lift or lower assist)  FIM - Banker Devices: Bed rails;HOB elevated Bed/Chair Transfer: 5: Supine > Sit: Supervision (verbal cues/safety issues);3: Bed > Chair or W/C: Mod A (lift or lower assist)  FIM - Locomotion: Wheelchair Distance: 200' Locomotion: Wheelchair: 5: Travels 150 ft or more: maneuvers on rugs and over door sills with supervision, cueing or coaxing FIM - Locomotion: Ambulation Locomotion: Ambulation Assistive Devices: Designer, industrial/product Ambulation/Gait Assistance: 4: Min guard Locomotion: Ambulation: 4: Travels 150 ft or more with minimal assistance (Pt.>75%)  Comprehension Comprehension Mode: Auditory Comprehension: 5-Follows basic conversation/direction: With extra time/assistive device  Expression Expression  Mode: Verbal Expression: 5-Expresses basic needs/ideas: With extra time/assistive device  Social Interaction Social Interaction: 6-Interacts appropriately with others with medication or extra time (anti-anxiety, antidepressant).  Problem Solving Problem Solving: 5-Solves basic 90% of the time/requires cueing < 10% of the time  Memory Memory: 5-Recognizes or recalls 90% of the time/requires cueing < 10% of the time  Medical Problem List and Plan:  1. DVT Prophylaxis/Anticoagulation: Pharmaceutical: Lovenox.  2. Pain Management: prn hydrocodone effective   -xrays negative, hip feeling better  -continue current regimen. Utilize heat also for muscle spasms 3. Mood: Appropriate without signs of distress. Will monitor for now.  4. Neuropsych: This patient is capable of making decisions on his/her own behalf.  5. ION:[id number] Check bid. Off Norvasc and resume lisinopril6. CVA with left hemiparesis: old right frontal and left basal ganglia infarcts in as has worsening of spasticity. May need Zanaflex increased  7. ABLA:   iron supplement.  8. BLL WNU:UVOZDGUY zithromax through 9/12 then dc 9. Tone: PRAFO and WHO nightly. LOS (Days) 11 A FACE TO FACE EVALUATION WAS PERFORMED  SWARTZ,ZACHARY T 01/29/2012, 7:56 AM

## 2012-01-30 ENCOUNTER — Inpatient Hospital Stay (HOSPITAL_COMMUNITY): Payer: Medicare Other

## 2012-01-30 ENCOUNTER — Inpatient Hospital Stay (HOSPITAL_COMMUNITY): Payer: Medicare Other | Admitting: Physical Therapy

## 2012-01-30 DIAGNOSIS — S72009A Fracture of unspecified part of neck of unspecified femur, initial encounter for closed fracture: Secondary | ICD-10-CM

## 2012-01-30 DIAGNOSIS — Z5189 Encounter for other specified aftercare: Secondary | ICD-10-CM

## 2012-01-30 NOTE — Progress Notes (Signed)
Occupational Therapy Session Note  Patient Details  Name: Richard Kent MRN: [id number] Date of Birth: 1928/06/18  Today's Date: 01/30/2012  Session 1 Time: 0700-0754 Time Calculation (min): 54 min  Short Term Goals: Week 2:  OT Short Term Goal 1 (Week 2): Pt. will donn LB clothes with mod assist  OT Short Term Goal 2 (Week 2): Pt will perform 2/3 toileting tasks OT Short Term Goal 3 (Week 2): Pt will perform toilet transfer with mod A using AD PRN OT Short Term Goal 4 (Week 2): Pt will perform tub bench shower transfer with mod A using AD PRN  Skilled Therapeutic Interventions/Progress Updates:    Pt amb with RW to bathroom to complete bathing tasks at walk-in shower level.  Pt continues to require verbal cues for BLE positioning prior to standing from shower seat.  Pt exhibits posterior lean when standing and requires tactile and verbal cues to stand upright.  When standing in shower pt unable to release LUE from grab bar to bathe buttocks.  When standing at sink and BLE correctly placed for balance pt exhibits ability to pull up pants with steady assist.  Pt's ability to perform sit<>stand fluctuates between min A and max A with more difficulty with lower surfaces.  Focus on sit<>stand, standing balance, and safety awareness Therapy Documentation Precautions:  Precautions Precautions: Fall;Posterior Hip Precaution Comments: Pt is an old CVA affecting his right side (where now he has the hip replacement). Pt has a muscle stimulator that he is using in a study with Dr. Creig Hines Dr. Pearlean Brownie and he said it was fine for the pt to wear this with then new hip replacement since the impulse stays  locally (does not radiate). Restrictions Weight Bearing Restrictions: Yes RLE Weight Bearing: Weight bearing as tolerated (hip precautions) General:   Pain: Pain Assessment Pain Assessment: 0-10 Pain Score:   5 Pain Type: Acute pain Pain Location: Hip Pain Orientation: Right Pain  Descriptors: Aching Pain Onset: With Activity Patients Stated Pain Goal: 2 Pain Intervention(s): Repositioned;RN made aware  See FIM for current functional status  Therapy/Group: Individual Therapy  Session 2 Time: 1308-6578 Individual therapy Focus of session on sit<>stand from w/c, bed, tub transfer bench, and edge of mat.  Pt's ability to perform tasks fluctuated between steady A and max A.   Pt continues to require verbal cues for correct body positioning and UE positioning prior to standing.  Pt continues to occasionally exhibit posterior lean when standing but continues to attempt to stand unsuccessfully and requires verbal cues to sit down and start task over.    Lavone Neri [facility] 01/30/2012, 7:55 AM

## 2012-01-30 NOTE — Patient Care Conference (Signed)
Inpatient RehabilitationTeam Conference Note Date: 01/30/2012   Time: 2:55 PM    Patient Name: Richard Kent      Medical Record Number: [id number]  Date of Birth: 1928/12/02 Sex: Male         Room/Bed: 4039/4039-01 Payor Info: Payor: MEDICARE  Plan: MEDICARE PART A AND B  Product Type: *No Product type*     Admitting Diagnosis: r fnf,old left cva  Admit Date/Time:  01/18/2012  4:05 PM Admission Comments: No comment available   Primary Diagnosis:  Hemiplegia affecting unspecified side, late effect of cerebrovascular disease Principal Problem: Hemiplegia affecting unspecified side, late effect of cerebrovascular disease  Patient Active Problem List   Diagnosis Date Noted  . CAP (community acquired pneumonia) 01/14/2012  . Carotid stenosis, bilateral 01/14/2012  . Hypoxia 01/13/2012  . RBBB 01/13/2012  . Fracture of femoral neck, right 01/12/2012  . Hypertension 01/12/2012  . Carotid artery calcification 01/12/2012  . DYSLIPIDEMIA 12/17/2009  . CVA WITH LEFT HEMIPARESIS 12/17/2009  . RBBB 10/21/2009    Expected Discharge Date: Expected Discharge Date: 02/09/12  Team Members Present: Physician: Dr. Faith Rogue Social Worker Present: Amada Jupiter, LCSW Nurse Present: Carmie End, RN PT Present: Reggy Eye, PT OT Present: Ardis Rowan, COTA Other (Discipline and Name): Tora Duck,  PPS Coordinator     Current Status/Progress Goal Weekly Team Focus  Medical   right hip pain improved. baseline apraxia  no changes  see prior   Bowel/Bladder   continent of bladder/bowel managed with miralax daily  n/a  n/a   Swallow/Nutrition/ Hydration             ADL's   min A UB ADL and bathing; mod A LB dressing; mod/max A sit<>stand; min A toilet transfers  min A overall  sit<>stand, standing balance, transfers, activity tolerance    Mobility   Moderate assist sit > stand, min/supervision for ambulation.   Supervision for transfers (min assist for car).   Decrease need for assist  with transfers, increase standing tolerance, improve sit <> stand transition, promote safety with gait.    Communication             Safety/Cognition/ Behavioral Observations            Pain   controlled with vicodin prn  pain managed with prm medication  assess effectiveness of medications   Skin   microguard powder to buttocks/incontinence dermatitis  healing area  monitor effectiveness of treatments    Rehab Goals Patient on target to meet rehab goals: Yes *See Interdisciplinary Assessment and Plan and progress notes for long and short-term goals  Barriers to Discharge: se prio    Possible Resolutions to Barriers:  see prior    Discharge Planning/Teaching Needs:  Home with wife and daughter to provide 24/7 assistance      Team Discussion:  Good progress, however, transitional movement continues to be most difficult and variable in assistance needs at any given time.  Wife has begun family education.  Slight decrease in hip pain.  Revisions to Treatment Plan:  None   Continued Need for Acute Rehabilitation Level of Care: The patient requires daily medical management by a physician with specialized training in physical medicine and rehabilitation for the following conditions: Daily direction of a multidisciplinary physical rehabilitation program to ensure safe treatment while eliciting the highest outcome that is of practical value to the patient.: Yes Daily medical management of patient stability for increased activity during participation in an intensive rehabilitation regime.: Yes Daily analysis  of laboratory values and/or radiology reports with any subsequent need for medication adjustment of medical intervention for : Post surgical problems;Neurological problems;Other  Flem Enderle 01/30/2012, 5:54 PM

## 2012-01-30 NOTE — Progress Notes (Signed)
Physical Therapy Session Note  Patient Details  Name: Richard Kent MRN: [id number] Date of Birth: Oct 12, 1928  Today's Date: 01/30/2012 Time: 1100-1130 Time Calculation (min): 30 min   Skilled Therapeutic Interventions/Progress Updates:   Treatment focused on dynamic gait training with RW through obstacles, side stepping and turns with min A/close supervision. Sit to stand training with emphasis on technique and anterior lean initially requiring mod A progressing to min A with repetition. W/c propulsion on unit to/from therapy mod I   Therapy Documentation Precautions:  Precautions Precautions: Fall;Posterior Hip Precaution Comments: Pt is an old CVA affecting his right side (where now he has the hip replacement). Pt has a muscle stimulator that he is using in a study with Dr. Creig Hines Dr. Pearlean Brownie and he said it was fine for the pt to wear this with then new hip replacement since the impulse stays  locally (does not radiate). Restrictions Weight Bearing Restrictions: Yes RLE Weight Bearing: Weight bearing as tolerated (hip precautions) Pain: Reports some pain in R hip - premedicated.   See FIM for current functional status  Therapy/Group: Individual Therapy  Karolee Stamps [facility] 01/30/2012, 12:03 PM

## 2012-01-30 NOTE — Progress Notes (Signed)
Patient ID: Richard Kent, male   DOB: 1929/01/13, 78 y.o.   MRN: [id number]  Subjective/Complaints: No complaints. In good spirits as usual. A 12 point review of systems has been performed and if not noted above is otherwise negative.   Objective: Vital Signs: Blood pressure 130/57, pulse 54, temperature 98.1 F (36.7 C), temperature source Oral, resp. rate 16, weight 84.3 kg (185 lb 13.6 oz), SpO2 95.00%. No results found. No results found for this or any previous visit (from the past 72 hour(s)).   Physical Exam:  General: Alert and oriented x 3, No apparent distress HEENT: Head is normocephalic, atraumatic, PERRLA, EOMI, sclera anicteric, oral mucosa pink and moist, dentition intact, ext ear canals clear,  Neck: Supple without JVD or lymphadenopathy Heart: Reg rate and rhythm. No murmurs rubs or gallops Chest: CTA bilaterally without wheezes, rales, or rhonchi; no distress Abdomen: Soft, non-tender, non-distended, bowel sounds positive. Extremities: No clubbing, cyanosis, or edema. Pulses are 2+ Skin: Clean and intact wound. No drainage. Staples out Neuro: right upper with 1-2 to 2+ strength, resting tone 1-2 and synergy pattern. RLE is 1-2 as well with 2-3/4 resting tone. Occasional word finding issues but essentially at baseline cogntively with reasonable insight and awareness. Musculoskeletal:Mild pain at the right HF's and rotators appears improved. Posture appropriate Psych: Pt's affect is appropriate. Pt is cooperative    Assessment/Plan: 1. Functional deficits secondary to R Transcervical femur fracture with THA.  R spastic hemiparesis due to prior CVA which require 3+ hours per day of interdisciplinary therapy in a comprehensive inpatient rehab setting. Physiatrist is providing close team supervision and 24 hour management of active medical problems listed below. Physiatrist and rehab team continue to assess barriers to discharge/monitor patient progress toward functional  and medical goals.     FIM: FIM - Bathing Bathing Steps Patient Completed: Chest;Right Arm;Abdomen;Front perineal area;Right upper leg;Left upper leg;Left lower leg (including foot);Right lower leg (including foot) Bathing: 4: Min-Patient completes 8-9 65f 10 parts or 75+ percent  FIM - Upper Body Dressing/Undressing Upper body dressing/undressing steps patient completed: Thread/unthread right sleeve of pullover shirt/dresss;Thread/unthread left sleeve of pullover shirt/dress;Put head through opening of pull over shirt/dress;Pull shirt over trunk Upper body dressing/undressing: 5: Set-up assist to: Obtain clothing/put away FIM - Lower Body Dressing/Undressing Lower body dressing/undressing steps patient completed: Thread/unthread right pants leg;Thread/unthread left pants leg;Pull pants up/down;Don/Doff left shoe Lower body dressing/undressing: 3: Mod-Patient completed 50-74% of tasks  FIM - Toileting Toileting steps completed by patient: Adjust clothing prior to toileting;Performs perineal hygiene;Adjust clothing after toileting Toileting: 4: Steadying assist  FIM - Diplomatic Services operational officer Devices: Environmental consultant;Bedside commode;Elevated toilet seat Toilet Transfers: 4-To toilet/BSC: Min A (steadying Pt. > 75%);4-From toilet/BSC: Min A (steadying Pt. > 75%)  FIM - Bed/Chair Transfer Bed/Chair Transfer Assistive Devices: Therapist, occupational: 5: Supine > Sit: Supervision (verbal cues/safety issues);5: Sit > Supine: Supervision (verbal cues/safety issues);4: Bed > Chair or W/C: Min A (steadying Pt. > 75%);4: Chair or W/C > Bed: Min A (steadying Pt. > 75%)  FIM - Locomotion: Wheelchair Distance: 150' Locomotion: Wheelchair: 5: Travels 150 ft or more: maneuvers on rugs and over door sills with supervision, cueing or coaxing FIM - Locomotion: Ambulation Locomotion: Ambulation Assistive Devices: Walker - Rolling;Walker - Hemi Ambulation/Gait Assistance: 4: Min  assist Locomotion: Ambulation: 2: Travels 50 - 149 ft with minimal assistance (Pt.>75%)  Comprehension Comprehension Mode: Auditory Comprehension: 5-Follows basic conversation/direction: With extra time/assistive device  Expression Expression Mode: Verbal Expression: 5-Expresses basic needs/ideas: With extra  time/assistive device  Social Interaction Social Interaction: 6-Interacts appropriately with others with medication or extra time (anti-anxiety, antidepressant).  Problem Solving Problem Solving: 5-Solves basic problems: With no assist  Memory Memory: 5-Recognizes or recalls 90% of the time/requires cueing < 10% of the time  Medical Problem List and Plan:  1. DVT Prophylaxis/Anticoagulation: Pharmaceutical: Lovenox.  2. Pain Management: prn hydrocodone effective   -continue current regimen. Utilize heat also for muscle spasms 3. Mood: Appropriate without signs of distress. Will monitor for now.  4. Neuropsych: This patient is capable of making decisions on his/her own behalf.  5. ZOX:[id number] Check bid. Off Norvasc and resume lisinopril6. CVA with left hemiparesis: old right frontal and left basal ganglia infarcts in as has worsening of spasticity. May need Zanaflex increased  7. ABLA:   iron supplement.  8. BLL BJY:NWGNFAOZ zithromax through 9/12 then dc  -no cough. afebrile 9. Tone: PRAFO and WHO nightly. LOS (Days) 12 A FACE TO FACE EVALUATION WAS PERFORMED  SWARTZ,ZACHARY T 01/30/2012, 7:23 AM

## 2012-01-30 NOTE — Progress Notes (Signed)
Physical Therapy Session Note  Patient Details  Name: Richard Kent MRN: [id number] Date of Birth: July 18, 1928  Today's Date: 01/30/2012 Time: 0700-0754 Time Calculation (min): 54 min  Short Term Goals: Week 3:     Skilled Therapeutic Interventions/Progress Updates:    Sit <> stands from wheelchair reaching for ball with Rt. UE once buttocks lifted from wheelchair for anterior weight shift moderate to min assist. Gait training x 140', 210'with RW, cues and facilitation for straight path, posture, and keeping RW straight. Car transfer from slightly elevated car seat x 2 reps with moderate assist. (? Pt may benefit from elevated cushion in car to provide height to low car seat). Verbal cues needed throughout for safety both with transfers and wheelchair parts management (such as forgetting to set brakes) demonstrating need for pt to have supervision with all mobility at this point.  Pt still only able to verbally recall 2/3 hip precautions although reviewed each session.   Therapy Documentation Precautions:  Precautions Precautions: Fall;Posterior Hip Precaution Comments: Pt s/p old CVA affecting his right side (where now he has the hip replacement). Pt has a muscle stimulator that he is using in a study with Dr. Creig Hines Dr. Pearlean Brownie and he said it was fine for the pt to wear this with then new hip replacement since the impulse stays  locally (does not radiate). Restrictions Weight Bearing Restrictions: Yes RLE Weight Bearing: Weight bearing as tolerated (hip precautions) Pain: Pain Assessment Pain Assessment: 0-10 Pain Score:   7 Pain Type: Surgical pain Pain Location: Hip Pain Orientation: Right Pain Descriptors: Aching Pain Onset: With Activity Patients Stated Pain Goal: 2 Pain Intervention(s): Repositioned;Other (Comment) (pt desired pain medication at end of treatment)   See FIM for current functional status  Therapy/Group: Individual Therapy  [name] 01/30/2012, 8:53 AM

## 2012-01-31 ENCOUNTER — Inpatient Hospital Stay (HOSPITAL_COMMUNITY): Payer: Medicare Other | Admitting: *Deleted

## 2012-01-31 ENCOUNTER — Inpatient Hospital Stay (HOSPITAL_COMMUNITY): Payer: Medicare Other

## 2012-01-31 ENCOUNTER — Inpatient Hospital Stay (HOSPITAL_COMMUNITY): Payer: Medicare Other | Admitting: Physical Therapy

## 2012-01-31 DIAGNOSIS — Z5189 Encounter for other specified aftercare: Secondary | ICD-10-CM

## 2012-01-31 DIAGNOSIS — S72009A Fracture of unspecified part of neck of unspecified femur, initial encounter for closed fracture: Secondary | ICD-10-CM

## 2012-01-31 NOTE — Progress Notes (Signed)
Patient ID: Richard Kent, male   DOB: 1929-04-27, 78 y.o.   MRN: [id number]  Subjective/Complaints: No complaints. Needs to go the bathroom. Occasional right hip pain. A 12 point review of systems has been performed and if not noted above is otherwise negative.   Objective: Vital Signs: Blood pressure 145/61, pulse 54, temperature 97.7 F (36.5 C), temperature source Oral, resp. rate 18, weight 84.3 kg (185 lb 13.6 oz), SpO2 97.00%. No results found. No results found for this or any previous visit (from the past 72 hour(s)).   Physical Exam:  General: Alert and oriented x 3, No apparent distress HEENT: Head is normocephalic, atraumatic, PERRLA, EOMI, sclera anicteric, oral mucosa pink and moist, dentition intact, ext ear canals clear,  Neck: Supple without JVD or lymphadenopathy Heart: Reg rate and rhythm. No murmurs rubs or gallops Chest: CTA bilaterally without wheezes, rales, or rhonchi; no distress Abdomen: Soft, non-tender, non-distended, bowel sounds positive. Extremities: No clubbing, cyanosis, or edema. Pulses are 2+ Skin: Clean and intact wound. No drainage. Staples out Neuro: right upper with 1-2 to 2+ strength, resting tone 1-2 and synergy pattern. RLE is 1-2 as well with 2-3/4 resting tone. Occasional word finding issues but essentially at baseline cogntively with reasonable insight and awareness. Musculoskeletal:Mild pain at the right HF's and rotators appears improved. Posture appropriate Psych: Pt's affect is appropriate. Pt is cooperative    Assessment/Plan: 1. Functional deficits secondary to R Transcervical femur fracture with THA.  R spastic hemiparesis due to prior CVA which require 3+ hours per day of interdisciplinary therapy in a comprehensive inpatient rehab setting. Physiatrist is providing close team supervision and 24 hour management of active medical problems listed below. Physiatrist and rehab team continue to assess barriers to discharge/monitor patient  progress toward functional and medical goals.     FIM: FIM - Bathing Bathing Steps Patient Completed: Chest;Right Arm;Abdomen;Front perineal area;Right upper leg;Left upper leg;Left lower leg (including foot);Right lower leg (including foot) Bathing: 4: Min-Patient completes 8-9 53f 10 parts or 75+ percent  FIM - Upper Body Dressing/Undressing Upper body dressing/undressing steps patient completed: Thread/unthread right sleeve of pullover shirt/dresss;Thread/unthread left sleeve of pullover shirt/dress;Put head through opening of pull over shirt/dress;Pull shirt over trunk Upper body dressing/undressing: 5: Set-up assist to: Obtain clothing/put away FIM - Lower Body Dressing/Undressing Lower body dressing/undressing steps patient completed: Thread/unthread right pants leg;Thread/unthread left pants leg;Pull pants up/down;Don/Doff left shoe Lower body dressing/undressing: 3: Mod-Patient completed 50-74% of tasks  FIM - Toileting Toileting steps completed by patient: Adjust clothing prior to toileting Toileting Assistive Devices: Grab bar or rail for support Toileting: 2: Max-Patient completed 1 of 3 steps  FIM - Diplomatic Services operational officer Devices: Environmental consultant;Bedside commode;Elevated toilet seat Toilet Transfers: 4-To toilet/BSC: Min A (steadying Pt. > 75%)  FIM - Bed/Chair Transfer Bed/Chair Transfer Assistive Devices: Bed rails;HOB elevated Bed/Chair Transfer: 5: Supine > Sit: Supervision (verbal cues/safety issues);4: Bed > Chair or W/C: Min A (steadying Pt. > 75%)  FIM - Locomotion: Wheelchair Distance: 150' Locomotion: Wheelchair: 6: Travels 150 ft or more, turns around, maneuvers to table, bed or toilet, negotiates 3% grade: maneuvers on rugs and over door sills independently FIM - Locomotion: Ambulation Locomotion: Ambulation Assistive Devices: Designer, industrial/product Ambulation/Gait Assistance: 4: Min assist;4: Min guard Locomotion: Ambulation: 4: Travels 150 ft or more  with minimal assistance (Pt.>75%)  Comprehension Comprehension Mode: Auditory Comprehension: 5-Follows basic conversation/direction: With extra time/assistive device  Expression Expression Mode: Verbal Expression: 5-Expresses basic needs/ideas: With no assist  Social Interaction  Social Interaction: 6-Interacts appropriately with others with medication or extra time (anti-anxiety, antidepressant).  Problem Solving Problem Solving: 5-Solves basic problems: With no assist  Memory Memory: 5-Recognizes or recalls 90% of the time/requires cueing < 10% of the time  Medical Problem List and Plan:  1. DVT Prophylaxis/Anticoagulation: Pharmaceutical: Lovenox.  2. Pain Management: prn hydrocodone effective   -continue current regimen. Utilize heat also for muscle spasms 3. Mood: Appropriate without signs of distress. Will monitor for now.  4. Neuropsych: This patient is capable of making decisions on his/her own behalf.  5. WUJ:WJXBJYNWGNFA Check bid. Off Norvasc and resume lisinopril6. CVA with left hemiparesis: old right frontal and left basal ganglia infarcts in as has worsening of spasticity. May need Zanaflex increased  7. ABLA:   iron supplement.  8. BLL OZH:YQMVHQIO zithromax through 9/12 then dc  -no cough. afebrile 9. Tone: PRAFO and WHO nightly. LOS (Days) 13 A FACE TO FACE EVALUATION WAS PERFORMED  SWARTZ,ZACHARY T 01/31/2012, 8:33 AM

## 2012-01-31 NOTE — Progress Notes (Signed)
Occupational Therapy Session Note  Patient Details  Name: Richard Kent MRN: [id number] Date of Birth: 1929-01-09  Today's Date: 01/31/2012  Session 1 Time: 0700-0745 Time Calculation (min): 45 min  Short Term Goals: Week 2:  OT Short Term Goal 1 (Week 2): Pt. will donn LB clothes with mod assist  OT Short Term Goal 2 (Week 2): Pt will perform 2/3 toileting tasks OT Short Term Goal 3 (Week 2): Pt will perform toilet transfer with mod A using AD PRN OT Short Term Goal 4 (Week 2): Pt will perform tub bench shower transfer with mod A using AD PRN  Skilled Therapeutic Interventions/Progress Updates:    Pt in bed awake and immediately performed supine>sit EOB in preparation for amb with RW to bathroom for shower.  Therapist assisted with donning socks, shoes, and AFO.  Pt performed sit>stand with steady assist and amb to bathroom with steady assist.  Pt used long handle sponge to bathe BLE/feet.  Pt used grab bars to stand for assistance with bathing buttocks while holding onto grab bar.  Pt completed dressing with sit<>stand from w/c using reacher to thread pants.  Pt required assistance donning socks and shoes.  Pt completed all sit<>stands with steady assist with min verbal cues for correct positioning in preparation for successful sit<>stand.  Focus on safety awareness, sit<>stand, stand balance, and activity tolerance.  Therapy Documentation Precautions:  Precautions Precautions: Fall;Posterior Hip Precaution Comments: Pt is an old CVA affecting his right side (where now he has the hip replacement). Pt has a muscle stimulator that he is using in a study with Dr. Creig Hines Dr. Pearlean Brownie and he said it was fine for the pt to wear this with then new hip replacement since the impulse stays  locally (does not radiate). Restrictions Weight Bearing Restrictions: Yes RLE Weight Bearing: Weight bearing as tolerated Pain: Pain Assessment Pain Assessment: 0-10 Pain Score:   5 Pain Type: Acute  pain Pain Location: Groin Pain Orientation: Right Pain Descriptors: Aching Pain Onset: With Activity Patients Stated Pain Goal: 2 Pain Intervention(s): Repositioned    See FIM for current functional status  Therapy/Group: Individual Therapy  Session 2 Time: 1345-1430 Pt denies pain Individual Therapy  Pt practiced sit<>stand from variety of surface heights including EOB, elevated toilet, tub transfer bench, EO mat, and w/c.  Pt required occasional verbal cues for correct positioning in preparation for sit<>stand.  Pt completed sit<>stand with supervision 50% of time and steady assist 50% of time.  Pt practiced retrieving items from variety of surface heights while incorporating sit<>stand without using LUE to assist with sit>stand.    Lavone Neri [facility] 01/31/2012, 7:46 AM

## 2012-01-31 NOTE — Progress Notes (Signed)
Social Work Patient ID: Richard Kent, male   DOB: July 22, 1928, 78 y.o.   MRN: [id number]  Met with patient and wife today to review team conference - both aware we continue to plan toward 9/20 d/c.  Pt very pleased with progress this past week, however, wife confesses "...but I'm still scared about taking him home... I can't lie..." She admits that she is afraid he will fall again at home with her.  Pt attempts to reassure her by giving her a "progress report" of his therapy day today.  Will continue to follow.  Sonora Catlin

## 2012-01-31 NOTE — Progress Notes (Signed)
Physical Therapy Session Note  Patient Details  Name: Richard Kent MRN: [id number] Date of Birth: [birth year]  Today's Date: 01/31/2012 1000-1055 (55 minutes) individual  Pain: 7/10 right hip / nursing notified /meds givend  Short Term Goals: Week 2:  PT Short Term Goal 1 (Week 2): Pt will perform sit <> stands consistently with moderate assist PT Short Term Goal 2 (Week 2): Pt will ambulate 100' with supervision, RW.  PT Short Term Goal 3 (Week 2): Pt will verbalize 3/3 posterior hip precautions and maintain precautions during therapy session.  PT Short Term Goal 4 (Week 2): Pt will perform stand pivot transfer with moderate assist.   Skilled Therapeutic Interventions/Progress Updates: Pt treatment focused on RT LE AROM/ strengthening in supine; standing balance without AD; sit >< supine     Therapy Documentation Precautions:  Precautions Precautions: Fall;Posterior Hip Precaution Comments: Pt is an old CVA affecting his right side (where now he has the hip replacement). Pt has a muscle stimulator that he is using in a study with Dr. Creig Hines Dr. Pearlean Brownie and he said it was fine for the pt to wear this with then new hip replacement since the impulse stays  locally (does not radiate). Restrictions Weight Bearing Restrictions: Yes RLE Weight Bearing: Weight bearing as tolerated General: Pt up in wc   Vital Signs: Therapy Vitals Temp: 97.7 F (36.5 C) Temp src: Oral Pulse Rate: 54  Resp: 18  BP: 145/61 mmHg Patient Position, if appropriate: Lying Oxygen Therapy SpO2: 97 % O2 Device: None (Room air) Pain: Pain Assessment Pain Assessment: 0-10 Pain Score:   5 Pain Type: Acute pain Pain Location: Groin Pain Orientation: Right Pain Descriptors: Aching Pain Onset: With Activity Patients Stated Pain Goal: 2 Pain Intervention(s): Repositioned Mobility: transfers - wc>< mat stand/pivot with RW min to close SBA with several attempts sit to stand; sit to supine (mat) min to  SBA rt LE; supine to sit SBA with difficulty       Trunk/Postural Assessment :Pt sits in posterior pelvic tilt   Balance: Standing without AD reaching in various directions performing ring toss min assist with loss of balance posteriorly X 1   Exercises: heel slides using therapy ball 2 X 15 with passive hip stretch at end of ranged; SAQs X 15  1300-1340 ( 40 minutes) individual Pain: no complaint of pain Focus of treatment: Sit to stand training from various seat heights Treatment: sit to stand transfer without AD mod assist sit to stand from wc with decreased timing of bilateral hip/knee extension; sit to stand from 23 inches/ 21 inches with hands on bedside table in front of patient to facilitate bilateral hip/knee extension (mod assist); standing with hands clasped performing trunk extension /rotation. See FIM for current functional status  Therapy/Group: Individual Therapy  Ritamarie Arkin,JIM 01/31/2012, 7:49 AM

## 2012-01-31 NOTE — Progress Notes (Signed)
Recreational Therapy Session Note  Patient Details  Name: Richard Kent MRN: [id number] Date of Birth: 07-11-1928 Today's Date: 01/31/2012 Time:930-10 Pain: no c/o Skilled Therapeutic Interventions/Progress Updates: Pt stood without UE support for balance activity reaching outside BOS in all directions while adhering to precautions with min cues.  Pt required min assist for balance.  Pt performed w/c mobility on unit using LLE/LUE with distant supervision.Pt requires supervision/set up assist for seated activities.  Therapy/Group: Co-Treatment  Luis Nickles 01/31/2012, 10:54 AM

## 2012-02-01 ENCOUNTER — Inpatient Hospital Stay (HOSPITAL_COMMUNITY): Payer: Medicare Other | Admitting: Physical Therapy

## 2012-02-01 ENCOUNTER — Inpatient Hospital Stay (HOSPITAL_COMMUNITY): Payer: Medicare Other

## 2012-02-01 NOTE — Progress Notes (Signed)
Patient ID: Richard Kent, male   DOB: 17-Aug-1928, 78 y.o.   MRN: [id number]  Subjective/Complaints: No complaints. In bathroom.   A 12 point review of systems has been performed and if not noted above is otherwise negative.   Objective: Vital Signs: Blood pressure 154/59, pulse 60, temperature 97.7 F (36.5 C), temperature source Oral, resp. rate 18, weight 84.3 kg (185 lb 13.6 oz), SpO2 97.00%. No results found. No results found for this or any previous visit (from the past 72 hour(s)).   Physical Exam:  General: Alert and oriented x 3, No apparent distress HEENT: Head is normocephalic, atraumatic, PERRLA, EOMI, sclera anicteric, oral mucosa pink and moist, dentition intact, ext ear canals clear,  Neck: Supple without JVD or lymphadenopathy Heart: Reg rate and rhythm. No murmurs rubs or gallops Chest: CTA bilaterally without wheezes, rales, or rhonchi; no distress Abdomen: Soft, non-tender, non-distended, bowel sounds positive. Extremities: No clubbing, cyanosis, or edema. Pulses are 2+ Skin: Clean and intact wound. No drainage. Staples out Neuro: right upper with 1-2 to 2+ strength, resting tone 1-2 and synergy pattern. RLE is 1-2 as well with 2-3/4 resting tone. Occasional word finding issues but essentially at baseline cogntively with reasonable insight and awareness. Musculoskeletal:Mild pain at the right HF's and rotators appears improved. Posture appropriate Psych: Pt's affect is appropriate. Pt is cooperative    Assessment/Plan: 1. Functional deficits secondary to R Transcervical femur fracture with THA.  R spastic hemiparesis due to prior CVA which require 3+ hours per day of interdisciplinary therapy in a comprehensive inpatient rehab setting. Physiatrist is providing close team supervision and 24 hour management of active medical problems listed below. Physiatrist and rehab team continue to assess barriers to discharge/monitor patient progress toward functional and  medical goals.     FIM: FIM - Bathing Bathing Steps Patient Completed: Chest;Right Arm;Left Arm;Abdomen;Front perineal area;Right upper leg;Left upper leg;Right lower leg (including foot);Left lower leg (including foot) Bathing: 4: Min-Patient completes 8-9 8f 10 parts or 75+ percent  FIM - Upper Body Dressing/Undressing Upper body dressing/undressing steps patient completed: Thread/unthread right sleeve of pullover shirt/dresss;Thread/unthread left sleeve of pullover shirt/dress;Put head through opening of pull over shirt/dress;Pull shirt over trunk Upper body dressing/undressing: 5: Set-up assist to: Obtain clothing/put away FIM - Lower Body Dressing/Undressing Lower body dressing/undressing steps patient completed: Thread/unthread right pants leg;Thread/unthread left pants leg;Pull pants up/down;Don/Doff right shoe Lower body dressing/undressing: 3: Mod-Patient completed 50-74% of tasks  FIM - Toileting Toileting steps completed by patient: Adjust clothing prior to toileting Toileting Assistive Devices: Grab bar or rail for support Toileting: 2: Max-Patient completed 1 of 3 steps  FIM - Diplomatic Services operational officer Devices: Environmental consultant;Bedside commode;Elevated toilet seat Toilet Transfers: 4-To toilet/BSC: Min A (steadying Pt. > 75%)  FIM - Bed/Chair Transfer Bed/Chair Transfer Assistive Devices: Bed rails;HOB elevated Bed/Chair Transfer: 5: Supine > Sit: Supervision (verbal cues/safety issues);4: Bed > Chair or W/C: Min A (steadying Pt. > 75%)  FIM - Locomotion: Wheelchair Distance: 150' Locomotion: Wheelchair: 6: Travels 150 ft or more, turns around, maneuvers to table, bed or toilet, negotiates 3% grade: maneuvers on rugs and over door sills independently FIM - Locomotion: Ambulation Locomotion: Ambulation Assistive Devices: Designer, industrial/product Ambulation/Gait Assistance: 4: Min assist;4: Min guard Locomotion: Ambulation: 4: Travels 150 ft or more with minimal  assistance (Pt.>75%)  Comprehension Comprehension Mode: Auditory Comprehension: 5-Follows basic conversation/direction: With extra time/assistive device  Expression Expression Mode: Verbal Expression: 5-Expresses basic needs/ideas: With no assist  Social Interaction Social Interaction: 6-Interacts appropriately  with others with medication or extra time (anti-anxiety, antidepressant).  Problem Solving Problem Solving: 5-Solves basic problems: With no assist  Memory Memory: 5-Recognizes or recalls 90% of the time/requires cueing < 10% of the time  Medical Problem List and Plan:  1. DVT Prophylaxis/Anticoagulation: Pharmaceutical: Lovenox.  2. Pain Management: prn hydrocodone effective   -continue current regimen. Utilize heat also for muscle spasms 3. Mood: Appropriate without signs of distress. Will monitor for now.  4. Neuropsych: This patient is capable of making decisions on his/her own behalf.  5. WUJ:WJXBJYNWGNFA Check bid. Off Norvasc and resume lisinopril6. CVA with left hemiparesis: old right frontal and left basal ganglia infarcts in as has worsening of spasticity. May need Zanaflex increased  7. ABLA:   iron supplement.  8. BLL OZH:YQMVHQIO zithromax through today  -no cough. afebrile 9. Tone: PRAFO and WHO nightly. LOS (Days) 14 A FACE TO FACE EVALUATION WAS PERFORMED  Kally Cadden T 02/01/2012, 8:11 AM

## 2012-02-01 NOTE — Progress Notes (Signed)
Physical Therapy Session Note  Patient Details  Name: Richard Kent MRN: [id number] Date of Birth: 1928-12-17  Today's Date: 02/01/2012 Time: 4098-1191 Time Calculation (min): 59 min  Short Term Goals: Week 2:  PT Short Term Goal 1 (Week 2): Pt will perform sit <> stands consistently with moderate assist PT Short Term Goal 2 (Week 2): Pt will ambulate 100' with supervision, RW.  PT Short Term Goal 3 (Week 2): Pt will verbalize 3/3 posterior hip precautions and maintain precautions during therapy session.  PT Short Term Goal 4 (Week 2): Pt will perform stand pivot transfer with moderate assist.   Skilled Therapeutic Interventions/Progress Updates:    Stand pivot transfers wheelchair > mat performed with close supervision/min-guard Bed mobility with supervision, verbal cues for maintaining precautions. AROM supine on mat: heel slides and hip abduction/adduction x 10 reps each. Repeated sit <> stands from mat without device, min assist with repeated attempts. Car transfers performed with min assist. Gait training x 150' with RW, pt cued to stop and get "sweet spot" (upright posture, good pelvic alignment,  and aligned RW) before traveling on, this works well for pt. Biodex for balance reactions and improved weight shifting.   Reviewed hip precautions.   Second Session Skilled Therapeutic Interventions/Progress Updates:  Time:  1133-1205 Time Calculation (min): 32 min Pain: 7/10  Able to recall 3/3 posterior hip precautions. Squat pivot transfer performed with supervision, cues for removing armrest. Stair training x 14 steps with min-guard assist, no seated rest needed. Practiced balance reactions while standing on compliant beam feet shoulder width apart and therapist providing manual perturbations through hips and shoulders. All sit <> stands min assist however occasionally pt requires repeated attempts.   Therapy Documentation Precautions:  Precautions Precautions: Fall;Posterior  Hip Precaution Comments: Pt is an old CVA affecting his right side (where now he has the hip replacement). Pt has a muscle stimulator that he is using in a study with Dr. Creig Hines Dr. Pearlean Brownie and he said it was fine for the pt to wear this with then new hip replacement since the impulse stays  locally (does not radiate). Restrictions Weight Bearing Restrictions: No RLE Weight Bearing: Weight bearing as tolerated Pain: Pain Assessment Pain Assessment: 0-10 Pain Score:   7 with both sessions Pain Type: Acute pain Pain Location: Hip Pain Orientation: Right Pain Descriptors: Aching Pain Frequency: Occasional Pain Onset: With Activity Patients Stated Pain Goal: 2 Pain Intervention(s): Repositioned;Other (Comment) (encouraged to call for pain medication as needed)  See FIM for current functional status  Therapy/Group: Individual Therapy both sessions  [name] 02/01/2012, 12:16 PM

## 2012-02-01 NOTE — Progress Notes (Signed)
Occupational Therapy Session Note  Patient Details  Name: Richard Kent MRN: [id number] Date of Birth: [birth year]  Today's Date: 02/01/2012  Session 1 Time: 0700-0754 Time Calculation (min):  Short Term Goals: Week 2:  OT Short Term Goal 1 (Week 2): Pt. will donn LB clothes with mod assist  OT Short Term Goal 2 (Week 2): Pt will perform 2/3 toileting tasks OT Short Term Goal 3 (Week 2): Pt will perform toilet transfer with mod A using AD PRN OT Short Term Goal 4 (Week 2): Pt will perform tub bench shower transfer with mod A using AD PRN  Skilled Therapeutic Interventions/Progress Updates:    Pt amb with RW to complete bathing tasks at walk-in shower level with sit<>stand from shower seat. Pt completed dressing tasks with sit<>stand from w/c.  Pt performed sit>stand with steady A and min verbal cues to scoot forward in seat prior to standing.  Pt able to stand at sink to pull up pants with steady A.  Focus on safety awareness, transfers, sit<>stand, standing balance, and activity tolerance.  Pt continues to demonstrate increased ability to perform sit<>stand with decreasing assistance although patient continues to require verbal cues for set up and safety awareness.  Therapy Documentation Precautions:  Precautions Precautions: Fall;Posterior Hip Precaution Comments: Pt is an old CVA affecting his right side (where now he has the hip replacement). Pt has a muscle stimulator that he is using in a study with Dr. Creig Hines Dr. Pearlean Brownie and he said it was fine for the pt to wear this with then new hip replacement since the impulse stays  locally (does not radiate). Restrictions Weight Bearing Restrictions: No RLE Weight Bearing: Weight bearing as tolerated   Pain: Pain Assessment Pain Assessment: 0-10 Pain Score:   7 Pain Type: Acute pain Pain Location: Hip Pain Orientation: Right Pain Descriptors: Aching Pain Onset: With Activity Pain Intervention(s): Repositioned;Other  (Comment) (encouraged to call for pain medication as needed)  See FIM for current functional status  Therapy/Group: Individual Therapy  Session 2 Time: 1300-1330 Pt did not initially c/o pain; during activity pt stated his pain escalated to 5; repositioned Individual Therapy Pt engaged in sit<>stand and standing activities from various surface heights.  Pt also engaged in functional amb with RW to perform home mgmt tasks.  Pt required steady A and verbal cues for safety throughout session.  Lavone Neri [facility] 02/01/2012, 2:08 PM

## 2012-02-02 ENCOUNTER — Inpatient Hospital Stay (HOSPITAL_COMMUNITY): Payer: Medicare Other

## 2012-02-02 ENCOUNTER — Inpatient Hospital Stay (HOSPITAL_COMMUNITY): Payer: Medicare Other | Admitting: Physical Therapy

## 2012-02-02 DIAGNOSIS — S72009A Fracture of unspecified part of neck of unspecified femur, initial encounter for closed fracture: Secondary | ICD-10-CM

## 2012-02-02 DIAGNOSIS — Z5189 Encounter for other specified aftercare: Secondary | ICD-10-CM

## 2012-02-02 NOTE — Progress Notes (Signed)
Physical Therapy Weekly Progress Note  Patient Details  Name: Coyle Stordahl MRN: [id number] Date of Birth: 1929-04-20  Today's Date: 02/02/2012 Time: 0830-0928 Time Calculation (min): 58 min  Patient has met 4 of 4 short term goals.    Patient continues to demonstrate the following deficits: difficulty with sit <> stands (increased need for assist), impaired gait mechanics elevating falls risk, decreased balance reactions, some difficulty maintaining precautions and therefore will continue to benefit from skilled PT intervention to enhance overall performance with activity tolerance, balance, ability to compensate for deficits and knowledge of precautions.  Patient progressing toward long term goals..  Continue plan of care.  PT Short Term Goals Week 2:  PT Short Term Goal 1 (Week 2): Pt will perform sit <> stands consistently with moderate assist PT Short Term Goal 1 - Progress (Week 2): Met PT Short Term Goal 2 (Week 2): Pt will ambulate 100' with supervision, RW.  PT Short Term Goal 2 - Progress (Week 2): Met PT Short Term Goal 3 (Week 2): Pt will verbalize 3/3 posterior hip precautions and maintain precautions during therapy session.  PT Short Term Goal 3 - Progress (Week 2): Met PT Short Term Goal 4 (Week 2): Pt will perform stand pivot transfer with moderate assist.  PT Short Term Goal 4 - Progress (Week 2): Met  Skilled Therapeutic Interventions/Progress Updates:    Pt able to verbalize 3/3 hip precautions with increased time. Ambulation x 160', 100' with RW and supervision, verbal cues needed to stop and correct RW alignment (walker tends to angle due to Rt. UE tone/weakness) and pt's posture. Repeated sit <> stands from 22" then 21" mat with anterior weight shift reaching task once in standing. Supervision for all transitions from mat however pt frequently needs multiple attempts. Obstacle course working on forwards/backwards walking and negotiation of obstacles, stepping over  obstacles with overall supervision, cues for proper RW placement and posture, overall min assist. Pt continues to need min assist to stand from wheelchair.   Therapy Documentation Precautions:  Precautions Precautions: Fall;Posterior Hip Precaution Comments: Pt s/p an old CVA affecting his right side (where now he has the hip replacement). Pt has a muscle stimulator that he is using in a study with Dr. Creig Hines Dr. Pearlean Brownie and he said it was fine for the pt to wear this with then new hip replacement since the impulse stays  locally (does not radiate). Restrictions Weight Bearing Restrictions: No RLE Weight Bearing: Weight bearing as tolerated Vital Signs:   Pain: Pain Assessment Pain Assessment: 0-10 Pain Score:   5 Pain Type: Surgical pain Pain Location: Hip Pain Orientation: Right Pain Descriptors: Aching Pain Onset: On-going Pain Intervention(s): Other (Comment) (pt reports he had his "regular medicine" already)  See FIM for current functional status  Therapy/Group: Individual Therapy  [name] 02/02/2012, 11:44 AM

## 2012-02-02 NOTE — Progress Notes (Signed)
Physical Therapy Session Note  Patient Details  Name: Richard Kent MRN: [id number] Date of Birth: 1928-09-23  Today's Date: 02/02/2012 Time: 4098-1191 Time Calculation (min): 30 min No pain. Treatment focused on functional gait with RW with emphasis on control of RW when veering to the R, posture and gait speed with overall S. Simulated car transfer with S with RW at height of [place]. Sit to stands for functional strengthening and transfer training with steady A/S x 5 reps. W/c propulsion mod I on unit to/from therapy.     Therapy Documentation Precautions:  Precautions Precautions: Fall;Posterior Hip Precaution Comments: Pt is an old CVA affecting his right side (where now he has the hip replacement). Pt has a muscle stimulator that he is using in a study with Dr. Creig Hines Dr. Pearlean Brownie and he said it was fine for the pt to wear this with then new hip replacement since the impulse stays  locally (does not radiate). Restrictions Weight Bearing Restrictions: No RLE Weight Bearing: Weight bearing as tolerated      Therapy/Group: Individual Therapy  Karolee Stamps [facility] 02/02/2012, 4:20 PM

## 2012-02-02 NOTE — Progress Notes (Signed)
Occupational Therapy Weekly Progress Note  Patient Details  Name: Richard Kent MRN: [id number] Date of Birth: March 22, 1929  Today's Date: 02/02/2012  Patient has met 4 of 4 short term goals. Pt has made steady gains this past week with bathing, dressing, transfers, and toileting.  Pt uses AE to assist with LB bathing and dressing tasks.  Pt continues to make gains with sit<>stand (currently at min A>contact guard) and functional ambulation with RW to access bathroom after discharge.  Pt continues to require min verbal cues for correct positioning (BLE and body) prior to standing to assure success and safety with sit<>stand. Family education with his wife is ongoing and he is on target for d/c next Friday. Patient continues to demonstrate the following deficits: muscle weakness and residual tone from previous CVA challenging his ability to perform sit to stand and standing balance and therefore will continue to benefit from skilled OT intervention to enhance overall performance with BADL and Reduce care partner burden.  Patient progressing toward long term goals..  Continue plan of care.  OT Short Term Goals Week 2:  OT Short Term Goal 1 (Week 2): Pt. will donn LB clothes with mod assist  OT Short Term Goal 1 - Progress (Week 2): Met OT Short Term Goal 2 (Week 2): Pt will perform 2/3 toileting tasks OT Short Term Goal 2 - Progress (Week 2): Met OT Short Term Goal 3 (Week 2): Pt will perform toilet transfer with mod A using AD PRN OT Short Term Goal 3 - Progress (Week 2): Met OT Short Term Goal 4 (Week 2): Pt will perform tub bench shower transfer with mod A using AD PRN OT Short Term Goal 4 - Progress (Week 2): Met  Skilled Therapeutic Interventions/Progress Updates:    continue with POC  Therapy Documentation Precautions:  Precautions Precautions: Fall;Posterior Hip Precaution Comments: Pt is an old CVA affecting his right side (where now he has the hip replacement). Pt has a muscle  stimulator that he is using in a study with Dr. Creig Hines Dr. Pearlean Brownie and he said it was fine for the pt to wear this with then new hip replacement since the impulse stays  locally (does not radiate). Restrictions Weight Bearing Restrictions: No RLE Weight Bearing: Weight bearing as tolerated   ADL: ADL Equipment Provided: Reacher Eating: Modified independent Where Assessed-Eating: Wheelchair Grooming: Supervision/safety Where Assessed-Grooming: Sitting at sink Upper Body Bathing: Minimal assistance Where Assessed-Upper Body Bathing: Shower Lower Body Bathing: Minimal assistance Where Assessed-Lower Body Bathing: Shower Upper Body Dressing: Setup Where Assessed-Upper Body Dressing: Wheelchair Lower Body Dressing: Moderate assistance Where Assessed-Lower Body Dressing: Sitting at sink;Standing at sink;Wheelchair Toileting: Minimal assistance Where Assessed-Toileting: Teacher, adult education: Curator Method: Proofreader: Gaffer: International aid/development worker Method: Ship broker: Insurance underwriter: Insurance underwriter Method: Designer, industrial/product: Shower seat with back  See FIM for current functional status  Therapy/Group: Individual Therapy  [name] 02/02/2012, 2:48 PM

## 2012-02-02 NOTE — Progress Notes (Signed)
Occupational Therapy Session Note  Patient Details  Name: Richard Kent MRN: [id number] Date of Birth: [birth year]  Today's Date: 02/02/2012  Session 1 Time: 0700-0755 Time Calculation (min): 55 min  Short Term Goals: Week 2:  OT Short Term Goal 1 (Week 2): Pt. will donn LB clothes with mod assist  OT Short Term Goal 2 (Week 2): Pt will perform 2/3 toileting tasks OT Short Term Goal 3 (Week 2): Pt will perform toilet transfer with mod A using AD PRN OT Short Term Goal 4 (Week 2): Pt will perform tub bench shower transfer with mod A using AD PRN  Skilled Therapeutic Interventions/Progress Updates:    Focus on sit<>stand, functional amb with RW, AE use, standing balance, and safety awareness within the context of bathing at shower level and dressing from w/c level.  Pt occasionally requires verbal cues for scooting forward in w/c prior to standing.  Pt performs sit<>stand from w/c with steady assist and occasionally supervision.  Pt requires min A when ambulating over uneven surfaces and when negotiating turns in small spaces.  Therapy Documentation Precautions:  Precautions Precautions: Fall;Posterior Hip Precaution Comments: Pt is an old CVA affecting his right side (where now he has the hip replacement). Pt has a muscle stimulator that he is using in a study with Dr. Creig Hines Dr. Pearlean Brownie and he said it was fine for the pt to wear this with then new hip replacement since the impulse stays  locally (does not radiate). Restrictions Weight Bearing Restrictions: No RLE Weight Bearing: Weight bearing as tolerated Pain: Pain Assessment Pain Assessment: No/denies pain  See FIM for current functional status  Therapy/Group: Individual Therapy  Session 2 Time: 1400-1430 Pt denies pain Individual Therapy Pt rolled to gym in w/c to engage in sit<>stand activities, functional ambulation with RW for reaching tasks, and activities requiring use of reacher while standing.  Pt completed  sit<>stand X 15 requiring assistance only 2 times.  Pt continues to require min verbal cues for positioning body correctly to ensure success with sit<>stand.  Lavone Neri [facility] 02/02/2012, 7:56 AM

## 2012-02-03 ENCOUNTER — Inpatient Hospital Stay (HOSPITAL_COMMUNITY): Payer: Medicare Other | Admitting: Physical Therapy

## 2012-02-03 NOTE — Progress Notes (Addendum)
Physical Therapy Note  Patient Details  Name: Richard Kent MRN: [id number] Date of Birth: November 21, 1928 Today's Date: 02/03/2012  1000-1055 (55 minutes) group Pain: no complaint of pain Pt participated in PT group session focused on gait training/safety/endurance. Pt ambulates 80 feet X 1 with RW with min to closed SBA. Pt has difficulty steering AD ( towards right) secondary to decreased RT UE control. Pt ambulated 30 feet , 80 feet with therapist in front of patient without AD to facilitate increased weight bearing RT LE  With forward trunk lurch secondary to decreased RT hip control in stance.  Richard Kent,JIM 02/03/2012, 8:14 AM

## 2012-02-03 NOTE — Progress Notes (Signed)
Patient ID: Richard Kent, male   DOB: 10-Sep-1928, 78 y.o.   MRN: [id number]  Subjective/Complaints: No complaints   Objective: Vital Signs: Blood pressure 153/63, pulse 56, temperature 97.7 F (36.5 C), temperature source Oral, resp. rate 18, weight 185 lb 13.6 oz (84.3 kg), SpO2 98.00%. No results found. No results found for this or any previous visit (from the past 72 hour(s)).   Physical Exam:  NAD Chest- cta CV- reg Rate ABd- soft - nontender  Assessment/Plan: 1. Functional deficits secondary to R Transcervical femur fracture with THA.  R spastic hemiparesis due to prior CVA which require 3+ hours per day of interdisciplinary therapy in a comprehensive inpatient rehab setting. Physiatrist is providing close team supervision and 24 hour management of active medical problems listed below. Physiatrist and rehab team continue to assess barriers to discharge/monitor patient progress toward functional and medical goals.   Medical Problem List and Plan:  1. DVT Prophylaxis/Anticoagulation: Pharmaceutical: Lovenox.  2. Pain Management: prn hydrocodone effective   -continue current regimen. Utilize heat also for muscle spasms 3. Mood: Appropriate without signs of distress. Will monitor for now.  4. Neuropsych: This patient is capable of making decisions on his/her own behalf.  5. WUJ:WJXBJYNWGNFA Check bid. Off Norvasc and resume lisinopril6. CVA with left hemiparesis: old right frontal and left basal ganglia infarcts in as has worsening of spasticity. May need Zanaflex increased  7. ABLA:   iron supplement.  CBC:    Component Value Date/Time   WBC 11.1* 01/19/2012 0700   HGB 10.8* 01/19/2012 0700   HCT 30.7* 01/19/2012 0700   PLT 278 01/19/2012 0700   MCV 93.0 01/19/2012 0700   NEUTROABS 8.7* 01/19/2012 0700   LYMPHSABS 1.1 01/19/2012 0700   MONOABS 1.2* 01/19/2012 0700   EOSABS 0.1 01/19/2012 0700   BASOSABS 0.1 01/19/2012 0700   8. BLL OZH:YQMVHQION ABX 9. Tone: PRAFO  and WHO nightly. LOS (Days) 16 A FACE TO FACE EVALUATION WAS PERFORMED  Yashvi Jasinski HENRY 02/03/2012, 9:16 AM

## 2012-02-04 ENCOUNTER — Inpatient Hospital Stay (HOSPITAL_COMMUNITY): Payer: Medicare Other | Admitting: Physical Therapy

## 2012-02-04 MED ORDER — LISINOPRIL 10 MG PO TABS
10.0000 mg | ORAL_TABLET | Freq: Every day | ORAL | Status: DC
  Administered 2012-02-05 – 2012-02-09 (×5): 10 mg via ORAL
  Filled 2012-02-04 (×6): qty 1

## 2012-02-04 NOTE — Progress Notes (Signed)
Physical Therapy Note  Patient Details  Name: Richard Kent MRN: [id number] Date of Birth: [birth year] Today's Date: 02/04/2012  1500-1555 (55 minutes) group Pain: no complaint of pain Pt participated in PT group session focused on gait training/ safety/endurance. Pt ambulates 80 feet X 2 with RW close SBA for safety; Nustep Level 3 x 10 minutes for activity tolerance.   Caspar Favila,JIM 02/04/2012, 8:01 AM

## 2012-02-04 NOTE — Progress Notes (Signed)
Patient ID: Richard Kent, male   DOB: 17-Nov-1928, 78 y.o.   MRN: [id number]  Subjective/Complaints: No complaints- he is eager to go home "in one week"   Objective: Vital Signs: Blood pressure 153/55, pulse 60, temperature 97.6 F (36.4 C), temperature source Oral, resp. rate 18, weight 185 lb 13.6 oz (84.3 kg), SpO2 95.00%. Physical Exam:  NAD Chest- cta CV- reg Rate ABd- soft - nontender  Assessment/Plan: 1. Functional deficits secondary to R Transcervical femur fracture with THA.  R spastic hemiparesis due to prior CVA which require 3+ hours per day of interdisciplinary therapy in a comprehensive inpatient rehab setting. Physiatrist is providing close team supervision and 24 hour management of active medical problems listed below. Physiatrist and rehab team continue to assess barriers to discharge/monitor patient progress toward functional and medical goals.   Medical Problem List and Plan:  1. DVT Prophylaxis/Anticoagulation: Pharmaceutical: Lovenox.  2. Pain Management: prn hydrocodone effective   -continue current regimen. Utilize heat also for muscle spasms 3. Mood: Appropriate without signs of distress. Will monitor for now.  4. Neuropsych: This patient is capable of making decisions on his/her own behalf.  5. JXB:JYNWGNFAOZHY Check bid. Off Norvasc and resume lisinopril-- needs better control- will increase lisinopril (02/04/2012) 6. CVA with left hemiparesis: old right frontal and left basal ganglia infarcts in as has worsening of spasticity. May need Zanaflex increased  7. ABLA:   iron supplement.  CBC:    Component Value Date/Time   WBC 11.1* 01/19/2012 0700   HGB 10.8* 01/19/2012 0700   HCT 30.7* 01/19/2012 0700   PLT 278 01/19/2012 0700   MCV 93.0 01/19/2012 0700   NEUTROABS 8.7* 01/19/2012 0700   LYMPHSABS 1.1 01/19/2012 0700   MONOABS 1.2* 01/19/2012 0700   EOSABS 0.1 01/19/2012 0700   BASOSABS 0.1 01/19/2012 0700   8. BLL QMV:HQIONGEXB ABX 9. Tone:  PRAFO and WHO nightly. LOS (Days) 17 A FACE TO FACE EVALUATION WAS PERFORMED  Richard Kent 02/04/2012, 9:31 AM

## 2012-02-05 ENCOUNTER — Inpatient Hospital Stay (HOSPITAL_COMMUNITY): Payer: Medicare Other

## 2012-02-05 ENCOUNTER — Inpatient Hospital Stay (HOSPITAL_COMMUNITY): Payer: Medicare Other | Admitting: Physical Therapy

## 2012-02-05 NOTE — Progress Notes (Signed)
Occupational Therapy Session Note  Patient Details  Name: Richard Kent MRN: [id number] Date of Birth: Feb 22, 1929  Today's Date: 02/05/2012  Session 1 Time: 0700-0754 Time Calculation (min): 54 min  Short Term Goals: Week 3:   STG=LTG  Skilled Therapeutic Interventions/Progress Updates:    Pt in bed upon arrival.  Pt agreeable to bathing at shower level.  Pt sat EOB while therapist prepared shower.  Pt required assistance donning bilateral socks and shoes prior to ambulating into bathroom.  Pt completed bathing requiring assistance only with bathing LUE and buttocks.  Pt uses long handle sponge independently for bathing feet.  Pt completed grooming tasks while standing at sink.  Pt completed dressing with assistance only for donning socks, right shoe, and tieing both shoes.  Pt performed 100% of sit<>stand with supervision only this morning.  Pt occasionally requires verbal cues for scooting to edge of chair and/or BLE positioning prior to standing.  Focus on activity tolerance, safety awareness, sit<>stand, and standing balance.  Therapy Documentation Precautions:  Precautions Precautions: Fall;Posterior Hip Precaution Comments: Pt is an old CVA affecting his right side (where now he has the hip replacement). Pt has a muscle stimulator that he is using in a study with Dr. Creig Hines Dr. Pearlean Brownie and he said it was fine for the pt to wear this with then new hip replacement since the impulse stays  locally (does not radiate). Restrictions Weight Bearing Restrictions: Yes RLE Weight Bearing: Weight bearing as tolerated  Pain: Pain Assessment Pain Assessment: No/denies pain  See FIM for current functional status  Therapy/Group: Individual Therapy  Session 2 Time: 1330-1400 Individual Therapy Pt denies pain Wife present for session.  Focus on bed transfers, tub bench transfers, and toilet transfers.  Wife stated she would come to therapy again tomorrow to practice transfers again.   Wife stated she would assist with bathing LUE and buttocks and donning socks and shoes prior to this hospitalization.  Discussed importance of regular activity after discharge to maintain strength and endurance to decrease risk of future falls.  Lavone Neri [facility] 02/05/2012, 8:01 AM

## 2012-02-05 NOTE — Progress Notes (Addendum)
Physical Therapy Session Note  Patient Details  Name: Richard Kent MRN: [id number] Date of Birth: November 09, 1928  Today's Date: 02/05/2012 Time: 0800-0927 Time Calculation (min): 57 min  Short Term Goals: Week 3:   =LTGs  Skilled Therapeutic Interventions/Progress Updates:    Stand pivot transfer performed with light min assist (assist needed for final transition into standing), cues needed for Lt. brake. Side stepping along mat in slight squat position for hip stabilization and quad strengthening. Foot taps with RW support, PT to facilitate pelvic stability and appropriate posture. Ambulation x 100', 220' with RW facilitating carry-over of posture and pelvic stability. Pt to attempt to self correct posture and RW alignment 3x during gait however pt unable to remember this without verbal cues.   Therapy Documentation Precautions:  Precautions Precautions: Fall;Posterior Hip Precaution Comments: Pt is an old CVA affecting his right side (where now he has the hip replacement). Pt has a muscle stimulator that he is using in a study with Dr. Creig Hines Dr. Pearlean Brownie and he said it was fine for the pt to wear this with then new hip replacement since the impulse stays  locally (does not radiate). Restrictions Weight Bearing Restrictions: Yes RLE Weight Bearing: Weight bearing as tolerated Pain: Pain Assessment Pain Assessment: No/denies pain  See FIM for current functional status  Therapy/Group: Individual Therapy  [name] 02/05/2012, 8:41 AM

## 2012-02-05 NOTE — Progress Notes (Signed)
Physical Therapy Session Note  Patient Details  Name: Richard Kent MRN: [id number] Date of Birth: [birth year]  Today's Date: 02/05/2012 Time: 8469-6295 Time Calculation (min): 28 min  Short Term Goals: Week 3:   = LTGs  Skilled Therapeutic Interventions/Progress Updates:    Pt performed flight of stairs (12), continuous with up to min assist (with descent). Pt will benefit from further practice prior to D/C. Stand pivot transfers practiced with pt performing wheelchair set up, supervision. Forwards foot taps working pelvic stability and single limb stance, pt needs RW and unable to perform without UE support. Wife present at end of session, educated on utilization of verbal cues for pt to avoid hip internal rotation with turning Rt., discussed need for outpatient followup therapy. Wife will need further eduction, reports she will be present for therapy tomorrow. Pt currently able to perform sit <> stands with supervision 75% of time, requires min assist 25% of time.   Therapy Documentation Precautions:  Precautions Precautions: Fall;Posterior Hip Precaution Comments: Pt s/p old CVA affecting his right side (where now he has the hip replacement). Pt has a muscle stimulator that he is using in a study with Dr. Creig Hines Dr. Pearlean Brownie and he said it was fine for the pt to wear this with then new hip replacement since the impulse stays  locally (does not radiate). Restrictions Weight Bearing Restrictions: Yes RLE Weight Bearing: Weight bearing as tolerated Pain:  5/10 Rt. Hip pain, declined medication Locomotion : Ambulation Ambulation/Gait Assistance: 5: Supervision   See FIM for current functional status  Therapy/Group: Individual Therapy  [name] 02/05/2012, 5:44 PM

## 2012-02-05 NOTE — Progress Notes (Signed)
Patient ID: Richard Kent, male   DOB: 1928-07-06, 78 y.o.   MRN: [id number]  Subjective/Complaints: No complaints. Up with therapies already. In good spirits.  A 12 point review of systems has been performed and if not noted above is otherwise negative.   Objective: Vital Signs: Blood pressure 152/81, pulse 58, temperature 98.1 F (36.7 C), temperature source Oral, resp. rate 18, weight 84.3 kg (185 lb 13.6 oz), SpO2 95.00%. No results found. No results found for this or any previous visit (from the past 72 hour(s)).   Physical Exam:  General: Alert and oriented x 3, No apparent distress HEENT: Head is normocephalic, atraumatic, PERRLA, EOMI, sclera anicteric, oral mucosa pink and moist, dentition intact, ext ear canals clear,  Neck: Supple without JVD or lymphadenopathy Heart: Reg rate and rhythm. No murmurs rubs or gallops Chest: CTA bilaterally without wheezes, rales, or rhonchi; no distress Abdomen: Soft, non-tender, non-distended, bowel sounds positive. Extremities: No clubbing, cyanosis, or edema. Pulses are 2+ Skin: Clean and intact wound. No drainage. Staples out Neuro: right upper with 1-2 to 2+ strength, resting tone 1-2 and synergy pattern. RLE is 1-2 as well with 2-3/4 resting tone. Occasional word finding issues but essentially at baseline cogntively with reasonable insight and awareness. Musculoskeletal:Mild pain at the right HF's and rotators appears improved. Posture appropriate Psych: Pt's affect is appropriate. Pt is cooperative    Assessment/Plan: 1. Functional deficits secondary to R Transcervical femur fracture with THA.  R spastic hemiparesis due to prior CVA which require 3+ hours per day of interdisciplinary therapy in a comprehensive inpatient rehab setting. Physiatrist is providing close team supervision and 24 hour management of active medical problems listed below. Physiatrist and rehab team continue to assess barriers to discharge/monitor patient  progress toward functional and medical goals.     FIM: FIM - Bathing Bathing Steps Patient Completed: Chest;Right Arm;Left Arm;Abdomen;Front perineal area;Right upper leg;Left upper leg;Right lower leg (including foot);Left lower leg (including foot) Bathing: 4: Min-Patient completes 8-9 68f 10 parts or 75+ percent  FIM - Upper Body Dressing/Undressing Upper body dressing/undressing steps patient completed: Thread/unthread right sleeve of pullover shirt/dresss;Thread/unthread left sleeve of pullover shirt/dress;Put head through opening of pull over shirt/dress;Pull shirt over trunk Upper body dressing/undressing: 5: Set-up assist to: Obtain clothing/put away FIM - Lower Body Dressing/Undressing Lower body dressing/undressing steps patient completed: Thread/unthread right pants leg;Thread/unthread left pants leg;Pull pants up/down;Don/Doff right sock;Don/Doff right shoe Lower body dressing/undressing: 3: Mod-Patient completed 50-74% of tasks  FIM - Toileting Toileting steps completed by patient: Adjust clothing prior to toileting;Performs perineal hygiene Toileting Assistive Devices: Grab bar or rail for support Toileting: 3: Mod-Patient completed 2 of 3 steps  FIM - Diplomatic Services operational officer Devices: Environmental consultant;Bedside commode;Elevated toilet seat Toilet Transfers: 4-To toilet/BSC: Min A (steadying Pt. > 75%)  FIM - Bed/Chair Transfer Bed/Chair Transfer Assistive Devices: Therapist, occupational: 5: Supine > Sit: Supervision (verbal cues/safety issues);4: Bed > Chair or W/C: Min A (steadying Pt. > 75%)  FIM - Locomotion: Wheelchair Distance: 150' Locomotion: Wheelchair: 6: Travels 150 ft or more, turns around, maneuvers to table, bed or toilet, negotiates 3% grade: maneuvers on rugs and over door sills independently FIM - Locomotion: Ambulation Locomotion: Ambulation Assistive Devices: Designer, industrial/product Ambulation/Gait Assistance: 5: Supervision Locomotion:  Ambulation: 5: Travels 150 ft or more with supervision/safety issues  Comprehension Comprehension Mode: Auditory Comprehension: 5-Understands complex 90% of the time/Cues < 10% of the time  Expression Expression Mode: Verbal Expression: 5-Expresses complex 90% of the time/cues <  10% of the time  Social Interaction Social Interaction: 5-Interacts appropriately 90% of the time - Needs monitoring or encouragement for participation or interaction.  Problem Solving Problem Solving: 5-Solves complex 90% of the time/cues < 10% of the time  Memory Memory: 5-Recognizes or recalls 90% of the time/requires cueing < 10% of the time  Medical Problem List and Plan:  1. DVT Prophylaxis/Anticoagulation: Pharmaceutical: Lovenox.  2. Pain Management: prn hydrocodone effective   -overall improved 3. Mood: Appropriate without signs of distress. Will monitor for now.  4. Neuropsych: This patient is capable of making decisions on his/her own behalf.  5. WUJ:[id number] Check bid. Off Norvasc and resume lisinopril 6. CVA with left hemiparesis: old right frontal and left basal ganglia infarcts in 2011. No change at present 7. ABLA:   iron supplement.  8. BLL OZH:YQMVHQIO zithromax through today  -no cough. afebrile 9. Tone: PRAFO and WHO nightly. LOS (Days) 18 A FACE TO FACE EVALUATION WAS PERFORMED  SWARTZ,ZACHARY T 02/05/2012, 9:15 AM

## 2012-02-06 ENCOUNTER — Inpatient Hospital Stay (HOSPITAL_COMMUNITY): Payer: Medicare Other | Admitting: Physical Therapy

## 2012-02-06 ENCOUNTER — Inpatient Hospital Stay (HOSPITAL_COMMUNITY): Payer: Medicare Other

## 2012-02-06 NOTE — Progress Notes (Signed)
Occupational Therapy Session Note  Patient Details  Name: Richard Kent MRN: [id number] Date of Birth: 20-Aug-1928  Today's Date: 02/06/2012 Time: 0700-0745 Time Calculation (min): 45 min  Short Term Goals: Week 3:   STG=LTG  Skilled Therapeutic Interventions/Progress Updates:    Pt sitting EOB upon arrival.  Pt engaged in bathing at walk-in shower level and dressing at w/c level.  Pt requires min A for shower transfers while ambulating over uneven surfaces in bathroom.  Pt uses AE (long handle sponge and reacher) appropriately to assist with LB bathing and dressing.  Pt continues to require min verbal cues to prepare for sit<>stand at sink.  Focus on safety awareness, sit<>stand, standing balance, and activity tolerance.  Therapy Documentation Precautions:  Precautions Precautions: Fall;Posterior Hip Precaution Comments: Pt is an old CVA affecting his right side (where now he has the hip replacement). Pt has a muscle stimulator that he is using in a study with Dr. Creig Hines Dr. Pearlean Brownie and he said it was fine for the pt to wear this with then new hip replacement since the impulse stays  locally (does not radiate). Restrictions Weight Bearing Restrictions: Yes RLE Weight Bearing: Weight bearing as tolerated Pain: Pain Assessment Pain Assessment: No/denies pain Pain Score: Asleep  See FIM for current functional status  Therapy/Group: Individual Therapy  Session 2 Pt denies pain Individual therapy Pt's wife present to practice toilet transfers and tub bench transfers.  Wife uses gait belt appropriately during transfers and provides appropriate verbal cues during preparation for transfers and during transfers. Pt completed transfers with steady assist from wife. Recommended completing "dry run" of tub transfer when at home before actually taking shower.  Pt's wife stated that she felt comfortable with transfers and agreed that they shouldn't take any unnecessary risks at home.      Lavone Neri [facility] 02/06/2012, 7:47 AM

## 2012-02-06 NOTE — Progress Notes (Signed)
Patient ID: Richard Kent, male   DOB: [birth year], 78 y.o.   MRN: [id number]  Subjective/Complaints: No complaints. Up with therapies already. In good spirits.  A 12 point review of systems has been performed and if not noted above is otherwise negative.   Objective: Vital Signs: Blood pressure 133/81, pulse 54, temperature 97.6 F (36.4 C), temperature source Oral, resp. rate 16, weight 84.3 kg (185 lb 13.6 oz), SpO2 95.00%. No results found. No results found for this or any previous visit (from the past 72 hour(s)).   Physical Exam:  General: Alert and oriented x 3, No apparent distress HEENT: Head is normocephalic, atraumatic, PERRLA, EOMI, sclera anicteric, oral mucosa pink and moist, dentition intact, ext ear canals clear,  Neck: Supple without JVD or lymphadenopathy Heart: Reg rate and rhythm. No murmurs rubs or gallops Chest: CTA bilaterally without wheezes, rales, or rhonchi; no distress Abdomen: Soft, non-tender, non-distended, bowel sounds positive. Extremities: No clubbing, cyanosis, or edema. Pulses are 2+ Skin: Clean and intact wound. No drainage. Staples out Neuro: right upper with 1-2 to 2+ strength, resting tone 1-2 and synergy pattern. RLE is 1-2 as well with 2-3/4 resting tone. Occasional word finding issues but essentially at baseline cogntively with reasonable insight and awareness. Musculoskeletal:Mild pain at the right HF's and rotators appears improved. Posture appropriate Psych: Pt's affect is appropriate. Pt is cooperative    Assessment/Plan: 1. Functional deficits secondary to R Transcervical femur fracture with THA.  R spastic hemiparesis due to prior CVA which require 3+ hours per day of interdisciplinary therapy in a comprehensive inpatient rehab setting. Physiatrist is providing close team supervision and 24 hour management of active medical problems listed below. Physiatrist and rehab team continue to assess barriers to discharge/monitor patient  progress toward functional and medical goals.     FIM: FIM - Bathing Bathing Steps Patient Completed: Chest;Right Arm;Left Arm;Abdomen;Front perineal area;Right upper leg;Left upper leg;Right lower leg (including foot);Left lower leg (including foot) Bathing: 4: Min-Patient completes 8-9 62f 10 parts or 75+ percent  FIM - Upper Body Dressing/Undressing Upper body dressing/undressing steps patient completed: Thread/unthread right sleeve of pullover shirt/dresss;Thread/unthread left sleeve of pullover shirt/dress;Put head through opening of pull over shirt/dress;Pull shirt over trunk Upper body dressing/undressing: 5: Set-up assist to: Obtain clothing/put away FIM - Lower Body Dressing/Undressing Lower body dressing/undressing steps patient completed: Thread/unthread right pants leg;Thread/unthread left pants leg;Pull pants up/down;Don/Doff right sock;Don/Doff right shoe Lower body dressing/undressing: 4: Min-Patient completed 75 plus % of tasks  FIM - Toileting Toileting steps completed by patient: Adjust clothing prior to toileting;Performs perineal hygiene Toileting Assistive Devices: Grab bar or rail for support Toileting: 3: Mod-Patient completed 2 of 3 steps  FIM - Diplomatic Services operational officer Devices: Environmental consultant;Bedside commode;Elevated toilet seat Toilet Transfers: 4-To toilet/BSC: Min A (steadying Pt. > 75%)  FIM - Bed/Chair Transfer Bed/Chair Transfer Assistive Devices: Therapist, occupational: 5: Supine > Sit: Supervision (verbal cues/safety issues);4: Bed > Chair or W/C: Min A (steadying Pt. > 75%)  FIM - Locomotion: Wheelchair Distance: 150' Locomotion: Wheelchair: 6: Travels 150 ft or more, turns around, maneuvers to table, bed or toilet, negotiates 3% grade: maneuvers on rugs and over door sills independently FIM - Locomotion: Ambulation Locomotion: Ambulation Assistive Devices: Designer, industrial/product Ambulation/Gait Assistance: 5: Supervision Locomotion:  Ambulation: 5: Travels 150 ft or more with supervision/safety issues  Comprehension Comprehension Mode: Auditory Comprehension: 5-Understands complex 90% of the time/Cues < 10% of the time  Expression Expression Mode: Verbal Expression: 5-Expresses complex 90% of the  time/cues < 10% of the time  Social Interaction Social Interaction: 5-Interacts appropriately 90% of the time - Needs monitoring or encouragement for participation or interaction.  Problem Solving Problem Solving: 5-Solves complex 90% of the time/cues < 10% of the time  Memory Memory: 5-Recognizes or recalls 90% of the time/requires cueing < 10% of the time  Medical Problem List and Plan:  1. DVT Prophylaxis/Anticoagulation: Pharmaceutical: Lovenox.  2. Pain Management: prn hydrocodone effective   -overall improved 3. Mood: Appropriate without signs of distress. Will monitor for now.  4. Neuropsych: This patient is capable of making decisions on his/her own behalf.  5. ZOX:[id number] Check bid. Off Norvasc but resumed lisinopril 6. CVA with left hemiparesis: old right frontal and left basal ganglia infarcts in 2011. No change at present 7. ABLA:   iron supplement.  8. BLL PNA:abx completed  -no cough. afebrile 9. Tone: PRAFO and WHO nightly  -is at baseline. No  changes at this time. LOS (Days) 19 A FACE TO FACE EVALUATION WAS PERFORMED  Carel Carrier T 02/06/2012, 8:07 AM

## 2012-02-06 NOTE — Progress Notes (Signed)
Physical Therapy Session Note  Patient Details  Name: Richard Kent MRN: [id number] Date of Birth: 04-04-1929  Today's Date: 02/06/2012 Time: 0830-0928 Time Calculation (min): 58 min  Short Term Goals: Week 3:   =LTGs  Skilled Therapeutic Interventions/Progress Updates:    Practiced with wheelchair placement for scoot/stand pivot transfers, pt needed cues for removal of armrest for scoot transfer. Sit <> stands pt needed min assist for initial stand from low mat. Side stepping with slight squat working on weight shifting, balance, and bil. Hip strengthening. Standing balance activity placing horseshoes on basketball goal without UE, progressed to standing on compliant surface for increased difficulty. Pt has difficulty with anterior weight shift. NuStep level 6 x , cues for increased use of Rt. LE. Pt not flexing hip beyond 90 degrees. RPE = 13. Ambulation training x 100' with supervision, cues for posture and RW midline positioning.   Therapy Documentation Precautions:  Precautions Precautions: Fall;Posterior Hip Precaution Comments: Pt is an old CVA affecting his right side (where now he has the hip replacement). Pt has a muscle stimulator that he is using in a study with Dr. Creig Hines Dr. Pearlean Brownie and he said it was fine for the pt to wear this with then new hip replacement since the impulse stays  locally (does not radiate). Restrictions Weight Bearing Restrictions: Yes RLE Weight Bearing: Weight bearing as tolerated Pain: Pain Assessment Pain Assessment: 0-10 Pain Score:   6 Faces Pain Scale: No hurt Pain Type: Surgical pain Pain Location: Hip Pain Orientation: Right Pain Descriptors: Aching Pain Onset: On-going Pain Intervention(s): Repositioned;Other (Comment) (declined pain medication)  See FIM for current functional status  Therapy/Group: Individual Therapy  [name] 02/06/2012, 12:29 PM

## 2012-02-06 NOTE — Progress Notes (Signed)
Physical Therapy Session Note  Patient Details  Name: Richard Kent MRN: [id number] Date of Birth: [birth year]  Today's Date: 02/06/2012 Time: 4098-1191 Time Calculation (min): 54 min  Short Term Goals: Week 3:   = Long term goals  Skilled Therapeutic Interventions/Progress Updates:    Session primarily focused on family education. Wife and daughter present and both educated however wife performed physical assistance as she will be primary care taker at home. Pt performed flight of stairs (close supervision), car transfer (with min assist), gait x 150' with platform walker (supervision), and scoot pivot transfer (supervision) with wife providing appropriate level of assist. Educated wife with all activities on safe positioning, hand placement, direction of force when providing assistance (for instance promoting anterior weight shift during sit > stand rather than pulling up on pt). Wife made aware of verbal cues to use for improved gait mechanics and for adherence to hip precautions. Wife aware of need to double check safety with pt such as breaks prior to transfers.    Therapy Documentation Precautions:  Precautions Precautions: Fall;Posterior Hip Precaution Comments: Pt is an old CVA affecting his right side (where now he has the hip replacement). Pt has a muscle stimulator that he is using in a study with Dr. Creig Hines Dr. Pearlean Brownie and he said it was fine for the pt to wear this with then new hip replacement since the impulse stays  locally (does not radiate). Restrictions Weight Bearing Restrictions: Yes RLE Weight Bearing: Weight bearing as tolerated Pain: Pain Assessment Pain Assessment: 0-10 Pain Score:   5 Faces Pain Scale: No hurt Pain Type: Surgical pain Pain Location: Hip Pain Orientation: Right Pain Descriptors: Aching Pain Onset: With Activity Pain Intervention(s): Repositioned;Other (Comment) (declined medication) Locomotion : Ambulation Ambulation/Gait Assistance:  5: Supervision Wheelchair Mobility Distance: >200'   See FIM for current functional status  Therapy/Group: Individual Therapy  [name] 02/06/2012, 5:18 PM

## 2012-02-07 ENCOUNTER — Inpatient Hospital Stay (HOSPITAL_COMMUNITY): Payer: Medicare Other | Admitting: Occupational Therapy

## 2012-02-07 ENCOUNTER — Inpatient Hospital Stay (HOSPITAL_COMMUNITY): Payer: Medicare Other | Admitting: Physical Therapy

## 2012-02-07 ENCOUNTER — Inpatient Hospital Stay (HOSPITAL_COMMUNITY): Payer: Medicare Other

## 2012-02-07 DIAGNOSIS — S72009A Fracture of unspecified part of neck of unspecified femur, initial encounter for closed fracture: Secondary | ICD-10-CM

## 2012-02-07 DIAGNOSIS — Z5189 Encounter for other specified aftercare: Secondary | ICD-10-CM

## 2012-02-07 NOTE — Patient Care Conference (Signed)
Inpatient RehabilitationTeam Conference Note Date: 02/06/2012   Time: 2:30 PM    Patient Name: Richard Kent      Medical Record Number: [id number]  Date of Birth: 1928/10/29 Sex: Male         Room/Bed: 4039/4039-01 Payor Info: Payor: MEDICARE  Plan: MEDICARE PART A AND B  Product Type: *No Product type*     Admitting Diagnosis: r fnf,old left cva  Admit Date/Time:  01/18/2012  4:05 PM Admission Comments: No comment available   Primary Diagnosis:  Hemiplegia affecting unspecified side, late effect of cerebrovascular disease Principal Problem: Hemiplegia affecting unspecified side, late effect of cerebrovascular disease  Patient Active Problem List   Diagnosis Date Noted  . CAP (community acquired pneumonia) 01/14/2012  . Carotid stenosis, bilateral 01/14/2012  . Hypoxia 01/13/2012  . RBBB 01/13/2012  . Fracture of femoral neck, right 01/12/2012  . Hypertension 01/12/2012  . Carotid artery calcification 01/12/2012  . DYSLIPIDEMIA 12/17/2009  . CVA WITH LEFT HEMIPARESIS 12/17/2009  . RBBB 10/21/2009    Expected Discharge Date: Expected Discharge Date: 02/09/12  Team Members Present: Physician: Dr. Faith Rogue Social Worker Present: Amada Jupiter, LCSW Nurse Present: Other (comment) Berta Minor, RN) PT Present: Reggy Eye, PT OT Present: Mackie Pai, OT SLP Present: Feliberto Gottron, SLP     Current Status/Progress Goal Weekly Team Focus  Medical   pain controlled  maintaining current tone level  see prior   Bowel/Bladder   continent bowel and bladder refusing miralax lbm 9-16  min assist   monitor bowel management    Swallow/Nutrition/ Hydration             ADL's   min A UB ADL and bathing; mod A LB dressing; supervision/min A sit<>stand; min A toilet transfers   min A overall   sit<>stand, standing balance, transfers, activity tolerance    Mobility   Min, nearly supervision for sit <> stands. Supervision for ambulation however still with impaired  mechanics  Supervision for all transfers, Supervision for gait.   Improve sit <> stand transition, promote safety with gait, family education.   Communication             Safety/Cognition/ Behavioral Observations            Pain   vicodin 1 every 6 hours right hip pain   less than 3  monitor effectiveness of pain meds   Skin   redness buttocks decreased barrier cream prn no breakdown   no new breakdown   educate on skin care     Rehab Goals Patient on target to meet rehab goals: Yes *See Interdisciplinary Assessment and Plan and progress notes for long and short-term goals  Barriers to Discharge: premorbid gait difficulties    Possible Resolutions to Barriers:  family ed/training    Discharge Planning/Teaching Needs:  home with wife to provide 24/7 assistance      Team Discussion:  Good gains and on target for 9/20 d/c - family education going well with no concerns  Revisions to Treatment Plan:  None   Continued Need for Acute Rehabilitation Level of Care: The patient requires daily medical management by a physician with specialized training in physical medicine and rehabilitation for the following conditions: Daily direction of a multidisciplinary physical rehabilitation program to ensure safe treatment while eliciting the highest outcome that is of practical value to the patient.: Yes Daily medical management of patient stability for increased activity during participation in an intensive rehabilitation regime.: Yes Daily analysis of laboratory values  and/or radiology reports with any subsequent need for medication adjustment of medical intervention for : Post surgical problems;Other;Neurological problems  Genise Strack 02/07/2012, 10:46 AM

## 2012-02-07 NOTE — Progress Notes (Addendum)
Occupational Therapy Session Note  Patient Details  Name: Tahjee Monday MRN: [id number] Date of Birth: [birth year]  Today's Date: 02/07/2012 Time: 0700-0745 Time Calculation (min): 45 min  Short Term Goals: Week 3:   STG=LTG  Skilled Therapeutic Interventions/Progress Updates:    Pt sitting EOB upon arrival.  Pt engaged in ADL retraining including bathing at shower level and dressing with sit<>stand from w/c at sink.  Pt required min verbal cues for BUE placement prior to standing. Pt completed grooming tasks while standing at sink. Pt amb with RW to and from bathroom with steady assist.  Focus on safety awareness, sit<>stand, standing balance, and activity tolerance.  Therapy Documentation Precautions:  Precautions Precautions: Fall;Posterior Hip Precaution Comments: Pt is an old CVA affecting his right side (where now he has the hip replacement). Pt has a muscle stimulator that he is using in a study with Dr. Creig Hines Dr. Pearlean Brownie and he said it was fine for the pt to wear this with then new hip replacement since the impulse stays  locally (does not radiate). Restrictions Weight Bearing Restrictions: Yes RLE Weight Bearing: Weight bearing as tolerated Pain: Pain Assessment Pain Assessment: No/denies pain  See FIM for current functional status  Therapy/Group: Individual Therapy  [name] 02/07/2012, 7:46 AM

## 2012-02-07 NOTE — Progress Notes (Signed)
Patient ID: Richard Kent, male   DOB: 20-Jun-1928, 78 y.o.   MRN: [id number]  Subjective/Complaints: No complaints. Getting up to EOB  A 12 point review of systems has been performed and if not noted above is otherwise negative.   Objective: Vital Signs: Blood pressure 149/71, pulse 55, temperature 97.8 F (36.6 C), temperature source Oral, resp. rate 18, weight 84.3 kg (185 lb 13.6 oz), SpO2 98.00%. No results found. No results found for this or any previous visit (from the past 72 hour(s)).   Physical Exam:  General: Alert and oriented x 3, No apparent distress HEENT: Head is normocephalic, atraumatic, PERRLA, EOMI, sclera anicteric, oral mucosa pink and moist, dentition intact, ext ear canals clear,  Neck: Supple without JVD or lymphadenopathy Heart: Reg rate and rhythm. No murmurs rubs or gallops Chest: CTA bilaterally without wheezes, rales, or rhonchi; no distress Abdomen: Soft, non-tender, non-distended, bowel sounds positive. Extremities: No clubbing, cyanosis, or edema. Pulses are 2+ Skin: Clean and intact wound. No drainage. Staples out Neuro: right upper with 1-2 to 2+ strength, resting tone 1-2 and synergy pattern. RLE is 1-2 as well with 2-3/4 resting tone. Occasional word finding issues but essentially at baseline cogntively with reasonable insight and awareness. Musculoskeletal:minimal pain at the right HF's and rotators appears improved. Posture appropriate Psych: Pt's affect is appropriate. Pt is cooperative    Assessment/Plan: 1. Functional deficits secondary to R Transcervical femur fracture with THA.  R spastic hemiparesis due to prior CVA which require 3+ hours per day of interdisciplinary therapy in a comprehensive inpatient rehab setting. Physiatrist is providing close team supervision and 24 hour management of active medical problems listed below. Physiatrist and rehab team continue to assess barriers to discharge/monitor patient progress toward functional and  medical goals.     FIM: FIM - Bathing Bathing Steps Patient Completed: Chest;Right Arm;Left Arm;Abdomen;Front perineal area;Right upper leg;Left upper leg;Right lower leg (including foot);Left lower leg (including foot) Bathing: 4: Min-Patient completes 8-9 44f 10 parts or 75+ percent  FIM - Upper Body Dressing/Undressing Upper body dressing/undressing steps patient completed: Thread/unthread right sleeve of pullover shirt/dresss;Thread/unthread left sleeve of pullover shirt/dress;Put head through opening of pull over shirt/dress;Pull shirt over trunk Upper body dressing/undressing: 5: Set-up assist to: Obtain clothing/put away FIM - Lower Body Dressing/Undressing Lower body dressing/undressing steps patient completed: Thread/unthread right pants leg;Thread/unthread left pants leg;Pull pants up/down;Don/Doff right sock;Don/Doff right shoe Lower body dressing/undressing: 4: Min-Patient completed 75 plus % of tasks  FIM - Toileting Toileting steps completed by patient: Adjust clothing prior to toileting;Performs perineal hygiene Toileting Assistive Devices: Grab bar or rail for support Toileting: 3: Mod-Patient completed 2 of 3 steps  FIM - Diplomatic Services operational officer Devices: Environmental consultant;Bedside commode;Elevated toilet seat Toilet Transfers: 4-To toilet/BSC: Min A (steadying Pt. > 75%)  FIM - Bed/Chair Transfer Bed/Chair Transfer Assistive Devices: Therapist, occupational: 5: Supine > Sit: Supervision (verbal cues/safety issues);5: Sit > Supine: Supervision (verbal cues/safety issues);4: Bed > Chair or W/C: Min A (steadying Pt. > 75%);4: Chair or W/C > Bed: Min A (steadying Pt. > 75%)  FIM - Locomotion: Wheelchair Distance: >200' Locomotion: Wheelchair: 6: Travels 150 ft or more, turns around, maneuvers to table, bed or toilet, negotiates 3% grade: maneuvers on rugs and over door sills independently FIM - Locomotion: Ambulation Locomotion: Ambulation Assistive Devices:  TEFL teacher Ambulation/Gait Assistance: 5: Supervision Locomotion: Ambulation: 5: Travels 150 ft or more with supervision/safety issues  Comprehension Comprehension Mode: Auditory Comprehension: 5-Understands complex 90% of the time/Cues <  10% of the time  Expression Expression Mode: Verbal Expression: 5-Expresses complex 90% of the time/cues < 10% of the time  Social Interaction Social Interaction: 5-Interacts appropriately 90% of the time - Needs monitoring or encouragement for participation or interaction.  Problem Solving Problem Solving: 5-Solves complex 90% of the time/cues < 10% of the time  Memory Memory: 5-Recognizes or recalls 90% of the time/requires cueing < 10% of the time  Medical Problem List and Plan:  1. DVT Prophylaxis/Anticoagulation: Pharmaceutical: Lovenox.  2. Pain Management: prn hydrocodone effective   -overall improved 3. Mood: Appropriate without signs of distress. Will monitor for now.  4. Neuropsych: This patient is capable of making decisions on his/her own behalf.  5. ZOX:[id number] Check bid. Off Norvasc but resumed lisinopril 6. CVA with left hemiparesis: old right frontal and left basal ganglia infarcts in 2011. No change at present 7. ABLA:   iron supplement.  8. BLL PNA:abx completed  -no cough. afebrile 9. Tone: PRAFO and WHO nightly  -is at baseline. No  changes at this time. LOS (Days) 20 A FACE TO FACE EVALUATION WAS PERFORMED  Zamya Culhane T 02/07/2012, 7:07 AM

## 2012-02-07 NOTE — Progress Notes (Signed)
Physical Therapy Note  Patient Details  Name: Richard Kent MRN: [id number] Date of Birth: 1929/02/18 Today's Date: 02/07/2012  Time: 1300-1400 60 minutes  No c/o pain.  Pt participated in therapy group for gait training with RW min A to close supervision for gait in controlled environment and side/backward stepping.   Pt required min A and cuing for obstacle negotiation, close supervision for stair negotiation.  Sit to stand repetitions with min A from w/c level.  Group therapy   [place] Monds 02/07/2012, 5:28 PM

## 2012-02-07 NOTE — Progress Notes (Signed)
Occupational Therapy Session Note  Patient Details  Name: Richard Kent MRN: [id number] Date of Birth: 29-Mar-1929  Today's Date: 02/07/2012 Time: 0900-1000 and 2:00-2:30 Time Calculation (min): 60 min and 30 min  Short Term Goals=LTGs  Skilled Therapeutic Interventions/Progress Updates:  1)  Patient resting in w/c upon arrival.  OT session included propel w/c, tub bench transfers, sit><stands from various heights, side steps, turns with RW to avoid RLE internal rotation.  Focus on strengthening, adaptive techniques, activity tolerance, adhering to hip precautions with all functional mobility.  2)  Patient propelled w/c to his hospital room to practice sit><stands, transfer to and from recliner with and without w/c cushion in seat to elevate.  Had patient return demonstrate technique we reviewed this morning regarding avoid RLE internal rotation during right and turning left with use of RW.  Patient required vc's for memory regarding techniques yet his return demonstrated was performed without VCs.  Patient declined to rest in recliner or bed and asked to return back to w/c.  Therapy Documentation Precautions:  Precautions Precautions: Fall;Posterior Hip Precaution Comments: Pt is an old CVA affecting his right side (where now he has the hip replacement). Pt has a muscle stimulator that he is using in a study with Dr. Creig Hines Dr. Pearlean Brownie and he said it was fine for the pt to wear this with then new hip replacement since the impulse stays  locally (does not radiate). Restrictions Weight Bearing Restrictions: Yes RLE Weight Bearing: Weight bearing as tolerated Pain: Pain Assessment Pain Score: 0-No pain Faces Pain Scale: No hurt  See FIM for current functional status  Therapy/Group: Individual Therapy  [name] 02/07/2012, 12:49 PM

## 2012-02-07 NOTE — Progress Notes (Signed)
Social Work Patient ID: Richard Kent, male   DOB: [birth year], 78 y.o.   MRN: [id number]  Reviewed team conference with patient and wife - pleased with progress and both feeling ready for d/c 9/20.  Arranging HH and DME for d/c.  No concerns.  Therasa Lorenzi

## 2012-02-08 ENCOUNTER — Inpatient Hospital Stay (HOSPITAL_COMMUNITY): Payer: Medicare Other | Admitting: *Deleted

## 2012-02-08 ENCOUNTER — Inpatient Hospital Stay (HOSPITAL_COMMUNITY): Payer: Medicare Other | Admitting: Physical Therapy

## 2012-02-08 ENCOUNTER — Inpatient Hospital Stay (HOSPITAL_COMMUNITY): Payer: Medicare Other | Admitting: Occupational Therapy

## 2012-02-08 ENCOUNTER — Inpatient Hospital Stay (HOSPITAL_COMMUNITY): Payer: Medicare Other

## 2012-02-08 LAB — CBC
HCT: 38.9 % — ABNORMAL LOW (ref 39.0–52.0)
Hemoglobin: 13.1 g/dL (ref 13.0–17.0)
MCH: 33.1 pg (ref 26.0–34.0)
MCHC: 33.7 g/dL (ref 30.0–36.0)
RBC: 3.96 MIL/uL — ABNORMAL LOW (ref 4.22–5.81)

## 2012-02-08 LAB — BASIC METABOLIC PANEL
BUN: 19 mg/dL (ref 6–23)
CO2: 30 mEq/L (ref 19–32)
Chloride: 101 mEq/L (ref 96–112)
GFR calc non Af Amer: 83 mL/min — ABNORMAL LOW (ref >90)
Glucose, Bld: 103 mg/dL — ABNORMAL HIGH (ref 70–99)
Potassium: 4.1 mEq/L (ref 3.5–5.1)
Sodium: 136 mEq/L (ref 135–145)

## 2012-02-08 MED ORDER — INFLUENZA VIRUS VACC SPLIT PF IM SUSP
0.5000 mL | INTRAMUSCULAR | Status: AC | PRN
  Administered 2012-02-09: 0.5 mL via INTRAMUSCULAR
  Filled 2012-02-08: qty 0.5

## 2012-02-08 NOTE — Progress Notes (Signed)
Recreational Therapy Session Note  Patient Details  Name: Richard Kent MRN: [id number] Date of Birth: 05-Apr-1929 Today's Date: 02/08/2012 Time:  02-1027 Pain: no c/o Skilled Therapeutic Interventions/Progress Updates: w/c mobility and simple TR seated with supervision.  Pt ambulated with RW on outdoor community surfaces with min assist.  Discussed community reintegration, identification and negotiation of obstacles, and energy conservation techniques.  Therapy/Group: Individual Therapy  Freeda Spivey 02/08/2012, 4:17 PM

## 2012-02-08 NOTE — Progress Notes (Signed)
Occupational Therapy Session Note  Patient Details  Name: Richard Kent MRN: [id number] Date of Birth: Aug 01, 1928  Today's Date: 02/08/2012 Time: 0700-0745 Time Calculation (min): 45 min  Short Term Goals: Week 3:   STG=LTG  Skilled Therapeutic Interventions/Progress Updates:    Pt sitting EOB upon arrival.  Pt amb with RW to bathroom for toileting and shower.  Pt completed dressing with sit<>stand from w/c at sink with correct UE and LE positioning.  Pt completed all sit<>stands this morning with supervision only.  When patient unsuccessful he would stop, reposition, and attempt sit<>stand again.  Focus on transfers, sit<>stand, safety awareness, and activity tolerance  Therapy Documentation Precautions:  Precautions Precautions: Fall;Posterior Hip Precaution Comments: Pt is an old CVA affecting his right side (where now he has the hip replacement). Pt has a muscle stimulator that he is using in a study with Dr. Creig Hines Dr. Pearlean Brownie and he said it was fine for the pt to wear this with then new hip replacement since the impulse stays  locally (does not radiate). Restrictions Weight Bearing Restrictions: Yes RLE Weight Bearing: Weight bearing as tolerated Pain: Pain Assessment Pain Assessment: No/denies pain  See FIM for current functional status  Therapy/Group: Individual Therapy  [name] 02/08/2012, 10:03 AM

## 2012-02-08 NOTE — Progress Notes (Signed)
Occupational Therapy Discharge Summary  Patient Details  Name: Richard Kent MRN: [id number] Date of Birth: 04-11-1929  Today's Date: 02/08/2012  Patient has met 7 of 7 long term goals due to improved activity tolerance, improved balance, postural control, ability to compensate for deficits and adherence to precautions.  Pt made steady progress this admission with bathing, dressing, toilet and shower transfers, and toileting.  Pt requires min A for bathing, LB dressing, toilet and shower transfers, and toileting.  Pt is supervision for UB dressing, grooming, and mod I for eating. Pt occasionally requires verbal cues for safety awareness primarily with sit<>stand. Patient to discharge at overall mod I to min A level.  Patient's care partner is independent to provide the necessary physical assistance at discharge.     Recommendation:  Patient will benefit from ongoing skilled OT services in home health setting to continue to advance functional skills in the area of BADL.  Equipment: No equipment provided Pt owns tub transfer bench and BSC  Reasons for discharge: treatment goals met and discharge from hospital  Patient/family agrees with progress made and goals achieved: Yes  OT Discharge   ADL ADL Equipment Provided: Reacher;Long-handled sponge Eating: Modified independent Where Assessed-Eating: Wheelchair Grooming: Modified independent Where Assessed-Grooming: Sitting at sink;Wheelchair Upper Body Bathing: Supervision/safety Where Assessed-Upper Body Bathing: Shower Lower Body Bathing: Minimal assistance Where Assessed-Lower Body Bathing: Shower Upper Body Dressing: Supervision/safety Where Assessed-Upper Body Dressing: Wheelchair;Sitting at sink Lower Body Dressing: Minimal assistance Where Assessed-Lower Body Dressing: Wheelchair;Sitting at sink;Standing at sink Toileting: Supervision/safety Where Assessed-Toileting: Teacher, adult education: Curator  Method: Proofreader: Gaffer: International aid/development worker Method: Ship broker: Insurance underwriter: Insurance underwriter Method: Designer, industrial/product: Information systems manager with back Vision/Perception  Vision - History Baseline Vision: Wears glasses only for reading  Cognition Overall Cognitive Status: Appears within functional limits for tasks assessed Arousal/Alertness: Awake/alert Orientation Level: Oriented X4 Sensation Sensation Light Touch: Appears Intact Coordination Gross Motor Movements are Fluid and Coordinated: No Fine Motor Movements are Fluid and Coordinated: No Motor  Motor Motor: Hemiplegia;Abnormal tone    Trunk/Postural Assessment  Cervical Assessment Cervical Assessment: Within Functional Limits Thoracic Assessment Thoracic Assessment: Exceptions to [facility] Thoracic AROM Overall Thoracic AROM Comments: increased thoracic flexion  Lumbar Assessment Lumbar Assessment: Exceptions to [facility] Lumbar AROM Overall Lumbar AROM Comments: posterior pelvic tilt with increased weight on right     Extremity/Trunk Assessment RUE Assessment RUE Assessment: Exceptions to [facility] (Decreased ROM, plus maximum tone 3/4 on [place]) LUE Assessment LUE Assessment: Within Functional Limits  See FIM for current functional status  [name] 02/08/2012, 3:34 PM

## 2012-02-08 NOTE — Progress Notes (Signed)
Physical Therapy Session Note  Patient Details  Name: Richard Kent MRN: [id number] Date of Birth: [birth year]  Today's Date: 02/08/2012 Time: 1030-1129 Time Calculation (min): 59 min  Short Term Goals: Week 3:   = LTGs  Skilled Therapeutic Interventions/Progress Updates:    Ambulation x 200' with RW, supervision. Pt continues to need cues for posture and safe alignment of RW. Car transfer performed with min assist, verbal cues for UE positioning for efficiency and safety. Household ambulation x 60' with supervision (backwards walking, side stepping) over carpeted and tile surfaces with supervision. Wheelchair mobility on unit and in home environment modified independent including tight space negotiation and management in kitchen. Anterior weight shifting activities without UE support performed with close supervision.   Therapy Documentation Precautions:  Precautions Precautions: Fall;Posterior Hip Precaution Comments: Pt is an old CVA affecting his right side (where now he has the hip replacement). Pt has a muscle stimulator that he is using in a study with Dr. Creig Hines Dr. Pearlean Brownie and he said it was fine for the pt to wear this with then new hip replacement since the impulse stays  locally (does not radiate). Restrictions Weight Bearing Restrictions: Yes RLE Weight Bearing: Weight bearing as tolerated Pain: Pain Assessment Pain Assessment: 0-10 Pain Score:   6 Pain Type: Acute pain Pain Location: Hip Pain Orientation: Right Pain Descriptors: Aching Pain Onset: With Activity Pain Intervention(s): Repositioned;Other (Comment) (declined medicine)  See FIM for current functional status  Therapy/Group: Individual Therapy  [name] 02/08/2012, 12:25 PM

## 2012-02-08 NOTE — Progress Notes (Signed)
Patient ID: Richard Kent, male   DOB: 1929/01/20, 78 y.o.   MRN: [id number]  Subjective/Complaints: No complaints. Up with OT already  A 12 point review of systems has been performed and if not noted above is otherwise negative.   Objective: Vital Signs: Blood pressure 134/83, pulse 53, temperature 97.1 F (36.2 C), temperature source Oral, resp. rate 18, weight 84.3 kg (185 lb 13.6 oz), SpO2 97.00%. No results found. No results found for this or any previous visit (from the past 72 hour(s)).   Physical Exam:  General: Alert and oriented x 3, No apparent distress HEENT: Head is normocephalic, atraumatic, PERRLA, EOMI, sclera anicteric, oral mucosa pink and moist, dentition intact, ext ear canals clear,  Neck: Supple without JVD or lymphadenopathy Heart: Reg rate and rhythm. No murmurs rubs or gallops Chest: CTA bilaterally without wheezes, rales, or rhonchi; no distress Abdomen: Soft, non-tender, non-distended, bowel sounds positive. Extremities: No clubbing, cyanosis, or edema. Pulses are 2+ Skin: Clean and intact wound. No drainage. Staples out Neuro: right upper with 1-2 to 2+ strength, resting tone 1-2 and synergy pattern. RLE is 1-2 as well with 2-3/4 resting tone. Occasional word finding issues but essentially at baseline cogntively with reasonable insight and awareness. Musculoskeletal:minimal pain at the right HF's and rotators appears improved. Posture appropriate Psych: Pt's affect is appropriate. Pt is cooperative    Assessment/Plan: 1. Functional deficits secondary to R Transcervical femur fracture with THA.  R spastic hemiparesis due to prior CVA which require 3+ hours per day of interdisciplinary therapy in a comprehensive inpatient rehab setting. Physiatrist is providing close team supervision and 24 hour management of active medical problems listed below. Physiatrist and rehab team continue to assess barriers to discharge/monitor patient progress toward functional  and medical goals.     FIM: FIM - Bathing Bathing Steps Patient Completed: Chest;Right Arm;Left Arm;Abdomen;Front perineal area;Right upper leg;Left upper leg;Right lower leg (including foot);Left lower leg (including foot) Bathing: 4: Min-Patient completes 8-9 82f 10 parts or 75+ percent  FIM - Upper Body Dressing/Undressing Upper body dressing/undressing steps patient completed: Thread/unthread right sleeve of pullover shirt/dresss;Thread/unthread left sleeve of pullover shirt/dress;Put head through opening of pull over shirt/dress;Pull shirt over trunk Upper body dressing/undressing: 5: Set-up assist to: Obtain clothing/put away FIM - Lower Body Dressing/Undressing Lower body dressing/undressing steps patient completed: Thread/unthread right pants leg;Thread/unthread left pants leg;Pull pants up/down;Don/Doff right sock;Don/Doff right shoe Lower body dressing/undressing: 4: Min-Patient completed 75 plus % of tasks  FIM - Toileting Toileting steps completed by patient: Adjust clothing prior to toileting;Performs perineal hygiene;Adjust clothing after toileting Toileting Assistive Devices: Grab bar or rail for support Toileting: 4: Steadying assist  FIM - Diplomatic Services operational officer Devices: Environmental consultant;Bedside commode;Elevated toilet seat Toilet Transfers: 4-To toilet/BSC: Min A (steadying Pt. > 75%)  FIM - Bed/Chair Transfer Bed/Chair Transfer Assistive Devices: Therapist, occupational: 5: Supine > Sit: Supervision (verbal cues/safety issues);5: Bed > Chair or W/C: Supervision (verbal cues/safety issues)  FIM - Locomotion: Wheelchair Distance: >200' Locomotion: Wheelchair: 6: Travels 150 ft or more, turns around, maneuvers to table, bed or toilet, negotiates 3% grade: maneuvers on rugs and over door sills independently FIM - Locomotion: Ambulation Locomotion: Ambulation Assistive Devices: TEFL teacher Ambulation/Gait Assistance: 5: Supervision Locomotion:  Ambulation: 5: Travels 150 ft or more with supervision/safety issues  Comprehension Comprehension Mode: Auditory Comprehension: 5-Understands complex 90% of the time/Cues < 10% of the time  Expression Expression Mode: Verbal Expression: 5-Expresses complex 90% of the time/cues < 10% of the time  Social Interaction Social Interaction: 5-Interacts appropriately 90% of the time - Needs monitoring or encouragement for participation or interaction.  Problem Solving Problem Solving: 5-Solves complex 90% of the time/cues < 10% of the time  Memory Memory: 5-Recognizes or recalls 90% of the time/requires cueing < 10% of the time  Medical Problem List and Plan:  1. DVT Prophylaxis/Anticoagulation: Pharmaceutical: Lovenox.  2. Pain Management: prn hydrocodone effective   -overall improved 3. Mood: Appropriate without signs of distress. Will monitor for now.  4. Neuropsych: This patient is capable of making decisions on his/her own behalf.  5. ZOX:WRUEAVWUJWJX Check bid. Off Norvasc but resumed lisinopril 6. CVA with left hemiparesis: old right frontal and left basal ganglia infarcts in 2011. No change at present 7. ABLA:   iron supplement.   -check once more today 8. BLL PNA:abx completed  -no cough. afebrile 9. Tone: PRAFO and WHO nightly  -is at baseline. No  changes at this time. LOS (Days) 21 A FACE TO FACE EVALUATION WAS PERFORMED  Chandlar Guice T 02/08/2012, 7:20 AM

## 2012-02-08 NOTE — Progress Notes (Signed)
Physical Therapy Discharge Summary  Patient Details  Name: Richard Kent MRN: [id number] Date of Birth: 1928/09/24  Today's Date: 02/08/2012 Time: 1515-1600 Time Calculation (min): 45 min   Skilled Therapeutic Interventions/Progress Updates:  Session focused on family education with stairs. Wife practicing flight of stairs with pt again, practiced with different body placements for safety. Wife taking notes of instruction for reference. Practiced positioning and hand placement (if needed) on PT acting as pt.  Gait x 200' with RW with wife practicing verbal cues for improved posture and RW alignment. Discussed safety in the home, recommended supervision when pt on his feet. Discussed risk of falls with pt and need to listen to cues from wife to avoid another hospital stay.    Patient has met 9 of 9 long term goals due to improved activity tolerance, improved balance, increased strength, decreased pain, ability to compensate for deficits and improved awareness. Pt has made significant gains since admission progressing from +2 total assistance, unable to ambulation or stand to currently ambulating at supervision level. Patient to discharge at an ambulatory level Supervision/min assist.   Patient's care partner is independent to provide the necessary physical assistance at discharge.  Reasons goals not met: NA  Recommendation:  Patient will benefit from ongoing skilled PT services in outpatient setting to continue to advance safe functional mobility, address ongoing impairments in balance, decreased functional mobility, poor gait mechanics and posture, occasional decreased safety awareness, and minimize fall risk. Pt would also benefit from further practice with his functional electrical stimulation device with ambulation to promote return to prior level of function and development of a community fitness plan.   Equipment: RW  Reasons for discharge: treatment goals met and discharge from  hospital  Patient/family agrees with progress made and goals achieved: Yes  PT Discharge Pain Pain Assessment Pain Assessment: 0-10 Pain Score:   6 Pain Type: Acute pain Pain Location: Hip Pain Orientation: Right Pain Descriptors: Aching Pain Onset: With Activity Pain Intervention(s): Repositioned;Other (Comment) (declined medication) Vision/Perception  Vision - History Baseline Vision: Wears glasses only for reading  Cognition Overall Cognitive Status: Appears within functional limits for tasks assessed Arousal/Alertness: Awake/alert Orientation Level: Oriented X4 Memory: Impaired Memory Impairment: Decreased recall of new information;Decreased long term memory Problem Solving: Impaired Safety/Judgment: Impaired Comments: Long standing impairments secondary to prior stroke however they elevate falls risk, pt will benefit from 24 hour supervision for safety.  Sensation Sensation Light Touch: Appears Intact (bil. LEs) Coordination Gross Motor Movements are Fluid and Coordinated: No Fine Motor Movements are Fluid and Coordinated: No Motor  Motor Motor: Hemiplegia;Abnormal tone (Rt. UE/LE)  Mobility Bed Mobility Supine to Sit: 5: Supervision Sit to Supine: 5: Supervision Sit to Supine - Details (indicate cue type and reason): Cues for hip precautions Transfers Sit to Stand: 5: Supervision;4: Min assist Sit to Stand Details (indicate cue type and reason): supervision to min assist fluctuating in ability to forwards weightshift throughout sessions.  Stand to Sit: 5: Supervision Stand Pivot Transfers: 5: Supervision Squat Pivot Transfers: 5: Supervision Locomotion  Ambulation Ambulation: Yes Ambulation/Gait Assistance: 5: Supervision Ambulation Distance (Feet): 200 Feet Assistive device: Rolling walker Ambulation/Gait Assistance Details: Pt requires cues to correct posture and alignment of RW, "find your sweet spot." Decreased ability to self correct.  Gait Gait:  Yes Gait Pattern: Impaired Gait Pattern: Step-through pattern;Decreased step length - right;Decreased step length - left;Decreased hip/knee flexion - right;Decreased weight shift to right;Right foot flat;Trendelenburg;Trunk flexed;Decreased dorsiflexion - right High Level Ambulation High Level Ambulation: Side  stepping;Backwards walking Side Stepping: Supervision Backwards Walking: supervision Stairs / Additional Locomotion Stairs: Yes Stairs Assistance: 5: Supervision Stairs Assistance Details (indicate cue type and reason): Wife providing supervision. PT to educate on improved hand/body positioning. Pt's wife practiced with therapist and wrote down directions per request.  Stair Management Technique: One rail Left;Step to pattern Number of Stairs: 14  Height of Stairs: 6  (inch) Wheelchair Mobility Wheelchair Mobility: Yes Wheelchair Assistance: 6: Modified independent (Device/Increase time) Wheelchair Propulsion: Left upper extremity;Left lower extremity Wheelchair Parts Management: Supervision/cueing Distance: >200'  Trunk/Postural Assessment  Cervical Assessment Cervical Assessment: Within Functional Limits Thoracic Assessment Thoracic Assessment: Exceptions to [facility] Thoracic AROM Overall Thoracic AROM Comments: increased thoracic flexion  Lumbar Assessment Lumbar Assessment: Exceptions to [facility] Lumbar AROM Overall Lumbar AROM Comments: posterior pelvic tilt with increased weight on right   Balance Balance Balance Assessed: Yes Static Sitting Balance Static Sitting - Level of Assistance: 6: Modified independent (Device/Increase time) Dynamic Sitting Balance Dynamic Sitting - Level of Assistance: 6: Modified independent (Device/Increase time) Static Standing Balance Static Standing - Level of Assistance: 5: Stand by assistance Extremity Assessment  RUE Assessment RUE Assessment: Exceptions to [facility] (Decreased ROM, plus maximum tone 3/4 on [place]) LUE Assessment LUE  Assessment: Within Functional Limits RLE Assessment RLE Assessment: Exceptions to [facility] RLE Strength RLE Overall Strength Comments: decreased ankle ROM/strength (has an AFO), 3+/5 knee and hip LLE Assessment LLE Assessment: Exceptions to [facility] LLE Strength LLE Overall Strength Comments: 4-/5 grossly  See FIM for current functional status  [name] 02/08/2012, 4:27 PM

## 2012-02-08 NOTE — Progress Notes (Signed)
Occupational Therapy Session Note  Patient Details  Name: Richard Kent MRN: [id number] Date of Birth: 1928/08/30  Today's Date: 02/08/2012 Time: 1308-6578 Time Calculation (min): 30 min  Short Term Goals=LTGs  Skilled Therapeutic Interventions/Progress Updates:  Patient resting in w/c and wife at his bedside upon arrival.  Wife and patient stated that they did not have any concerns or issues for OT related to being prepared for discharge.  Wife remained in the room while patient in therapy.  Practiced functional mobility with RW and transfer to sofa that was 18" high.  Focused session on turning both directions to complete a functional task to review need for patient to lead with the correct foot to avoid internal rotation and patient needed cues 50% of the time.  Patient required max assist for sit>stand from this low surface which is simular to the height of his recliner at home.  Upon returning to patient's room, reviewed need for cues during turning and wife wrote down the information.  Also reviewed with wife my concern for patient not to sit in low seat at home given his hip precautions.  Therapy Documentation Precautions:  Precautions Precautions: Fall;Posterior Hip Precaution Comments: Pt is an old CVA affecting his right side (where now he has the hip replacement). Pt has a muscle stimulator that he is using in a study with Dr. Creig Hines Dr. Pearlean Brownie and he said it was fine for the pt to wear this with then new hip replacement since the impulse stays  locally (does not radiate). Restrictions Weight Bearing Restrictions: Yes RLE Weight Bearing: Weight bearing as tolerated Pain: 5/10 right hip, declined medication  See FIM for current functional status  Therapy/Group: Individual Therapy  Richard Kent 02/08/2012, 3:08 PM

## 2012-02-08 NOTE — Progress Notes (Signed)
Recreational Therapy Discharge Summary Patient Details  Name: Richard Kent MRN: [id number] Date of Birth: Aug 18, 1928 Today's Date: 02/08/2012  Long term goals set: 1  Long term goals met: 1  Comments on progress toward goals: Pt has made excellent progress toward goal and is ready for discharge home with wife.  Pt requires supervision/set up assist for TR tasks while seated w/c level.  Discussed community reintegration including importance of remaining active post discharge, energy conservation techniques and ways to negotiate obstacles. Reasons for discharge: discharge from hospital   Patient/family agrees with progress made and goals achieved: Yes  David Rodriquez 02/08/2012, 4:19 PM

## 2012-02-09 MED ORDER — LISINOPRIL 10 MG PO TABS: 10.0000 mg | ORAL_TABLET | Freq: Every day | ORAL | Status: DC

## 2012-02-09 MED ORDER — HYDROCODONE-ACETAMINOPHEN 5-325 MG PO TABS: 1.0000 | ORAL_TABLET | Freq: Three times a day (TID) | ORAL | Status: AC | PRN

## 2012-02-09 MED ORDER — POLYETHYLENE GLYCOL 3350 17 G PO PACK: 17.0000 g | PACK | Freq: Every day | ORAL | Status: DC

## 2012-02-09 MED ORDER — HYDROCORTISONE 1 % EX CREA
TOPICAL_CREAM | Freq: Three times a day (TID) | CUTANEOUS | Status: DC | PRN
  Filled 2012-02-09: qty 28

## 2012-02-09 NOTE — Progress Notes (Signed)
Patient ID: Richard Kent, male   DOB: 1928/09/11, 78 y.o.   MRN: [id number]  Subjective/Complaints: No complaints. Up with OT already  A 12 point review of systems has been performed and if not noted above is otherwise negative.   Objective: Vital Signs: Blood pressure 143/71, pulse 54, temperature 97.7 F (36.5 C), temperature source Oral, resp. rate 16, weight 84.3 kg (185 lb 13.6 oz), SpO2 99.00%. No results found. Results for orders placed during the hospital encounter of 01/18/12 (from the past 72 hour(s))  BASIC METABOLIC PANEL     Status: Abnormal   Collection Time   02/08/12  9:40 AM      Component Value Range Comment   Sodium 136  135 - 145 mEq/L    Potassium 4.1  3.5 - 5.1 mEq/L    Chloride 101  96 - 112 mEq/L    CO2 30  19 - 32 mEq/L    Glucose, Bld 103 (*) 70 - 99 mg/dL    BUN 19  6 - 23 mg/dL    Creatinine, Ser 4.09  0.50 - 1.35 mg/dL    Calcium 9.4  8.4 - 81.1 mg/dL    GFR calc non Af Amer 83 (*) >90 mL/min    GFR calc Af Amer >90  >90 mL/min   CBC     Status: Abnormal   Collection Time   02/08/12  9:40 AM      Component Value Range Comment   WBC 5.4  4.0 - 10.5 K/uL    RBC 3.96 (*) 4.22 - 5.81 MIL/uL    Hemoglobin 13.1  13.0 - 17.0 g/dL    HCT 91.4 (*) 78.2 - 52.0 %    MCV 98.2  78.0 - 100.0 fL    MCH 33.1  26.0 - 34.0 pg    MCHC 33.7  30.0 - 36.0 g/dL    RDW 95.6  21.3 - 08.6 %    Platelets 271  150 - 400 K/uL      Physical Exam:  General: Alert and oriented x 3, No apparent distress HEENT: Head is normocephalic, atraumatic, PERRLA, EOMI, sclera anicteric, oral mucosa pink and moist, dentition intact, ext ear canals clear,  Neck: Supple without JVD or lymphadenopathy Heart: Reg rate and rhythm. No murmurs rubs or gallops Chest: CTA bilaterally without wheezes, rales, or rhonchi; no distress Abdomen: Soft, non-tender, non-distended, bowel sounds positive. Extremities: No clubbing, cyanosis, or edema. Pulses are 2+ Skin: Clean and intact wound. No  drainage. Staples out Neuro: right upper with 1-2 to 2+ strength, resting tone 1-2 and synergy pattern. RLE is 1-2 as well with 2-3/4 resting tone. Occasional word finding issues but essentially at baseline cogntively with reasonable insight and awareness. Musculoskeletal:minimal pain at the right HF's and rotators appears improved. Posture appropriate Psych: Pt's affect is appropriate. Pt is cooperative    Assessment/Plan: 1. Functional deficits secondary to R Transcervical femur fracture with THA.  R spastic hemiparesis due to prior CVA which require 3+ hours per day of interdisciplinary therapy in a comprehensive inpatient rehab setting. Physiatrist is providing close team supervision and 24 hour management of active medical problems listed below. Physiatrist and rehab team continue to assess barriers to discharge/monitor patient progress toward functional and medical goals.   Home today. i will see in the office for botox, etc.  FIM: FIM - Bathing Bathing Steps Patient Completed: Chest;Right Arm;Left Arm;Abdomen;Front perineal area;Right upper leg;Left upper leg;Right lower leg (including foot);Left lower leg (including foot) Bathing: 4: Min-Patient completes  8-9 93f 10 parts or 75+ percent  FIM - Upper Body Dressing/Undressing Upper body dressing/undressing steps patient completed: Thread/unthread right sleeve of pullover shirt/dresss;Thread/unthread left sleeve of pullover shirt/dress;Put head through opening of pull over shirt/dress;Pull shirt over trunk Upper body dressing/undressing: 5: Set-up assist to: Obtain clothing/put away FIM - Lower Body Dressing/Undressing Lower body dressing/undressing steps patient completed: Thread/unthread right pants leg;Thread/unthread left pants leg;Pull pants up/down;Don/Doff right sock;Don/Doff right shoe Lower body dressing/undressing: 4: Min-Patient completed 75 plus % of tasks  FIM - Toileting Toileting steps completed by patient: Adjust  clothing prior to toileting;Performs perineal hygiene Toileting Assistive Devices: Grab bar or rail for support Toileting: 3: Mod-Patient completed 2 of 3 steps  FIM - Diplomatic Services operational officer Devices: Elevated toilet seat;Walker Toilet Transfers: 4-To toilet/BSC: Min A (steadying Pt. > 75%)  FIM - Bed/Chair Transfer Bed/Chair Transfer Assistive Devices: Therapist, occupational: 5: Sit > Supine: Supervision (verbal cues/safety issues);5: Supine > Sit: Supervision (verbal cues/safety issues);5: Bed > Chair or W/C: Supervision (verbal cues/safety issues);5: Chair or W/C > Bed: Supervision (verbal cues/safety issues)  FIM - Locomotion: Wheelchair Distance: >200' Locomotion: Wheelchair: 6: Travels 150 ft or more, turns around, maneuvers to table, bed or toilet, negotiates 3% grade: maneuvers on rugs and over door sills independently FIM - Locomotion: Ambulation Locomotion: Ambulation Assistive Devices: Designer, industrial/product Ambulation/Gait Assistance: 5: Supervision Locomotion: Ambulation: 5: Travels 150 ft or more with supervision/safety issues  Comprehension Comprehension Mode: Auditory Comprehension: 5-Understands complex 90% of the time/Cues < 10% of the time  Expression Expression Mode: Verbal Expression: 5-Expresses complex 90% of the time/cues < 10% of the time  Social Interaction Social Interaction: 5-Interacts appropriately 90% of the time - Needs monitoring or encouragement for participation or interaction.  Problem Solving Problem Solving: 5-Solves complex 90% of the time/cues < 10% of the time  Memory Memory: 5-Recognizes or recalls 90% of the time/requires cueing < 10% of the time  Medical Problem List and Plan:  1. DVT Prophylaxis/Anticoagulation: Pharmaceutical: Lovenox.  2. Pain Management: prn hydrocodone effective   -overall improved 3. Mood: Appropriate without signs of distress. Will monitor for now.  4. Neuropsych: This patient is capable of  making decisions on his/her own behalf.  5. ZOX:[id number] Check bid. Off Norvasc but resumed lisinopril 6. CVA with left hemiparesis: old right frontal and left basal ganglia infarcts in 2011. No change at present 7. ABLA:   iron supplement.   -check once more today 8. BLL PNA:abx completed  -no cough. afebrile 9. Tone: PRAFO and WHO nightly  -is at baseline. No  changes at this time. LOS (Days) 22 A FACE TO FACE EVALUATION WAS PERFORMED  Dre Gamino T 02/09/2012, 7:51 AM

## 2012-02-09 NOTE — Progress Notes (Signed)
Social Work  Discharge Note  The overall goal for the admission was met for:   Discharge location: Yes - home with wife  Length of Stay: Yes - 22 days  Discharge activity level: Yes - minimal assistance overall  Home/community participation: Yes  Services provided included: MD, RD, PT, OT, RN, TR, Pharmacy and SW  Financial Services: Medicare and Private Insurance: [employer]  Follow-up services arranged: Outpatient: PT, OT via [facility], DME: rolling walker via [facility] and Patient/Family has no preference for HH/DME agencies  Comments (or additional information):  Patient/Family verbalized understanding of follow-up arrangements: Yes  Individual responsible for coordination of the follow-up plan: patient  Confirmed correct DME delivered: Daryle Boyington 02/09/2012    Rena Sweeden

## 2012-02-09 NOTE — Progress Notes (Signed)
Patient discharged at 1230 with wife to home . Alert and oriented no complaint of pain . Right hip incision healed . Discharge instructions given and reviewed with patient and wife by P. Love ,PA. Patient and wife verbalized  Understanding of discharge  Instructions . Continue with plan of care .                    Richard Kent

## 2012-02-14 ENCOUNTER — Ambulatory Visit: Payer: Medicare Other | Attending: Physical Medicine & Rehabilitation | Admitting: Occupational Therapy

## 2012-02-14 DIAGNOSIS — M25669 Stiffness of unspecified knee, not elsewhere classified: Secondary | ICD-10-CM | POA: Diagnosis not present

## 2012-02-14 DIAGNOSIS — R262 Difficulty in walking, not elsewhere classified: Secondary | ICD-10-CM | POA: Insufficient documentation

## 2012-02-14 DIAGNOSIS — M629 Disorder of muscle, unspecified: Secondary | ICD-10-CM | POA: Diagnosis not present

## 2012-02-14 DIAGNOSIS — M2560 Stiffness of unspecified joint, not elsewhere classified: Secondary | ICD-10-CM | POA: Insufficient documentation

## 2012-02-14 DIAGNOSIS — Z5189 Encounter for other specified aftercare: Secondary | ICD-10-CM | POA: Diagnosis not present

## 2012-02-14 DIAGNOSIS — M2420 Disorder of ligament, unspecified site: Secondary | ICD-10-CM | POA: Insufficient documentation

## 2012-02-14 DIAGNOSIS — I69998 Other sequelae following unspecified cerebrovascular disease: Secondary | ICD-10-CM | POA: Diagnosis not present

## 2012-02-14 DIAGNOSIS — M6281 Muscle weakness (generalized): Secondary | ICD-10-CM | POA: Insufficient documentation

## 2012-02-14 DIAGNOSIS — R279 Unspecified lack of coordination: Secondary | ICD-10-CM | POA: Diagnosis not present

## 2012-02-16 ENCOUNTER — Ambulatory Visit: Payer: Medicare Other | Admitting: Physical Therapy

## 2012-02-20 ENCOUNTER — Encounter: Payer: Self-pay | Admitting: Family Medicine

## 2012-02-20 ENCOUNTER — Ambulatory Visit (INDEPENDENT_AMBULATORY_CARE_PROVIDER_SITE_OTHER): Payer: Medicare Other | Admitting: Family Medicine

## 2012-02-20 VITALS — BP 110/60 | HR 61 | Wt 172.0 lb

## 2012-02-20 DIAGNOSIS — I6523 Occlusion and stenosis of bilateral carotid arteries: Secondary | ICD-10-CM

## 2012-02-20 DIAGNOSIS — I658 Occlusion and stenosis of other precerebral arteries: Secondary | ICD-10-CM

## 2012-02-20 DIAGNOSIS — Z8781 Personal history of (healed) traumatic fracture: Secondary | ICD-10-CM

## 2012-02-20 DIAGNOSIS — I69959 Hemiplegia and hemiparesis following unspecified cerebrovascular disease affecting unspecified side: Secondary | ICD-10-CM

## 2012-02-20 NOTE — Progress Notes (Signed)
  Subjective:    Patient ID: Richard Kent, male    DOB: Jul 27, 1928, 78 y.o.   MRN: [id number]  HPI He is here for followup visit. He was seen recently for a right hip fracture and had surgery on this. He was then sent to a rehabilitation center. He has been home for the last week. Presently he is only using to codeine preparations per day for his hip pain. He complains of very little difficulty with that. His wife is his main caregiver. He also has a history of carotid stenosis which did potentially complicate the surgery however he went through this without difficulty. Presently he is having no new neurologic symptoms. He continues on medications listed in the chart.   Review of Systems     Objective:   Physical Exam Alert and in no distress. No carotid bruits noted. Cardiac exam shows regular rhythm without murmurs or gallops. Hip motion causes no discomfort.       Assessment & Plan:   1. History of fracture of right hip   2. CVA WITH LEFT HEMIPARESIS   3. Carotid stenosis, bilateral    continue on present medication regimen. His wife will continue to be his main caregiver.

## 2012-02-21 DIAGNOSIS — S72033A Displaced midcervical fracture of unspecified femur, initial encounter for closed fracture: Secondary | ICD-10-CM | POA: Diagnosis not present

## 2012-02-22 ENCOUNTER — Ambulatory Visit: Payer: Medicare Other | Attending: Family Medicine | Admitting: Physical Therapy

## 2012-02-22 ENCOUNTER — Ambulatory Visit: Payer: Medicare Other | Admitting: Occupational Therapy

## 2012-02-22 DIAGNOSIS — R269 Unspecified abnormalities of gait and mobility: Secondary | ICD-10-CM | POA: Diagnosis not present

## 2012-02-22 DIAGNOSIS — IMO0001 Reserved for inherently not codable concepts without codable children: Secondary | ICD-10-CM | POA: Diagnosis not present

## 2012-02-23 NOTE — Discharge Summary (Signed)
Richard Kent, [name] NO.:  [id number]  MEDICAL RECORD NO.:  [id number]  LOCATION:  4039                         FACILITY:  [facility]  PHYSICIAN:  Ranelle Oyster, M.D.DATE OF BIRTH:  03/26/1929  DATE OF ADMISSION:  01/18/2012 DATE OF DISCHARGE:  02/09/2012                              DISCHARGE SUMMARY   DISCHARGE DIAGNOSES: 1. Right subcapital femoral fracture with hemiarthroplasty. 2. Hypertension. 3. Bilateral pneumonia, treated. 4. Acute blood loss anemia. 5. Cerebrovascular accident with history of left spastic hemiparesis.  HISTORY OF PRESENT ILLNESS:  Mr. Richard Kent is an 78 year old male with history of CVA with residual right hemiparesis, who was admitted on January 13, 2012, with a ground-level fall and right hip pain due to displaced right subcapital femoral neck fracture.  He was cleared for surgery and underwent right hip hemiarthroplasty by Dr. Carola Frost.  Postop, he is weightbearing as tolerated.  He was noted to have bilateral PNA at admission and has been treated with IV antibiotics.  He is also noted to have issues with some confusion, question due to narcotics as well as impairments in his mobility and worsening of right lower extremity spasticity.  He was evaluated by Therapy Team and CIR was recommended for progression.  PAST MEDICAL HISTORY: 1. Hypertension. 2. CVA. 3. Diverticulosis. 4. Carotid artery stenosis. 5. Renal calculi.  FUNCTIONAL HISTORY:  The patient was independent with hemi-walker and gait belt for mobility.  He needed some assistance with ADLs.  Used cane for ambulation.  FUNCTIONAL STATUS:  The patient was min assist for bed mobility, mod assist 40% for transfers, and ambulation not tested.  HOSPITAL COURSE:  Mr. Quandre Polinski was admitted to rehab on January 18, 2012, for inpatient therapies to consist of PT, OT at least 3 hours 5 days a week.  Past admission, physiatrist, rehab, RN, and therapy team have worked  together to provide customized collaborative interdisciplinary care.  His blood pressures were checked on b.i.d. basis, and he was monitored off Norvasc and lisinopril initially.  As blood pressure started trending upwards, lisinopril was resumed.  The patient's confusion has resolved.  His spasticity was greatly improved with use of PRAFO as well as hand orthosis use at night.  His acute blood loss anemia was monitored.  Last check of labs from February 08, 2012, revealed hemoglobin 13.1, hematocrit 38.9, white count 5.4, platelets 279.  Check of lytes revealed sodium 136, potassium 4.1, chloride 101, CO2 of 30, BUN 19, creatinine 0.77, glucose 103.  During the patient's stay in rehab, team conferences were held to monitor the patient's progress, set goals, as well as discuss barriers to discharge.  Physical Therapy has worked with the patient on mobility and strengthening.  The patient was able to ambulate 200 feet with rolling walker at supervision.  He did continue to require cues for posture and safe alignment.  He was able to perform car transfers with min assist with verbal cues for positioning.  He was modified independent for wheelchair mobility.  OT has worked with the patient on self-care tasks.  The patient made steady progress to being at min assist for bathing, lower body dressing, toilet and shower transfers, as well  as toileting.  He was able to perform upper body dressing and grooming at supervision level and was modified independent for eating tasks.  He did occasionally require verbal cues for safety awareness, especially with sit-to-stand transfers.  Family education was done with wife, who is very supportive and will provide min assist to supervision past discharge.  On February 09, 2012, the patient is discharged to home.  DISCHARGE MEDICATIONS: 1. Lisinopril 10 mg p.o. per day. 2. Aspirin 81 mg p.o. per day. 3. Baclofen 10 mg p.o. t.i.d. 4. Plavix 75 mg p.o.  per day. 5. Multivitamin 1 per day. 6. Senokot-S 2 p.o. at bedtime. 7. Zocor 20 mg p.o. per day. 8. Zanaflex 2 mg p.o. b.i.d. 9. Vitamin C 1 p.o. per day. 10. Norco 5/325 mg 1-2 p.o. Every 8 hours as needed for pain. Rx#45 DIET:  Regular.  ACTIVITY LEVEL:  A 24-hour supervision assistance.  SPECIAL INSTRUCTIONS:  Walk with walker.  Outpatient PT, OT to begin at [facility] beginning February 14, 2012.  Continue right posterior hip precautions.  FOLLOWUP:  The patient to follow up with Dr. Carola Frost for postop check in the next 1-2 weeks.  Follow up with Dr. Faith Rogue, on March 04, 2012, at 11:30 a.m.  Follow up with Dr. Sharlot Gowda, on February 20, 2012, at 2:00 p.m.     Delle Reining, P.A.   ______________________________ Ranelle Oyster, M.D.    PL/MEDQ  D:  02/22/2012  T:  02/23/2012  Job:  [id number]  cc:   Doralee Albino. Carola Frost, M.D. Sharlot Gowda, M.D.

## 2012-02-27 ENCOUNTER — Ambulatory Visit: Payer: Medicare Other | Admitting: Physical Therapy

## 2012-02-27 ENCOUNTER — Encounter: Payer: Medicare Other | Admitting: Occupational Therapy

## 2012-02-27 ENCOUNTER — Ambulatory Visit: Payer: Medicare Other | Admitting: Occupational Therapy

## 2012-02-27 DIAGNOSIS — R269 Unspecified abnormalities of gait and mobility: Secondary | ICD-10-CM | POA: Diagnosis not present

## 2012-02-27 DIAGNOSIS — IMO0001 Reserved for inherently not codable concepts without codable children: Secondary | ICD-10-CM | POA: Diagnosis not present

## 2012-03-01 ENCOUNTER — Ambulatory Visit: Payer: Medicare Other | Admitting: Physical Therapy

## 2012-03-01 ENCOUNTER — Ambulatory Visit: Payer: Medicare Other | Admitting: Occupational Therapy

## 2012-03-01 DIAGNOSIS — R269 Unspecified abnormalities of gait and mobility: Secondary | ICD-10-CM | POA: Diagnosis not present

## 2012-03-01 DIAGNOSIS — IMO0001 Reserved for inherently not codable concepts without codable children: Secondary | ICD-10-CM | POA: Diagnosis not present

## 2012-03-04 ENCOUNTER — Encounter: Payer: Medicare Other | Attending: Physical Medicine & Rehabilitation | Admitting: Physical Medicine & Rehabilitation

## 2012-03-04 ENCOUNTER — Encounter: Payer: Self-pay | Admitting: Physical Medicine & Rehabilitation

## 2012-03-04 VITALS — BP 113/66 | HR 62 | Resp 14 | Ht 70.0 in | Wt 172.0 lb

## 2012-03-04 DIAGNOSIS — S72009A Fracture of unspecified part of neck of unspecified femur, initial encounter for closed fracture: Secondary | ICD-10-CM | POA: Diagnosis not present

## 2012-03-04 DIAGNOSIS — X58XXXA Exposure to other specified factors, initial encounter: Secondary | ICD-10-CM | POA: Insufficient documentation

## 2012-03-04 DIAGNOSIS — J189 Pneumonia, unspecified organism: Secondary | ICD-10-CM | POA: Insufficient documentation

## 2012-03-04 DIAGNOSIS — I10 Essential (primary) hypertension: Secondary | ICD-10-CM | POA: Insufficient documentation

## 2012-03-04 DIAGNOSIS — D62 Acute posthemorrhagic anemia: Secondary | ICD-10-CM | POA: Insufficient documentation

## 2012-03-04 DIAGNOSIS — G8110 Spastic hemiplegia affecting unspecified side: Secondary | ICD-10-CM | POA: Diagnosis not present

## 2012-03-04 DIAGNOSIS — S72001A Fracture of unspecified part of neck of right femur, initial encounter for closed fracture: Secondary | ICD-10-CM

## 2012-03-04 DIAGNOSIS — I69959 Hemiplegia and hemiparesis following unspecified cerebrovascular disease affecting unspecified side: Secondary | ICD-10-CM | POA: Insufficient documentation

## 2012-03-04 MED ORDER — BACLOFEN 20 MG PO TABS: 20.0000 mg | ORAL_TABLET | Freq: Three times a day (TID) | ORAL | Status: DC

## 2012-03-04 NOTE — Patient Instructions (Signed)
BACLOFEN: WEEK ONE-  20MG  AT NIGHT AND 10MG  IN AM AND AFTERNOON WEEK TWO- 20MG  AT NIGHT AND AM, AND 10MG  IN AFTERNOON WEEK THREE AND ON- TAKE 20MG  THREE X PER DAY  IF HE BEGINS TO BECOME TOO SEDATED, BACK DOWN TO THE PREVIOUS LEVEL YOU WERE AT

## 2012-03-04 NOTE — Progress Notes (Signed)
Subjective:    Patient ID: Richard Kent, male    DOB: [birth year], 78 y.o.   MRN: [id number]  HPI  Richard Kent is back regarding his left hip fx and spastic right HP.  He is going to outpt therapy at [facility]. They are working on gait, some spasticity mgt, ROM. He is working with OT and PT. His pain is pretty well controlled. He is sleeping, eating, voiding well. His pain is gradually improving at the right hip and groin although it remains tender at times.  He sees Dr. Terrace Arabia next week for botox injections, likely to his right arm.  He is currently using baclofen 10mg  tid and zanaflex 2mg  tid.  He has no ill effects from these meds.  Pain Inventory Average Pain 3 Pain Right Now 0 My pain is intermittent and aching  In the last 24 hours, has pain interfered with the following? General activity 0 Relation with others 0 Enjoyment of life 1 What TIME of day is your pain at its worst? evening Sleep (in general) Good  Pain is worse with: some activites Pain improves with: therapy/exercise Relief from Meds: 10  Mobility walk with assistance use a cane use a walker how many minutes can you walk? 5 ability to climb steps?  yes do you drive?  no use a wheelchair needs help with transfers  Function not employed: date last employed  I need assistance with the following:  dressing, bathing and toileting  Neuro/Psych No problems in this area  Prior Studies Any changes since last visit?  no  Physicians involved in your care Any changes since last visit?  no   Family History  Problem Relation Age of Onset  . Cancer Mother   . Stroke Mother   . Heart disease Father    History   Social History  . Marital Status: Married    Spouse Name: N/A    Number of Children: N/A  . Years of Education: N/A   Social History Main Topics  . Smoking status: Former Smoker -- 2.0 packs/day for 20 years    Types: Cigarettes    Quit date: 01/13/1972  . Smokeless tobacco: Never Used    . Alcohol Use: No  . Drug Use: No  . Sexually Active: Not Currently   Other Topics Concern  . None   Social History Narrative  . None   Past Surgical History  Procedure Date  . Hernia repair     RIGHT  . Appendectomy   . Hip arthroplasty 01/15/2012    Procedure: ARTHROPLASTY BIPOLAR HIP;  Surgeon: Budd Palmer, MD;  Location[facility] OR;  Service: Orthopedics;  Laterality: Right;   Past Medical History  Diagnosis Date  . Hypertension   . CVA (cerebral infarction) 2011  . Diverticulosis   . Smoker   . Carotid stenosis   . Kidney stone   . Stroke 11/17/09   BP 113/66  Pulse 62  Resp 14  Ht 5\' 10"  (1.778 m)  Wt 172 lb (78.019 kg)  BMI 24.68 kg/m2  SpO2 98%     Review of Systems  Musculoskeletal: Positive for myalgias, arthralgias and gait problem.  All other systems reviewed and are negative.       Objective:   Physical Exam  General: Alert and oriented x 3, No apparent distress HEENT: Head is normocephalic, atraumatic, PERRLA, EOMI, sclera anicteric, oral mucosa pink and moist, dentition intact, ext ear canals clear,  Neck: Supple without JVD or lymphadenopathy Heart: Reg  rate and rhythm. No murmurs rubs or gallops Chest: CTA bilaterally without wheezes, rales, or rhonchi; no distress Abdomen: Soft, non-tender, non-distended, bowel sounds positive. Extremities: No clubbing, cyanosis, or edema. Pulses are 2+ Skin: Clean and intact without signs of breakdown Neuro: Pt is cognitively appropriate with normal insight, memory, and awareness. Mild right central 7. Memory is still poor. Right upper extremity tone 2 at the pec maj and minor, biceps, brachioradialis, FF's, and wrist. Trace tone noted at the right hip in the hip flexors and adductors. Right ankle remains tight as well, but i did not remove his AFO. Musculoskeletal: he had no pain with ROM of the hip today but did have some discomfort when i palpated the hip deeply into the inguinal region.Psych: Pt's affect  is appropriate. Pt is cooperative         Assessment & Plan:  Assessment: 1. Right subcapital femoral fracture with hemiarthroplasty.  2. Hypertension.  3. Bilateral pneumonia, treated.  4. Acute blood loss anemia.  5. Cerebrovascular accident with history of left spastic hemiparesis   Plan: 1. Would recommend that he continue with outpt PT and OT to address ROM, gait, strengthening, spasticity mgt. Need to be somewhat careful with ROM at the right leg given his recent THA. 2. Titrate baclofen ultimately to 20mg  tid paying close attention to arousal, neurosedating side effects. Keep zanaflex at same dose. 3. Might benefit from botox to hip adductors, flexors, but would prefer to work on therapeutic options with PT first. He is scheduled for botox with [facility], probably to his RUE, which I instructed the patient to follow through with. I would be happy to further manage his botox going forward if he would like. 4. F/u with me in about 2 months. 30 minutes of face to face patient care time were spent during this visit. All questions were encouraged and answered.

## 2012-03-05 ENCOUNTER — Encounter: Payer: Medicare Other | Admitting: Occupational Therapy

## 2012-03-05 ENCOUNTER — Ambulatory Visit: Payer: Medicare Other | Admitting: Physical Therapy

## 2012-03-05 ENCOUNTER — Ambulatory Visit: Payer: Medicare Other | Admitting: Occupational Therapy

## 2012-03-05 DIAGNOSIS — IMO0001 Reserved for inherently not codable concepts without codable children: Secondary | ICD-10-CM | POA: Diagnosis not present

## 2012-03-05 DIAGNOSIS — R269 Unspecified abnormalities of gait and mobility: Secondary | ICD-10-CM | POA: Diagnosis not present

## 2012-03-07 ENCOUNTER — Ambulatory Visit: Payer: Medicare Other | Admitting: Occupational Therapy

## 2012-03-07 ENCOUNTER — Ambulatory Visit: Payer: Medicare Other | Admitting: Physical Therapy

## 2012-03-07 DIAGNOSIS — IMO0001 Reserved for inherently not codable concepts without codable children: Secondary | ICD-10-CM | POA: Diagnosis not present

## 2012-03-07 DIAGNOSIS — R269 Unspecified abnormalities of gait and mobility: Secondary | ICD-10-CM | POA: Diagnosis not present

## 2012-03-11 ENCOUNTER — Ambulatory Visit: Payer: Medicare Other | Admitting: Physical Therapy

## 2012-03-11 ENCOUNTER — Ambulatory Visit: Payer: Medicare Other | Admitting: Occupational Therapy

## 2012-03-11 DIAGNOSIS — R269 Unspecified abnormalities of gait and mobility: Secondary | ICD-10-CM | POA: Diagnosis not present

## 2012-03-11 DIAGNOSIS — G8110 Spastic hemiplegia affecting unspecified side: Secondary | ICD-10-CM | POA: Diagnosis not present

## 2012-03-11 DIAGNOSIS — IMO0001 Reserved for inherently not codable concepts without codable children: Secondary | ICD-10-CM | POA: Diagnosis not present

## 2012-03-15 ENCOUNTER — Ambulatory Visit: Payer: Medicare Other | Admitting: Rehabilitative and Restorative Service Providers"

## 2012-03-15 ENCOUNTER — Ambulatory Visit: Payer: Medicare Other | Admitting: Occupational Therapy

## 2012-03-15 DIAGNOSIS — IMO0001 Reserved for inherently not codable concepts without codable children: Secondary | ICD-10-CM | POA: Diagnosis not present

## 2012-03-15 DIAGNOSIS — R269 Unspecified abnormalities of gait and mobility: Secondary | ICD-10-CM | POA: Diagnosis not present

## 2012-03-18 ENCOUNTER — Ambulatory Visit: Payer: Medicare Other | Admitting: Occupational Therapy

## 2012-03-18 ENCOUNTER — Ambulatory Visit: Payer: Medicare Other | Admitting: Physical Therapy

## 2012-03-18 DIAGNOSIS — IMO0001 Reserved for inherently not codable concepts without codable children: Secondary | ICD-10-CM | POA: Diagnosis not present

## 2012-03-18 DIAGNOSIS — R269 Unspecified abnormalities of gait and mobility: Secondary | ICD-10-CM | POA: Diagnosis not present

## 2012-03-22 ENCOUNTER — Ambulatory Visit: Payer: Medicare Other | Admitting: Occupational Therapy

## 2012-03-22 ENCOUNTER — Ambulatory Visit: Payer: Medicare Other | Attending: Physical Medicine & Rehabilitation | Admitting: Physical Therapy

## 2012-03-22 DIAGNOSIS — R279 Unspecified lack of coordination: Secondary | ICD-10-CM | POA: Diagnosis not present

## 2012-03-22 DIAGNOSIS — R262 Difficulty in walking, not elsewhere classified: Secondary | ICD-10-CM | POA: Diagnosis not present

## 2012-03-22 DIAGNOSIS — M25669 Stiffness of unspecified knee, not elsewhere classified: Secondary | ICD-10-CM | POA: Diagnosis not present

## 2012-03-22 DIAGNOSIS — M6281 Muscle weakness (generalized): Secondary | ICD-10-CM | POA: Diagnosis not present

## 2012-03-22 DIAGNOSIS — M2560 Stiffness of unspecified joint, not elsewhere classified: Secondary | ICD-10-CM | POA: Insufficient documentation

## 2012-03-22 DIAGNOSIS — Z5189 Encounter for other specified aftercare: Secondary | ICD-10-CM | POA: Insufficient documentation

## 2012-03-22 DIAGNOSIS — M629 Disorder of muscle, unspecified: Secondary | ICD-10-CM | POA: Insufficient documentation

## 2012-03-22 DIAGNOSIS — I69998 Other sequelae following unspecified cerebrovascular disease: Secondary | ICD-10-CM | POA: Insufficient documentation

## 2012-03-22 DIAGNOSIS — M2420 Disorder of ligament, unspecified site: Secondary | ICD-10-CM | POA: Insufficient documentation

## 2012-03-26 ENCOUNTER — Ambulatory Visit: Payer: Medicare Other | Admitting: Physical Therapy

## 2012-03-26 ENCOUNTER — Ambulatory Visit: Payer: Medicare Other | Admitting: Occupational Therapy

## 2012-03-27 ENCOUNTER — Ambulatory Visit: Payer: Medicare Other | Admitting: Occupational Therapy

## 2012-03-27 ENCOUNTER — Ambulatory Visit: Payer: Medicare Other | Admitting: Physical Therapy

## 2012-03-27 DIAGNOSIS — S72033A Displaced midcervical fracture of unspecified femur, initial encounter for closed fracture: Secondary | ICD-10-CM | POA: Diagnosis not present

## 2012-04-03 ENCOUNTER — Ambulatory Visit: Payer: Medicare Other | Admitting: Physical Therapy

## 2012-04-03 ENCOUNTER — Ambulatory Visit: Payer: Medicare Other | Admitting: Occupational Therapy

## 2012-04-05 ENCOUNTER — Ambulatory Visit: Payer: Medicare Other | Admitting: Physical Therapy

## 2012-04-05 ENCOUNTER — Ambulatory Visit: Payer: Medicare Other | Admitting: Occupational Therapy

## 2012-04-09 ENCOUNTER — Ambulatory Visit: Payer: Medicare Other | Admitting: Occupational Therapy

## 2012-04-09 ENCOUNTER — Ambulatory Visit: Payer: Medicare Other | Admitting: Physical Therapy

## 2012-04-12 ENCOUNTER — Ambulatory Visit: Payer: Medicare Other | Admitting: Physical Therapy

## 2012-04-12 ENCOUNTER — Ambulatory Visit: Payer: Medicare Other | Admitting: Occupational Therapy

## 2012-04-15 ENCOUNTER — Ambulatory Visit: Payer: Medicare Other | Admitting: Physical Therapy

## 2012-04-16 ENCOUNTER — Ambulatory Visit: Payer: Medicare Other | Admitting: Physical Therapy

## 2012-04-22 ENCOUNTER — Ambulatory Visit: Payer: Medicare Other | Attending: Physical Medicine & Rehabilitation | Admitting: Physical Therapy

## 2012-04-22 DIAGNOSIS — M2420 Disorder of ligament, unspecified site: Secondary | ICD-10-CM | POA: Insufficient documentation

## 2012-04-22 DIAGNOSIS — R279 Unspecified lack of coordination: Secondary | ICD-10-CM | POA: Diagnosis not present

## 2012-04-22 DIAGNOSIS — M2560 Stiffness of unspecified joint, not elsewhere classified: Secondary | ICD-10-CM | POA: Diagnosis not present

## 2012-04-22 DIAGNOSIS — I69998 Other sequelae following unspecified cerebrovascular disease: Secondary | ICD-10-CM | POA: Insufficient documentation

## 2012-04-22 DIAGNOSIS — M629 Disorder of muscle, unspecified: Secondary | ICD-10-CM | POA: Diagnosis not present

## 2012-04-22 DIAGNOSIS — R262 Difficulty in walking, not elsewhere classified: Secondary | ICD-10-CM | POA: Diagnosis not present

## 2012-04-22 DIAGNOSIS — M25669 Stiffness of unspecified knee, not elsewhere classified: Secondary | ICD-10-CM | POA: Diagnosis not present

## 2012-04-22 DIAGNOSIS — M6281 Muscle weakness (generalized): Secondary | ICD-10-CM | POA: Diagnosis not present

## 2012-04-22 DIAGNOSIS — Z5189 Encounter for other specified aftercare: Secondary | ICD-10-CM | POA: Diagnosis not present

## 2012-04-23 ENCOUNTER — Other Ambulatory Visit: Payer: Self-pay

## 2012-04-23 MED ORDER — CLOPIDOGREL BISULFATE 75 MG PO TABS: 75.0000 mg | ORAL_TABLET | Freq: Every morning | ORAL | Status: DC

## 2012-04-23 MED ORDER — SIMVASTATIN 20 MG PO TABS: 20.0000 mg | ORAL_TABLET | Freq: Every evening | ORAL | Status: DC

## 2012-04-23 NOTE — Telephone Encounter (Signed)
Sent in meds 

## 2012-04-24 DIAGNOSIS — IMO0001 Reserved for inherently not codable concepts without codable children: Secondary | ICD-10-CM | POA: Diagnosis not present

## 2012-04-24 DIAGNOSIS — S72033A Displaced midcervical fracture of unspecified femur, initial encounter for closed fracture: Secondary | ICD-10-CM | POA: Diagnosis not present

## 2012-04-25 ENCOUNTER — Ambulatory Visit: Payer: Medicare Other | Admitting: Physical Therapy

## 2012-04-26 ENCOUNTER — Telehealth: Payer: Self-pay | Admitting: Family Medicine

## 2012-04-26 MED ORDER — LISINOPRIL 10 MG PO TABS: 10.0000 mg | ORAL_TABLET | Freq: Every day | ORAL | Status: DC

## 2012-04-26 NOTE — Telephone Encounter (Signed)
done

## 2012-04-29 ENCOUNTER — Ambulatory Visit: Payer: Medicare Other | Admitting: Physical Therapy

## 2012-05-02 ENCOUNTER — Telehealth: Payer: Self-pay | Admitting: Internal Medicine

## 2012-05-02 ENCOUNTER — Ambulatory Visit: Payer: Medicare Other | Admitting: Physical Therapy

## 2012-05-02 MED ORDER — AMLODIPINE BESYLATE 10 MG PO TABS: 10.0000 mg | ORAL_TABLET | Freq: Every day | ORAL | Status: DC

## 2012-05-02 NOTE — Telephone Encounter (Signed)
Sent in [facility]

## 2012-05-03 ENCOUNTER — Encounter: Payer: Medicare Other | Attending: Physical Medicine & Rehabilitation | Admitting: Physical Medicine & Rehabilitation

## 2012-05-03 ENCOUNTER — Encounter: Payer: Self-pay | Admitting: Physical Medicine & Rehabilitation

## 2012-05-03 VITALS — BP 129/54 | HR 79 | Resp 14 | Ht 70.0 in | Wt 173.5 lb

## 2012-05-03 DIAGNOSIS — I69959 Hemiplegia and hemiparesis following unspecified cerebrovascular disease affecting unspecified side: Secondary | ICD-10-CM | POA: Diagnosis not present

## 2012-05-03 DIAGNOSIS — D62 Acute posthemorrhagic anemia: Secondary | ICD-10-CM | POA: Diagnosis not present

## 2012-05-03 DIAGNOSIS — S72001A Fracture of unspecified part of neck of right femur, initial encounter for closed fracture: Secondary | ICD-10-CM

## 2012-05-03 DIAGNOSIS — J189 Pneumonia, unspecified organism: Secondary | ICD-10-CM | POA: Diagnosis not present

## 2012-05-03 DIAGNOSIS — S72009A Fracture of unspecified part of neck of unspecified femur, initial encounter for closed fracture: Secondary | ICD-10-CM

## 2012-05-03 DIAGNOSIS — X58XXXA Exposure to other specified factors, initial encounter: Secondary | ICD-10-CM | POA: Insufficient documentation

## 2012-05-03 DIAGNOSIS — I10 Essential (primary) hypertension: Secondary | ICD-10-CM | POA: Diagnosis not present

## 2012-05-03 DIAGNOSIS — G8110 Spastic hemiplegia affecting unspecified side: Secondary | ICD-10-CM | POA: Diagnosis not present

## 2012-05-03 NOTE — Patient Instructions (Signed)
CALL ME WITH QUESTIONS. CONTINUE WITH YOUR HOME EXERCISES!!

## 2012-05-03 NOTE — Progress Notes (Signed)
Subjective:    Patient ID: Richard Kent, male    DOB: March 15, 1929, 78 y.o.   MRN: [id number]  HPI  Mr. Hinchman is back regarding his spasticty and gait issues. He feels that he has benefited from a balance and gait standpoint. He tried the treadmill, but it was too dangerous for him. He could not tolerate the higher dose of baclofen due to side effects. Dr. Terrace Arabia performed botox injections to his right wrist and FF's in October which was helpful. He is about to finish up with PT,OT   Pain Inventory Average Pain 2 Pain Right Now 0 My pain is intermittent and unsure  In the last 24 hours, has pain interfered with the following? General activity 0 Relation with others 0 Enjoyment of life 0 What TIME of day is your pain at its worst? no pain Sleep (in general) Fair  Pain is worse with: unsure Pain improves with: therapy/exercise Relief from Meds: 10  Mobility walk with assistance use a cane use a walker how many minutes can you walk? 30 ability to climb steps?  yes do you drive?  no use a wheelchair needs help with transfers  Function retired I need assistance with the following:  dressing, bathing and toileting  Neuro/Psych No problems in this area  Prior Studies Any changes since last visit?  no  Physicians involved in your care Any changes since last visit?  no   Family History  Problem Relation Age of Onset  . Cancer Mother   . Stroke Mother   . Heart disease Father    History   Social History  . Marital Status: Married    Spouse Name: N/A    Number of Children: N/A  . Years of Education: N/A   Social History Main Topics  . Smoking status: Former Smoker -- 2.0 packs/day for 20 years    Types: Cigarettes    Quit date: 01/13/1972  . Smokeless tobacco: Never Used  . Alcohol Use: No  . Drug Use: No  . Sexually Active: Not Currently   Other Topics Concern  . None   Social History Narrative  . None   Past Surgical History  Procedure Date  .  Hernia repair     RIGHT  . Appendectomy   . Hip arthroplasty 01/15/2012    Procedure: ARTHROPLASTY BIPOLAR HIP;  Surgeon: Budd Palmer, MD;  Location: [facility] OR;  Service: Orthopedics;  Laterality: Right;   Past Medical History  Diagnosis Date  . Hypertension   . CVA (cerebral infarction) 2011  . Diverticulosis   . Smoker   . Carotid stenosis   . Kidney stone   . Stroke 11/17/09   BP 129/54  Pulse 79  Resp 14  Ht 5\' 10"  (1.778 m)  Wt 173 lb 8 oz (78.699 kg)  BMI 24.89 kg/m2  SpO2 96%    Review of Systems  Musculoskeletal: Positive for gait problem.  All other systems reviewed and are negative.       Objective:   Physical Exam General: Alert and oriented x 3, No apparent distress  HEENT: Head is normocephalic, atraumatic, PERRLA, EOMI, sclera anicteric, oral mucosa pink and moist, dentition intact, ext ear canals clear,  Neck: Supple without JVD or lymphadenopathy  Heart: Reg rate and rhythm. No murmurs rubs or gallops  Chest: CTA bilaterally without wheezes, rales, or rhonchi; no distress  Abdomen: Soft, non-tender, non-distended, bowel sounds positive.  Extremities: No clubbing, cyanosis, or edema. Pulses are 2+  Skin: Clean and  intact without signs of breakdown  Neuro: Pt is cognitively appropriate with normal insight, memory, and awareness. Mild right central 7. Memory is still poor. Right upper extremity tone 2 at the pec maj and minor, biceps, brachioradialis. Right FF and WF are 1/4.Marland Kitchen Trace tone noted at the right hip in the hip flexors and adductors. Right ankle remains tight as well, but i did not remove his AFO.  Musculoskeletal: he had no pain with ROM of the hip today but did have some discomfort when i palpated the hip deeply into the inguinal region. Psych: Pt's affect is appropriate. Pt is cooperative   Assessment & Plan:   Assessment:  1. Right subcapital femoral fracture with hemiarthroplasty.  2. Hypertension.  3. Bilateral pneumonia, treated.  4.  Acute blood loss anemia.  5. Cerebrovascular accident with history of left spastic hemiparesis   Plan:  1. Continue with exercise once dc'ed from PT. I think he might do well with exercise in the water at the [facility].  2. Continue baclofen at low does 10mg  tid. Keep zanaflex at same dose.  3. Might benefit from botox to hip adductors, flexors, but would prefer to work on therapeutic options with PT first. I would be happy to assist with these in any way. At this point it is redundant for both me and Dr. Terrace Arabia to manage his spasticity.   4. F/u with me prn. All questions were encouraged and answered.

## 2012-05-07 ENCOUNTER — Ambulatory Visit: Payer: Medicare Other | Admitting: Physical Therapy

## 2012-05-09 ENCOUNTER — Ambulatory Visit: Payer: Medicare Other | Admitting: Physical Therapy

## 2012-05-13 ENCOUNTER — Ambulatory Visit: Payer: Medicare Other | Admitting: Physical Therapy

## 2012-05-24 ENCOUNTER — Other Ambulatory Visit: Payer: Self-pay | Admitting: Family Medicine

## 2012-05-24 NOTE — Telephone Encounter (Signed)
Is this ok?

## 2012-06-06 ENCOUNTER — Other Ambulatory Visit: Payer: Self-pay | Admitting: Family Medicine

## 2012-06-06 NOTE — Telephone Encounter (Signed)
renew

## 2012-06-06 NOTE — Telephone Encounter (Signed)
IS THIS OK 

## 2012-07-03 DIAGNOSIS — S72033A Displaced midcervical fracture of unspecified femur, initial encounter for closed fracture: Secondary | ICD-10-CM | POA: Diagnosis not present

## 2012-07-03 DIAGNOSIS — IMO0001 Reserved for inherently not codable concepts without codable children: Secondary | ICD-10-CM | POA: Diagnosis not present

## 2012-07-29 ENCOUNTER — Other Ambulatory Visit: Payer: Self-pay | Admitting: Family Medicine

## 2012-08-22 ENCOUNTER — Other Ambulatory Visit: Payer: Self-pay | Admitting: Family Medicine

## 2012-08-24 IMAGING — CT CT ANGIO CHEST
2 of 6 series · 19 of 46 positions shown · IV contrast (APPLIED)
Comparison: Chest radiographs 01/12/2012 and earlier.

CLINICAL DATA: 82-year-old male.  Right side weakness.  Right hip
fracture.  Hypoxia.

CT ANGIOGRAPHY CHEST
TECHNIQUE: Multidetector CT imaging of the chest using the
standard protocol during bolus administration of intravenous
contrast. Multiplanar reconstructed images including MIPs were
obtained and reviewed to evaluate the vascular anatomy.
Contrast:  100 ml Omnipaque three-phase D.

[Series 6: pulm embolism 1.0 b25f thin · axial · 0.71mm/px · z∈[+1234,+1506]mm · 16 of 301 slices shown]
[im 14/301  lung]
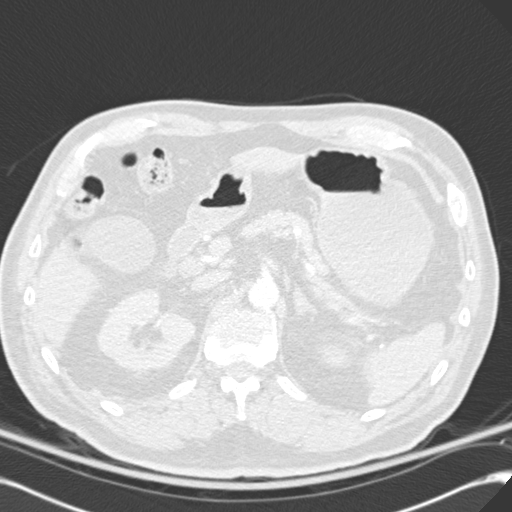
[im 40/301  soft-tissue]
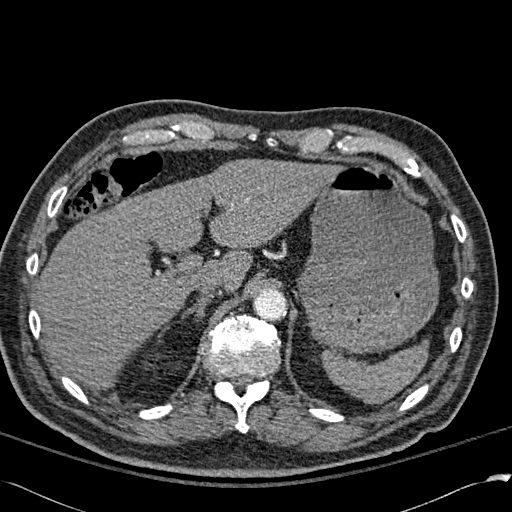
[im 53/301  lung]
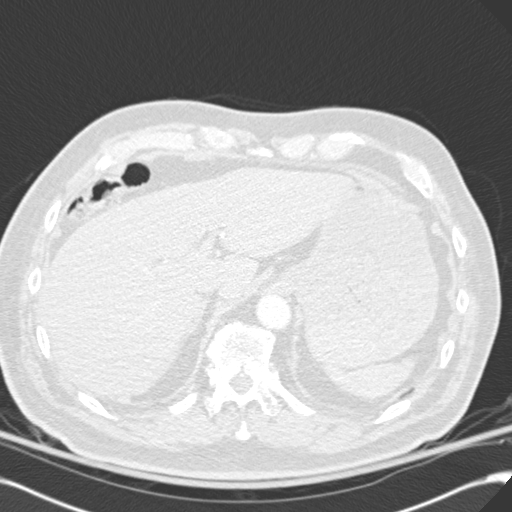
[im 66/301  soft-tissue]
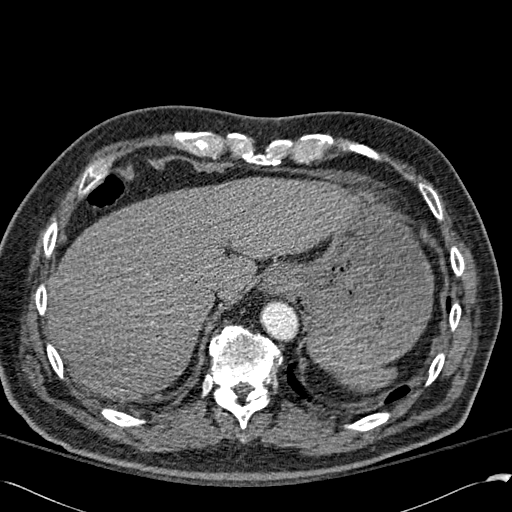
[im 92/301  lung]
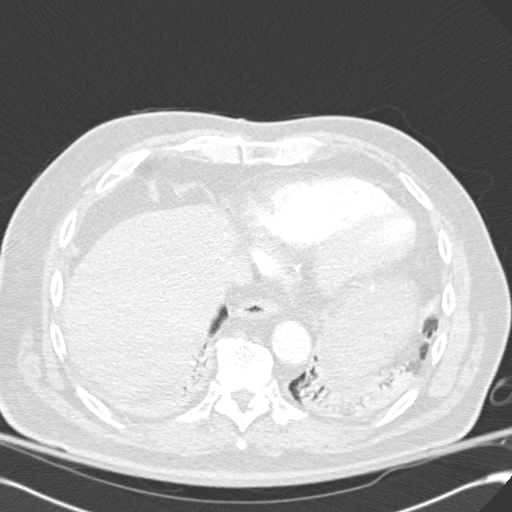
[im 105/301  soft-tissue]
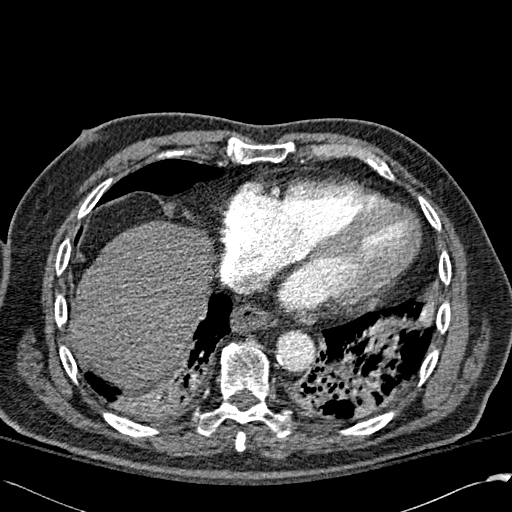
[im 118/301  lung]
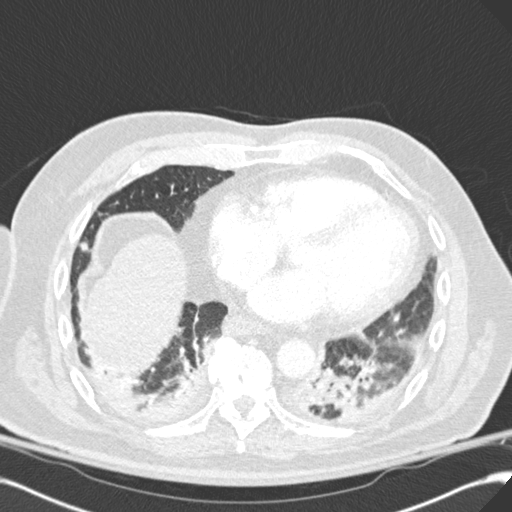
[im 144/301  soft-tissue]
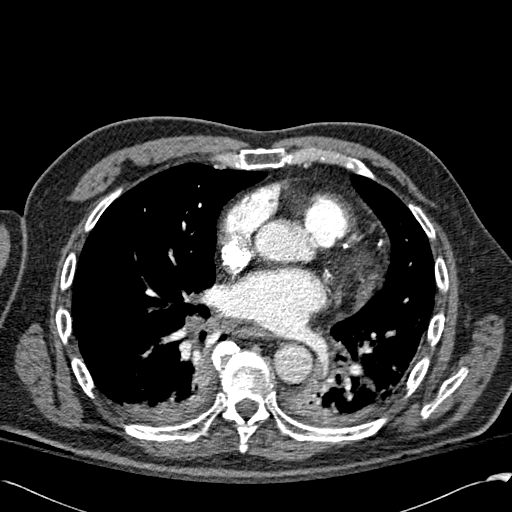
[im 157/301  lung]
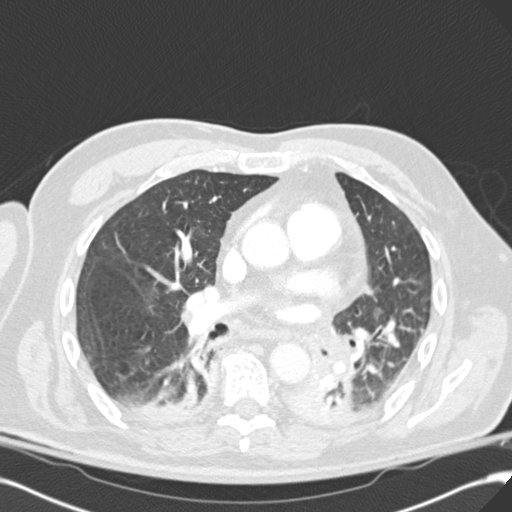
[im 183/301  soft-tissue]
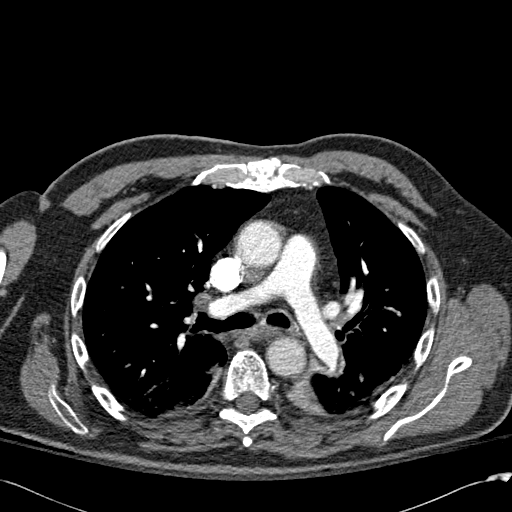
[im 196/301  lung]
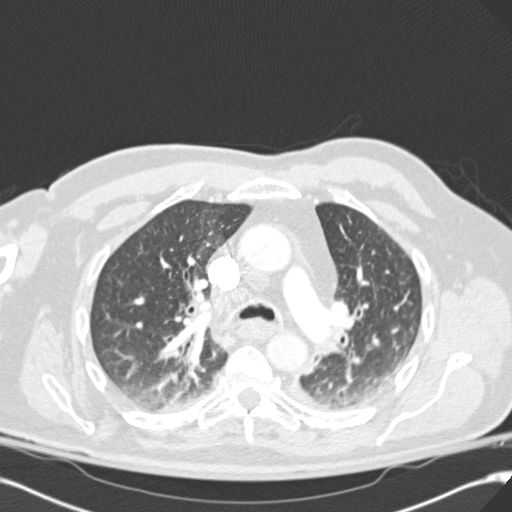
[im 209/301  soft-tissue]
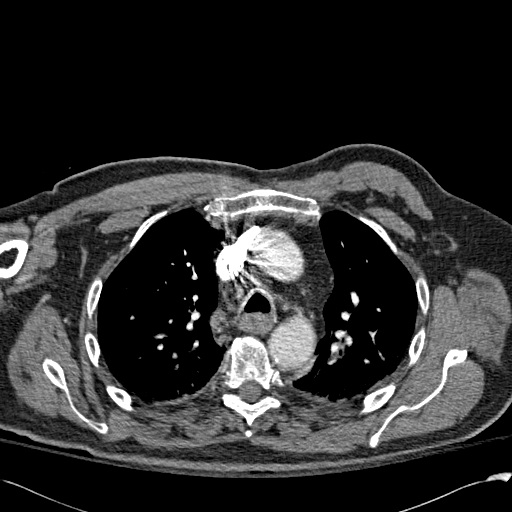
[im 235/301  lung]
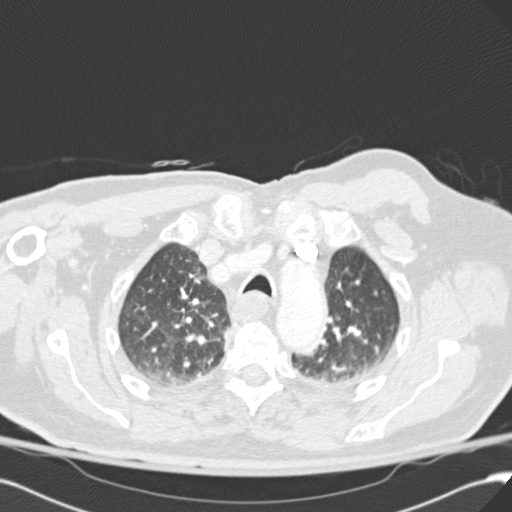
[im 248/301  soft-tissue]
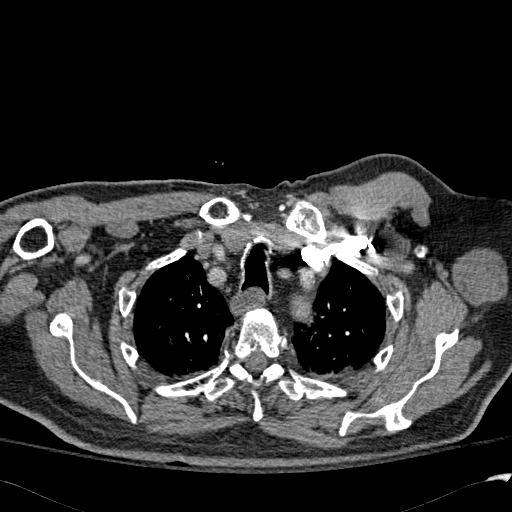
[im 261/301  lung]
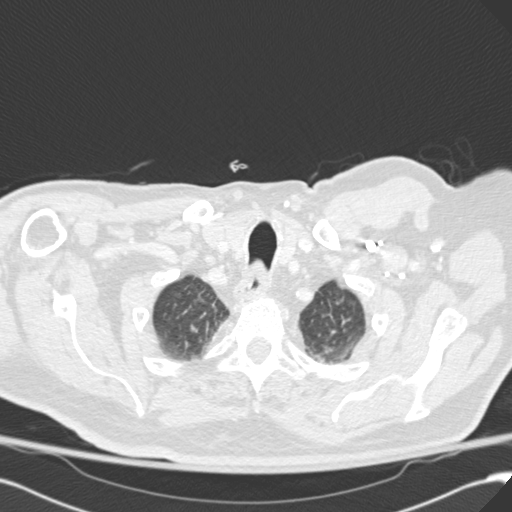
[im 287/301  soft-tissue]
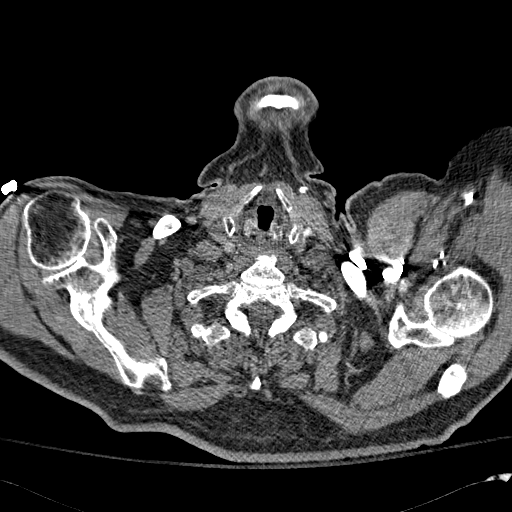

[Series 602: coronal · coronal · 0.71mm/px · 3 of 115 slices shown]
[im 29/115  soft-tissue]
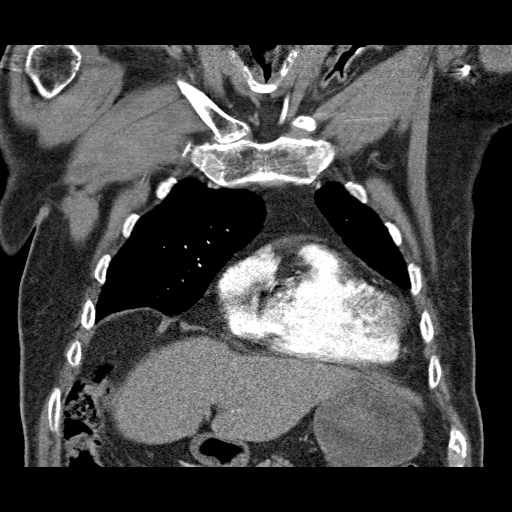
[im 58/115  soft-tissue]
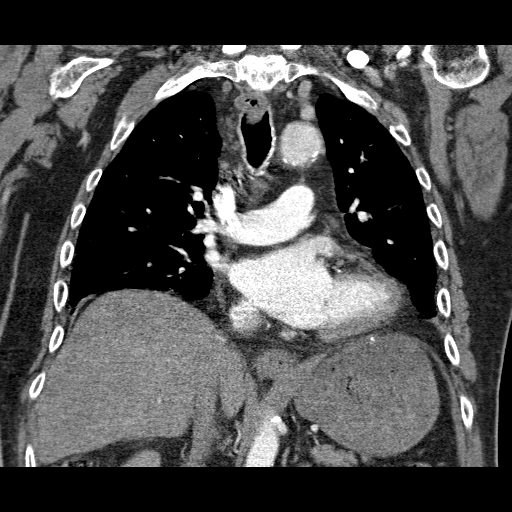
[im 86/115  soft-tissue]
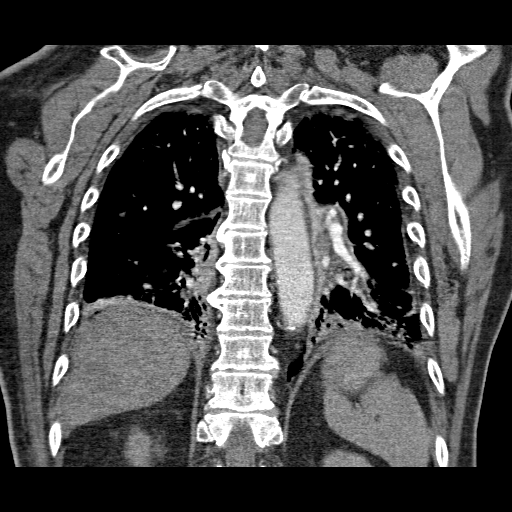

[19 of 46 positions shown; findings below may reference images not displayed]

FINDINGS: Adequate contrast bolus timing in the pulmonary arterial
tree.  No focal filling defect identified in the pulmonary arterial
tree to suggest the presence of acute pulmonary embolism.

Atelectatic changes to the major airways.  Bilateral lower lobe
medial basal segment consolidation with air bronchograms, greater
on the left.  Trace bilateral pleural effusions.  Superimposed
dependent atelectasis in the upper lobes.

Cardiomegaly.  No pericardial effusion.  No mediastinal
lymphadenopathy.  Negative thoracic inlet.  Negative visualized
aorta and great vessels except for atherosclerosis.  There is
coronary artery atherosclerosis.  Negative visualized upper
abdominal viscera.

No acute osseous abnormality identified.
IMPRESSION: 1. No evidence of acute pulmonary embolus.
2.  Bilateral lower lobe pneumonia with trace pleural fluid.]]

## 2012-08-26 IMAGING — CR DG PORTABLE PELVIS
1 series · 1 of 1 positions shown · non-contrast
Comparison: None.

CLINICAL DATA: Status post hip replacement.

PORTABLE PELVIS

[AP]
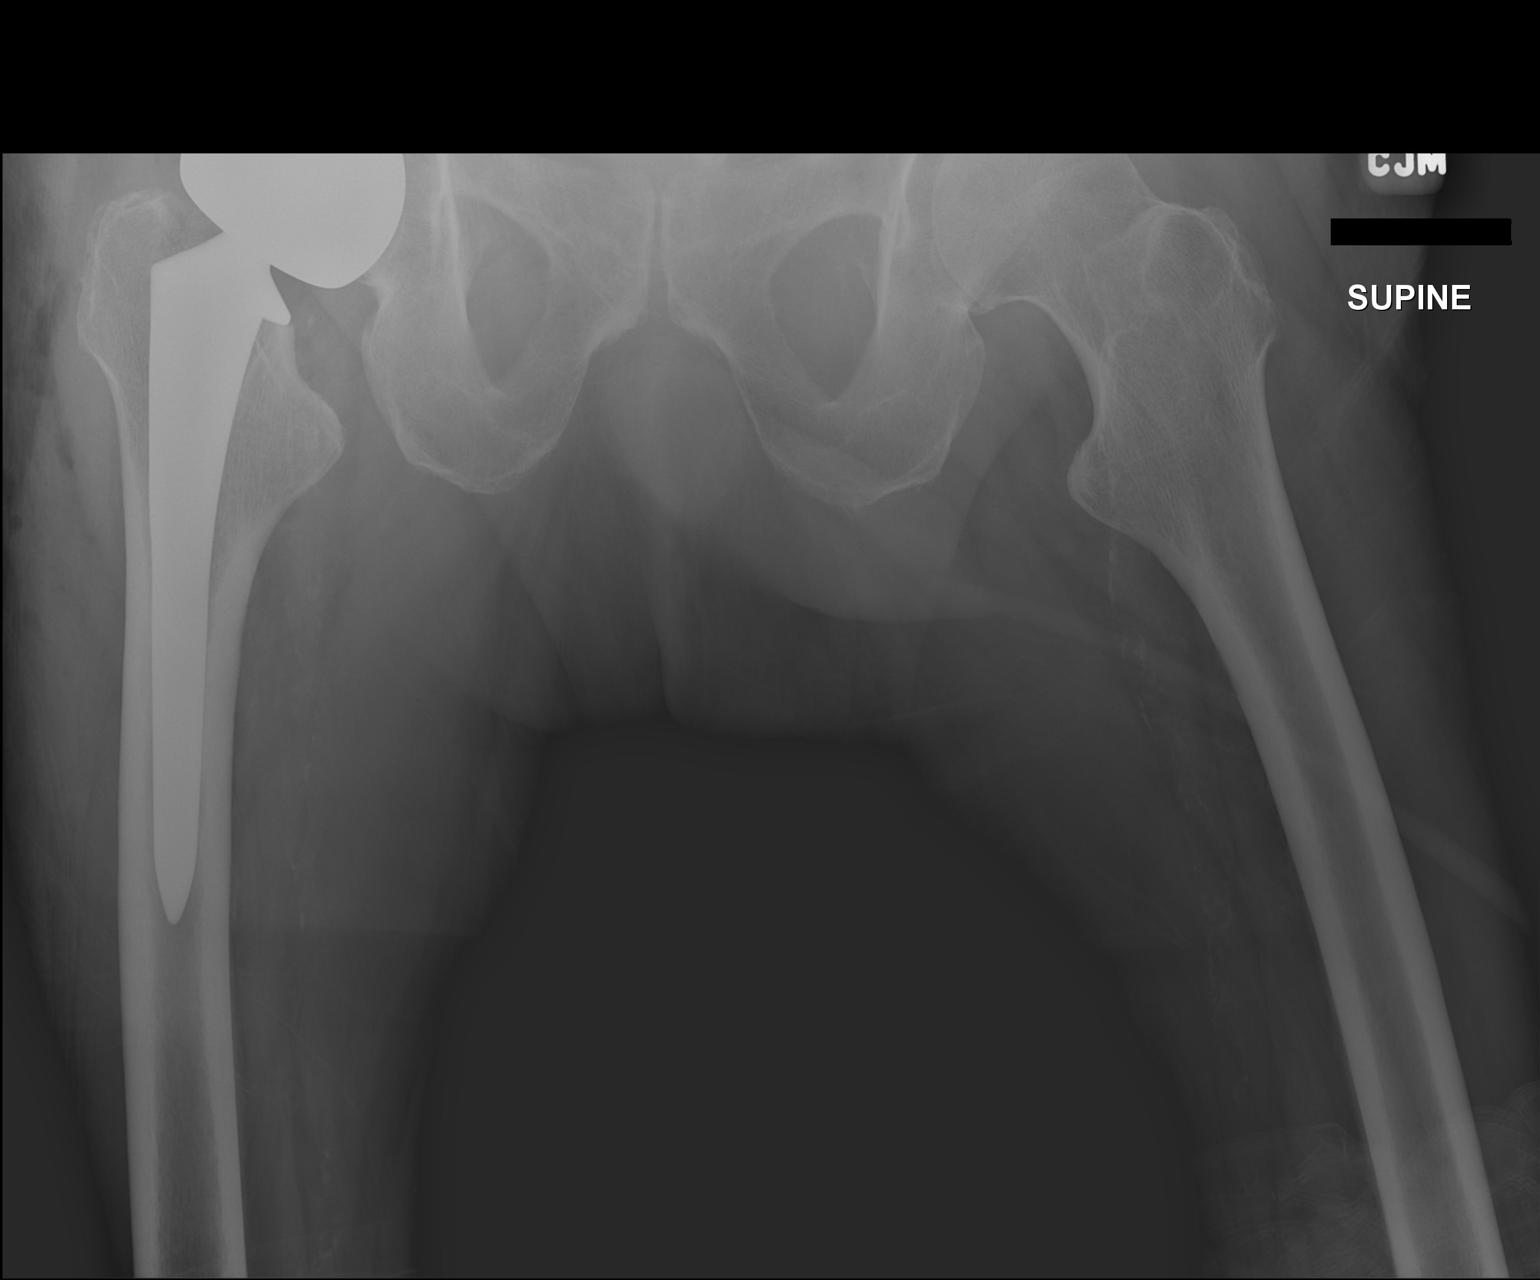

[1 of 1 positions shown; findings below may reference images not displayed]

FINDINGS: Right hip prosthesis in satisfactory position and
alignment.  No fracture or dislocation seen.  Staple overlying the
left superior acetabulum.  Atheromatous arterial calcifications.
IMPRESSION: Satisfactory postoperative appearance of a right hip prosthesis.

## 2012-08-26 IMAGING — CR DG HIP 1V PORT*R*
1 series · 1 of 1 positions shown · non-contrast
Comparison: Portable pelvis obtained at the same time.

CLINICAL DATA: Status post right hip replacement.

PORTABLE RIGHT HIP - 1 VIEW

[AP]
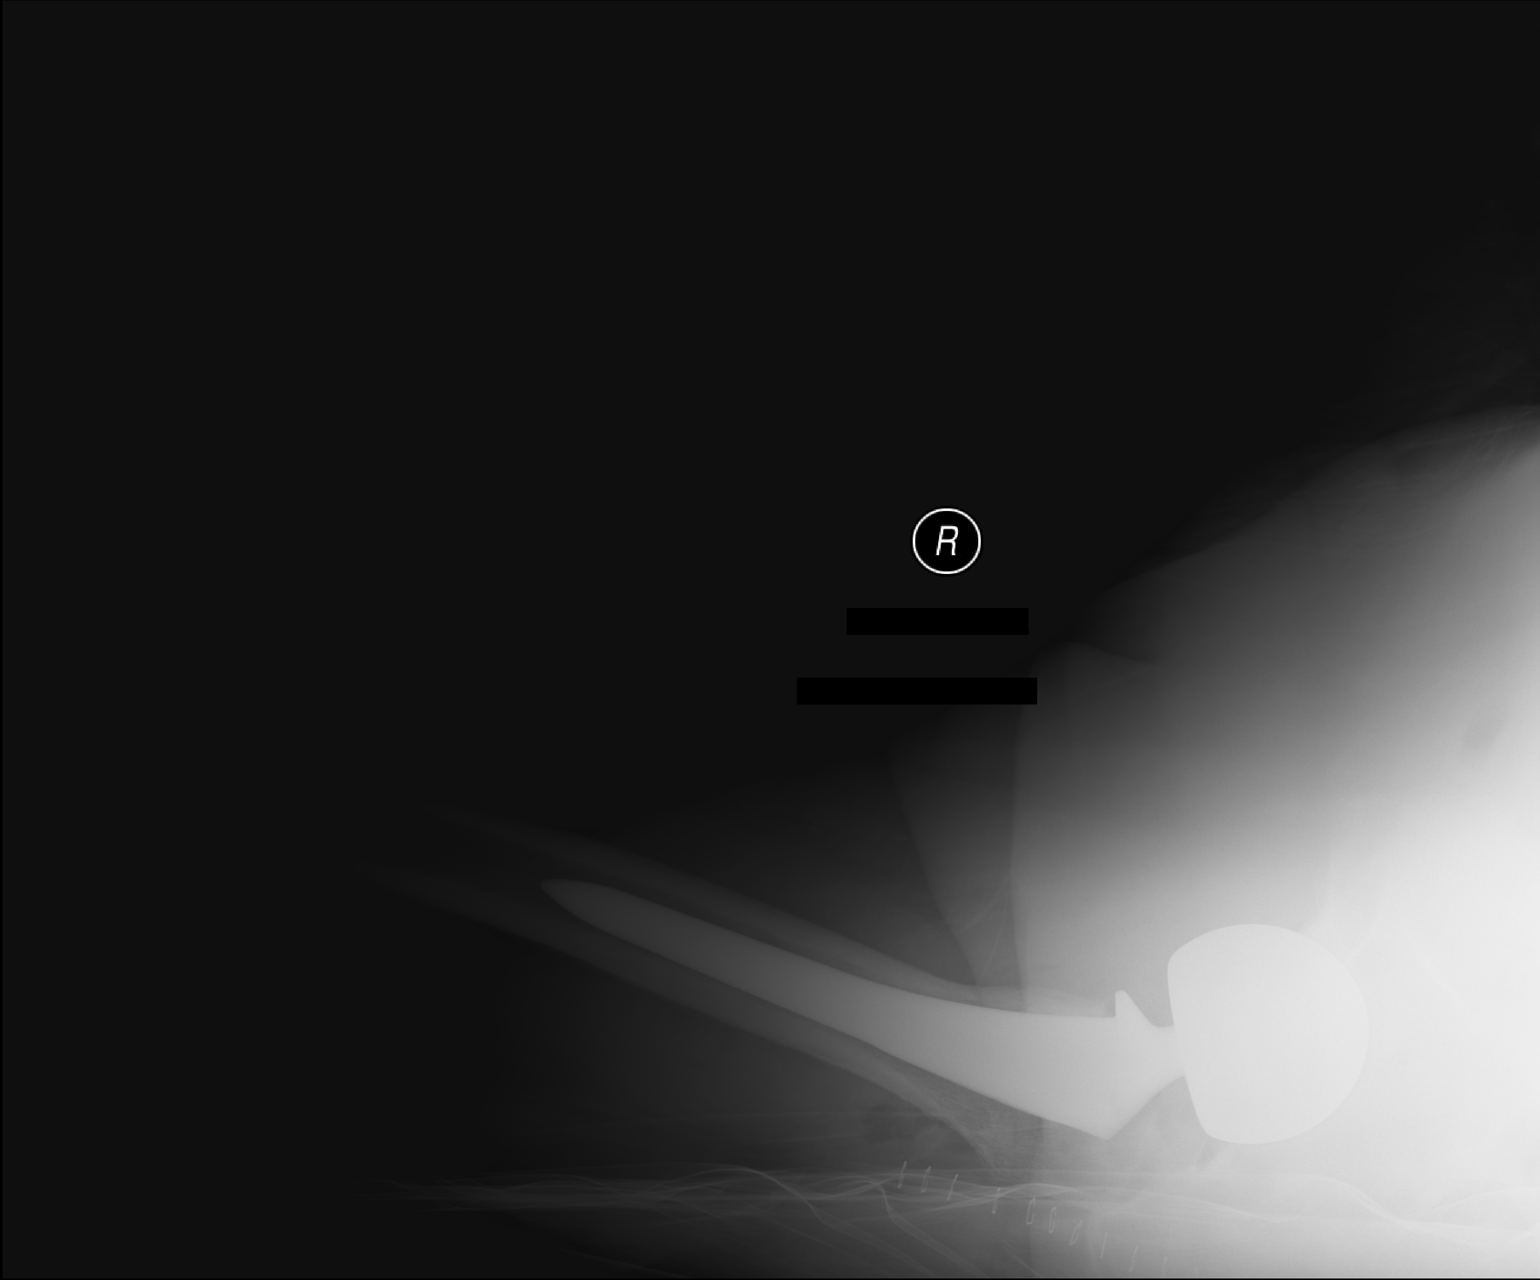

[1 of 1 positions shown; findings below may reference images not displayed]

FINDINGS: Again demonstrated is a right hip prosthesis in
satisfactory position and alignment.  No fracture or dislocation
seen.
IMPRESSION: Satisfactory postoperative appearance of a right hip prosthesis.

## 2012-08-30 ENCOUNTER — Other Ambulatory Visit: Payer: Self-pay | Admitting: Family Medicine

## 2012-09-30 ENCOUNTER — Encounter: Payer: Self-pay | Admitting: Neurology

## 2012-09-30 ENCOUNTER — Ambulatory Visit (INDEPENDENT_AMBULATORY_CARE_PROVIDER_SITE_OTHER): Payer: Medicare Other | Admitting: Neurology

## 2012-09-30 VITALS — BP 102/63 | HR 57 | Ht 72.0 in | Wt 173.5 lb

## 2012-09-30 DIAGNOSIS — G8110 Spastic hemiplegia affecting unspecified side: Secondary | ICD-10-CM

## 2012-09-30 DIAGNOSIS — I69959 Hemiplegia and hemiparesis following unspecified cerebrovascular disease affecting unspecified side: Secondary | ICD-10-CM

## 2012-09-30 MED ORDER — ONABOTULINUMTOXINA 100 UNITS IJ SOLR: 300.0000 [IU] | Freq: Once | INTRAMUSCULAR | Status: DC

## 2012-09-30 NOTE — Progress Notes (Signed)
HPI: [name] is a 14 year patient referred by Dr. Pearlean Brownie for EMG guided Botulinumtoxin injection for spastic right hemiparesis.  He sufferred acute stroke in left subinsular cortex extending into corona radiata and basal ganglia in June 2011, CT angiogram showed left ICA occlusion in neck with recanalization of left terminal ICA and MCA via collaterals.  After rehab, he is now wheelchair dependent, with spastic right hemiparesis, able to walk with walker and asssistant. He continues to have significant lack of functional use of right hand with nonfixed flexion contractures. He denies signficiant pain.  He has tried Xeomin injection, only with mild improvement, seems to respond better with BOTOX A.  His wife is doing passive stretching of right shoulder and elbow, he deneis signficiant pain, he wear wrist splint at night, he also use walking device to help his right ankle dorsiflexion.  UPDATE May 12th 2014: Last EMG guided BOTOX A  injection to his right upper and  lower extremity was in Feb 2014.   He responded very well, no signficant side effect. He denies significant pain of his right arm  Physical Exam  Ears, Nose and Throat: Hearing is normal.  Neck: supple without bruit Respiratory: clear to auscultation Cardiovascular: no murmur or gallop Musculoskeletal: mild kyphosis Skin: few petechiae  Neurologic Exam  Mental Status: sit in wheelchair, pleasant cooperative. Cranial Nerves: CN II-XII mild right lower face weakness. Motor: Spastic right hemiparesis. Right shoulder abduction elbow flexion, extension 3/5 , right arm in pronation, elbow flexion, wrist in neutral position, thumb in finger flexion,  with passive movement, he has near normal range of motion of right elbow with passive movement.  Right ankle plantar inversion, moderate right leg spasticity, right hammor toes,  Sensory: Touch and pinprick sensations are normal.   Coordination: normal Gait and Station: Right ankle  plantar flexion Reflexes: Deep tendon reflexes are 2+ asymmetric and brisker on right.     Assessment and Plan: Under EMG guidance, 300 units of BOTOX A was used for right upper extremity spasticity, (Lot No.3501 C2, exp Oct 2016), 100 units were dissovled into 2 cc NS.  200 units in right UE, and 100 units in right LE  Right pronator teres 25 Right flexor carpi ulnaris 50 Right flexor digitorum superficialis 50 Right flexor digitorum profundus 50 Right palmaris longus 25    Right extensor hallucis longus 25 units  Right tibialis posterior 50 units Right medial gastrocnemius 25 units  He tolerated the injection well, will come in three months for repeat injections.

## 2012-10-02 ENCOUNTER — Other Ambulatory Visit: Payer: Self-pay | Admitting: Family Medicine

## 2012-10-10 ENCOUNTER — Telehealth: Payer: Self-pay | Admitting: Internal Medicine

## 2012-10-10 MED ORDER — SIMVASTATIN 20 MG PO TABS: 20.0000 mg | ORAL_TABLET | Freq: Every evening | ORAL | Status: DC

## 2012-10-10 NOTE — Telephone Encounter (Signed)
Refill request for simvastatin 20mg  to [facility] [address]

## 2012-10-10 NOTE — Telephone Encounter (Signed)
SENT MED IN 

## 2012-10-18 ENCOUNTER — Telehealth: Payer: Self-pay | Admitting: Internal Medicine

## 2012-10-18 MED ORDER — CLOPIDOGREL BISULFATE 75 MG PO TABS: 75.0000 mg | ORAL_TABLET | Freq: Every morning | ORAL | Status: DC

## 2012-10-18 NOTE — Telephone Encounter (Signed)
Refill request for plavix 75mg  to [facility] [address]

## 2012-10-18 NOTE — Telephone Encounter (Signed)
SENT PLAVIX

## 2012-12-05 ENCOUNTER — Telehealth: Payer: Self-pay | Admitting: Internal Medicine

## 2012-12-05 MED ORDER — AMLODIPINE BESYLATE 10 MG PO TABS: ORAL_TABLET | ORAL | Status: DC

## 2012-12-05 NOTE — Telephone Encounter (Signed)
SENT MED IN 

## 2012-12-05 NOTE — Telephone Encounter (Signed)
Refill request for amlodipine 10mg  #30 to [facility] [address]

## 2013-01-09 ENCOUNTER — Ambulatory Visit (INDEPENDENT_AMBULATORY_CARE_PROVIDER_SITE_OTHER): Payer: Medicare Other | Admitting: Neurology

## 2013-01-09 ENCOUNTER — Encounter: Payer: Self-pay | Admitting: Neurology

## 2013-01-09 VITALS — BP 111/63 | HR 56 | Ht 72.0 in | Wt 174.0 lb

## 2013-01-09 DIAGNOSIS — E785 Hyperlipidemia, unspecified: Secondary | ICD-10-CM

## 2013-01-09 DIAGNOSIS — I6529 Occlusion and stenosis of unspecified carotid artery: Secondary | ICD-10-CM

## 2013-01-09 DIAGNOSIS — G8110 Spastic hemiplegia affecting unspecified side: Secondary | ICD-10-CM

## 2013-01-09 DIAGNOSIS — I10 Essential (primary) hypertension: Secondary | ICD-10-CM

## 2013-01-09 MED ORDER — ONABOTULINUMTOXINA 100 UNITS IJ SOLR
250.0000 [IU] | Freq: Once | INTRAMUSCULAR | Status: AC
  Administered 2013-01-09: 250 [IU] via INTRAMUSCULAR

## 2013-01-09 NOTE — Progress Notes (Signed)
HPI: [name] is a 70 year patient referred by Dr. Pearlean Kent for EMG guided Botulinumtoxin injection for spastic right hemiparesis.  He sufferred acute stroke in left subinsular cortex extending into corona radiata and basal ganglia in June 2011, CT angiogram showed left ICA occlusion in neck with recanalization of left terminal ICA and MCA via collaterals.  After rehab, he is now wheelchair dependent, with spastic right hemiparesis, able to walk with walker and asssistant. He continues to have significant lack of functional use of right hand with nonfixed flexion contractures. He denies signficiant pain.  He has tried Xeomin injection, only with mild improvement, seems to respond better with BOTOX A.  His wife is doing passive stretching of right shoulder and elbow, he deneis signficiant pain, he wear wrist splint at night, he also use walking device to help his right ankle dorsiflexion.  UPDATE January 09 2013: Last EMG guided BOTOX A  injection to his right upper and  lower extremity was in May 2014.   He responded very well, no signficant side effect. He denies significant pain of his right arm, still ambulatory, but with increased difficulty because of right lower extremity spasticity, also suffered right hip fracture require surgical treatment in August 2013.   Physical Exam  Ears, Nose and Throat: Hearing is normal.  Neck: supple without bruit Respiratory: clear to auscultation Cardiovascular: no murmur or gallop Musculoskeletal: mild kyphosis Skin: few petechiae  Neurologic Exam  Mental Status: sit in wheelchair, pleasant cooperative. Cranial Nerves: CN II-XII mild right lower face weakness. Motor: Spastic right hemiparesis. Right shoulder abduction elbow flexion, extension 3/5 , right arm in pronation, elbow flexion, wrist in neutral position, thumb in finger flexion,  with passive movement, he has near normal range of motion of right elbow with passive movement.  Right ankle plantar  inversion, moderate right leg spasticity, right hammor toes,  Sensory: Touch and pinprick sensations are normal.   Coordination: normal Gait and Station: Right ankle plantar flexion Reflexes: Deep tendon reflexes are 2+ asymmetric and brisker on right.     Assessment and Plan: Under EMG guidance, 300 units of BOTOX A was used for right upper extremity spasticity, (Lot No.[id number]), 100 units were dissovled into 2 cc NS.  200 units in right UE, and 50 units in right LE, for total of 250 units  Right pronator teres 25 Right flexor carpi ulnaris 50 Right flexor digitorum superficialis 50 Right flexor digitorum profundus 50 Right palmaris longus 25    Right tibialis posterior 25 units Right medial gastrocnemius 25 units  He tolerated the injection well, will come in three months for repeat injections, will increase his injections dosage to 400 units total, refer him to home health PT/OT

## 2013-01-11 DIAGNOSIS — Z5189 Encounter for other specified aftercare: Secondary | ICD-10-CM | POA: Diagnosis not present

## 2013-01-11 DIAGNOSIS — I69959 Hemiplegia and hemiparesis following unspecified cerebrovascular disease affecting unspecified side: Secondary | ICD-10-CM | POA: Diagnosis not present

## 2013-01-13 ENCOUNTER — Telehealth: Payer: Self-pay | Admitting: Neurology

## 2013-01-13 DIAGNOSIS — I69959 Hemiplegia and hemiparesis following unspecified cerebrovascular disease affecting unspecified side: Secondary | ICD-10-CM | POA: Diagnosis not present

## 2013-01-13 DIAGNOSIS — Z5189 Encounter for other specified aftercare: Secondary | ICD-10-CM | POA: Diagnosis not present

## 2013-01-13 NOTE — Telephone Encounter (Signed)
Called Debroah Loop back and told him I would let Dr.Yan know about message.

## 2013-01-14 DIAGNOSIS — Z5189 Encounter for other specified aftercare: Secondary | ICD-10-CM | POA: Diagnosis not present

## 2013-01-14 DIAGNOSIS — I69959 Hemiplegia and hemiparesis following unspecified cerebrovascular disease affecting unspecified side: Secondary | ICD-10-CM | POA: Diagnosis not present

## 2013-01-15 DIAGNOSIS — S72033A Displaced midcervical fracture of unspecified femur, initial encounter for closed fracture: Secondary | ICD-10-CM | POA: Diagnosis not present

## 2013-01-15 DIAGNOSIS — IMO0001 Reserved for inherently not codable concepts without codable children: Secondary | ICD-10-CM | POA: Diagnosis not present

## 2013-01-16 DIAGNOSIS — Z5189 Encounter for other specified aftercare: Secondary | ICD-10-CM | POA: Diagnosis not present

## 2013-01-16 DIAGNOSIS — I69959 Hemiplegia and hemiparesis following unspecified cerebrovascular disease affecting unspecified side: Secondary | ICD-10-CM | POA: Diagnosis not present

## 2013-01-21 DIAGNOSIS — Z5189 Encounter for other specified aftercare: Secondary | ICD-10-CM | POA: Diagnosis not present

## 2013-01-21 DIAGNOSIS — I69959 Hemiplegia and hemiparesis following unspecified cerebrovascular disease affecting unspecified side: Secondary | ICD-10-CM | POA: Diagnosis not present

## 2013-01-23 DIAGNOSIS — I69959 Hemiplegia and hemiparesis following unspecified cerebrovascular disease affecting unspecified side: Secondary | ICD-10-CM | POA: Diagnosis not present

## 2013-01-23 DIAGNOSIS — Z5189 Encounter for other specified aftercare: Secondary | ICD-10-CM | POA: Diagnosis not present

## 2013-01-28 DIAGNOSIS — Z5189 Encounter for other specified aftercare: Secondary | ICD-10-CM | POA: Diagnosis not present

## 2013-01-28 DIAGNOSIS — I69959 Hemiplegia and hemiparesis following unspecified cerebrovascular disease affecting unspecified side: Secondary | ICD-10-CM | POA: Diagnosis not present

## 2013-01-30 DIAGNOSIS — Z5189 Encounter for other specified aftercare: Secondary | ICD-10-CM | POA: Diagnosis not present

## 2013-01-30 DIAGNOSIS — I69959 Hemiplegia and hemiparesis following unspecified cerebrovascular disease affecting unspecified side: Secondary | ICD-10-CM | POA: Diagnosis not present

## 2013-02-06 ENCOUNTER — Telehealth: Payer: Self-pay | Admitting: Family Medicine

## 2013-02-06 MED ORDER — AMLODIPINE BESYLATE 10 MG PO TABS: ORAL_TABLET | ORAL | Status: DC

## 2013-02-06 NOTE — Telephone Encounter (Signed)
SENT MED IN 

## 2013-02-06 NOTE — Telephone Encounter (Signed)
Pt is requesting refill on Amlodipine Besylate 10mg  # 30 from [facility] on [address].  Pt has not been in for med check in a while.

## 2013-02-28 ENCOUNTER — Telehealth: Payer: Self-pay | Admitting: Family Medicine

## 2013-02-28 MED ORDER — LISINOPRIL 10 MG PO TABS: 10.0000 mg | ORAL_TABLET | Freq: Every day | ORAL | Status: DC

## 2013-02-28 NOTE — Telephone Encounter (Signed)
Needs refill on lisinopril  Sent to [facility].  [facility] told pt they have called [place] twice and we have not responded. I do not see a record of a request.

## 2013-03-18 ENCOUNTER — Ambulatory Visit (INDEPENDENT_AMBULATORY_CARE_PROVIDER_SITE_OTHER): Payer: Medicare Other | Admitting: Family Medicine

## 2013-03-18 ENCOUNTER — Encounter: Payer: Self-pay | Admitting: Family Medicine

## 2013-03-18 VITALS — BP 112/70 | HR 57 | Wt 179.0 lb

## 2013-03-18 DIAGNOSIS — Z23 Encounter for immunization: Secondary | ICD-10-CM

## 2013-03-18 DIAGNOSIS — E785 Hyperlipidemia, unspecified: Secondary | ICD-10-CM | POA: Diagnosis not present

## 2013-03-18 DIAGNOSIS — I658 Occlusion and stenosis of other precerebral arteries: Secondary | ICD-10-CM

## 2013-03-18 DIAGNOSIS — Z79899 Other long term (current) drug therapy: Secondary | ICD-10-CM

## 2013-03-18 DIAGNOSIS — I6529 Occlusion and stenosis of unspecified carotid artery: Secondary | ICD-10-CM

## 2013-03-18 DIAGNOSIS — I69959 Hemiplegia and hemiparesis following unspecified cerebrovascular disease affecting unspecified side: Secondary | ICD-10-CM

## 2013-03-18 DIAGNOSIS — I6523 Occlusion and stenosis of bilateral carotid arteries: Secondary | ICD-10-CM

## 2013-03-18 DIAGNOSIS — G8110 Spastic hemiplegia affecting unspecified side: Secondary | ICD-10-CM

## 2013-03-18 DIAGNOSIS — I10 Essential (primary) hypertension: Secondary | ICD-10-CM | POA: Diagnosis not present

## 2013-03-18 LAB — COMPREHENSIVE METABOLIC PANEL
AST: 20 U/L (ref 0–37)
Albumin: 4 g/dL (ref 3.5–5.2)
BUN: 17 mg/dL (ref 6–23)
Calcium: 8.8 mg/dL (ref 8.4–10.5)
Chloride: 104 mEq/L (ref 96–112)
Creat: 0.83 mg/dL (ref 0.50–1.35)
Glucose, Bld: 93 mg/dL (ref 70–99)
Potassium: 4.4 mEq/L (ref 3.5–5.3)

## 2013-03-18 LAB — CBC WITH DIFFERENTIAL/PLATELET
Basophils Absolute: 0 10*3/uL (ref 0.0–0.1)
Eosinophils Relative: 2 % (ref 0–5)
HCT: 41.6 % (ref 39.0–52.0)
Hemoglobin: 14.7 g/dL (ref 13.0–17.0)
Lymphocytes Relative: 20 % (ref 12–46)
MCHC: 35.3 g/dL (ref 30.0–36.0)
MCV: 95.6 fL (ref 78.0–100.0)
Monocytes Absolute: 0.8 10*3/uL (ref 0.1–1.0)
Monocytes Relative: 11 % (ref 3–12)
Neutro Abs: 5.3 10*3/uL (ref 1.7–7.7)
RDW: 14.3 % (ref 11.5–15.5)
WBC: 7.9 10*3/uL (ref 4.0–10.5)

## 2013-03-18 LAB — LIPID PANEL
Cholesterol: 118 mg/dL (ref 0–200)
HDL: 42 mg/dL (ref >39)
Total CHOL/HDL Ratio: 2.8 Ratio
Triglycerides: 128 mg/dL (ref <150)

## 2013-03-18 NOTE — Progress Notes (Signed)
  Subjective:    Patient ID: Richard Kent, male    DOB: Jan 19, 1929, 79 y.o.   MRN: [id number]  HPI  He is here for medication check. He has a history of CVA and still has some spastic hemiplegia. He is on medications for this and he is to be doing well. His wife does help him although he is mobile except for not being of use his right arm. He continues on medications listed in the chart and is having no difficulty with them. He has a history of carotid stenosis however this is being handled medically due to his overall medical condition. He has no particular concerns or complaints.   Review of Systems     Objective:   Physical Exam alert and in no distress. Tympanic membranes and canals are normal. Throat is clear. Tonsils are normal. Neck is supple without adenopathy or thyromegaly. Cardiac exam shows a regular sinus rhythm without murmurs or gallops. Lungs are clear to auscultation. Limited motion of the right arm is noted.       Assessment & Plan:  Need for prophylactic vaccination and inoculation against influenza - Plan: Flu vaccine HIGH DOSE PF (Fluzone Tri High dose)  Spastic hemiplegia affecting dominant side  Hypertension - Plan: CBC with Differential, Comprehensive metabolic panel  DYSLIPIDEMIA - Plan: Lipid panel  Encounter for long-term (current) use of other medications - Plan: CBC with Differential, Comprehensive metabolic panel, Lipid panel  Carotid stenosis, bilateral  CVA WITH LEFT HEMIPARESIS  flu shot given with risks and benefits discussed. He will continue on his present medication regimen.

## 2013-03-19 NOTE — Progress Notes (Signed)
Quick Note:  CALLED [name] PT VERBALIZED UNDERSTANDING ______

## 2013-03-28 ENCOUNTER — Telehealth: Payer: Self-pay | Admitting: Internal Medicine

## 2013-03-28 NOTE — Telephone Encounter (Signed)
Refill request for lisinopril 10mg  #30 to [facility]

## 2013-03-31 ENCOUNTER — Other Ambulatory Visit: Payer: Self-pay

## 2013-03-31 MED ORDER — LISINOPRIL 10 MG PO TABS: 10.0000 mg | ORAL_TABLET | Freq: Every day | ORAL | Status: DC

## 2013-03-31 NOTE — Telephone Encounter (Signed)
SENT B/P MED IN 

## 2013-04-02 NOTE — Telephone Encounter (Signed)
done

## 2013-04-08 ENCOUNTER — Telehealth: Payer: Self-pay | Admitting: Internal Medicine

## 2013-04-08 ENCOUNTER — Other Ambulatory Visit: Payer: Self-pay

## 2013-04-08 MED ORDER — SIMVASTATIN 20 MG PO TABS: 20.0000 mg | ORAL_TABLET | Freq: Every evening | ORAL | Status: DC

## 2013-04-08 NOTE — Telephone Encounter (Signed)
DONE

## 2013-04-08 NOTE — Telephone Encounter (Signed)
SENT IN [place]

## 2013-04-08 NOTE — Telephone Encounter (Signed)
Refill request for zocor 20mg  #90 to [facility] [address]

## 2013-04-16 ENCOUNTER — Ambulatory Visit: Payer: Managed Care, Other (non HMO) | Admitting: Neurology

## 2013-04-21 ENCOUNTER — Other Ambulatory Visit: Payer: Self-pay

## 2013-04-21 MED ORDER — CLOPIDOGREL BISULFATE 75 MG PO TABS: 75.0000 mg | ORAL_TABLET | Freq: Every morning | ORAL | Status: DC

## 2013-04-21 NOTE — Telephone Encounter (Signed)
Sent plavix in per fax

## 2013-04-23 ENCOUNTER — Encounter: Payer: Self-pay | Admitting: Neurology

## 2013-04-23 ENCOUNTER — Ambulatory Visit (INDEPENDENT_AMBULATORY_CARE_PROVIDER_SITE_OTHER): Payer: Medicare Other | Admitting: Neurology

## 2013-04-23 VITALS — BP 108/66 | HR 65

## 2013-04-23 DIAGNOSIS — G8110 Spastic hemiplegia affecting unspecified side: Secondary | ICD-10-CM

## 2013-04-23 MED ORDER — ONABOTULINUMTOXINA 100 UNITS IJ SOLR
400.0000 [IU] | Freq: Once | INTRAMUSCULAR | Status: AC
  Administered 2013-04-23: 400 [IU] via INTRAMUSCULAR

## 2013-04-23 NOTE — Progress Notes (Signed)
HPI: [name] is a 70 year patient referred by Dr. Pearlean Brownie for EMG guided Botulinumtoxin injection for spastic right hemiparesis.  He sufferred acute stroke in left subinsular cortex extending into corona radiata and basal ganglia in June 2011, CT angiogram showed left ICA occlusion in neck with recanalization of left terminal ICA and MCA via collaterals.  After rehab, he is now wheelchair dependent, with spastic right hemiparesis, able to walk with walker and asssistant. He continues to have significant lack of functional use of right hand with nonfixed flexion contractures. He denies signficiant pain.  He has tried Xeomin injection, only with mild improvement, seems to respond better with BOTOX A.  His wife is doing passive stretching of right shoulder and elbow, he deneis signficiant pain, he wear wrist splint at night, he also use walking device to help his right ankle dorsiflexion.  He began to receive EMG guided BOTOX injection to his right upper and lower extremity since Feb 2012.  He still ambulate, but with increased difficulty, because of right lower extremity spasticity, also suffered right hip fracture require surgical treatment in August 2013.  UPDATE 04/23/2013: Last EMG guided BOTOX A  injection to his right upper and  lower extremity was in August, 2014.   He responded very well, no signficant side effect, he reported 50% improvement at his right leg, he can pick up his right foot better. He denies significant pain of his right arm, still ambulatory,   Physical Exam  Ears, Nose and Throat: Hearing is normal.  Neck: supple without bruit Respiratory: clear to auscultation Cardiovascular: no murmur or gallop Musculoskeletal: mild kyphosis Skin: few petechiae  Neurologic Exam  Mental Status: sit in wheelchair, pleasant cooperative. Cranial Nerves: CN II-XII mild right lower face weakness. Motor: Spastic right hemiparesis. Right shoulder abduction elbow flexion, extension 3/5 ,  right arm in pronation, elbow flexion, wrist in neutral position, thumb in, right finger flexion,  with passive movement, he has near normal range of motion of right elbow with passive movement.  Right ankle plantar inversion, moderate right leg spasticity, right hammor toes,  Sensory: Touch and pinprick sensations are normal.   Coordination: normal Gait and Station: Right ankle plantar flexion, spastic right hemi-circumeferencial gait, He has difficulty bending his right knee. Reflexes: Deep tendon reflexes are 2+ asymmetric and brisker on right.     Assessment and Plan: Under EMG guidance, 400 units of BOTOX A was used for right upper extremity spasticity, (Lot No.[id number], exp March 2017), 100 units were dissovled into 2 cc NS.  300 units in right UE, and 100  units in right LE, for total of 400 units  Right pronator teres 25 Right flexor carpi ulnaris 50 Right flexor digitorum superficialis 50 Right flexor digitorum profundus 50 Right palmaris longus 25 Right pectoralis major 50 Right brachialis 50    Right tibialis posterior 50 units Right flexor digitorum longus 50 units  He tolerated the injection well, will come in three months for repeat injections, for total of 400 units.

## 2013-05-22 ENCOUNTER — Other Ambulatory Visit: Payer: Self-pay | Admitting: Family Medicine

## 2013-05-23 NOTE — Telephone Encounter (Signed)
Is this okay to refill? 

## 2013-06-27 ENCOUNTER — Other Ambulatory Visit: Payer: Self-pay | Admitting: Family Medicine

## 2013-06-27 NOTE — Telephone Encounter (Signed)
Is this okay to refill? 

## 2013-07-23 ENCOUNTER — Encounter: Payer: Self-pay | Admitting: Neurology

## 2013-07-23 ENCOUNTER — Encounter (INDEPENDENT_AMBULATORY_CARE_PROVIDER_SITE_OTHER): Payer: Self-pay

## 2013-07-23 ENCOUNTER — Ambulatory Visit (INDEPENDENT_AMBULATORY_CARE_PROVIDER_SITE_OTHER): Payer: Medicare Other | Admitting: Neurology

## 2013-07-23 VITALS — BP 126/66 | HR 59 | Wt 178.0 lb

## 2013-07-23 DIAGNOSIS — G8110 Spastic hemiplegia affecting unspecified side: Secondary | ICD-10-CM | POA: Diagnosis not present

## 2013-07-25 MED ORDER — ONABOTULINUMTOXINA 100 UNITS IJ SOLR
500.0000 [IU] | Freq: Once | INTRAMUSCULAR | Status: AC
  Administered 2013-07-25: 500 [IU] via INTRAMUSCULAR

## 2013-07-25 NOTE — Progress Notes (Signed)
HPI: [name] is a 66 year patient referred by Dr. Leonie Man for EMG guided Botulinumtoxin injection for spastic right hemiparesis.  He sufferred acute stroke in left subinsular cortex extending into corona radiata and basal ganglia in June 2011, CT angiogram showed left ICA occlusion in neck with recanalization of left terminal ICA and MCA via collaterals.  After rehab, he is now wheelchair dependent, with spastic right hemiparesis, able to walk with walker and asssistant. He continues to have significant lack of functional use of right hand with nonfixed flexion contractures. He denies signficiant pain.  He has tried Xeomin injection, only with mild improvement, seems to respond better with BOTOX A.  His wife is doing passive stretching of right shoulder and elbow, he deneis signficiant pain, he wear wrist splint at night, he also use walking device to help his right ankle dorsiflexion.  He began to receive EMG guided BOTOX injection to his right upper and lower extremity since Feb 2012.  He still ambulate, but with increased difficulty, because of right lower extremity spasticity, also suffered right hip fracture require surgical treatment in August 2013.  UPDATE March 4th 2015: Last EMG guided BOTOX A  injection to his right upper and  lower extremity was in Dec 2014.   He responded very well, no signficant side effect, he reported moderate improvement at his right leg, he can pick up his right foot better. He denies significant pain of his right arm, still ambulatory,   Physical Exam  Ears, Nose and Throat: Hearing is normal.  Neck: supple without bruit Respiratory: clear to auscultation Cardiovascular: no murmur or gallop Musculoskeletal: mild kyphosis Skin: few petechiae  Neurologic Exam  Mental Status: sit in wheelchair, pleasant cooperative. Cranial Nerves: CN II-XII mild right lower face weakness. Motor: Spastic right hemiparesis. Right shoulder internal rotation, adduction, right  elbow flexion, extension 3/5 , wrist in neutral position, bilateral finger flexion,  He has near normal range of motion of right elbow with passive movement.  He has moderate to severe right lower extremity spasticity, resistance through out passive movement,  Right hip flexion 2, knee flexion, 2, extension 3, right ankle plantar inversion, moderate right leg spasticity, right hammor toes,  Sensory: Touch and pinprick sensations are normal.   Coordination: normal Gait and Station: Right ankle plantar flexion, spastic right hemi-circumeferencial gait, with right knee locked in extension. Reflexes: Deep tendon reflexes are 2+ asymmetric and brisker on right.     Assessment and Plan: Under EMG guidance, 500 units of BOTOX A was used for right upper extremity spasticity, (Lot No.3609, exp March 2017), 100/2cc normal saline.   300 units in right UE, and 200  units in right LE, for total of 500 units  Right pronator teres 25 Right flexor carpi ulnaris 25 Right flexor digitorum superficialis 50 Right flexor digitorum profundus 50 Right palmaris longus 25 Right pectoralis major 25 Right brachialis 25 Right teres major 25 units Right lumbricals, 3 total of 25 units. Right biceps 25 units  Right adductor longus 25x2=50 units Right adductor magnus 25x2=50 units Right tibialis posterior 25x2=50 units Right flexor digitorum longus 25x2=50 units    He tolerated the injection well, will come in three months for repeat injections,  He is enrolled in ASPIRE trial since Dec 2014.

## 2013-10-06 ENCOUNTER — Other Ambulatory Visit: Payer: Self-pay | Admitting: Family Medicine

## 2013-10-06 NOTE — Telephone Encounter (Signed)
Is this ok to refill?  

## 2013-10-16 ENCOUNTER — Ambulatory Visit: Payer: Medicare Other | Admitting: Family Medicine

## 2013-10-17 ENCOUNTER — Ambulatory Visit (INDEPENDENT_AMBULATORY_CARE_PROVIDER_SITE_OTHER): Payer: Medicare Other | Admitting: Family Medicine

## 2013-10-17 ENCOUNTER — Encounter: Payer: Self-pay | Admitting: Family Medicine

## 2013-10-17 VITALS — BP 120/60 | Wt 181.0 lb

## 2013-10-17 DIAGNOSIS — I69959 Hemiplegia and hemiparesis following unspecified cerebrovascular disease affecting unspecified side: Secondary | ICD-10-CM

## 2013-10-17 DIAGNOSIS — I10 Essential (primary) hypertension: Secondary | ICD-10-CM

## 2013-10-17 DIAGNOSIS — I658 Occlusion and stenosis of other precerebral arteries: Secondary | ICD-10-CM

## 2013-10-17 DIAGNOSIS — E785 Hyperlipidemia, unspecified: Secondary | ICD-10-CM

## 2013-10-17 DIAGNOSIS — I6529 Occlusion and stenosis of unspecified carotid artery: Secondary | ICD-10-CM

## 2013-10-17 DIAGNOSIS — I6523 Occlusion and stenosis of bilateral carotid arteries: Secondary | ICD-10-CM

## 2013-10-17 NOTE — Progress Notes (Signed)
   Subjective:    Patient ID: Richard Kent, male    DOB: 1929-03-15, 80 y.o.   MRN: [id number]  HPI He is here for a followup visit. He continues to do well. He has had no further difficulty with weakness, blurred vision, double vision, syncope, chest pain. He has a TENS unit and does use Zanaflex. Continues on medications listed in the chart. Smoking and drinking were reviewed. His physical activity is limited due to his CVA. He has no particular concerns or complaints.   Review of Systems     Objective:   Physical Exam alert and in no distress. Tympanic membranes and canals are normal. Throat is clear. Tonsils are normal. Neck is supple without adenopathy or thyromegaly. Cardiac exam shows a regular sinus rhythm without murmurs or gallops. Lungs are clear to auscultation.        Assessment & Plan:  CVA WITH LEFT HEMIPARESIS  Hypertension  Carotid stenosis, bilateral  DYSLIPIDEMIA  continue on present medication regimen. Recheck here in 5 or 6 months. Blood work at that time.

## 2013-10-22 ENCOUNTER — Encounter: Payer: Self-pay | Admitting: Neurology

## 2013-10-22 ENCOUNTER — Ambulatory Visit (INDEPENDENT_AMBULATORY_CARE_PROVIDER_SITE_OTHER): Payer: Medicare Other | Admitting: Neurology

## 2013-10-22 VITALS — Ht 71.0 in | Wt 188.0 lb

## 2013-10-22 DIAGNOSIS — G8110 Spastic hemiplegia affecting unspecified side: Secondary | ICD-10-CM | POA: Diagnosis not present

## 2013-10-22 MED ORDER — ONABOTULINUMTOXINA 100 UNITS IJ SOLR
500.0000 [IU] | Freq: Once | INTRAMUSCULAR | Status: AC
  Administered 2013-10-22: 500 [IU] via INTRAMUSCULAR

## 2013-10-22 NOTE — Progress Notes (Signed)
HPI: [name] is a 55 year patient referred by Dr. Leonie Man for EMG guided Botulinumtoxin injection for spastic right hemiparesis.     He is enrolled in ASPIRE trial since Dec 2014.   He sufferred acute stroke in left subinsular cortex extending into corona radiata and basal ganglia in June 2011, CT angiogram showed left ICA occlusion in neck with recanalization of left terminal ICA and MCA via collaterals.  After rehab, he is now wheelchair dependent, with spastic right hemiparesis, able to walk with walker and asssistant. He continues to have significant lack of functional use of right hand with nonfixed flexion contractures. He denies signficiant pain.  He has tried Xeomin injection, only with mild improvement, seems to respond better with BOTOX A.  His wife is doing passive stretching of right shoulder and elbow, he deneis signficiant pain, he wear wrist splint at night, he also use walking device to help his right ankle dorsiflexion.  He began to receive EMG guided BOTOX injection to his right upper and lower extremity since Feb 2012.  He still ambulate, but with increased difficulty, because of right lower extremity spasticity, also suffered right hip fracture require surgical treatment in August 2013.  UPDATE June 3rd 2015: Last BOTOX A  Injection was in June 3rd 2015, the injection was performed using electronic stimulation, He responded very well, no signficant side effect, he reported moderate improvement at his right leg, he can pick up his right foot better. He denies significant pain of his right arm, still ambulatory,   Physical Exam  Ears, Nose and Throat: Hearing is normal.  Neck: supple without bruit Respiratory: clear to auscultation Cardiovascular: no murmur or gallop Musculoskeletal: mild kyphosis Skin: few petechiae  Neurologic Exam  Mental Status: sit in wheelchair, pleasant cooperative. Cranial Nerves: CN II-XII mild right lower face weakness. Motor: Spastic right  hemiparesis. Right shoulder internal rotation, adduction, right elbow flexion, extension 3/5 , wrist in neutral position, bilateral finger flexion,  He has near normal range of motion of right elbow with passive movement.  He has moderate to severe right lower extremity spasticity, resistance through out passive movement,  Right hip flexion 2, knee flexion, 2, extension 3, right ankle plantar inversion, moderate right leg spasticity, right hammor toes,  Sensory: Touch and pinprick sensations are normal.   Coordination: normal Gait and Station: Right ankle plantar flexion, spastic right hemi-circumeferencial gait, with right knee locked in extension. Reflexes: Deep tendon reflexes are 2+ asymmetric and brisker on right.     Assessment and Plan: Under EMG guidance, 500 units of BOTOX A was used for right upper extremity spasticity,   100 units /2cc normal saline.   300 units in right UE, and 200  units in right LE, for total of 500 units  Right pronator teres 25 Right flexor carpi ulnaris 25 Right flexor digitorum profundus 25 Right pronator quadratus 25  Right pectoralis major 25 x2= 50 Right biceps 25 x2= 50  Right pectoralis major 25x2=50 Right brachialis 25 x2=50  Right adductor longus 25x2=50 units Right adductor magnus 25x2=50 units  Right tibialis posterior 25x2=50 units Right flexor digitorum longus 25x2=50 units    He tolerated the injection well, will come in three months for repeat injections,  He is enrolled in ASPIRE trial since Dec 2014.

## 2013-12-15 ENCOUNTER — Other Ambulatory Visit: Payer: Self-pay | Admitting: Family Medicine

## 2014-01-21 ENCOUNTER — Encounter: Payer: Self-pay | Admitting: Neurology

## 2014-01-21 ENCOUNTER — Ambulatory Visit (INDEPENDENT_AMBULATORY_CARE_PROVIDER_SITE_OTHER): Payer: Medicare Other | Admitting: Neurology

## 2014-01-21 VITALS — BP 119/73 | HR 73 | Wt 175.0 lb

## 2014-01-21 DIAGNOSIS — G8110 Spastic hemiplegia affecting unspecified side: Secondary | ICD-10-CM | POA: Diagnosis not present

## 2014-01-21 MED ORDER — ONABOTULINUMTOXINA 100 UNITS IJ SOLR
500.0000 [IU] | Freq: Once | INTRAMUSCULAR | Status: AC
  Administered 2014-01-21: 500 [IU] via INTRAMUSCULAR

## 2014-01-21 NOTE — Progress Notes (Signed)
HPI: [name] is a 27 year patient referred by Dr. Leonie Man for EMG guided Botulinumtoxin injection for spastic right hemiparesis.     He is enrolled in ASPIRE trial since Dec 2014.   He sufferred acute stroke in left subinsular cortex extending into corona radiata and basal ganglia in June 2011, CT angiogram showed left ICA occlusion in neck with recanalization of left terminal ICA and MCA via collaterals.  After rehab, he is now wheelchair dependent, with spastic right hemiparesis, able to walk with walker and asssistant. He continues to have significant lack of functional use of right hand with nonfixed flexion contractures. He denies signficiant pain.  He has tried Xeomin injection, only with mild improvement, seems to respond better with BOTOX A.  His wife is doing passive stretching of right shoulder and elbow, he deneis signficiant pain, he wear wrist splint at night, he also use walking device to help his right ankle dorsiflexion.  He began to receive EMG guided BOTOX injection to his right upper and lower extremity since Feb 2012.  He still ambulate, but with increased difficulty, because of right lower extremity spasticity, also suffered right hip fracture require surgical treatment in August 2013.  UPDATE September second 2015:  Last BOTOX A  Injection was in June 3rd 2015, the injection was performed using electronic stimulation, He responded very well, no signficant side effect, he reported moderate improvement at his right leg, he can pick up his right foot better. He denies significant pain of his right arm, still ambulatory,   His wife was recently diagnosed with malignant colon polyps, going through treatment,  Physical Exam  Ears, Nose and Throat: Hearing is normal.  Neck: supple without bruit Respiratory: clear to auscultation Cardiovascular: no murmur or gallop Musculoskeletal: mild kyphosis Skin: few petechiae  Neurologic Exam  Mental Status: sit in wheelchair,  pleasant cooperative. Cranial Nerves: CN II-XII mild right lower face weakness. Motor: Spastic right hemiparesis. Right shoulder internal rotation, adduction, right elbow flexion, extension 3/5 , wrist in neutral position, bilateral finger flexion,  He has near normal range of motion of right elbow with passive movement.  He has moderate to severe right lower extremity spasticity, resistance through out passive movement,  Right hip flexion 2, knee flexion, 2, extension 3, right ankle plantar inversion, moderate right leg spasticity, right hammor toes,  Sensory: Touch and pinprick sensations are normal.   Coordination: normal Gait and Station: Right ankle plantar flexion, spastic right hemi-circumeferencial gait, with right knee locked in extension. Reflexes: Deep tendon reflexes are 2+ asymmetric and brisker on right.     Assessment and Plan: Under EMG guidance, 500 units of BOTOX A was used for right upper extremity spasticity,   100 units /2cc normal saline.   300 units in right UE, and 200  units in right LE, for total of 500 units  Right pronator teres 25 Right flexor carpi ulnaris 25 Right flexor digitorum profundus 25 Right palmaris longus 25  Right flexor carpi radialis 25  Right pectoralis major 25 Right teres major 25  Right biceps 25 x2= 50  Right brachialis 25 x 3 = 75  Right adductor longus 25x2=50 units Right adductor magnus 25x2=50 units  Right tibialis posterior 25x2=50 units Right flexor digitorum longus 25 Right medial gastrocnemius 25   He tolerated the injection well, will come in three months for repeat injections,  He is enrolled in ASPIRE trial since Dec 2014.

## 2014-02-02 ENCOUNTER — Other Ambulatory Visit: Payer: Self-pay | Admitting: Family Medicine

## 2014-02-02 MED ORDER — AMLODIPINE BESYLATE 10 MG PO TABS: ORAL_TABLET | ORAL | Status: DC

## 2014-02-25 DIAGNOSIS — Z96641 Presence of right artificial hip joint: Secondary | ICD-10-CM | POA: Diagnosis not present

## 2014-02-25 DIAGNOSIS — S72101D Unspecified trochanteric fracture of right femur, subsequent encounter for closed fracture with routine healing: Secondary | ICD-10-CM | POA: Diagnosis not present

## 2014-03-17 ENCOUNTER — Ambulatory Visit: Payer: Medicare Other | Admitting: Family Medicine

## 2014-03-17 ENCOUNTER — Ambulatory Visit (INDEPENDENT_AMBULATORY_CARE_PROVIDER_SITE_OTHER): Payer: Medicare Other | Admitting: Family Medicine

## 2014-03-17 ENCOUNTER — Encounter: Payer: Self-pay | Admitting: Family Medicine

## 2014-03-17 VITALS — BP 110/60 | HR 70 | Wt 175.0 lb

## 2014-03-17 DIAGNOSIS — I6523 Occlusion and stenosis of bilateral carotid arteries: Secondary | ICD-10-CM

## 2014-03-17 DIAGNOSIS — I4510 Unspecified right bundle-branch block: Secondary | ICD-10-CM | POA: Diagnosis not present

## 2014-03-17 DIAGNOSIS — G8110 Spastic hemiplegia affecting unspecified side: Secondary | ICD-10-CM | POA: Diagnosis not present

## 2014-03-17 DIAGNOSIS — Z23 Encounter for immunization: Secondary | ICD-10-CM

## 2014-03-17 DIAGNOSIS — E785 Hyperlipidemia, unspecified: Secondary | ICD-10-CM

## 2014-03-17 DIAGNOSIS — I10 Essential (primary) hypertension: Secondary | ICD-10-CM | POA: Diagnosis not present

## 2014-03-17 DIAGNOSIS — I69959 Hemiplegia and hemiparesis following unspecified cerebrovascular disease affecting unspecified side: Secondary | ICD-10-CM | POA: Diagnosis not present

## 2014-03-17 LAB — CBC WITH DIFFERENTIAL/PLATELET
Basophils Absolute: 0.1 10*3/uL (ref 0.0–0.1)
Basophils Relative: 1 % (ref 0–1)
EOS ABS: 0.1 10*3/uL (ref 0.0–0.7)
Eosinophils Relative: 1 % (ref 0–5)
HEMATOCRIT: 44.8 % (ref 39.0–52.0)
HEMOGLOBIN: 15.8 g/dL (ref 13.0–17.0)
Lymphocytes Relative: 14 % (ref 12–46)
Lymphs Abs: 1.2 10*3/uL (ref 0.7–4.0)
MCH: 32.8 pg (ref 26.0–34.0)
MCHC: 35.3 g/dL (ref 30.0–36.0)
MCV: 92.9 fL (ref 78.0–100.0)
MONO ABS: 0.8 10*3/uL (ref 0.1–1.0)
MONOS PCT: 10 % (ref 3–12)
NEUTROS ABS: 6.2 10*3/uL (ref 1.7–7.7)
NEUTROS PCT: 74 % (ref 43–77)
Platelets: 283 10*3/uL (ref 150–400)
RBC: 4.82 MIL/uL (ref 4.22–5.81)
RDW: 14.5 % (ref 11.5–15.5)
WBC: 8.4 10*3/uL (ref 4.0–10.5)

## 2014-03-17 LAB — COMPREHENSIVE METABOLIC PANEL
ALT: 31 U/L (ref 0–53)
AST: 20 U/L (ref 0–37)
Albumin: 4.2 g/dL (ref 3.5–5.2)
Alkaline Phosphatase: 57 U/L (ref 39–117)
BILIRUBIN TOTAL: 0.8 mg/dL (ref 0.2–1.2)
BUN: 22 mg/dL (ref 6–23)
CO2: 24 mEq/L (ref 19–32)
Calcium: 9 mg/dL (ref 8.4–10.5)
Chloride: 99 mEq/L (ref 96–112)
Creat: 0.92 mg/dL (ref 0.50–1.35)
GLUCOSE: 82 mg/dL (ref 70–99)
POTASSIUM: 4.6 meq/L (ref 3.5–5.3)
Sodium: 135 mEq/L (ref 135–145)
Total Protein: 7.2 g/dL (ref 6.0–8.3)

## 2014-03-17 LAB — LIPID PANEL
CHOL/HDL RATIO: 2.5 ratio
Cholesterol: 112 mg/dL (ref 0–200)
HDL: 44 mg/dL (ref >39)
LDL CALC: 50 mg/dL (ref 0–99)
TRIGLYCERIDES: 90 mg/dL (ref <150)
VLDL: 18 mg/dL (ref 0–40)

## 2014-03-17 MED ORDER — SIMVASTATIN 20 MG PO TABS: ORAL_TABLET | ORAL | Status: DC

## 2014-03-17 MED ORDER — LISINOPRIL 10 MG PO TABS
10.0000 mg | ORAL_TABLET | Freq: Every day | ORAL | Status: DC
Start: 2014-03-17 — End: 2015-03-26

## 2014-03-17 MED ORDER — AMLODIPINE BESYLATE 10 MG PO TABS: ORAL_TABLET | ORAL | Status: DC

## 2014-03-17 MED ORDER — CLOPIDOGREL BISULFATE 75 MG PO TABS: ORAL_TABLET | ORAL | Status: DC

## 2014-03-17 MED ORDER — TIZANIDINE HCL 2 MG PO TABS: ORAL_TABLET | ORAL | Status: DC

## 2014-03-17 MED ORDER — BACLOFEN 10 MG PO TABS: 10.0000 mg | ORAL_TABLET | Freq: Three times a day (TID) | ORAL | Status: DC

## 2014-03-17 NOTE — Progress Notes (Signed)
   Subjective:    Patient ID: Richard Kent, male    DOB: 09-Feb-1929, 80 y.o.   MRN: [id number]  HPI He is here for an interval evaluation. He does need his immunizations updated. He does have a history of CVA with spastic components. He is on Zanaflex and baclofen for this. This seems to be doing well. He does take Zocor and is having no difficulty with that. He does have an underlying history of RBBB as well as carotid stenosis. Apparently surgery is not an option. His wife takes good care of him. He has no particular concerns or complaints.   Review of Systems     Objective:   Physical Exam alert and in no distress. Tympanic membranes and canals are normal. Throat is clear. Tonsils are normal. Neck is supple without adenopathy or thyromegaly. Cardiac exam shows a regular sinus rhythm without murmurs or gallops. Lungs are clear to auscultation.        Assessment & Plan:  Spastic hemiplegia affecting dominant side - Plan: baclofen (LIORESAL) 10 MG tablet, tiZANidine (ZANAFLEX) 2 MG tablet  Need for prophylactic vaccination against Streptococcus pneumoniae (pneumococcus) - Plan: Pneumococcal conjugate vaccine 13-valent  Need for prophylactic vaccination and inoculation against influenza - Plan: Flu vaccine HIGH DOSE PF (Fluzone Tri High dose)  Hyperlipidemia LDL goal <100 - Plan: simvastatin (ZOCOR) 20 MG tablet, Lipid panel  RBBB - Plan: CBC with Differential, Comprehensive metabolic panel, Lipid panel  Hemiplegia, late effect of cerebrovascular disease - Plan: baclofen (LIORESAL) 10 MG tablet, tiZANidine (ZANAFLEX) 2 MG tablet, clopidogrel (PLAVIX) 75 MG tablet, CBC with Differential, Comprehensive metabolic panel, Lipid panel  Essential hypertension - Plan: lisinopril (PRINIVIL,ZESTRIL) 10 MG tablet, amLODipine (NORVASC) 10 MG tablet, CBC with Differential, Comprehensive metabolic panel, Lipid panel  Carotid stenosis, bilateral  continue on present medications. Discussed the  judicious use of alcohol. Did say that one drink per day is certainly not unreasonable. Also discussed end-of-life issues especially getting an advanced directive and a will.

## 2014-03-19 ENCOUNTER — Other Ambulatory Visit: Payer: Self-pay

## 2014-04-22 ENCOUNTER — Ambulatory Visit (INDEPENDENT_AMBULATORY_CARE_PROVIDER_SITE_OTHER): Payer: Medicare Other | Admitting: Neurology

## 2014-04-22 ENCOUNTER — Encounter: Payer: Self-pay | Admitting: Neurology

## 2014-04-22 DIAGNOSIS — G8111 Spastic hemiplegia affecting right dominant side: Secondary | ICD-10-CM

## 2014-04-22 DIAGNOSIS — I6523 Occlusion and stenosis of bilateral carotid arteries: Secondary | ICD-10-CM

## 2014-04-22 DIAGNOSIS — I10 Essential (primary) hypertension: Secondary | ICD-10-CM

## 2014-04-22 DIAGNOSIS — G8110 Spastic hemiplegia affecting unspecified side: Secondary | ICD-10-CM

## 2014-04-22 DIAGNOSIS — I69959 Hemiplegia and hemiparesis following unspecified cerebrovascular disease affecting unspecified side: Secondary | ICD-10-CM

## 2014-04-22 MED ORDER — ONABOTULINUMTOXINA 100 UNITS IJ SOLR
500.0000 [IU] | Freq: Once | INTRAMUSCULAR | Status: AC
  Administered 2014-04-22: 500 [IU] via INTRAMUSCULAR

## 2014-04-22 NOTE — Progress Notes (Signed)
HPI: Richard Kent is a 60 year patient referred by Dr. Leonie Man for EMG guided Botulinumtoxin injection for spastic right hemiparesis.     He is enrolled in ASPIRE trial since Dec 2014.   He sufferred acute stroke in left subinsular cortex extending into corona radiata and basal ganglia in June 2011, CT angiogram showed left ICA occlusion in neck with recanalization of left terminal ICA and MCA via collaterals.  After rehab, he is now wheelchair dependent, with spastic right hemiparesis, able to walk with walker and asssistant. He continues to have significant lack of functional use of right hand with nonfixed flexion contractures. He denies signficiant pain.  He has tried Xeomin injection, only with mild improvement, seems to respond better with BOTOX A.  His wife is doing passive stretching of right shoulder and elbow, he deneis signficiant pain, he wear wrist splint at night, he also use walking device to help his right ankle dorsiflexion.  He began to receive EMG guided BOTOX injection to his right upper and lower extremity since Feb 2012.  He still ambulate, but with increased difficulty, because of right lower extremity spasticity, also suffered right hip fracture require surgical treatment in August 2013.  His wife was recently diagnosed with malignant colon polyps, going through treatment,  UPDATE Dec 2nd 2015: Last injection in September 2015 has been very helpful, he denies significant side effect, today, he complains that his right big toe has constant extension, rubbing against his shoe, it can be painful,  Physical Exam  Ears, Nose and Throat: Hearing is normal.  Neck: supple without bruit Respiratory: clear to auscultation Cardiovascular: no murmur or gallop Musculoskeletal: mild kyphosis Skin: few petechiae  Neurologic Exam  Mental Status: sit in wheelchair, pleasant cooperative. Cranial Nerves: CN II-XII mild right lower face weakness. Motor: Spastic right hemiparesis.  Right shoulder internal rotation, adduction, right elbow flexion, extension 3/5 , wrist in neutral position, bilateral finger flexion,  He has near normal range of motion of right elbow with passive movement.  He has moderate to severe right lower extremity spasticity, resistance through out passive movement,  Right hip flexion 2, knee flexion, 2, extension 3, right ankle plantar inversion, moderate right leg spasticity, right hammor toes,  Sensory: Touch and pinprick sensations are normal.   Coordination: normal Gait and Station: Right ankle plantar flexion, spastic right hemi-circumeferencial gait, with right knee locked in extension. Reflexes: Deep tendon reflexes are 2+ asymmetric and brisker on right.     Assessment and Plan: Under EMG guidance, 500 units of BOTOX A was used for right upper extremity spasticity,   100 units /2cc normal saline.    Right pronator teres 25 Right flexor carpi ulnaris 25 Right flexor digitorum profundus 25 Right palmaris longus 25  Right flexor carpi radialis 25  Right pectoralis major 25x2= 50  Right biceps 25 x2= 50  Right brachialis 25 x 3 = 75 Right lumbricals 3 injection for total of 50 units     Right tibialis posterior 25x2=50 units Right flexor digitorum longus 25x2= 50 Right extensor hallucis longus 25x2= 50 Right tibialis anterior 25   He tolerated the injection well, will come in three months for repeat injections,  He is enrolled in ASPIRE trial since Dec 2014.

## 2014-07-21 ENCOUNTER — Encounter: Payer: Self-pay | Admitting: *Deleted

## 2014-07-22 ENCOUNTER — Ambulatory Visit (INDEPENDENT_AMBULATORY_CARE_PROVIDER_SITE_OTHER): Payer: Medicare Other | Admitting: Neurology

## 2014-07-22 VITALS — BP 118/62 | HR 65

## 2014-07-22 DIAGNOSIS — G8111 Spastic hemiplegia affecting right dominant side: Secondary | ICD-10-CM | POA: Diagnosis not present

## 2014-07-22 MED ORDER — ONABOTULINUMTOXINA 100 UNITS IJ SOLR
500.0000 [IU] | Freq: Once | INTRAMUSCULAR | Status: AC
  Administered 2014-07-22: 500 [IU] via INTRAMUSCULAR

## 2014-07-22 NOTE — Progress Notes (Signed)
HPI: Richard Kent is a 79 year patient referred by Dr. Leonie Man for EMG guided Botulinumtoxin injection for spastic right hemiparesis.     He is enrolled in ASPIRE trial since Dec 2014.   He sufferred acute stroke in left subinsular cortex extending into corona radiata and basal ganglia in June 2011, CT angiogram showed left ICA occlusion in neck with recanalization of left terminal ICA and MCA via collaterals.  After rehab, he is now wheelchair dependent, with spastic right hemiparesis, able to walk with walker and asssistant. He continues to have significant lack of functional use of right hand with nonfixed flexion contractures. He denies signficiant pain.  He has tried Xeomin injection, only with mild improvement, seems to respond better with BOTOX A.  His wife is doing passive stretching of right shoulder and elbow, he deneis signficiant pain, he wear wrist splint at night, he also use walking device to help his right ankle dorsiflexion.  He began to receive EMG guided BOTOX injection to his right upper and lower extremity since Feb 2012.  He still ambulate, but with increased difficulty, because of right lower extremity spasticity, also suffered right hip fracture require surgical treatment in August 2013, has right side electronic walking aid.  His wife was recently diagnosed with malignant colon polyps, going through treatment,  UPDATE March 2nd 2016: Last injection in Dec 2015, which has been very helpful, he denies significant side effect, today, he complains that his right big toe has constant extension, rubbing against his shoe, it can be painful,  Physical Exam  Ears, Nose and Throat: Hearing is normal.  Neck: supple without bruit Respiratory: clear to auscultation Cardiovascular: no murmur or gallop Musculoskeletal: mild kyphosis Skin: few petechiae  Neurologic Exam  Mental Status: sit in wheelchair, pleasant cooperative. Cranial Nerves: CN II-XII mild right lower face  weakness. Motor: Spastic right hemiparesis. Right shoulder internal rotation, adduction, right elbow flexion, extension 3/5 , wrist in neutral position, bilateral finger flexion,  He has near normal range of motion of right elbow with passive movement.  He has moderate to severe right lower extremity spasticity, resistance through out passive movement,  Right hip flexion 2, knee flexion, 2, extension 3, right ankle plantar inversion, moderate right leg spasticity, right hammor toes,  Sensory: Touch and pinprick sensations are normal.   Coordination: normal Gait and Station: Right ankle plantar flexion, spastic right hemi-circumeferencial gait, with right knee locked in extension. Reflexes: Deep tendon reflexes are 2+ asymmetric and brisker on right.    Assessment and Plan: Under EMG guidance, 500 units of BOTOX A was used for right upper extremity and lower spasticity,   100 units /2cc normal saline.   Right pronator teres 25x2=50 Right flexor carpi ulnaris 25 Right flexor digitorum profundus 25 Right palmaris longus 25  Right flexor carpi radialis 25  Right pectoralis major 25x2= 50  Right biceps 25 x2= 50  Right brachialis 25 x 2= 50 Right teres major 25 units   Right tibialis posterior 25x2=50 units Right flexor digitorum longus 25x2= 50 Right rectus femoris 12.5 Right vastus medialis 25 Right abductor magnus 37.5   He tolerated the injection well, will come in three months for repeat injections.  Marcial Pacas, M.D. Ph.D.  [facility] [name], [place] [address] Phone: [phone] Fax:      [phone]

## 2014-09-10 ENCOUNTER — Encounter: Payer: Self-pay | Admitting: Neurology

## 2014-09-10 ENCOUNTER — Telehealth: Payer: Self-pay | Admitting: Neurology

## 2014-09-10 NOTE — Telephone Encounter (Signed)
mailed letter to inform patient of workflow change and discontinuing injections.

## 2014-09-18 ENCOUNTER — Encounter: Payer: Self-pay | Admitting: Gastroenterology

## 2014-12-07 ENCOUNTER — Encounter (HOSPITAL_COMMUNITY): Payer: Self-pay | Admitting: Emergency Medicine

## 2014-12-07 ENCOUNTER — Emergency Department (HOSPITAL_COMMUNITY): Payer: Medicare Other

## 2014-12-07 ENCOUNTER — Emergency Department (HOSPITAL_COMMUNITY)
Admission: EM | Admit: 2014-12-07 | Discharge: 2014-12-07 | Disposition: A | Discharge status: Home / Self Care | Payer: Medicare Other | Attending: Emergency Medicine | Admitting: Emergency Medicine

## 2014-12-07 DIAGNOSIS — Z7982 Long term (current) use of aspirin: Secondary | ICD-10-CM | POA: Insufficient documentation

## 2014-12-07 DIAGNOSIS — Z8719 Personal history of other diseases of the digestive system: Secondary | ICD-10-CM | POA: Diagnosis not present

## 2014-12-07 DIAGNOSIS — Y9389 Activity, other specified: Secondary | ICD-10-CM | POA: Diagnosis not present

## 2014-12-07 DIAGNOSIS — Z87442 Personal history of urinary calculi: Secondary | ICD-10-CM | POA: Diagnosis not present

## 2014-12-07 DIAGNOSIS — W1839XA Other fall on same level, initial encounter: Secondary | ICD-10-CM | POA: Diagnosis not present

## 2014-12-07 DIAGNOSIS — Y92009 Unspecified place in unspecified non-institutional (private) residence as the place of occurrence of the external cause: Secondary | ICD-10-CM | POA: Diagnosis not present

## 2014-12-07 DIAGNOSIS — S79911A Unspecified injury of right hip, initial encounter: Secondary | ICD-10-CM | POA: Diagnosis present

## 2014-12-07 DIAGNOSIS — Z87891 Personal history of nicotine dependence: Secondary | ICD-10-CM | POA: Diagnosis not present

## 2014-12-07 DIAGNOSIS — Z79899 Other long term (current) drug therapy: Secondary | ICD-10-CM | POA: Diagnosis not present

## 2014-12-07 DIAGNOSIS — R52 Pain, unspecified: Secondary | ICD-10-CM

## 2014-12-07 DIAGNOSIS — R0902 Hypoxemia: Secondary | ICD-10-CM | POA: Diagnosis not present

## 2014-12-07 DIAGNOSIS — I10 Essential (primary) hypertension: Secondary | ICD-10-CM | POA: Diagnosis not present

## 2014-12-07 DIAGNOSIS — W19XXXA Unspecified fall, initial encounter: Secondary | ICD-10-CM

## 2014-12-07 DIAGNOSIS — M25551 Pain in right hip: Secondary | ICD-10-CM | POA: Diagnosis not present

## 2014-12-07 DIAGNOSIS — Z7902 Long term (current) use of antithrombotics/antiplatelets: Secondary | ICD-10-CM | POA: Insufficient documentation

## 2014-12-07 DIAGNOSIS — M79604 Pain in right leg: Secondary | ICD-10-CM | POA: Diagnosis not present

## 2014-12-07 DIAGNOSIS — Y998 Other external cause status: Secondary | ICD-10-CM | POA: Diagnosis not present

## 2014-12-07 DIAGNOSIS — Z8673 Personal history of transient ischemic attack (TIA), and cerebral infarction without residual deficits: Secondary | ICD-10-CM | POA: Insufficient documentation

## 2014-12-07 MED ORDER — TRAMADOL HCL 50 MG PO TABS: 50.0000 mg | ORAL_TABLET | Freq: Three times a day (TID) | ORAL | Status: DC | PRN

## 2014-12-07 MED ORDER — TRAMADOL HCL 50 MG PO TABS
50.0000 mg | ORAL_TABLET | Freq: Once | ORAL | Status: AC
  Administered 2014-12-07: 50 mg via ORAL
  Filled 2014-12-07: qty 1

## 2014-12-07 NOTE — ED Provider Notes (Signed)
CSN: [id number]     Arrival date & time 12/07/14  1804 History   First MD Initiated Contact with Patient 12/07/14 1808     Chief Complaint  Patient presents with  . Hip Pain     (Consider location/radiation/quality/duration/timing/severity/associated sxs/prior Treatment) Patient is a 81 y.o. male presenting with hip pain. The history is provided by the patient.  Hip Pain Pertinent negatives include no chest pain, no abdominal pain, no headaches and no shortness of breath.  Patient s/p fall at home yesterday, was bending at knees/waist to pick something up when lost balance, landing on buttocks. Reports being able to walk yesterday, but not today due to right hip pain. Hip pain constant, dull, moderate, worse w wt bearing. No radicular pain. No new numbness/weakness. No head injury or headache. No neck or back pain. Denies any other pain or injury.     Past Medical History  Diagnosis Date  . Hypertension   . CVA (cerebral infarction) 2011  . Diverticulosis   . Smoker   . Carotid stenosis   . Kidney stone   . Stroke 11/17/09   Past Surgical History  Procedure Laterality Date  . Hernia repair      RIGHT  . Appendectomy    . Hip arthroplasty  01/15/2012    Procedure: ARTHROPLASTY BIPOLAR HIP;  Surgeon: Rozanna Box, MD;  Location: [facility];  Service: Orthopedics;  Laterality: Right;   Family History  Problem Relation Age of Onset  . Cancer Mother   . Stroke Mother   . Heart disease Father   . Cervical cancer Sister   . Heart attack Sister    History  Substance Use Topics  . Smoking status: Former Smoker -- 2.00 packs/day for 20 years    Types: Cigarettes    Quit date: 01/13/1972  . Smokeless tobacco: Never Used  . Alcohol Use: No     Comment: Patient quit alcohol 11/17/2009    Review of Systems  Constitutional: Negative for fever.  HENT: Negative for sore throat.   Eyes: Negative for redness.  Respiratory: Negative for shortness of breath.   Cardiovascular:  Negative for chest pain and leg swelling.  Gastrointestinal: Negative for abdominal pain.  Genitourinary: Negative for flank pain.  Musculoskeletal: Negative for back pain and neck pain.  Skin: Negative for rash.  Neurological: Negative for weakness, numbness and headaches.  Hematological: Does not bruise/bleed easily.  Psychiatric/Behavioral: Negative for confusion.      Allergies  Review of patient's allergies indicates no known allergies.  Home Medications   Prior to Admission medications   Medication Sig Start Date End Date Taking? Authorizing Provider  amLODipine (NORVASC) 10 MG tablet TAKE ONE TABLET BY MOUTH ONCE DAILY 03/17/14   Denita Lung, MD  Ascorbic Acid (VITAMIN C) 1000 MG tablet Take 1,000 mg by mouth daily.    Historical Provider, MD  aspirin 81 MG tablet Take 81 mg by mouth every morning.     Historical Provider, MD  baclofen (LIORESAL) 10 MG tablet Take 1 tablet (10 mg total) by mouth 3 (three) times daily. 03/17/14   Denita Lung, MD  botulinum toxin Type A (BOTOX) 100 UNITS SOLR injection Inject 500 Units into the muscle once.    Historical Provider, MD  clopidogrel (PLAVIX) 75 MG tablet TAKE ONE TABLET BY MOUTH IN THE MORNING. 03/17/14   Denita Lung, MD  lisinopril (PRINIVIL,ZESTRIL) 10 MG tablet Take 1 tablet (10 mg total) by mouth daily. 03/17/14   Elyse Jarvis  [employer], MD  Multiple Vitamin (MULTIVITAMIN WITH MINERALS) TABS Take 1 tablet by mouth every morning.    Historical Provider, MD  SENNOSIDES-DOCUSATE SODIUM PO Take by mouth. At bedtime two tablets.    Historical Provider, MD  simvastatin (ZOCOR) 20 MG tablet TAKE ONE TABLET BY MOUTH EVERY EVENING. 03/17/14   Denita Lung, MD  tiZANidine (ZANAFLEX) 2 MG tablet TAKE ONE TABLET BY MOUTH TWICE DAILY 03/17/14   Denita Lung, MD   Temp(Src) 97.6 F (36.4 C) (Oral) Physical Exam  Constitutional: He is oriented to person, place, and time. He appears well-developed and well-nourished. No distress.  HENT:   Head: Atraumatic.  Mouth/Throat: Oropharynx is clear and moist.  Eyes: Conjunctivae are normal. No scleral icterus.  Neck: Neck supple. No tracheal deviation present.  Cardiovascular: Normal rate, regular rhythm, normal heart sounds and intact distal pulses.   Pulmonary/Chest: Effort normal and breath sounds normal. No accessory muscle usage. No respiratory distress.  Abdominal: Soft. Bowel sounds are normal. He exhibits no distension. There is no tenderness.  Musculoskeletal: Normal range of motion. He exhibits tenderness. He exhibits no edema.  Tenderness right hip, otherwise good rom bil ext without pain or focal bony tenderness.  No rotational deformity noted. Distal pulses palp.  CTLS spine, non tender, aligned, no step off.   Neurological: He is alert and oriented to person, place, and time.  Skin: Skin is warm and dry. No rash noted. He is not diaphoretic.  Psychiatric: He has a normal mood and affect.  Nursing note and vitals reviewed.   ED Course  Procedures (including critical care time) Labs Review  Dg Hip Unilat W Or W/o Pelvis 2-3 Views Right  12/07/2014   CLINICAL DATA:  pt fell yesterday at home, fell onto bottom, denies pain - reports stiffness yesterday, able to ambulate yesterday - however not able to bear weight or walk today. Denies LOC. Denies hitting head. Hx of stroke 5 years ago.  EXAM: DG HIP (WITH OR WITHOUT PELVIS) 2-3V RIGHT  COMPARISON:  None.  FINDINGS: No fracture. Right hip hemi arthroplasty is well-seated and aligned. Bones are demineralized. Vascular calcifications noted along the iliac and femoral arteries.  IMPRESSION: No fracture or dislocation. No evidence of disruption of the right hip prosthesis.   Electronically Signed   By: Lajean Manes M.D.   On: 12/07/2014 18:45       MDM   Xrays.  Reviewed nursing notes and prior charts for additional history.   xrays neg acute.  w hx initially walking fine post fall, and given mechanism, suspect  possible muscle strain/tear.  TLS spine non tender, and no back pain. Good rom bil hip, knee and ankle without pain.   Ultram po.  Ambulate w assist.  Pt currently appears stable for d/c.      Lajean Saver, MD 12/07/14 9449

## 2014-12-07 NOTE — ED Notes (Signed)
Per EMS - pt fell yesterday at home, fell onto bottom, denies pain - reports stiffness yesterday, able to ambulate yesterday - however not able to bear weight or walk today. Denies LOC. Denies hitting head. Hx of stroke 5 years ago. No obvious deformity. A/o x4. VSS.

## 2014-12-07 NOTE — ED Notes (Signed)
Pt stable, ambulatory, states understanding of discharge instructions 

## 2014-12-07 NOTE — Discharge Instructions (Signed)
It was our pleasure to provide your ER care today - we hope that you feel better.  You may try either motrin or aleve as need for pain - take with food.  You may also take ultram as need for pain - may make drowsy, no driving when taking.  Fall precautions.  Use walker, assistance when walking.  Follow up with primary care doctor in 1 week if symptoms fail to improve/resolve (although your xrays today were read as showing no acute fracture, there is a small chance of an occult fracture).   Return to ER if worse, new symptoms, fevers, weak/faint, worsening or intractable pain, other concern.     Hip Pain Your hip is the joint between your upper legs and your lower pelvis. The bones, cartilage, tendons, and muscles of your hip joint perform a lot of work each day supporting your body weight and allowing you to move around. Hip pain can range from a minor ache to severe pain in one or both of your hips. Pain may be felt on the inside of the hip joint near the groin, or the outside near the buttocks and upper thigh. You may have swelling or stiffness as well.  HOME CARE INSTRUCTIONS   Take medicines only as directed by your health care provider.  Apply ice to the injured area:  Put ice in a plastic bag.  Place a towel between your skin and the bag.  Leave the ice on for 15-20 minutes at a time, 3-4 times a day.  Keep your leg raised (elevated) when possible to lessen swelling.  Avoid activities that cause pain.  Follow specific exercises as directed by your health care provider.  Sleep with a pillow between your legs on your most comfortable side.  Record how often you have hip pain, the location of the pain, and what it feels like. SEEK MEDICAL CARE IF:   You are unable to put weight on your leg.  Your hip is red or swollen or very tender to touch.  Your pain or swelling continues or worsens after 1 week.  You have increasing difficulty walking.  You have a fever. SEEK  IMMEDIATE MEDICAL CARE IF:   You have fallen.  You have a sudden increase in pain and swelling in your hip. MAKE SURE YOU:   Understand these instructions.  Will watch your condition.  Will get help right away if you are not doing well or get worse. Document Released: 10/26/2009 Document Revised: 09/22/2013 Document Reviewed: 01/02/2013 [facility] Patient Information 2015 [place], [place]. This information is not intended to replace advice given to you by your health care provider. Make sure you discuss any questions you have with your health care provider.   Fall Prevention and Home Safety Falls cause injuries and can affect all age groups. It is possible to use preventive measures to significantly decrease the likelihood of falls. There are many simple measures which can make your home safer and prevent falls. OUTDOORS  Repair cracks and edges of walkways and driveways.  Remove high doorway thresholds.  Trim shrubbery on the main path into your home.  Have good outside lighting.  Clear walkways of tools, rocks, debris, and clutter.  Check that handrails are not broken and are securely fastened. Both sides of steps should have handrails.  Have leaves, snow, and ice cleared regularly.  Use sand or salt on walkways during winter months.  In the garage, clean up grease or oil spills. BATHROOM  Install night lights.  Install grab bars by the toilet and in the tub and shower.  Use non-skid mats or decals in the tub or shower.  Place a plastic non-slip stool in the shower to sit on, if needed.  Keep floors dry and clean up all water on the floor immediately.  Remove soap buildup in the tub or shower on a regular basis.  Secure bath mats with non-slip, double-sided rug tape.  Remove throw rugs and tripping hazards from the floors. BEDROOMS  Install night lights.  Make sure a bedside light is easy to reach.  Do not use oversized bedding.  Keep a telephone by your  bedside.  Have a firm chair with side arms to use for getting dressed.  Remove throw rugs and tripping hazards from the floor. KITCHEN  Keep handles on pots and pans turned toward the center of the stove. Use back burners when possible.  Clean up spills quickly and allow time for drying.  Avoid walking on wet floors.  Avoid hot utensils and knives.  Position shelves so they are not too high or low.  Place commonly used objects within easy reach.  If necessary, use a sturdy step stool with a grab bar when reaching.  Keep electrical cables out of the way.  Do not use floor polish or wax that makes floors slippery. If you must use wax, use non-skid floor wax.  Remove throw rugs and tripping hazards from the floor. STAIRWAYS  Never leave objects on stairs.  Place handrails on both sides of stairways and use them. Fix any loose handrails. Make sure handrails on both sides of the stairways are as long as the stairs.  Check carpeting to make sure it is firmly attached along stairs. Make repairs to worn or loose carpet promptly.  Avoid placing throw rugs at the top or bottom of stairways, or properly secure the rug with carpet tape to prevent slippage. Get rid of throw rugs, if possible.  Have an electrician put in a light switch at the top and bottom of the stairs. OTHER FALL PREVENTION TIPS  Wear low-heel or rubber-soled shoes that are supportive and fit well. Wear closed toe shoes.  When using a stepladder, make sure it is fully opened and both spreaders are firmly locked. Do not climb a closed stepladder.  Add color or contrast paint or tape to grab bars and handrails in your home. Place contrasting color strips on first and last steps.  Learn and use mobility aids as needed. Install an electrical emergency response system.  Turn on lights to avoid dark areas. Replace light bulbs that burn out immediately. Get light switches that glow.  Arrange furniture to create clear  pathways. Keep furniture in the same place.  Firmly attach carpet with non-skid or double-sided tape.  Eliminate uneven floor surfaces.  Select a carpet pattern that does not visually hide the edge of steps.  Be aware of all pets. OTHER HOME SAFETY TIPS  Set the water temperature for 120 F (48.8 C).  Keep emergency numbers on or near the telephone.  Keep smoke detectors on every level of the home and near sleeping areas. Document Released: 04/28/2002 Document Revised: 11/07/2011 Document Reviewed: 07/28/2011 [facility] Patient Information 2015 [place], [place]. This information is not intended to replace advice given to you by your health care provider. Make sure you discuss any questions you have with your health care provider.

## 2014-12-07 NOTE — ED Notes (Signed)
Pt ambulated with moderate assistance. Pt walked with w/c to simulate a walker. Rn was in front to control speed of wheelchair and tech was behind pt making sure he did not fall backwards. Pt ambulated slowly and favored his R leg but was able to ambulate with w/c for assistance. Pt needed moderate assistance with standing up and sitting down.

## 2015-02-03 DIAGNOSIS — H5203 Hypermetropia, bilateral: Secondary | ICD-10-CM | POA: Diagnosis not present

## 2015-02-03 DIAGNOSIS — H25819 Combined forms of age-related cataract, unspecified eye: Secondary | ICD-10-CM | POA: Diagnosis not present

## 2015-02-03 DIAGNOSIS — H3531 Nonexudative age-related macular degeneration: Secondary | ICD-10-CM | POA: Diagnosis not present

## 2015-02-03 DIAGNOSIS — H52223 Regular astigmatism, bilateral: Secondary | ICD-10-CM | POA: Diagnosis not present

## 2015-03-04 ENCOUNTER — Other Ambulatory Visit (INDEPENDENT_AMBULATORY_CARE_PROVIDER_SITE_OTHER): Payer: Medicare Other

## 2015-03-04 DIAGNOSIS — Z23 Encounter for immunization: Secondary | ICD-10-CM

## 2015-03-09 ENCOUNTER — Telehealth: Payer: Self-pay

## 2015-03-09 NOTE — Telephone Encounter (Signed)
I spoke to the patient's spouse in regards to the [employer] study. I asked the Final Assessment questions and thanked them both for the participation in the study.

## 2015-03-11 ENCOUNTER — Other Ambulatory Visit: Payer: Self-pay | Admitting: Family Medicine

## 2015-03-11 NOTE — Telephone Encounter (Signed)
Is this okay?

## 2015-03-26 ENCOUNTER — Other Ambulatory Visit: Payer: Self-pay | Admitting: Family Medicine

## 2015-04-01 ENCOUNTER — Other Ambulatory Visit: Payer: Self-pay | Admitting: Family Medicine

## 2015-04-04 ENCOUNTER — Other Ambulatory Visit: Payer: Self-pay | Admitting: Family Medicine

## 2015-04-12 ENCOUNTER — Other Ambulatory Visit: Payer: Self-pay | Admitting: Family Medicine

## 2015-04-12 NOTE — Telephone Encounter (Signed)
Is this okay?

## 2015-04-29 ENCOUNTER — Other Ambulatory Visit: Payer: Self-pay | Admitting: Family Medicine

## 2015-06-25 ENCOUNTER — Other Ambulatory Visit: Payer: Self-pay | Admitting: Family Medicine

## 2015-07-01 ENCOUNTER — Other Ambulatory Visit: Payer: Self-pay | Admitting: Medical

## 2015-07-01 ENCOUNTER — Other Ambulatory Visit: Payer: Self-pay | Admitting: Family Medicine

## 2015-09-06 ENCOUNTER — Encounter: Payer: Self-pay | Admitting: Family Medicine

## 2015-09-06 ENCOUNTER — Ambulatory Visit (INDEPENDENT_AMBULATORY_CARE_PROVIDER_SITE_OTHER): Payer: Medicare Other | Admitting: Family Medicine

## 2015-09-06 VITALS — BP 126/60 | HR 65 | Ht 72.0 in | Wt 175.0 lb

## 2015-09-06 DIAGNOSIS — L739 Follicular disorder, unspecified: Secondary | ICD-10-CM

## 2015-09-06 DIAGNOSIS — J209 Acute bronchitis, unspecified: Secondary | ICD-10-CM | POA: Diagnosis not present

## 2015-09-06 MED ORDER — CLARITHROMYCIN 500 MG PO TABS
500.0000 mg | ORAL_TABLET | Freq: Two times a day (BID) | ORAL | Status: DC
Start: 2015-09-06 — End: 2017-01-09

## 2015-09-06 NOTE — Progress Notes (Signed)
   Subjective:    Patient ID: Richard Kent, male    DOB: [birth year], 82 y.o.   MRN: [id number]  HPI He has a two-month history of a productive cough but no fever, chills, sore throat, earache, weakness or fatigue. He also has a rash present on the face for the last month or so.   Review of Systems     Objective:   Physical Exam Alert and in no distress. Tympanic membranes and canals are normal. Pharyngeal area is normal. Neck is supple without adenopathy or thyromegaly. Cardiac exam shows a regular sinus rhythm without murmurs or gallops. Lungs are clear to auscultation. Exam of the face does show erythematous maculopapular lesions present on the malar area of the face as well as to lesser extent on the forehead.       Assessment & Plan:  Acute bronchitis, unspecified organism - Plan: clarithromycin (BIAXIN) [id number] MG tablet  Folliculitis - Plan: clarithromycin (BIAXIN) 500 MG tablet this does not appear to be rosacea but could possibly be a variation on it. The Biaxin should hopefully work on both. If further trouble he will call.

## 2015-09-10 ENCOUNTER — Other Ambulatory Visit: Payer: Self-pay | Admitting: Family Medicine

## 2015-09-10 NOTE — Telephone Encounter (Signed)
Is this okay to refill? 

## 2015-09-22 ENCOUNTER — Other Ambulatory Visit: Payer: Self-pay | Admitting: Family Medicine

## 2015-10-04 ENCOUNTER — Other Ambulatory Visit: Payer: Self-pay | Admitting: Family Medicine

## 2015-10-11 ENCOUNTER — Other Ambulatory Visit: Payer: Self-pay | Admitting: Family Medicine

## 2015-11-24 ENCOUNTER — Telehealth: Payer: Self-pay | Admitting: Family Medicine

## 2015-11-24 NOTE — Telephone Encounter (Signed)
Pt's wife, Stasia Cavalier, dropped off disability parking placard form to be completed. Call wife when completed.

## 2015-12-03 ENCOUNTER — Telehealth: Payer: Self-pay

## 2015-12-03 DIAGNOSIS — Z8673 Personal history of transient ischemic attack (TIA), and cerebral infarction without residual deficits: Secondary | ICD-10-CM

## 2015-12-03 DIAGNOSIS — Z636 Dependent relative needing care at home: Secondary | ICD-10-CM

## 2015-12-03 DIAGNOSIS — R296 Repeated falls: Secondary | ICD-10-CM

## 2015-12-03 NOTE — Telephone Encounter (Signed)
Home health referral done per Dorothea Ogle, PA

## 2015-12-09 ENCOUNTER — Telehealth: Payer: Self-pay | Admitting: Medical

## 2015-12-09 NOTE — Telephone Encounter (Signed)
Made [name], aware

## 2015-12-09 NOTE — Telephone Encounter (Signed)
I saw patient's wife recently and we ordered home health referal on Richard Kent given wife's report that he has hx/o stroke, requires total care by wife now, and she can't take care of his needs.

## 2015-12-24 ENCOUNTER — Other Ambulatory Visit: Payer: Self-pay | Admitting: Family Medicine

## 2016-01-10 ENCOUNTER — Other Ambulatory Visit: Payer: Self-pay | Admitting: Family Medicine

## 2016-01-28 ENCOUNTER — Other Ambulatory Visit (INDEPENDENT_AMBULATORY_CARE_PROVIDER_SITE_OTHER): Payer: Medicare Other

## 2016-01-28 DIAGNOSIS — Z23 Encounter for immunization: Secondary | ICD-10-CM

## 2016-03-21 ENCOUNTER — Other Ambulatory Visit: Payer: Self-pay | Admitting: Family Medicine

## 2016-03-28 ENCOUNTER — Other Ambulatory Visit: Payer: Self-pay | Admitting: Family Medicine

## 2016-04-07 ENCOUNTER — Other Ambulatory Visit: Payer: Self-pay | Admitting: Family Medicine

## 2016-04-24 ENCOUNTER — Telehealth: Payer: Self-pay | Admitting: Family Medicine

## 2016-04-24 ENCOUNTER — Other Ambulatory Visit: Payer: Self-pay

## 2016-04-24 NOTE — Telephone Encounter (Signed)
Pt's wife came in and stated that pt must now have a written rx for electrodes for his walkaide. I am sending back a copy of package so this can be written correctly. This goes to [employer]. A copy of the card is copies on same paper.

## 2016-07-04 ENCOUNTER — Telehealth: Payer: Self-pay | Admitting: Family Medicine

## 2016-07-04 ENCOUNTER — Other Ambulatory Visit: Payer: Self-pay

## 2016-07-04 DIAGNOSIS — Z209 Contact with and (suspected) exposure to unspecified communicable disease: Secondary | ICD-10-CM

## 2016-07-04 NOTE — Telephone Encounter (Signed)
Can you please put the future order in

## 2016-07-04 NOTE — Telephone Encounter (Signed)
Set him up for nurse visit for this.

## 2016-07-04 NOTE — Telephone Encounter (Signed)
Pt wife called and states that Richard Kent needs to come in for a HIV STD test he had a life insurance policy and it got canceled and for them to get a new one he has to have this testing done and have a paper signed for this, she wants to know if he can just come in for this she can be reached at [phone]

## 2016-07-05 ENCOUNTER — Other Ambulatory Visit: Payer: Medicare Other

## 2016-07-05 ENCOUNTER — Other Ambulatory Visit: Payer: Self-pay | Admitting: Family Medicine

## 2016-07-05 DIAGNOSIS — Z209 Contact with and (suspected) exposure to unspecified communicable disease: Secondary | ICD-10-CM

## 2016-07-06 LAB — RPR

## 2016-07-09 LAB — HIV ANTIBODY (ROUTINE TESTING W REFLEX): HIV 1&2 Ab, 4th Generation: NONREACTIVE

## 2016-09-04 ENCOUNTER — Other Ambulatory Visit: Payer: Self-pay | Admitting: Family Medicine

## 2016-09-25 ENCOUNTER — Other Ambulatory Visit: Payer: Self-pay | Admitting: Family Medicine

## 2016-09-27 ENCOUNTER — Other Ambulatory Visit: Payer: Self-pay | Admitting: Family Medicine

## 2016-09-27 NOTE — Telephone Encounter (Signed)
Is this okay to refill? 

## 2016-10-06 ENCOUNTER — Other Ambulatory Visit: Payer: Self-pay | Admitting: Family Medicine

## 2016-10-23 ENCOUNTER — Other Ambulatory Visit: Payer: Self-pay | Admitting: Family Medicine

## 2016-12-18 ENCOUNTER — Other Ambulatory Visit: Payer: Self-pay | Admitting: Family Medicine

## 2017-01-02 ENCOUNTER — Other Ambulatory Visit: Payer: Self-pay | Admitting: Family Medicine

## 2017-01-09 ENCOUNTER — Ambulatory Visit (INDEPENDENT_AMBULATORY_CARE_PROVIDER_SITE_OTHER): Payer: Medicare Other | Admitting: Family Medicine

## 2017-01-09 ENCOUNTER — Encounter: Payer: Self-pay | Admitting: Family Medicine

## 2017-01-09 VITALS — BP 106/58 | HR 67

## 2017-01-09 DIAGNOSIS — Z23 Encounter for immunization: Secondary | ICD-10-CM

## 2017-01-09 DIAGNOSIS — I10 Essential (primary) hypertension: Secondary | ICD-10-CM

## 2017-01-09 DIAGNOSIS — D692 Other nonthrombocytopenic purpura: Secondary | ICD-10-CM | POA: Diagnosis not present

## 2017-01-09 DIAGNOSIS — I69951 Hemiplegia and hemiparesis following unspecified cerebrovascular disease affecting right dominant side: Secondary | ICD-10-CM | POA: Diagnosis not present

## 2017-01-09 DIAGNOSIS — I4510 Unspecified right bundle-branch block: Secondary | ICD-10-CM

## 2017-01-09 DIAGNOSIS — I69959 Hemiplegia and hemiparesis following unspecified cerebrovascular disease affecting unspecified side: Secondary | ICD-10-CM | POA: Diagnosis not present

## 2017-01-09 DIAGNOSIS — L309 Dermatitis, unspecified: Secondary | ICD-10-CM | POA: Diagnosis not present

## 2017-01-09 DIAGNOSIS — E785 Hyperlipidemia, unspecified: Secondary | ICD-10-CM

## 2017-01-09 DIAGNOSIS — I6523 Occlusion and stenosis of bilateral carotid arteries: Secondary | ICD-10-CM

## 2017-01-09 LAB — CBC WITH DIFFERENTIAL/PLATELET
BASOS PCT: 1 %
Basophils Absolute: 68 cells/uL (ref 0–200)
Eosinophils Absolute: 68 cells/uL (ref 15–500)
Eosinophils Relative: 1 %
HEMATOCRIT: 44.1 % (ref 38.5–50.0)
Hemoglobin: 15.1 g/dL (ref 13.2–17.1)
LYMPHS ABS: 1224 {cells}/uL (ref 850–3900)
LYMPHS PCT: 18 %
MCH: 32.8 pg (ref 27.0–33.0)
MCHC: 34.2 g/dL (ref 32.0–36.0)
MCV: 95.9 fL (ref 80.0–100.0)
MPV: 9.7 fL (ref 7.5–12.5)
Monocytes Absolute: 680 cells/uL (ref 200–950)
Monocytes Relative: 10 %
Neutro Abs: 4760 cells/uL (ref 1500–7800)
Neutrophils Relative %: 70 %
Platelets: 235 10*3/uL (ref 140–400)
RBC: 4.6 MIL/uL (ref 4.20–5.80)
RDW: 14 % (ref 11.0–15.0)
WBC: 6.8 10*3/uL (ref 4.0–10.5)

## 2017-01-09 MED ORDER — CLOPIDOGREL BISULFATE 75 MG PO TABS: ORAL_TABLET | ORAL | 3 refills | Status: DC

## 2017-01-09 MED ORDER — AMLODIPINE BESYLATE 10 MG PO TABS: 10.0000 mg | ORAL_TABLET | Freq: Every day | ORAL | 3 refills | Status: DC

## 2017-01-09 MED ORDER — TIZANIDINE HCL 2 MG PO TABS: 2.0000 mg | ORAL_TABLET | Freq: Two times a day (BID) | ORAL | 3 refills | Status: DC

## 2017-01-09 MED ORDER — BACLOFEN 10 MG PO TABS: 10.0000 mg | ORAL_TABLET | Freq: Three times a day (TID) | ORAL | 3 refills | Status: DC

## 2017-01-09 MED ORDER — LISINOPRIL 10 MG PO TABS: 10.0000 mg | ORAL_TABLET | Freq: Every day | ORAL | 3 refills | Status: DC

## 2017-01-09 MED ORDER — SIMVASTATIN 20 MG PO TABS: 20.0000 mg | ORAL_TABLET | Freq: Every evening | ORAL | 3 refills | Status: DC

## 2017-01-09 NOTE — Progress Notes (Signed)
Richard Kent is a 83 y.o. male who presents for annual wellness visit and follow-up on chronic medical conditions.  He has the following concerns: he has a previous history of CVA and subsequent spastic hemiplegia. He continues to do well on the medications listed in the chart for this. His wife is his primary caregiver. He also has a history of carotid stenosis however this was evaluated in the past and the recommendation was no intervention was needed. He continues on his Zocor for his hyperlipidemia. He also is taking lisinopril for his hypertension. He continues on Plavix. In the past she had been given Botox to help with his spasticity however that is not being used at the present time. He is using baclofen as well as Zanaflex. He offers no other complaints.  Immunizations and Health Maintenance Immunization History  Administered Date(s) Administered  . Influenza Split 02/09/2012  . Influenza Whole 01/12/2011  . Influenza, High Dose Seasonal PF 03/18/2013, 03/17/2014, 03/04/2015, 01/28/2016, 01/09/2017  . PPD Test 04/17/1983  . Pneumococcal Conjugate-13 03/17/2014  . Pneumococcal-Unspecified 11/02/2008  . Tdap 11/02/2008   Health Maintenance Due  Topic Date Due  . INFLUENZA VACCINE  12/20/2016    Last colonoscopy:11/27/2008 Last PSA: N/ADentist:Dr.Bonick Ophtho:Dr.Sara Sabra Heck  Exercise:Yes 7 days   Other doctors caring for patient include:---  Advanced Directives: Yes. I copy ask for.    Depression screen:  See questionnaire below.     Depression screen [facility] 2/9 01/09/2017 09/06/2015 03/17/2014  Decreased Interest 0 0 0  Down, Depressed, Hopeless - 0 0  PHQ - 2 Score 0 0 0    Fall Screen: See Questionaire below.   Fall Risk  01/09/2017 09/06/2015 03/17/2014  Falls in the past year? Exclusion - non ambulatory Yes No  Number falls in past yr: - 1 -  Injury with Fall? - No -  Risk for fall due to : History of fall(s) Impaired mobility -  Risk for fall due to: Comment pt is  in a wheelchair  - -    ADL screen:  See questionnaire below.  Functional Status Survey: Is the patient deaf or have difficulty hearing?: Yes Does the patient have difficulty seeing, even when wearing glasses/contacts?: No Does the patient have difficulty concentrating, remembering, or making decisions?: No Does the patient have difficulty walking or climbing stairs?: Yes Does the patient have difficulty dressing or bathing?: Yes Does the patient have difficulty doing errands alone such as visiting a doctor's office or shopping?: Yes   Review of Systems  Constitutional: -, -unexpected weight change, -anorexia, -fatigue Allergy: -sneezing, -itching, -congestion Dermatology: denies changing moles, rash, lumps ENT: -runny nose, -ear pain, -sore throat,  Cardiology:  -chest pain, -palpitations, -orthopnea, Respiratory: -cough, -shortness of breath, -dyspnea on exertion, -wheezing,  Gastroenterology: -abdominal pain, -nausea, -vomiting, -diarrhea, -constipation, -dysphagia Hematology: -bleeding or bruising problems Musculoskeletal: -arthralgias, -myalgias, -joint swelling, -back pain, - Ophthalmology: -vision changes,  Urology: -dysuria, -difficulty urinating,  -urinary frequency, -urgency, incontinence Neurology: -, -numbness, , -memory loss, -falls, -dizziness    PHYSICAL EXAM:   General Appearance: Alert, cooperative, no distress, appears stated age Head: Normocephalic, without obvious abnormality, atraumatic Eyes: PERRL, conjunctiva/corneas clear, EOM's intact, fundi benign Ears: Normal TM's and external ear canals Nose: Nares normal, mucosa normal, no drainage or sinus   tenderness Throat: Lips, mucosa, and tongue normal; teeth and gums normal Neck: Supple, no lymphadenopathy, thyroid:no enlargement/tenderness/nodules; no carotid bruit or JVD Lungs: Clear to auscultation bilaterally without wheezes, rales or ronchi; respirations unlabored Heart: Regular rate and  rhythm, S1  and S2 normal, no murmur, rub or gallop Abdomen: Soft, non-tender, nondistended, normoactive bowel sounds, no masses, no hepatosplenomegaly Extremities: No clubbing, cyanosis or edema Pulses: 2+ and symmetric all extremities Skin: Erythematous maculopapular lesions are noted on his face. He also has purpuric lesions on his forearms. Lymph nodes: Cervical, supraclavicular, and axillary nodes normal Neurologic: CNII-XII intact, normal strength, sensation and gait; reflexes 2+ and symmetric throughout   Psych: Normal mood, affect, hygiene and grooming  ASSESSMENT/PLAN: Spastic hemiplegia of right dominant side as late effect of cerebrovascular disease, unspecified cerebrovascular disease type ([place])  Need for influenza vaccination - Plan: Flu vaccine HIGH DOSE PF (Fluzone High dose)  Carotid stenosis, bilateral - Plan: CBC with Differential/Platelet, Comprehensive metabolic panel, Lipid panel, clopidogrel (PLAVIX) 75 MG tablet  Hyperlipidemia LDL goal <100 - Plan: Lipid panel, simvastatin (ZOCOR) 20 MG tablet  Essential hypertension - Plan: CBC with Differential/Platelet, Comprehensive metabolic panel, lisinopril (PRINIVIL,ZESTRIL) 10 MG tablet, amLODipine (NORVASC) 10 MG tablet  RBBB - Plan: CBC with Differential/Platelet, Comprehensive metabolic panel, Lipid panel  Hemiplegia as late effect of cerebrovascular disease, unspecified cerebrovascular disease type, unspecified hemiplegia type, unspecified laterality (HCC) - Plan: tiZANidine (ZANAFLEX) 2 MG tablet, baclofen (LIORESAL) 10 MG tablet  Senile purpura (HCC)  Dermatitis - Plan: Ambulatory referral to Dermatology  His immunizations were updated. Also recommend he get the Shingrix vaccine. Refer to dermatology concerning the facial lesions. Continue present medication regimen.    Medicare Attestation I have personally reviewed: The patient's medical and social history Their use of alcohol, tobacco or illicit drugs Their current  medications and supplements The patient's functional ability including ADLs,fall risks, home safety risks, cognitive, and hearing and visual impairment Diet and physical activities Evidence for depression or mood disorders  The patient's weight, height, and BMI have been recorded in the chart.  I have made referrals, counseling, and provided education to the patient based on review of the above and I have provided the patient with a written personalized care plan for preventive services.     Wyatt Haste, MD   01/09/2017

## 2017-01-10 LAB — COMPREHENSIVE METABOLIC PANEL
ALT: 23 U/L (ref 9–46)
AST: 18 U/L (ref 10–35)
Albumin: 4 g/dL (ref 3.6–5.1)
Alkaline Phosphatase: 68 U/L (ref 40–115)
BILIRUBIN TOTAL: 0.4 mg/dL (ref 0.2–1.2)
BUN: 20 mg/dL (ref 7–25)
CO2: 21 mmol/L (ref 20–32)
CREATININE: 1.04 mg/dL (ref 0.70–1.11)
Calcium: 8.6 mg/dL (ref 8.6–10.3)
Chloride: 107 mmol/L (ref 98–110)
GLUCOSE: 112 mg/dL — AB (ref 65–99)
Potassium: 4.3 mmol/L (ref 3.5–5.3)
SODIUM: 139 mmol/L (ref 135–146)
Total Protein: 6.4 g/dL (ref 6.1–8.1)

## 2017-01-10 LAB — LIPID PANEL
Cholesterol: 109 mg/dL (ref <200)
HDL: 39 mg/dL — ABNORMAL LOW (ref >40)
LDL Cholesterol: 55 mg/dL (ref <100)
Total CHOL/HDL Ratio: 2.8 Ratio (ref <5.0)
Triglycerides: 75 mg/dL (ref <150)
VLDL: 15 mg/dL (ref <30)

## 2017-01-12 DIAGNOSIS — H353121 Nonexudative age-related macular degeneration, left eye, early dry stage: Secondary | ICD-10-CM | POA: Diagnosis not present

## 2017-01-12 DIAGNOSIS — H52223 Regular astigmatism, bilateral: Secondary | ICD-10-CM | POA: Diagnosis not present

## 2017-01-12 DIAGNOSIS — H25813 Combined forms of age-related cataract, bilateral: Secondary | ICD-10-CM | POA: Diagnosis not present

## 2017-01-12 DIAGNOSIS — H353111 Nonexudative age-related macular degeneration, right eye, early dry stage: Secondary | ICD-10-CM | POA: Diagnosis not present

## 2017-01-12 DIAGNOSIS — H5203 Hypermetropia, bilateral: Secondary | ICD-10-CM | POA: Diagnosis not present

## 2017-01-25 ENCOUNTER — Encounter: Payer: Self-pay | Admitting: Internal Medicine

## 2017-03-09 DIAGNOSIS — L718 Other rosacea: Secondary | ICD-10-CM | POA: Diagnosis not present

## 2017-06-08 DIAGNOSIS — L718 Other rosacea: Secondary | ICD-10-CM | POA: Diagnosis not present

## 2017-06-18 ENCOUNTER — Telehealth: Payer: Self-pay | Admitting: Family Medicine

## 2017-06-18 NOTE — Telephone Encounter (Signed)
Dr. Redmond School gave hand written rx for walkaide electrodes rx given to patient's wife, Richard Kent

## 2017-06-18 NOTE — Telephone Encounter (Signed)
He has a walkaide and it has electrodes that go on it and she tried to go to [employer] and buy them but needs rx  Walkaide premium electrodes  self*adhering neurostimulation electrodes  Comes in packs of # 4  She wants 2 packs

## 2018-02-22 ENCOUNTER — Other Ambulatory Visit: Payer: Self-pay | Admitting: Family Medicine

## 2018-02-22 DIAGNOSIS — I69959 Hemiplegia and hemiparesis following unspecified cerebrovascular disease affecting unspecified side: Secondary | ICD-10-CM

## 2018-02-22 NOTE — Telephone Encounter (Signed)
[facility] is requesting to fill pt tizanidine. Please advise [facility]

## 2018-02-28 ENCOUNTER — Other Ambulatory Visit (INDEPENDENT_AMBULATORY_CARE_PROVIDER_SITE_OTHER): Payer: Medicare Other

## 2018-02-28 DIAGNOSIS — Z23 Encounter for immunization: Secondary | ICD-10-CM

## 2018-03-01 ENCOUNTER — Other Ambulatory Visit: Payer: Self-pay | Admitting: Family Medicine

## 2018-03-01 DIAGNOSIS — I10 Essential (primary) hypertension: Secondary | ICD-10-CM

## 2018-03-20 ENCOUNTER — Other Ambulatory Visit: Payer: Self-pay | Admitting: Family Medicine

## 2018-03-20 DIAGNOSIS — I6523 Occlusion and stenosis of bilateral carotid arteries: Secondary | ICD-10-CM

## 2018-03-20 DIAGNOSIS — I10 Essential (primary) hypertension: Secondary | ICD-10-CM

## 2018-04-01 ENCOUNTER — Other Ambulatory Visit: Payer: Self-pay | Admitting: Family Medicine

## 2018-04-01 DIAGNOSIS — E785 Hyperlipidemia, unspecified: Secondary | ICD-10-CM

## 2018-04-15 ENCOUNTER — Other Ambulatory Visit: Payer: Self-pay | Admitting: Family Medicine

## 2018-04-15 DIAGNOSIS — I69959 Hemiplegia and hemiparesis following unspecified cerebrovascular disease affecting unspecified side: Secondary | ICD-10-CM

## 2018-04-15 NOTE — Telephone Encounter (Signed)
[facility] IS REQUESTING TO FILL PT BACLOFEN. kh

## 2018-05-31 ENCOUNTER — Encounter: Payer: Self-pay | Admitting: Family Medicine

## 2018-05-31 ENCOUNTER — Ambulatory Visit (INDEPENDENT_AMBULATORY_CARE_PROVIDER_SITE_OTHER): Payer: Medicare Other | Admitting: Family Medicine

## 2018-05-31 VITALS — BP 110/72 | HR 64 | Temp 97.6°F | Wt 183.4 lb

## 2018-05-31 DIAGNOSIS — I6523 Occlusion and stenosis of bilateral carotid arteries: Secondary | ICD-10-CM | POA: Diagnosis not present

## 2018-05-31 DIAGNOSIS — E785 Hyperlipidemia, unspecified: Secondary | ICD-10-CM

## 2018-05-31 DIAGNOSIS — D692 Other nonthrombocytopenic purpura: Secondary | ICD-10-CM

## 2018-05-31 DIAGNOSIS — I69959 Hemiplegia and hemiparesis following unspecified cerebrovascular disease affecting unspecified side: Secondary | ICD-10-CM

## 2018-05-31 DIAGNOSIS — I4510 Unspecified right bundle-branch block: Secondary | ICD-10-CM

## 2018-05-31 DIAGNOSIS — Z0000 Encounter for general adult medical examination without abnormal findings: Secondary | ICD-10-CM

## 2018-05-31 DIAGNOSIS — I10 Essential (primary) hypertension: Secondary | ICD-10-CM

## 2018-05-31 MED ORDER — AMLODIPINE BESYLATE 10 MG PO TABS: 10.0000 mg | ORAL_TABLET | Freq: Every day | ORAL | 3 refills | Status: DC

## 2018-05-31 MED ORDER — TIZANIDINE HCL 2 MG PO TABS: 2.0000 mg | ORAL_TABLET | Freq: Two times a day (BID) | ORAL | 3 refills | Status: DC

## 2018-05-31 MED ORDER — BACLOFEN 10 MG PO TABS: 10.0000 mg | ORAL_TABLET | Freq: Three times a day (TID) | ORAL | 3 refills | Status: DC

## 2018-05-31 MED ORDER — LISINOPRIL 10 MG PO TABS: 10.0000 mg | ORAL_TABLET | Freq: Every day | ORAL | 3 refills | Status: DC

## 2018-05-31 MED ORDER — CLOPIDOGREL BISULFATE 75 MG PO TABS: ORAL_TABLET | ORAL | 3 refills | Status: DC

## 2018-05-31 MED ORDER — SIMVASTATIN 20 MG PO TABS: 20.0000 mg | ORAL_TABLET | Freq: Every evening | ORAL | 3 refills | Status: DC

## 2018-05-31 NOTE — Addendum Note (Signed)
Addended by: Denita Lung on: 05/31/2018 01:36 PM   Modules accepted: Orders

## 2018-05-31 NOTE — Patient Instructions (Signed)
  Richard Kent , Thank you for taking time to come for your Medicare Wellness Visit. I appreciate your ongoing commitment to your health goals. Please review the following plan we discussed and let me know if I can assist you in the future.   These are the goals we discussed: Goals   None     This is a list of the screening recommended for you and due dates:  Health Maintenance  Topic Date Due  . Tetanus Vaccine  11/03/2018  . Flu Shot  Completed  . Pneumonia vaccines  Completed

## 2018-05-31 NOTE — Progress Notes (Addendum)
Richard Kent is a 85 y.o. male who presents for annual wellness visit,CPE and follow-up on chronic medical conditions.  He has a history of hemiplegia secondary to CVA.  He is using Zanaflex.  Does have an underlying history of hypertension and is using lisinopril and amlodipine.  Also has a history of carotid stenosis and and is using Plavix.  He also is using baclofen.  In the past he had used Botox but apparently his insurance is not now covering it.  His wife is his main caregiver.  Immunizations and Health Maintenance Immunization History  Administered Date(s) Administered  . Influenza Split 02/09/2012  . Influenza Whole 01/12/2011  . Influenza, High Dose Seasonal PF 03/18/2013, 03/17/2014, 03/04/2015, 01/28/2016, 01/09/2017, 02/28/2018  . PPD Test 04/17/1983  . Pneumococcal Conjugate-13 03/17/2014  . Pneumococcal-Unspecified 11/02/2008  . Tdap 11/02/2008   There are no preventive care reminders to display for this patient.  Last colonoscopy:N/A Last PSA:N/A Dentist: [place]  Other doctors caring for patient include:none  Advanced Directives:NO info Given Does Patient Have a Medical Advance Directive?: No Would patient like information on creating a medical advance directive?: No - Patient declined  Depression screen:  See questionnaire below.     Depression screen [facility] 2/9 05/31/2018 03/20/2018 01/09/2017 09/06/2015 03/17/2014  Decreased Interest 0 0 0 0 0  Down, Depressed, Hopeless - 0 - 0 0  PHQ - 2 Score 0 0 0 0 0    Fall Screen: See Questionaire below.   Fall Risk  05/31/2018 03/20/2018 01/09/2017 09/06/2015 03/17/2014  Falls in the past year? 0 No Exclusion - non ambulatory Yes No  Number falls in past yr: - - - 1 -  Injury with Fall? - - - No -  Risk for fall due to : - - History of fall(s) Impaired mobility -  Risk for fall due to: Comment - - pt is in a wheelchair  - -    ADL screen:  See questionnaire below.  Functional Status  Survey: Is the patient deaf or have difficulty hearing?: No Does the patient have difficulty seeing, even when wearing glasses/contacts?: No Does the patient have difficulty concentrating, remembering, or making decisions?: No Does the patient have difficulty walking or climbing stairs?: No Does the patient have difficulty dressing or bathing?: No Does the patient have difficulty doing errands alone such as visiting a doctor's office or shopping?: Yes(with help fine)   Review of Systems  Constitutional: -, -unexpected weight change, -anorexia, -fatigue Allergy: -sneezing, -itching, -congestion Dermatology: denies changing moles, rash, lumps ENT: -runny nose, -ear pain, -sore throat,  Cardiology:  -chest pain, -palpitations, -orthopnea, Respiratory: -cough, -shortness of breath, -dyspnea on exertion, -wheezing,  Gastroenterology: -abdominal pain, -nausea, -vomiting, -diarrhea, -constipation, -dysphagia Hematology: -bleeding or bruising problems Musculoskeletal: -arthralgias, -myalgias, -joint swelling, -back pain, - Ophthalmology: -vision changes,  Urology: -dysuria, -difficulty urinating,  -urinary frequency, -urgency, incontinence Neurology: -, -numbness, , -memory loss, -falls, -dizziness    PHYSICAL EXAM:   General Appearance: Alert, cooperative, no distress, appears stated age Head: Normocephalic, without obvious abnormality, atraumatic Eyes: PERRL, conjunctiva/corneas clear, EOM's intact, fundi benign Ears: Normal TM's and external ear canals Nose: Nares normal, mucosa normal, no drainage or sinus   tenderness Throat: Lips, mucosa, and tongue normal; teeth and gums normal Neck: Supple, no lymphadenopathy, thyroid:no enlargement/tenderness/nodules; no carotid bruit or JVD Lungs: Clear to auscultation bilaterally without wheezes, rales or ronchi; respirations unlabored Heart: Slightly irregular rate and rhythm, S1 and S2 normal, no murmur, rub or gallop Abdomen:  Soft,  non-tender, nondistended, normoactive bowel sounds, no masses, no hepatosplenomegaly Extremities: No clubbing, cyanosis or edema Pulses: 2+ and symmetric all extremities Skin: Skin color, texture, turgor normal, purpuric lesions noted on both forearms. Lymph nodes: Cervical, supraclavicular, and axillary nodes normal Neurologic: CNII-XII intact, normal strength, sensation and gait; reflexes 2+ and symmetric throughout   Psych: Normal mood, affect, hygiene and grooming EKG does show right bundle as well as first-degree block and PACs. ASSESSMENT/PLAN: Routine general medical examination at a health care facility - Plan: EKG 12-Lead, CBC with Differential/Platelet, Comprehensive metabolic panel, Lipid panel  Carotid stenosis, bilateral - Plan: clopidogrel (PLAVIX) 75 MG tablet, Lipid panel  Hemiplegia as late effect of cerebrovascular disease, unspecified cerebrovascular disease type, unspecified hemiplegia type, unspecified laterality (HCC) - Plan: tiZANidine (ZANAFLEX) 2 MG tablet, baclofen (LIORESAL) 10 MG tablet  Hyperlipidemia LDL goal <100 - Plan: simvastatin (ZOCOR) 20 MG tablet, Lipid panel  Essential hypertension - Plan: lisinopril (PRINIVIL,ZESTRIL) 10 MG tablet, amLODipine (NORVASC) 10 MG tablet, CBC with Differential/Platelet, Comprehensive metabolic panel  RBBB - Plan: EKG 12-Lead, CBC with Differential/Platelet, Comprehensive metabolic panel, Lipid panel  Senile purpura (HCC) - Plan: CBC with Differential/Platelet, Comprehensive metabolic panel He is stable with his current medication regimen.  Discussed getting the shingles vaccine and also Tdap.  Otherwise he will continue on his present medication regimen.   Medicare Attestation I have personally reviewed: The patient's medical and social history Their use of alcohol, tobacco or illicit drugs Their current medications and supplements The patient's functional ability including ADLs,fall risks, home safety risks, cognitive,  and hearing and visual impairment Diet and physical activities Evidence for depression or mood disorders  The patient's weight, height, and BMI have been recorded in the chart.  I have made referrals, counseling, and provided education to the patient based on review of the above and I have provided the patient with a written personalized care plan for preventive services.     Jill Alexanders, MD   05/31/2018    Richard Kent is a 85 y.o. male who presents for annual wellness visit and follow-up on chronic medical conditions.  He has the following concerns:   Immunizations and Health Maintenance Immunization History  Administered Date(s) Administered  . Influenza Split 02/09/2012  . Influenza Whole 01/12/2011  . Influenza, High Dose Seasonal PF 03/18/2013, 03/17/2014, 03/04/2015, 01/28/2016, 01/09/2017, 02/28/2018  . PPD Test 04/17/1983  . Pneumococcal Conjugate-13 03/17/2014  . Pneumococcal-Unspecified 11/02/2008  . Tdap 11/02/2008   There are no preventive care reminders to display for this patient.  Last colonoscopy:aged out Last TDV:VOHY five years Dentist:2019 Ophtho: two years ago Exercise: stretching and walking  Other doctors caring for patient include:  Advanced Directives: Does Patient Have a Medical Advance Directive?: No Would patient like information on creating a medical advance directive?: No - Patient declined  Depression screen:  See questionnaire below.     Depression screen [facility] 2/9 05/31/2018 03/20/2018 01/09/2017 09/06/2015 03/17/2014  Decreased Interest 0 0 0 0 0  Down, Depressed, Hopeless - 0 - 0 0  PHQ - 2 Score 0 0 0 0 0    Fall Screen: See Questionaire below.   Fall Risk  05/31/2018 03/20/2018 01/09/2017 09/06/2015 03/17/2014  Falls in the past year? 0 No Exclusion - non ambulatory Yes No  Number falls in past yr: - - - 1 -  Injury with Fall? - - - No -  Risk for fall due to : - - History of fall(s) Impaired mobility -  Risk for  fall due to: Comment  - - pt is in a wheelchair  - -    ADL screen:  See questionnaire below.  Functional Status Survey: Is the patient deaf or have difficulty hearing?: No Does the patient have difficulty seeing, even when wearing glasses/contacts?: No Does the patient have difficulty concentrating, remembering, or making decisions?: No Does the patient have difficulty walking or climbing stairs?: No Does the patient have difficulty dressing or bathing?: No Does the patient have difficulty doing errands alone such as visiting a doctor's office or shopping?: Yes(with help fine)   Review of Systems  Constitutional: -, -unexpected weight change, -anorexia, -fatigue Allergy: -sneezing, -itching, -congestion Dermatology: denies changing moles, rash, lumps ENT: -runny nose, -ear pain, -sore throat,  Cardiology:  -chest pain, -palpitations, -orthopnea, Respiratory: -cough, -shortness of breath, -dyspnea on exertion, -wheezing,  Gastroenterology: -abdominal pain, -nausea, -vomiting, -diarrhea, -constipation, -dysphagia Hematology: -bleeding or bruising problems Musculoskeletal: -arthralgias, -myalgias, -joint swelling, -back pain, - Ophthalmology: -vision changes,  Urology: -dysuria, -difficulty urinating,  -urinary frequency, -urgency, incontinence Neurology: -, -numbness, , -memory loss, -falls, -dizziness    PHYSICAL EXAM:  BP 110/72 (BP Location: Left Arm, Patient Position: Sitting)   Pulse 64   Temp 97.6 F (36.4 C)   Wt 183 lb 6.4 oz (83.2 kg)   SpO2 94%   BMI 24.87 kg/m   General Appearance: Alert, cooperative, no distress, appears stated age Head: Normocephalic, without obvious abnormality, atraumatic Eyes: PERRL, conjunctiva/corneas clear, EOM's intact, fundi benign Ears: Normal TM's and external ear canals Nose: Nares normal, mucosa normal, no drainage or sinus   tenderness Throat: Lips, mucosa, and tongue normal; teeth and gums normal Neck: Supple, no lymphadenopathy, thyroid:no  enlargement/tenderness/nodules; no carotid bruit or JVD Lungs: Clear to auscultation bilaterally without wheezes, rales or ronchi; respirations unlabored Heart: Regular rate and rhythm, S1 and S2 normal, no murmur, rub or gallop Abdomen: Soft, non-tender, nondistended, normoactive bowel sounds, no masses, no hepatosplenomegaly Extremities: No clubbing, cyanosis or edema Pulses: 2+ and symmetric all extremities Skin: Skin color, texture, turgor normal, no rashes or lesions Lymph nodes: Cervical, supraclavicular, and axillary nodes normal Neurologic: CNII-XII intact, normal strength, sensation and gait; reflexes 2+ and symmetric throughout   Psych: Normal mood, affect, hygiene and grooming  ASSESSMENT/PLAN:    Discussed PSA screening (risks/benefits), recommended at least 30 minutes of aerobic activity at least 5 days/week; proper sunscreen use reviewed; healthy diet and alcohol recommendations (less than or equal to 2 drinks/day) reviewed; regular seatbelt use; changing batteries in smoke detectors. Immunization recommendations discussed.  Colonoscopy recommendations reviewed.   Medicare Attestation I have personally reviewed: The patient's medical and social history Their use of alcohol, tobacco or illicit drugs Their current medications and supplements The patient's functional ability including ADLs,fall risks, home safety risks, cognitive, and hearing and visual impairment Diet and physical activities Evidence for depression or mood disorders  The patient's weight, height, and BMI have been recorded in the chart.  I have made referrals, counseling, and provided education to the patient based on review of the above and I have provided the patient with a written personalized care plan for preventive services.     Jill Alexanders, MD   05/31/2018

## 2018-06-01 LAB — COMPREHENSIVE METABOLIC PANEL
ALT: 26 IU/L (ref 0–44)
AST: 18 IU/L (ref 0–40)
Albumin/Globulin Ratio: 1.6 (ref 1.2–2.2)
Albumin: 4.4 g/dL (ref 3.5–4.7)
Alkaline Phosphatase: 73 IU/L (ref 39–117)
BUN/Creatinine Ratio: 15 (ref 10–24)
BUN: 16 mg/dL (ref 8–27)
Bilirubin Total: 0.5 mg/dL (ref 0.0–1.2)
CO2: 24 mmol/L (ref 20–29)
Calcium: 9 mg/dL (ref 8.6–10.2)
Chloride: 102 mmol/L (ref 96–106)
Creatinine, Ser: 1.06 mg/dL (ref 0.76–1.27)
GFR calc Af Amer: 73 mL/min/{1.73_m2} (ref >59)
GFR calc non Af Amer: 63 mL/min/{1.73_m2} (ref >59)
GLOBULIN, TOTAL: 2.7 g/dL (ref 1.5–4.5)
Glucose: 104 mg/dL — ABNORMAL HIGH (ref 65–99)
Potassium: 4.2 mmol/L (ref 3.5–5.2)
SODIUM: 142 mmol/L (ref 134–144)
Total Protein: 7.1 g/dL (ref 6.0–8.5)

## 2018-06-01 LAB — CBC WITH DIFFERENTIAL/PLATELET
Basophils Absolute: 0.1 10*3/uL (ref 0.0–0.2)
Basos: 1 %
EOS (ABSOLUTE): 0.1 10*3/uL (ref 0.0–0.4)
EOS: 1 %
Hematocrit: 48.4 % (ref 37.5–51.0)
Hemoglobin: 16.7 g/dL (ref 13.0–17.7)
Immature Grans (Abs): 0 10*3/uL (ref 0.0–0.1)
Immature Granulocytes: 0 %
Lymphocytes Absolute: 1.3 10*3/uL (ref 0.7–3.1)
Lymphs: 15 %
MCH: 33.1 pg — AB (ref 26.6–33.0)
MCHC: 34.5 g/dL (ref 31.5–35.7)
MCV: 96 fL (ref 79–97)
Monocytes Absolute: 0.7 10*3/uL (ref 0.1–0.9)
Monocytes: 8 %
Neutrophils Absolute: 6.7 10*3/uL (ref 1.4–7.0)
Neutrophils: 75 %
Platelets: 261 10*3/uL (ref 150–450)
RBC: 5.04 x10E6/uL (ref 4.14–5.80)
RDW: 13.1 % (ref 11.6–15.4)
WBC: 9 10*3/uL (ref 3.4–10.8)

## 2018-06-01 LAB — LIPID PANEL
Chol/HDL Ratio: 2.8 ratio (ref 0.0–5.0)
Cholesterol, Total: 132 mg/dL (ref 100–199)
HDL: 47 mg/dL (ref >39)
LDL Calculated: 64 mg/dL (ref 0–99)
Triglycerides: 104 mg/dL (ref 0–149)
VLDL Cholesterol Cal: 21 mg/dL (ref 5–40)

## 2019-03-24 ENCOUNTER — Other Ambulatory Visit: Payer: Self-pay

## 2019-03-24 ENCOUNTER — Other Ambulatory Visit (INDEPENDENT_AMBULATORY_CARE_PROVIDER_SITE_OTHER): Payer: Medicare Other

## 2019-03-24 DIAGNOSIS — Z23 Encounter for immunization: Secondary | ICD-10-CM | POA: Diagnosis not present

## 2019-06-03 ENCOUNTER — Ambulatory Visit (INDEPENDENT_AMBULATORY_CARE_PROVIDER_SITE_OTHER): Payer: Medicare Other | Admitting: Family Medicine

## 2019-06-03 ENCOUNTER — Encounter: Payer: Self-pay | Admitting: Family Medicine

## 2019-06-03 ENCOUNTER — Other Ambulatory Visit: Payer: Self-pay

## 2019-06-03 ENCOUNTER — Ambulatory Visit: Payer: Medicare Other | Admitting: Family Medicine

## 2019-06-03 VITALS — BP 122/70 | HR 72 | Temp 97.1°F

## 2019-06-03 DIAGNOSIS — I10 Essential (primary) hypertension: Secondary | ICD-10-CM | POA: Diagnosis not present

## 2019-06-03 DIAGNOSIS — I69959 Hemiplegia and hemiparesis following unspecified cerebrovascular disease affecting unspecified side: Secondary | ICD-10-CM | POA: Diagnosis not present

## 2019-06-03 DIAGNOSIS — E785 Hyperlipidemia, unspecified: Secondary | ICD-10-CM

## 2019-06-03 NOTE — Progress Notes (Signed)
   Subjective:    Patient ID: Richard Kent, male    DOB: March 04, 1929, 86 y.o.   MRN: [id number]  HPI He is here for a follow-up visit.  His wife is his primary caregiver.  He has hemiplegia secondary to a CVA.  He is taking baclofen to help with that.  He also continues on Plavix, lisinopril, simvastatin.  He is having no difficulty with those medications.  He has no particular concerns or complaints and his wife verbalizes no problems.   Review of Systems     Objective:   Physical Exam Alert and in no distress. Tympanic membranes and canals are normal. Pharyngeal area is normal. Neck is supple without adenopathy or thyromegaly. Cardiac exam shows a regular sinus rhythm without murmurs or gallops. Lungs are clear to auscultation.        Assessment & Plan:  Hemiplegia as late effect of cerebrovascular disease, unspecified cerebrovascular disease type, unspecified hemiplegia type, unspecified laterality ([place])  Hyperlipidemia LDL goal <100  Essential hypertension Review of his meds indicates that he needs none renewed at this time.  They will keep in touch with [place].  Also discussed Covid testing.

## 2019-06-03 NOTE — Addendum Note (Signed)
Addended by: Denita Lung on: 06/03/2019 01:33 PM   Modules accepted: Orders

## 2019-06-04 LAB — COMPREHENSIVE METABOLIC PANEL
ALT: 24 IU/L (ref 0–44)
AST: 17 IU/L (ref 0–40)
Albumin/Globulin Ratio: 1.9 (ref 1.2–2.2)
Albumin: 4.5 g/dL (ref 3.6–4.6)
Alkaline Phosphatase: 63 IU/L (ref 39–117)
BUN/Creatinine Ratio: 17 (ref 10–24)
BUN: 18 mg/dL (ref 8–27)
Bilirubin Total: 0.6 mg/dL (ref 0.0–1.2)
CO2: 23 mmol/L (ref 20–29)
Calcium: 9.3 mg/dL (ref 8.6–10.2)
Chloride: 101 mmol/L (ref 96–106)
Creatinine, Ser: 1.03 mg/dL (ref 0.76–1.27)
GFR calc Af Amer: 75 mL/min/{1.73_m2} (ref >59)
GFR calc non Af Amer: 65 mL/min/{1.73_m2} (ref >59)
Globulin, Total: 2.4 g/dL (ref 1.5–4.5)
Glucose: 106 mg/dL — ABNORMAL HIGH (ref 65–99)
Potassium: 4.4 mmol/L (ref 3.5–5.2)
Sodium: 139 mmol/L (ref 134–144)
Total Protein: 6.9 g/dL (ref 6.0–8.5)

## 2019-06-04 LAB — CBC WITH DIFFERENTIAL/PLATELET
Basophils Absolute: 0.1 10*3/uL (ref 0.0–0.2)
Basos: 1 %
EOS (ABSOLUTE): 0.2 10*3/uL (ref 0.0–0.4)
Eos: 2 %
Hematocrit: 46.2 % (ref 37.5–51.0)
Hemoglobin: 16.2 g/dL (ref 13.0–17.7)
Immature Grans (Abs): 0 10*3/uL (ref 0.0–0.1)
Immature Granulocytes: 0 %
Lymphocytes Absolute: 1.1 10*3/uL (ref 0.7–3.1)
Lymphs: 12 %
MCH: 33.5 pg — ABNORMAL HIGH (ref 26.6–33.0)
MCHC: 35.1 g/dL (ref 31.5–35.7)
MCV: 96 fL (ref 79–97)
Monocytes Absolute: 0.7 10*3/uL (ref 0.1–0.9)
Monocytes: 7 %
Neutrophils Absolute: 7.3 10*3/uL — ABNORMAL HIGH (ref 1.4–7.0)
Neutrophils: 78 %
Platelets: 261 10*3/uL (ref 150–450)
RBC: 4.84 x10E6/uL (ref 4.14–5.80)
RDW: 13.1 % (ref 11.6–15.4)
WBC: 9.3 10*3/uL (ref 3.4–10.8)

## 2019-06-04 LAB — LIPID PANEL
Chol/HDL Ratio: 3 ratio (ref 0.0–5.0)
Cholesterol, Total: 134 mg/dL (ref 100–199)
HDL: 44 mg/dL (ref >39)
LDL Chol Calc (NIH): 69 mg/dL (ref 0–99)
Triglycerides: 113 mg/dL (ref 0–149)
VLDL Cholesterol Cal: 21 mg/dL (ref 5–40)

## 2019-06-18 ENCOUNTER — Other Ambulatory Visit: Payer: Self-pay | Admitting: Family Medicine

## 2019-06-18 DIAGNOSIS — E785 Hyperlipidemia, unspecified: Secondary | ICD-10-CM

## 2019-07-06 ENCOUNTER — Ambulatory Visit: Payer: Managed Care, Other (non HMO)

## 2019-07-20 ENCOUNTER — Ambulatory Visit: Payer: Medicare Other | Attending: Internal Medicine

## 2019-07-20 DIAGNOSIS — Z23 Encounter for immunization: Secondary | ICD-10-CM

## 2019-07-20 NOTE — Progress Notes (Signed)
   Covid-19 Vaccination Clinic  Name:  Richard Kent    MRN: [id number] DOB: [birth year]  07/20/2019  Mr. Honey was observed post Covid-19 immunization for 15 minutes without incidence. He was provided with Vaccine Information Sheet and instruction to access the V-Safe system.   [name] was instructed to call 911 with any severe reactions post vaccine: Marland Kitchen Difficulty breathing  . Swelling of your face and throat  . A fast heartbeat  . A bad rash all over your body  . Dizziness and weakness    Immunizations Administered    Name Date Dose VIS Date Route   Pfizer COVID-19 Vaccine 07/20/2019 11:19 AM 0.3 mL 05/02/2019 Intramuscular   Manufacturer: [place]   Lot: [id number]   [place]: [id number]

## 2019-08-13 ENCOUNTER — Ambulatory Visit: Payer: Medicare Other | Attending: Internal Medicine

## 2019-08-13 DIAGNOSIS — Z23 Encounter for immunization: Secondary | ICD-10-CM

## 2019-08-13 NOTE — Progress Notes (Signed)
   Covid-19 Vaccination Clinic  Name:  Richard Kent    MRN: [id number] DOB: Apr 11, 1929  08/13/2019  [name] was observed post Covid-19 immunization for 15 minutes without incident. He was provided with Vaccine Information Sheet and instruction to access the V-Safe system.   [name] was instructed to call 911 with any severe reactions post vaccine: Marland Kitchen Difficulty breathing  . Swelling of face and throat  . A fast heartbeat  . A bad rash all over body  . Dizziness and weakness   Immunizations Administered    Name Date Dose VIS Date Route   Pfizer COVID-19 Vaccine 08/13/2019  3:02 PM 0.3 mL 05/02/2019 Intramuscular   Manufacturer: [place]   Lot: [id number]   [place]: [id number]

## 2019-08-22 ENCOUNTER — Other Ambulatory Visit: Payer: Self-pay | Admitting: Family Medicine

## 2019-08-22 DIAGNOSIS — I10 Essential (primary) hypertension: Secondary | ICD-10-CM

## 2019-08-22 DIAGNOSIS — I69959 Hemiplegia and hemiparesis following unspecified cerebrovascular disease affecting unspecified side: Secondary | ICD-10-CM

## 2019-09-12 ENCOUNTER — Other Ambulatory Visit: Payer: Self-pay | Admitting: Family Medicine

## 2019-09-12 DIAGNOSIS — E785 Hyperlipidemia, unspecified: Secondary | ICD-10-CM

## 2019-09-12 DIAGNOSIS — I6523 Occlusion and stenosis of bilateral carotid arteries: Secondary | ICD-10-CM

## 2019-09-12 DIAGNOSIS — I10 Essential (primary) hypertension: Secondary | ICD-10-CM

## 2019-09-12 NOTE — Telephone Encounter (Signed)
Is it okay to refill plavix

## 2019-09-25 ENCOUNTER — Telehealth: Payer: Self-pay | Admitting: Medical

## 2019-09-25 NOTE — Telephone Encounter (Signed)
Information was relayed to pt's wife.

## 2019-09-25 NOTE — Telephone Encounter (Signed)
Richard Kent called me about patient having hiccups and diarrhea x 1 days. Advised clear fluids, don't take aanythingfor diarrhea yet.  Try these below for hiccups.   He has baclofen mentioned in chart.  If not taking this, can use this the next few days.    How do I treat hiccups? Drinking water quickly. Swallowing granulated sugar, dry pieces of bread, or crushed ice. Gently pulling on your tongue. Gagging (sticking a finger down your throat). Gently rubbing your eyeballs. Gargling water. Holding your breath. Breathing into a paper bag (do not use a plastic bag).   Make appt tomorrow if needed

## 2019-09-26 ENCOUNTER — Encounter (HOSPITAL_COMMUNITY): Payer: Self-pay | Admitting: Emergency Medicine

## 2019-09-26 ENCOUNTER — Emergency Department (HOSPITAL_COMMUNITY): Payer: Medicare Other

## 2019-09-26 ENCOUNTER — Inpatient Hospital Stay (HOSPITAL_COMMUNITY)
Admission: EM | Admit: 2019-09-26 | Discharge: 2019-09-28 | DRG: 392 | Disposition: A | Discharge status: Home Health | Payer: Medicare Other | Attending: Internal Medicine | Admitting: Internal Medicine

## 2019-09-26 DIAGNOSIS — R1110 Vomiting, unspecified: Secondary | ICD-10-CM | POA: Diagnosis not present

## 2019-09-26 DIAGNOSIS — Z7982 Long term (current) use of aspirin: Secondary | ICD-10-CM

## 2019-09-26 DIAGNOSIS — I4510 Unspecified right bundle-branch block: Secondary | ICD-10-CM | POA: Diagnosis present

## 2019-09-26 DIAGNOSIS — Z20822 Contact with and (suspected) exposure to covid-19: Secondary | ICD-10-CM | POA: Diagnosis not present

## 2019-09-26 DIAGNOSIS — I69959 Hemiplegia and hemiparesis following unspecified cerebrovascular disease affecting unspecified side: Secondary | ICD-10-CM

## 2019-09-26 DIAGNOSIS — R300 Dysuria: Secondary | ICD-10-CM | POA: Diagnosis not present

## 2019-09-26 DIAGNOSIS — A084 Viral intestinal infection, unspecified: Principal | ICD-10-CM | POA: Diagnosis present

## 2019-09-26 DIAGNOSIS — N289 Disorder of kidney and ureter, unspecified: Secondary | ICD-10-CM | POA: Diagnosis present

## 2019-09-26 DIAGNOSIS — E871 Hypo-osmolality and hyponatremia: Secondary | ICD-10-CM | POA: Diagnosis present

## 2019-09-26 DIAGNOSIS — R1084 Generalized abdominal pain: Secondary | ICD-10-CM | POA: Diagnosis not present

## 2019-09-26 DIAGNOSIS — R918 Other nonspecific abnormal finding of lung field: Secondary | ICD-10-CM | POA: Diagnosis not present

## 2019-09-26 DIAGNOSIS — I10 Essential (primary) hypertension: Secondary | ICD-10-CM | POA: Diagnosis present

## 2019-09-26 DIAGNOSIS — R000 Tachycardia, unspecified: Secondary | ICD-10-CM | POA: Diagnosis not present

## 2019-09-26 DIAGNOSIS — R0902 Hypoxemia: Secondary | ICD-10-CM | POA: Diagnosis not present

## 2019-09-26 DIAGNOSIS — J9811 Atelectasis: Secondary | ICD-10-CM | POA: Diagnosis not present

## 2019-09-26 DIAGNOSIS — Z8249 Family history of ischemic heart disease and other diseases of the circulatory system: Secondary | ICD-10-CM

## 2019-09-26 DIAGNOSIS — R112 Nausea with vomiting, unspecified: Secondary | ICD-10-CM

## 2019-09-26 DIAGNOSIS — I4891 Unspecified atrial fibrillation: Secondary | ICD-10-CM | POA: Diagnosis not present

## 2019-09-26 DIAGNOSIS — Z79899 Other long term (current) drug therapy: Secondary | ICD-10-CM

## 2019-09-26 DIAGNOSIS — Z96641 Presence of right artificial hip joint: Secondary | ICD-10-CM | POA: Diagnosis present

## 2019-09-26 DIAGNOSIS — R197 Diarrhea, unspecified: Secondary | ICD-10-CM | POA: Diagnosis present

## 2019-09-26 DIAGNOSIS — E861 Hypovolemia: Secondary | ICD-10-CM | POA: Diagnosis present

## 2019-09-26 DIAGNOSIS — Z87891 Personal history of nicotine dependence: Secondary | ICD-10-CM

## 2019-09-26 DIAGNOSIS — Z823 Family history of stroke: Secondary | ICD-10-CM

## 2019-09-26 DIAGNOSIS — Z7902 Long term (current) use of antithrombotics/antiplatelets: Secondary | ICD-10-CM

## 2019-09-26 DIAGNOSIS — R52 Pain, unspecified: Secondary | ICD-10-CM | POA: Diagnosis not present

## 2019-09-26 DIAGNOSIS — Z87442 Personal history of urinary calculi: Secondary | ICD-10-CM

## 2019-09-26 LAB — COMPREHENSIVE METABOLIC PANEL
ALT: 21 U/L (ref 0–44)
AST: 26 U/L (ref 15–41)
Albumin: 3.6 g/dL (ref 3.5–5.0)
Alkaline Phosphatase: 44 U/L (ref 38–126)
Anion gap: 15 (ref 5–15)
BUN: 33 mg/dL — ABNORMAL HIGH (ref 8–23)
CO2: 21 mmol/L — ABNORMAL LOW (ref 22–32)
Calcium: 8.1 mg/dL — ABNORMAL LOW (ref 8.9–10.3)
Chloride: 97 mmol/L — ABNORMAL LOW (ref 98–111)
Creatinine, Ser: 1.15 mg/dL (ref 0.61–1.24)
GFR calc Af Amer: >60 mL/min (ref >60)
GFR calc non Af Amer: 57 mL/min — ABNORMAL LOW (ref >60)
Glucose, Bld: 155 mg/dL — ABNORMAL HIGH (ref 70–99)
Potassium: 4 mmol/L (ref 3.5–5.1)
Sodium: 133 mmol/L — ABNORMAL LOW (ref 135–145)
Total Bilirubin: 0.9 mg/dL (ref 0.3–1.2)
Total Protein: 6.6 g/dL (ref 6.5–8.1)

## 2019-09-26 LAB — CBC
HCT: 44.8 % (ref 39.0–52.0)
Hemoglobin: 15.1 g/dL (ref 13.0–17.0)
MCH: 32.9 pg (ref 26.0–34.0)
MCHC: 33.7 g/dL (ref 30.0–36.0)
MCV: 97.6 fL (ref 80.0–100.0)
Platelets: 254 10*3/uL (ref 150–400)
RBC: 4.59 MIL/uL (ref 4.22–5.81)
RDW: 13.1 % (ref 11.5–15.5)
WBC: 13.8 10*3/uL — ABNORMAL HIGH (ref 4.0–10.5)
nRBC: 0 % (ref 0.0–0.2)

## 2019-09-26 LAB — LIPASE, BLOOD: Lipase: 29 U/L (ref 11–51)

## 2019-09-26 MED ORDER — SODIUM CHLORIDE 0.9 % IV SOLN
500.0000 mg | Freq: Once | INTRAVENOUS | Status: AC
  Administered 2019-09-27: 500 mg via INTRAVENOUS
  Filled 2019-09-26: qty 500

## 2019-09-26 MED ORDER — SODIUM CHLORIDE 0.9 % IV SOLN
1.0000 g | Freq: Once | INTRAVENOUS | Status: AC
  Administered 2019-09-27: 01:00:00 1 g via INTRAVENOUS
  Filled 2019-09-26: qty 10

## 2019-09-26 MED ORDER — ONDANSETRON 4 MG PO TBDP
4.0000 mg | ORAL_TABLET | Freq: Once | ORAL | Status: DC
  Filled 2019-09-26: qty 1

## 2019-09-26 MED ORDER — ONDANSETRON HCL 4 MG/2ML IJ SOLN
4.0000 mg | Freq: Once | INTRAMUSCULAR | Status: AC
  Administered 2019-09-26: 4 mg via INTRAVENOUS
  Filled 2019-09-26: qty 2

## 2019-09-26 MED ORDER — LACTATED RINGERS IV BOLUS
500.0000 mL | Freq: Once | INTRAVENOUS | Status: AC
  Administered 2019-09-26: 23:00:00 500 mL via INTRAVENOUS

## 2019-09-26 MED ORDER — SODIUM CHLORIDE 0.9% FLUSH
3.0000 mL | Freq: Once | INTRAVENOUS | Status: AC
  Administered 2019-09-27: 3 mL via INTRAVENOUS

## 2019-09-26 NOTE — ED Provider Notes (Signed)
[facility] EMERGENCY DEPARTMENT Provider Note   CSN: [id number] Arrival date & time: 09/26/19  1657     History No chief complaint on file.   Richard Kent is a 86 y.o. male.  HPI Patient presents for 1 week of nausea, vomiting, and diarrhea.  Medical history notable for kidney stones, and diverticulosis.  He reports that when he has a bowel movement, he vomits at the same time.  This occurs approximately 2 times per day, every day.  He has been able to continue to eat and drink fluids.  He has felt progressively weak as the week has gone on.  Patient denies fevers, chills, chest pain, or shortness of breath.  He does endorse dysuria and cough.     Past Medical History:  Diagnosis Date  . Carotid stenosis   . CVA (cerebral infarction) 2011  . Diverticulosis   . Hypertension   . Kidney stone   . Smoker   . Stroke [facility]) 11/17/09    Patient Active Problem List   Diagnosis Date Noted  . Senile purpura ([place]) 01/09/2017  . Carotid stenosis, bilateral 01/14/2012  . Hypertension 01/12/2012  . Hyperlipidemia LDL goal <100 12/17/2009  . Hemiplegia, late effect of cerebrovascular disease ([place]) 12/17/2009  . RBBB 10/21/2009    Past Surgical History:  Procedure Laterality Date  . APPENDECTOMY    . HERNIA REPAIR     RIGHT  . HIP ARTHROPLASTY  01/15/2012   Procedure: ARTHROPLASTY BIPOLAR HIP;  Surgeon: Rozanna Box, MD;  Location: [facility];  Service: Orthopedics;  Laterality: Right;       Family History  Problem Relation Age of Onset  . Cancer Mother   . Stroke Mother   . Heart disease Father   . Cervical cancer Sister   . Heart attack Sister     Social History   Tobacco Use  . Smoking status: Former Smoker    Packs/day: 2.00    Years: 20.00    Pack years: 40.00    Types: Cigarettes    Quit date: 01/13/1972    Years since quitting: 47.7  . Smokeless tobacco: Never Used  Substance Use Topics  . Alcohol use: No    Comment: Patient quit alcohol  11/17/2009  . Drug use: No    Home Medications Prior to Admission medications   Medication Sig Start Date End Date Taking? Authorizing Provider  amLODipine (NORVASC) 10 MG tablet Take 1 tablet by mouth once daily 09/14/19   Denita Lung, MD  Ascorbic Acid (VITAMIN C PO) Take by mouth.    [provider]  aspirin 81 MG tablet Take 81 mg by mouth every morning.     [provider]  baclofen (LIORESAL) 10 MG tablet Take 1 tablet (10 mg total) by mouth 3 (three) times daily. 05/31/18   Denita Lung, MD  clopidogrel (PLAVIX) 75 MG tablet TAKE 1 TABLET BY MOUTH ONCE DAILY IN THE MORNING 09/14/19   Denita Lung, MD  lisinopril (ZESTRIL) 10 MG tablet Take 1 tablet by mouth once daily 08/25/19   Denita Lung, MD  Multiple Vitamin (MULTIVITAMIN WITH MINERALS) TABS Take 1 tablet by mouth every morning. Reported on 09/06/2015    [provider]  SENNOSIDES-DOCUSATE SODIUM PO Take 2 tablets by mouth at bedtime. At bedtime two tablets.    [provider]  simvastatin (ZOCOR) 20 MG tablet TAKE 1 TABLET BY MOUTH ONCE DAILY IN THE EVENING 09/14/19   Denita Lung, MD  tiZANidine (ZANAFLEX) 2 MG tablet Take 1 tablet by mouth twice daily 08/25/19   Denita Lung, MD  VITAMIN D PO Take by mouth.    [provider]    Allergies    Patient has no known allergies.  Review of Systems   Review of Systems  Constitutional: Positive for fatigue. Negative for activity change, appetite change, chills and fever.  HENT: Negative.   Eyes: Negative.   Respiratory: Positive for cough. Negative for shortness of breath and wheezing.   Cardiovascular: Negative.  Negative for chest pain, palpitations and leg swelling.  Gastrointestinal: Positive for diarrhea, nausea and vomiting. Negative for abdominal distention, abdominal pain and blood in stool.  Endocrine: Negative.   Genitourinary: Positive for dysuria. Negative for hematuria.  Musculoskeletal: Negative.  Negative  for arthralgias, back pain, myalgias and neck pain.  Skin: Negative.  Negative for rash and wound.  Neurological: Positive for weakness (Generalized). Negative for dizziness, syncope, light-headedness, numbness and headaches.  Hematological: Negative.   Psychiatric/Behavioral: Negative.     Physical Exam Updated Vital Signs BP (!) 133/56   Pulse 87   Temp 97.9 F (36.6 C) (Oral)   Resp 20   Ht 6' (1.829 m)   Wt 74.8 kg   SpO2 95%   BMI 22.38 kg/m   Physical Exam Vitals and nursing note reviewed.  Constitutional:      General: He is not in acute distress.    Appearance: Normal appearance. He is well-developed. He is not ill-appearing, toxic-appearing or diaphoretic.  HENT:     Head: Normocephalic and atraumatic.     Right Ear: External ear normal.     Left Ear: External ear normal.     Nose: Nose normal. No congestion or rhinorrhea.     Mouth/Throat:     Mouth: Mucous membranes are moist.     Pharynx: Oropharynx is clear.  Eyes:     General: No scleral icterus.    Conjunctiva/sclera: Conjunctivae normal.  Cardiovascular:     Rate and Rhythm: Normal rate and regular rhythm.     Heart sounds: No murmur.  Pulmonary:     Effort: Pulmonary effort is normal. No respiratory distress.     Breath sounds: Rales present. No wheezing.  Abdominal:     Palpations: Abdomen is soft.     Tenderness: There is no abdominal tenderness. There is no right CVA tenderness, left CVA tenderness, guarding or rebound.  Musculoskeletal:        General: No swelling or tenderness. Normal range of motion.     Cervical back: Normal range of motion and neck supple. No rigidity or tenderness.  Skin:    General: Skin is warm and dry.  Neurological:     General: No focal deficit present.     Mental Status: He is alert.     Cranial Nerves: No cranial nerve deficit.     Sensory: No sensory deficit.     Motor: No weakness.  Psychiatric:        Mood and Affect: Mood normal.        Behavior: Behavior  normal.     ED Results / Procedures / Treatments   Labs (all labs ordered are listed, but only abnormal results are displayed) Labs Reviewed  COMPREHENSIVE METABOLIC PANEL - Abnormal; Notable for the following components:      Result Value   Sodium 133 (*)    Chloride 97 (*)    CO2 21 (*)    Glucose, Bld 155 (*)  BUN 33 (*)    Calcium 8.1 (*)    GFR calc non Af Amer 57 (*)    All other components within normal limits  CBC - Abnormal; Notable for the following components:   WBC 13.8 (*)    All other components within normal limits  LIPASE, BLOOD  URINALYSIS, ROUTINE W REFLEX MICROSCOPIC    EKG EKG Interpretation  Date/Time:  Friday Sep 26 2019 17:58:30 EDT Ventricular Rate:  97 PR Interval:    QRS Duration: 128 QT Interval:  390 QTC Calculation: 495 R Axis:   -45 Text Interpretation: new Atrial fibrillation Left axis deviation Right bundle branch block Confirmed by Blanchie Dessert [phone]) on 09/26/2019 9:31:18 PM   Radiology No results found.  Procedures Procedures (including critical care time)  Medications Ordered in ED Medications  sodium chloride flush (NS) 0.9 % injection 3 mL (has no administration in time range)    ED Course  I have reviewed the triage vital signs and the nursing notes.  Pertinent labs & imaging results that were available during my care of the patient were reviewed by me and considered in my medical decision making (see chart for details).    MDM Rules/Calculators/A&P                      Patient is a 86 year old male who presents for 1week of persistent nausea, vomiting and diarrhea.  He also notices recent dysuria, hiccups, and cough.  He has been able to maintain p.o. intake, however he has felt progressively weaker as the symptoms persist.    Vital signs in the ED are notable for some slight tachycardia.  Heart rate ranges from 90s to 100s.  Patient is not in any acute distress.  His abdomen is quite soft and nontender.  He  denies any symptoms of pain over this past week, including in the ED.  He does endorse current nausea.  During the exam, patient did have coughing and hiccups.  Labs are notable for leukocytosis 13.8 and elevated BUN at 33.   Given history, tachycardia, and elevated BUN, patient is likely mildly dehydrated.  500 cc bolus of IV fluid was ordered.  IV Zofran was ordered for symptomatic relief of nausea.  Patient was yet to give a urine sample.  Given GI symptoms, cough, hiccups, and leukocytosis, pneumonia remains high in the differential.  Chest x-ray was ordered.  Covid testing was ordered as well.  Chest x-ray showed possible right pleural effusion with right basilar opacity, suggestive of focal pneumonia.  There was a finding of lucency inferior to the opacity, which could possibly represent loculated subdiaphragmatic gas collection.  This is unlikely given the benign soft abdomen on exam.  However, given basilar lung findings, and 1 week of persistent nausea and vomiting, CT of abdomen and pelvis was ordered to identify any intra-abdominal pathology, as well as to get CT visualization of the lung bases.  Patient was given ceftriaxone and azithromycin for empiric treatment of CAP.  Hospitalist was consulted for admission for pneumonia.  Given that the patient still had pending studies (CT of abdomen and pelvis, as well as urinalysis), hospitalist advised to call back when more data was available.  Care of patient was signed out to oncoming ED team.  Final Clinical Impression(s) / ED Diagnoses Final diagnoses:  None    Rx / DC Orders ED Discharge Orders    None       Godfrey Pick, MD 09/27/19 [phone]  Blanchie Dessert, MD 09/29/19 [phone]

## 2019-09-26 NOTE — ED Triage Notes (Signed)
Pt here from home with c/o abd pain and n/v/d for one week ,

## 2019-09-27 ENCOUNTER — Emergency Department (HOSPITAL_COMMUNITY): Payer: Medicare Other

## 2019-09-27 ENCOUNTER — Other Ambulatory Visit: Payer: Self-pay

## 2019-09-27 ENCOUNTER — Encounter (HOSPITAL_COMMUNITY): Payer: Self-pay | Admitting: Family Medicine

## 2019-09-27 DIAGNOSIS — E861 Hypovolemia: Secondary | ICD-10-CM | POA: Diagnosis not present

## 2019-09-27 DIAGNOSIS — R197 Diarrhea, unspecified: Secondary | ICD-10-CM | POA: Diagnosis not present

## 2019-09-27 DIAGNOSIS — I69959 Hemiplegia and hemiparesis following unspecified cerebrovascular disease affecting unspecified side: Secondary | ICD-10-CM

## 2019-09-27 DIAGNOSIS — R1110 Vomiting, unspecified: Secondary | ICD-10-CM | POA: Diagnosis not present

## 2019-09-27 DIAGNOSIS — N289 Disorder of kidney and ureter, unspecified: Secondary | ICD-10-CM | POA: Diagnosis not present

## 2019-09-27 DIAGNOSIS — I10 Essential (primary) hypertension: Secondary | ICD-10-CM

## 2019-09-27 DIAGNOSIS — Z7902 Long term (current) use of antithrombotics/antiplatelets: Secondary | ICD-10-CM | POA: Diagnosis not present

## 2019-09-27 DIAGNOSIS — Z96641 Presence of right artificial hip joint: Secondary | ICD-10-CM | POA: Diagnosis not present

## 2019-09-27 DIAGNOSIS — Z20822 Contact with and (suspected) exposure to covid-19: Secondary | ICD-10-CM | POA: Diagnosis not present

## 2019-09-27 DIAGNOSIS — R9431 Abnormal electrocardiogram [ECG] [EKG]: Secondary | ICD-10-CM | POA: Diagnosis not present

## 2019-09-27 DIAGNOSIS — Z87442 Personal history of urinary calculi: Secondary | ICD-10-CM | POA: Diagnosis not present

## 2019-09-27 DIAGNOSIS — Z7982 Long term (current) use of aspirin: Secondary | ICD-10-CM | POA: Diagnosis not present

## 2019-09-27 DIAGNOSIS — I4510 Unspecified right bundle-branch block: Secondary | ICD-10-CM | POA: Diagnosis not present

## 2019-09-27 DIAGNOSIS — E871 Hypo-osmolality and hyponatremia: Secondary | ICD-10-CM | POA: Diagnosis not present

## 2019-09-27 DIAGNOSIS — J9811 Atelectasis: Secondary | ICD-10-CM | POA: Diagnosis not present

## 2019-09-27 DIAGNOSIS — Z8249 Family history of ischemic heart disease and other diseases of the circulatory system: Secondary | ICD-10-CM | POA: Diagnosis not present

## 2019-09-27 DIAGNOSIS — I4891 Unspecified atrial fibrillation: Secondary | ICD-10-CM | POA: Diagnosis not present

## 2019-09-27 DIAGNOSIS — R300 Dysuria: Secondary | ICD-10-CM | POA: Diagnosis not present

## 2019-09-27 DIAGNOSIS — Z79899 Other long term (current) drug therapy: Secondary | ICD-10-CM | POA: Diagnosis not present

## 2019-09-27 DIAGNOSIS — Z823 Family history of stroke: Secondary | ICD-10-CM | POA: Diagnosis not present

## 2019-09-27 DIAGNOSIS — A084 Viral intestinal infection, unspecified: Secondary | ICD-10-CM | POA: Diagnosis not present

## 2019-09-27 DIAGNOSIS — R112 Nausea with vomiting, unspecified: Secondary | ICD-10-CM | POA: Diagnosis not present

## 2019-09-27 DIAGNOSIS — Z87891 Personal history of nicotine dependence: Secondary | ICD-10-CM | POA: Diagnosis not present

## 2019-09-27 LAB — CBC
HCT: 38.1 % — ABNORMAL LOW (ref 39.0–52.0)
Hemoglobin: 13.2 g/dL (ref 13.0–17.0)
MCH: 33.5 pg (ref 26.0–34.0)
MCHC: 34.6 g/dL (ref 30.0–36.0)
MCV: 96.7 fL (ref 80.0–100.0)
Platelets: 223 10*3/uL (ref 150–400)
RBC: 3.94 MIL/uL — ABNORMAL LOW (ref 4.22–5.81)
RDW: 13.1 % (ref 11.5–15.5)
WBC: 14.2 10*3/uL — ABNORMAL HIGH (ref 4.0–10.5)
nRBC: 0 % (ref 0.0–0.2)

## 2019-09-27 LAB — BASIC METABOLIC PANEL
Anion gap: 12 (ref 5–15)
BUN: 43 mg/dL — ABNORMAL HIGH (ref 8–23)
CO2: 19 mmol/L — ABNORMAL LOW (ref 22–32)
Calcium: 7.7 mg/dL — ABNORMAL LOW (ref 8.9–10.3)
Chloride: 104 mmol/L (ref 98–111)
Creatinine, Ser: 1.06 mg/dL (ref 0.61–1.24)
GFR calc Af Amer: >60 mL/min (ref >60)
GFR calc non Af Amer: >60 mL/min (ref >60)
Glucose, Bld: 114 mg/dL — ABNORMAL HIGH (ref 70–99)
Potassium: 3.5 mmol/L (ref 3.5–5.1)
Sodium: 135 mmol/L (ref 135–145)

## 2019-09-27 LAB — RESPIRATORY PANEL BY RT PCR (FLU A&B, COVID)
Influenza A by PCR: NEGATIVE
Influenza B by PCR: NEGATIVE
SARS Coronavirus 2 by RT PCR: NEGATIVE

## 2019-09-27 LAB — TSH: TSH: 0.544 u[IU]/mL (ref 0.350–4.500)

## 2019-09-27 MED ORDER — SACCHAROMYCES BOULARDII 250 MG PO CAPS
250.0000 mg | ORAL_CAPSULE | Freq: Two times a day (BID) | ORAL | Status: DC
  Administered 2019-09-27 – 2019-09-28 (×3): 250 mg via ORAL
  Filled 2019-09-27 (×3): qty 1

## 2019-09-27 MED ORDER — ACETAMINOPHEN 650 MG RE SUPP: 650.0000 mg | Freq: Four times a day (QID) | RECTAL | Status: DC | PRN

## 2019-09-27 MED ORDER — PANTOPRAZOLE SODIUM 40 MG IV SOLR
40.0000 mg | Freq: Two times a day (BID) | INTRAVENOUS | Status: DC
  Administered 2019-09-27 – 2019-09-28 (×4): 40 mg via INTRAVENOUS
  Filled 2019-09-27 (×4): qty 40

## 2019-09-27 MED ORDER — IOHEXOL 300 MG/ML  SOLN
100.0000 mL | Freq: Once | INTRAMUSCULAR | Status: AC | PRN
  Administered 2019-09-27: 100 mL via INTRAVENOUS

## 2019-09-27 MED ORDER — CLOPIDOGREL BISULFATE 75 MG PO TABS
75.0000 mg | ORAL_TABLET | Freq: Every day | ORAL | Status: DC
  Administered 2019-09-27 – 2019-09-28 (×2): 75 mg via ORAL
  Filled 2019-09-27 (×2): qty 1

## 2019-09-27 MED ORDER — ONDANSETRON HCL 4 MG PO TABS: 4.0000 mg | ORAL_TABLET | Freq: Four times a day (QID) | ORAL | Status: DC | PRN

## 2019-09-27 MED ORDER — ONDANSETRON HCL 4 MG/2ML IJ SOLN: 4.0000 mg | Freq: Four times a day (QID) | INTRAMUSCULAR | Status: DC | PRN

## 2019-09-27 MED ORDER — SODIUM CHLORIDE 0.9 % IV SOLN: INTRAVENOUS | Status: AC

## 2019-09-27 MED ORDER — ACETAMINOPHEN 325 MG PO TABS: 650.0000 mg | ORAL_TABLET | Freq: Four times a day (QID) | ORAL | Status: DC | PRN

## 2019-09-27 NOTE — Plan of Care (Signed)
  Problem: Clinical Measurements: Goal: Will remain free from infection Outcome: Progressing   Problem: Nutrition: Goal: Adequate nutrition will be maintained Outcome: Progressing   Problem: Elimination: Goal: Will not experience complications related to bowel motility Outcome: Progressing

## 2019-09-27 NOTE — Progress Notes (Signed)
86 year old male admitted this morning with nausea vomiting and diarrhea.  Has had no vomiting or diarrhea.  On clear liquids will advance diet.  Potassium 3.5 down from 4 yesterday.  Check magnesium.  Recheck labs in a.m.  Consult physical therapy.  His white count is elevated at 14.  Stool studies ordered.  UA positive for ketones negative for leukocytes or nitrites. CT abdomen findings concerning for esophagitis diffuse circumferential thickening of the distal esophagus.  Colonic diverticulosis. Continue PPI Follow-up stool studies Consult physical therapy Advance diet

## 2019-09-27 NOTE — H&P (Signed)
History and Physical    [name] [id number] DOB: Feb 21, 1929 DOA: 09/26/2019  PCP: Denita Lung, MD   Patient coming from: Home   Chief Complaint: Abdominal pain, N/V/D   HPI: Richard Kent is a 86 y.o. male with medical history significant for CVA, hypertension, and carotid stenosis, now presenting to the emergency department for evaluation of abdominal pain, nausea, vomiting, and diarrhea.  Patient is very pleasant but gives obviously incorrect answers to basic questions and no family is available at time of admission, making the history difficult to obtain.  Patient has reportedly been experiencing abdominal pain with nausea, vomiting, and diarrhea for 1 week.  He actually denies diarrhea despite having just had multiple liquid stools in the ED.  ED Course: Upon arrival to the ED, patient is found to be afebrile, saturating low 90s on room air, and with normal BP, HR, and RR.  EKG features irregular rhythm with LAD and RBBB.  Chest x-ray with cardiomegaly and bibasilar atelectasis or infiltrate, possible right pleural effusion, and question of possible subdiaphragmatic gas.  CT the abdomen and pelvis is notable for esophageal wall thickening and diarrheal state.  Chemistry panel with slight hyponatremia and BUN 33, higher than priors.  CBC features a leukocytosis to 13,800.  Covid and influenza PCR are negative.  Patient was given 500 cc of IV fluids, Zofran, Rocephin, and azithromycin in the ED.  Review of Systems:  Unable to complete ROS secondary the patient's clinical condition.  Past Medical History:  Diagnosis Date  . Carotid stenosis   . CVA (cerebral infarction) 2011  . Diverticulosis   . Hypertension   . Kidney stone   . Smoker   . Stroke [facility] 11/17/09    Past Surgical History:  Procedure Laterality Date  . APPENDECTOMY    . HERNIA REPAIR     RIGHT  . HIP ARTHROPLASTY  01/15/2012   Procedure: ARTHROPLASTY BIPOLAR HIP;  Surgeon: Rozanna Box, MD;  Location:  [facility];  Service: Orthopedics;  Laterality: Right;     reports that he quit smoking about 47 years ago. His smoking use included cigarettes. He has a 40.00 pack-year smoking history. He has never used smokeless tobacco. He reports that he does not drink alcohol or use drugs.  No Known Allergies  Family History  Problem Relation Age of Onset  . Cancer Mother   . Stroke Mother   . Heart disease Father   . Cervical cancer Sister   . Heart attack Sister      Prior to Admission medications   Medication Sig Start Date End Date Taking? Authorizing Provider  amLODipine (NORVASC) 10 MG tablet Take 1 tablet by mouth once daily 09/14/19   Denita Lung, MD  Ascorbic Acid (VITAMIN C PO) Take by mouth.    [provider]  aspirin 81 MG tablet Take 81 mg by mouth every morning.     [provider]  baclofen (LIORESAL) 10 MG tablet Take 1 tablet (10 mg total) by mouth 3 (three) times daily. 05/31/18   Denita Lung, MD  clopidogrel (PLAVIX) 75 MG tablet TAKE 1 TABLET BY MOUTH ONCE DAILY IN THE MORNING 09/14/19   Denita Lung, MD  lisinopril (ZESTRIL) 10 MG tablet Take 1 tablet by mouth once daily 08/25/19   Denita Lung, MD  Multiple Vitamin (MULTIVITAMIN WITH MINERALS) TABS Take 1 tablet by mouth every morning. Reported on 09/06/2015    [provider]  SENNOSIDES-DOCUSATE SODIUM PO Take 2 tablets  by mouth at bedtime. At bedtime two tablets.    [provider]  simvastatin (ZOCOR) 20 MG tablet TAKE 1 TABLET BY MOUTH ONCE DAILY IN THE EVENING 09/14/19   Denita Lung, MD  tiZANidine (ZANAFLEX) 2 MG tablet Take 1 tablet by mouth twice daily 08/25/19   Denita Lung, MD  VITAMIN D PO Take by mouth.    [provider]    Physical Exam: Vitals:   09/27/19 0200 09/27/19 0230 09/27/19 0245 09/27/19 0300  BP: 126/83 105/67  (!) 108/48  Pulse: (!) 104 84  77  Resp: 18     Temp:   99.2 F (37.3 C)   TempSrc:   Rectal   SpO2: 95% 92%  90%  Weight:       Height:        Constitutional: NAD, calm  Eyes: PERTLA, lids and conjunctivae normal ENMT: Mucous membranes are moist. Posterior pharynx clear of any exudate or lesions.   Neck: normal, supple, no masses, no thyromegaly Respiratory: no wheezing, no crackles. No accessory muscle use.  Cardiovascular: S1 & S2 heard, regular rate and rhythm. No extremity edema. No significant JVD. Abdomen: No distension, no tenderness, soft. Bowel sounds active.  Musculoskeletal: no clubbing / cyanosis. No joint deformity upper and lower extremities.   Skin: no significant rashes, lesions, ulcers. Warm, dry, well-perfused. Neurologic: no gross facial asymmetry. Sensation intact. Moving all extremities.  Psychiatric: Alert and oriented to person only. Very pleasant and cooperative.    Labs and Imaging on Admission: I have personally reviewed following labs and imaging studies  CBC: Recent Labs  Lab 09/26/19 1855  WBC 13.8*  HGB 15.1  HCT 44.8  MCV 97.6  PLT 0000000   Basic Metabolic Panel: Recent Labs  Lab 09/26/19 1855  NA 133*  K 4.0  CL 97*  CO2 21*  GLUCOSE 155*  BUN 33*  CREATININE 1.15  CALCIUM 8.1*   GFR: Estimated Creatinine Clearance: 47 mL/min (by C-G formula based on SCr of 1.15 mg/dL). Liver Function Tests: Recent Labs  Lab 09/26/19 1855  AST 26  ALT 21  ALKPHOS 44  BILITOT 0.9  PROT 6.6  ALBUMIN 3.6   Recent Labs  Lab 09/26/19 1855  LIPASE 29   No results for input(s): AMMONIA in the last 168 hours. Coagulation Profile: No results for input(s): INR, PROTIME in the last 168 hours. Cardiac Enzymes: No results for input(s): CKTOTAL, CKMB, CKMBINDEX, TROPONINI in the last 168 hours. BNP (last 3 results) No results for input(s): PROBNP in the last 8760 hours. HbA1C: No results for input(s): HGBA1C in the last 72 hours. CBG: No results for input(s): GLUCAP in the last 168 hours. Lipid Profile: No results for input(s): CHOL, HDL, LDLCALC, TRIG, CHOLHDL, LDLDIRECT  in the last 72 hours. Thyroid Function Tests: No results for input(s): TSH, T4TOTAL, FREET4, T3FREE, THYROIDAB in the last 72 hours. Anemia Panel: No results for input(s): VITAMINB12, FOLATE, FERRITIN, TIBC, IRON, RETICCTPCT in the last 72 hours. Urine analysis:    Component Value Date/Time   COLORURINE YELLOW 01/13/2012 [place] 01/13/2012 1638   LABSPEC 1.023 01/13/2012 1638   PHURINE 6.0 01/13/2012 1638   GLUCOSEU NEGATIVE 01/13/2012 1638   HGBUR NEGATIVE 01/13/2012 1638   BILIRUBINUR NEGATIVE 01/13/2012 1638   KETONESUR 15 (A) 01/13/2012 1638   PROTEINUR NEGATIVE 01/13/2012 1638   UROBILINOGEN 1.0 01/13/2012 1638   NITRITE NEGATIVE 01/13/2012 1638   LEUKOCYTESUR NEGATIVE 01/13/2012 1638   Sepsis Labs: @LABRCNTIP (procalcitonin:4,lacticidven:4) )  Recent Results (from the past 240 hour(s))  Respiratory Panel by RT PCR (Flu A&B, Covid) - Nasopharyngeal Swab     Status: None   Collection Time: 09/27/19 12:53 AM   Specimen: Nasopharyngeal Swab  Result Value Ref Range Status   SARS Coronavirus 2 by RT PCR NEGATIVE NEGATIVE Final    Comment: (NOTE) SARS-CoV-2 target nucleic acids are NOT DETECTED. The SARS-CoV-2 RNA is generally detectable in upper respiratoy specimens during the acute phase of infection. The lowest concentration of SARS-CoV-2 viral copies this assay can detect is 131 copies/mL. A negative result does not preclude SARS-Cov-2 infection and should not be used as the sole basis for treatment or other patient management decisions. A negative result may occur with  improper specimen collection/handling, submission of specimen other than nasopharyngeal swab, presence of viral mutation(s) within the areas targeted by this assay, and inadequate number of viral copies (<131 copies/mL). A negative result must be combined with clinical observations, patient history, and epidemiological information. The expected result is Negative. Fact Sheet for  Patients:  PinkCheek.be Fact Sheet for Healthcare Providers:  GravelBags.it This test is not yet ap proved or cleared by the [place] FDA and  has been authorized for detection and/or diagnosis of SARS-CoV-2 by FDA under an Emergency Use Authorization (EUA). This EUA will remain  in effect (meaning this test can be used) for the duration of the COVID-19 declaration under Section 564(b)(1) of the Act, 21 U.S.C. section 360bbb-3(b)(1), unless the authorization is terminated or revoked sooner.    Influenza A by PCR NEGATIVE NEGATIVE Final   Influenza B by PCR NEGATIVE NEGATIVE Final    Comment: (NOTE) The Xpert Xpress SARS-CoV-2/FLU/RSV assay is intended as an aid in  the diagnosis of influenza from Nasopharyngeal swab specimens and  should not be used as a sole basis for treatment. Nasal washings and  aspirates are unacceptable for Xpert Xpress SARS-CoV-2/FLU/RSV  testing. Fact Sheet for Patients: PinkCheek.be Fact Sheet for Healthcare Providers: GravelBags.it This test is not yet approved or cleared by the [place] FDA and  has been authorized for detection and/or diagnosis of SARS-CoV-2 by  FDA under an Emergency Use Authorization (EUA). This EUA will remain  in effect (meaning this test can be used) for the duration of the  Covid-19 declaration under Section 564(b)(1) of the Act, 21  U.S.C. section 360bbb-3(b)(1), unless the authorization is  terminated or revoked. Performed at [facility], [place] [address] [place], [place] [address]      Radiological Exams on Admission: CT ABDOMEN PELVIS W CONTRAST  Result Date: 09/27/2019 CLINICAL DATA:  86 year old male with nausea vomiting. EXAM: CT ABDOMEN AND PELVIS WITH CONTRAST TECHNIQUE: Multidetector CT imaging of the abdomen and pelvis was performed using the standard protocol following bolus  administration of intravenous contrast. CONTRAST:  147mL OMNIPAQUE IOHEXOL 300 MG/ML  SOLN COMPARISON:  CT abdomen pelvis dated 06/04/2010. FINDINGS: Lower chest: There are bibasilar bronchiectasis with probable mucous impaction in the left lung base. Coronary vascular calcification. No intra-abdominal free air or free fluid. Hepatobiliary: No focal liver abnormality is seen. No gallstones, gallbladder wall thickening, or biliary dilatation. Pancreas: Unremarkable. No pancreatic ductal dilatation or surrounding inflammatory changes. Spleen: Normal in size without focal abnormality. Adrenals/Urinary Tract: The adrenal glands are unremarkable. There is no hydronephrosis on either side. There is symmetric enhancement and excretion of contrast by both kidneys. The visualized ureters and urinary bladder appear unremarkable. Stomach/Bowel: There is diffuse circumferential thickening of the visualized distal esophagus concerning  for esophagitis. Clinical correlation is recommended. Small amount of fluid within the distal esophagus may represent reflux or delayed clearance. There is loose stool throughout the colon compatible with diarrheal state. There are several scattered colonic diverticula without active inflammatory changes. Normal caliber fluid-filled loops of small bowel likely physiologic. Enteritis is not excluded. There is no bowel obstruction. Appendectomy. Vascular/Lymphatic: Advanced aortoiliac atherosclerotic disease. The IVC is unremarkable. No portal venous gas. There is no adenopathy. Reproductive: The prostate and seminal vesicles are grossly unremarkable. Other: None Musculoskeletal: Osteopenia with degenerative changes of the spine. Total right hip arthroplasty. No acute osseous pathology. IMPRESSION: 1. Findings concerning for esophagitis. Clinical correlation is recommended. 2. Diarrheal state. Correlation with clinical exam and stool cultures recommended. No bowel obstruction. 3. Colonic  diverticulosis. 4. Aortic Atherosclerosis (ICD10-I70.0). Electronically Signed   By: Anner Crete M.D.   On: 09/27/2019 00:59   DG Chest Portable 1 View  Result Date: 09/26/2019 CLINICAL DATA:  Vomiting EXAM: PORTABLE CHEST 1 VIEW COMPARISON:  01/25/2012 FINDINGS: Cardiomegaly with aortic atherosclerosis. Streaky basilar opacities. No pneumothorax. Possible right pleural effusion. IMPRESSION: 1. Cardiomegaly with hazy basilar atelectasis or infiltrate 2. Possible right pleural effusion. Bandlike opacity at the right lung base with lucency inferior to this, likely reflects atelectasis and aerated lung, however suggest decubitus view or dedicated upright abdominal radiograph to exclude loculated subdiaphragmatic gas collection. Electronically Signed   By: Donavan Foil M.D.   On: 09/26/2019 23:22    EKG: Independently reviewed. Irregular rhythm, LAD, RBBB.   Assessment/Plan   1. Diarrhea  - Presents with reports of N/V/D and has been having liquid stools in ED  - Abdominal exam is benign  - He is hypovolemic with mild renal insufficiency  - Check GI pathogen panel, continue enteric precautions, continue IVF hydration, monitor electrolytes   2. ?GI bleeding  - Presents with N/V/D, diarrhea very dark per RN and CT findings suggest possible esophagitis  - Check FOBT, start PPI, repeat CBC in am   3. Hypertension  - BP at goal, pharmacy medication-reconciliation pending    4. History of CVA  - No acute changes in neurologic status reported  - Follow-up pharmacy medication-reconciliation   5. Confusion  - Patient is pleasantly confused in ED and unable to reach family or determine baseline  - Continue supportive care, clarify baseline with family in am    6. ?Atrial fibrillation  - Has irregular rhythm, looks to be atrial fibrillation on EKG  - Repeat EKG, check TSH, no anticoagulation for now while evaluating possible GIB    DVT prophylaxis: SCDs Code Status: Full for now as  patient disoriented and no family available at this time  Family Communication: Discussed with patient  Disposition Plan:  Patient is from: Home  Anticipated d/c is to: Home  Anticipated d/c date is: 09/28/19 Patient currently: Pending FOBT, C diff testing, IVF hydration  Consults called: None  Admission status: Observation     Vianne Bulls, MD [facility] Pager: See [email or web address]  If 7AM-7PM, please contact the daytime attending [email or web address]  09/27/2019, 3:14 AM

## 2019-09-27 NOTE — ED Notes (Signed)
Pt transported to CT ?

## 2019-09-27 NOTE — ED Notes (Signed)
Attempted to call report x 1  

## 2019-09-27 NOTE — ED Notes (Signed)
Stool was liquid watery, dark color. No formed stool.

## 2019-09-27 NOTE — ED Provider Notes (Signed)
Patient signed out to me at end of shift by Godfrey Pick, MD, resident, and Dr. Blanchie Dessert, ED attending.  The patient presented to the ED for evaluation of N, V, D and lower abdominal pain, found to have a cough. Work up per previous treatment team reveals need for admission for CAP based on CXR findings, age, port score. CT pending at time of sign out resulted and c/w diarrheal illness, esophagitis. Recommended stool cultures. VSS.   On my exam, the patient is awake, alert, nontoxic and comfortable. Having a loose bowel movement currently but then denies that he has had diarrhea or vomiting. ?Dementia as, per previous treatment team, he has +N/V/D for the past week, but history of dementia not documented.   Plan, re-consult hospitalist for admission after CT results. Discussed with dr. Myna Hidalgo who will consider admission for CAP in observation adm. COVID negative. Appreciate his help with his care.    Charlann Lange, PA-C 09/27/19 0235    Blanchie Dessert, MD 09/29/19 [phone]

## 2019-09-28 ENCOUNTER — Other Ambulatory Visit (HOSPITAL_COMMUNITY): Payer: Medicare Other

## 2019-09-28 ENCOUNTER — Inpatient Hospital Stay (HOSPITAL_COMMUNITY): Payer: Medicare Other

## 2019-09-28 DIAGNOSIS — R9431 Abnormal electrocardiogram [ECG] [EKG]: Secondary | ICD-10-CM

## 2019-09-28 LAB — CBC
HCT: 35.1 % — ABNORMAL LOW (ref 39.0–52.0)
Hemoglobin: 12.4 g/dL — ABNORMAL LOW (ref 13.0–17.0)
MCH: 34.3 pg — ABNORMAL HIGH (ref 26.0–34.0)
MCHC: 35.3 g/dL (ref 30.0–36.0)
MCV: 97 fL (ref 80.0–100.0)
Platelets: 219 10*3/uL (ref 150–400)
RBC: 3.62 MIL/uL — ABNORMAL LOW (ref 4.22–5.81)
RDW: 13.2 % (ref 11.5–15.5)
WBC: 10.5 10*3/uL (ref 4.0–10.5)
nRBC: 0 % (ref 0.0–0.2)

## 2019-09-28 LAB — BASIC METABOLIC PANEL
Anion gap: 8 (ref 5–15)
BUN: 23 mg/dL (ref 8–23)
CO2: 22 mmol/L (ref 22–32)
Calcium: 7.8 mg/dL — ABNORMAL LOW (ref 8.9–10.3)
Chloride: 106 mmol/L (ref 98–111)
Creatinine, Ser: 0.95 mg/dL (ref 0.61–1.24)
GFR calc Af Amer: >60 mL/min (ref >60)
GFR calc non Af Amer: >60 mL/min (ref >60)
Glucose, Bld: 110 mg/dL — ABNORMAL HIGH (ref 70–99)
Potassium: 3.5 mmol/L (ref 3.5–5.1)
Sodium: 136 mmol/L (ref 135–145)

## 2019-09-28 LAB — URINALYSIS, ROUTINE W REFLEX MICROSCOPIC
Bilirubin Urine: NEGATIVE
Glucose, UA: NEGATIVE mg/dL
Hgb urine dipstick: NEGATIVE
Ketones, ur: NEGATIVE mg/dL
Leukocytes,Ua: NEGATIVE
Nitrite: NEGATIVE
Protein, ur: NEGATIVE mg/dL
Specific Gravity, Urine: 1.013 (ref 1.005–1.030)
pH: 5 (ref 5.0–8.0)

## 2019-09-28 LAB — ECHOCARDIOGRAM COMPLETE
Height: 72 in
Weight: 2960 oz

## 2019-09-28 MED ORDER — AMLODIPINE BESYLATE 2.5 MG PO TABS: 2.5000 mg | ORAL_TABLET | Freq: Every day | ORAL | 2 refills | Status: DC

## 2019-09-28 MED ORDER — BACLOFEN 10 MG PO TABS: 10.0000 mg | ORAL_TABLET | Freq: Three times a day (TID) | ORAL | 3 refills | Status: DC | PRN

## 2019-09-28 MED ORDER — APIXABAN 5 MG PO TABS: 5.0000 mg | ORAL_TABLET | Freq: Two times a day (BID) | ORAL | Status: DC

## 2019-09-28 MED ORDER — APIXABAN 5 MG PO TABS: 5.0000 mg | ORAL_TABLET | Freq: Two times a day (BID) | ORAL | 2 refills | Status: DC

## 2019-09-28 MED ORDER — ONDANSETRON HCL 4 MG PO TABS: 4.0000 mg | ORAL_TABLET | Freq: Four times a day (QID) | ORAL | 0 refills | Status: DC | PRN

## 2019-09-28 MED ORDER — SACCHAROMYCES BOULARDII 250 MG PO CAPS: 250.0000 mg | ORAL_CAPSULE | Freq: Two times a day (BID) | ORAL | 1 refills | Status: DC

## 2019-09-28 MED ORDER — CARVEDILOL 3.125 MG PO TABS: 3.1250 mg | ORAL_TABLET | Freq: Two times a day (BID) | ORAL | 11 refills | Status: DC

## 2019-09-28 NOTE — Plan of Care (Signed)
°  Problem: Clinical Measurements: °Goal: Will remain free from infection °Outcome: Progressing °  °Problem: Activity: °Goal: Risk for activity intolerance will decrease °Outcome: Progressing °  °Problem: Nutrition: °Goal: Adequate nutrition will be maintained °Outcome: Progressing °  °

## 2019-09-28 NOTE — Progress Notes (Signed)
Physical Therapy Treatment Patient Details Name: Richard Kent MRN: [id number] DOB: 1928-07-21 Today's Date: 09/28/2019    History of Present Illness Pt adm with nausea, vomiting, and diarrhea. PMH - CVA with rt hemiparesis, HTN, rt THR.    PT Comments    Some improvement with mobility with shoes and AFO on. Expect will do better in home environment. HHPT to continue with rehab.    Follow Up Recommendations  Home health PT;Supervision/Assistance - 24 hour     Equipment Recommendations  None recommended by PT    Recommendations for Other Services       Precautions / Restrictions Precautions Precautions: Fall Required Braces or Orthoses: Other Brace Other Brace: rt AFO Restrictions Weight Bearing Restrictions: No    Mobility  Bed Mobility Overal bed mobility: Needs Assistance Bed Mobility: Supine to Sit;Sit to Supine     Supine to sit: Min assist Sit to supine: Supervision   General bed mobility comments: Assist to elevate trunk into sitting and bring hips to EOB  Transfers Overall transfer level: Needs assistance Equipment used: 1 person hand held assist Transfers: Sit to/from [facility] Sit to Stand: Mod assist;From elevated surface Stand pivot transfers: Max assist       General transfer comment: Placed shoes and rt AFO on pt. Assist to bring hips up and for balance. Pt with posterior bias. Used hand held assist. bed to bsc with max assist due to difficulty for pt making pivotal steps  Ambulation/Gait                 Stairs             Wheelchair Mobility    Modified Rankin (Stroke Patients Only)       Balance Overall balance assessment: Needs assistance Sitting-balance support: No upper extremity supported;Feet supported Sitting balance-Leahy Scale: Fair     Standing balance support: Bilateral upper extremity supported Standing balance-Leahy Scale: Poor Standing balance comment: Once up in standing min assist to  maintain.                            Cognition Arousal/Alertness: Awake/alert Behavior During Therapy: WFL for tasks assessed/performed Overall Cognitive Status: No family/caregiver present to determine baseline cognitive functioning Area of Impairment: Memory;Orientation                 Orientation Level: Disoriented to;Place;Time;Situation   Memory: Decreased recall of precautions;Decreased short-term memory         General Comments: Cognition likely baseline      Exercises      General Comments        Pertinent Vitals/Pain Pain Assessment: Faces Faces Pain Scale: Hurts little more Pain Location: rt foot putting shoes on  Pain Descriptors / Indicators: Grimacing Pain Intervention(s): Limited activity within patient's tolerance;Monitored during session    Home Living Family/patient expects to be discharged to:: Private residence Living Arrangements: Spouse/significant other Available Help at Discharge: Family;Available 24 hours/day         Home Equipment: Cane - quad;Tub bench;Wheelchair - manual      Prior Function Level of Independence: Needs assistance  Gait / Transfers Assistance Needed: amb short distances with cane and assist of wife       PT Goals (current goals can now be found in the care plan section) Acute Rehab PT Goals Patient Stated Goal: go home PT Goal Formulation: With patient Time For Goal Achievement: 10/05/19 Potential to Achieve Goals: Good  Progress towards PT goals: Progressing toward goals    Frequency    Min 3X/week      PT Plan Current plan remains appropriate    Co-evaluation              AM-PAC PT "6 Clicks" Mobility   Outcome Measure  Help needed turning from your back to your side while in a flat bed without using bedrails?: A Little Help needed moving from lying on your back to sitting on the side of a flat bed without using bedrails?: A Little Help needed moving to and from a bed to a  chair (including a wheelchair)?: Total Help needed standing up from a chair using your arms (e.g., wheelchair or bedside chair)?: Total Help needed to walk in hospital room?: Total Help needed climbing 3-5 steps with a railing? : Total 6 Click Score: 10    End of Session Equipment Utilized During Treatment: Gait belt Activity Tolerance: Patient tolerated treatment well Patient left: in bed;with call bell/phone within reach;with bed alarm set;with nursing/sitter in room Nurse Communication: Mobility status PT Visit Diagnosis: Other abnormalities of gait and mobility (R26.89);Hemiplegia and hemiparesis Hemiplegia - Right/Left: Right Hemiplegia - dominant/non-dominant: Dominant Hemiplegia - caused by: Cerebral infarction     Time: 1205-1229 PT Time Calculation (min) (ACUTE ONLY): 24 min  Charges:  $Therapeutic Activity: 23-37 mins                     [place] Pager [phone] Office [place] 09/28/2019, 1:24 PM

## 2019-09-28 NOTE — TOC Transition Note (Addendum)
Transition of Care [facility]) - CM/SW Discharge Note Marvetta Gibbons RN, BSN Transitions of Care Unit 4E- RN Case Manager [phone] [facility] Coverage   Patient Details  Name: Richard Kent MRN: [id number] Date of Birth: 1929-05-20  [facility]) CM/SW Contact:  [name], RN Phone Number: 09/28/2019, 2:23 PM   Clinical Narrative:    Pt stable for transition home today, orders placed for HHPT. Call made to daughter [name] regarding [place] needs- family agreeable to [facility] services- choice provided to daughter over the phone based on  CMS guidelines from medicare.gov website with star ratings (copy placed in shadow chart)- per daughter she would like to try [facility] or [facility].  Daughter to come provide transport home. Daughter also asked about additional aide services- pt is a veteran however has not signed up for VA benefits, pt also does not have medicaid- explained to daughter medicare does not cover this type of in home services and it would be out of pocket expense- offered some websites to assist and printed off a list per daughter request from one of the websites.  Call made to [facility] and msg left for [facility] referral- awaiting return call to see if referral can be accepted for needed HHPT. - return call received at [phone]- [facility] has accepted referral and will do start of care by Tues. 5/11 [place] list and private duty list left at bedside with pt for daughter.     Final next level of care: [place] Barriers to Discharge: No Barriers Identified   Patient Goals and CMS Choice Patient states their goals for this hospitalization and ongoing recovery are:: return home CMS Medicare.gov Compare Post Acute Care list provided to:: Patient Represenative (must comment) Choice offered to / list presented to : Adult Children  Discharge Placement                 Home with [facility]      Discharge Plan and Services   Discharge Planning Services: CM Consult Post  Acute Care Choice: Home Health          DME Arranged: N/A DME Agency: NA       HH Arranged: PT HH Agency: [place] Date [place]: 09/28/19 Time [place]: [id number] Representative spoke with at [place]: [place] ([place]) Interventions     Readmission Risk Interventions Readmission Risk Prevention Plan 09/28/2019  Post Dischage Appt Complete  Medication Screening Complete  Transportation Screening Complete  Some recent data might be hidden

## 2019-09-28 NOTE — Plan of Care (Signed)
  Problem: Education: Goal: Knowledge of General Education information will improve Description: Including pain rating scale, medication(s)/side effects and non-pharmacologic comfort measures Outcome: Adequate for Discharge   Problem: Health Behavior/Discharge Planning: Goal: Ability to manage health-related needs will improve Outcome: Adequate for Discharge   Problem: Clinical Measurements: Goal: Ability to maintain clinical measurements within normal limits will improve Outcome: Adequate for Discharge Goal: Will remain free from infection 09/28/2019 1408 by Dolores Hoose, RN Outcome: Adequate for Discharge 09/28/2019 0804 by Dolores Hoose, RN Outcome: Progressing Goal: Diagnostic test results will improve Outcome: Adequate for Discharge Goal: Respiratory complications will improve Outcome: Adequate for Discharge Goal: Cardiovascular complication will be avoided Outcome: Adequate for Discharge

## 2019-09-28 NOTE — Progress Notes (Signed)
  Echocardiogram 2D Echocardiogram has been performed.  Richard Kent 09/28/2019, 2:19 PM

## 2019-09-28 NOTE — Progress Notes (Signed)
DISCHARGE NOTE HOME Richard Kent to be discharged Home per MD order. Discussed prescriptions and follow up appointments with the patient. Prescriptions given to patient; medication list explained in detail. Patient verbalized understanding.  Skin clean, dry and intact without evidence of skin break down, no evidence of skin tears noted. IV catheter discontinued intact. Site without signs and symptoms of complications. Dressing and pressure applied. Pt denies pain at the site currently. No complaints noted.  Patient free of lines, drains, and wounds.   An After Visit Summary (AVS) was printed and given to the patient and daughter. Patient escorted via wheelchair, and discharged home via private auto.  Dolores Hoose, RN

## 2019-09-28 NOTE — Discharge Instructions (Signed)
Please check your blood pressure daily and write it down Follow-up with your PCP in 2 weeks.  Take the blood pressure log along with you to see the primary care physician to adjust your dose of blood pressure medications. I have decreased your dose of Norvasc to 2.5 mg daily.

## 2019-09-28 NOTE — Progress Notes (Signed)
ANTICOAGULATION CONSULT NOTE - Initial Consult  Pharmacy Consult for Apixaban Indication: atrial fibrillation  No Known Allergies  Patient Measurements: Height: 6' (182.9 cm) Weight: 83.9 kg (185 lb) IBW/kg (Calculated) : 77.6  Vital Signs: Temp: 97.7 F (36.5 C) (05/09 0629) Temp Source: Oral (05/09 0629) BP: 133/57 (05/09 0629) Pulse Rate: 70 (05/09 0629)  Labs: Recent Labs    09/26/19 1855 09/26/19 1855 09/27/19 0514 09/28/19 0301  HGB 15.1   < > 13.2 12.4*  HCT 44.8  --  38.1* 35.1*  PLT 254  --  223 219  CREATININE 1.15  --  1.06 0.95   < > = values in this interval not displayed.    Estimated Creatinine Clearance: 59 mL/min (by C-G formula based on SCr of 0.95 mg/dL).   Medical History: Past Medical History:  Diagnosis Date  . Carotid stenosis   . CVA (cerebral infarction) 2011  . Diverticulosis   . Hypertension   . Kidney stone   . Smoker   . Stroke ([place]) 11/17/09    Medications:  Facility-Administered Medications Prior to Admission  Medication Dose Route Frequency Provider Last Rate Last Admin  . botulinum toxin Type A (BOTOX) injection 300 Units  300 Units Intramuscular Once Marcial Pacas, MD       Medications Prior to Admission  Medication Sig Dispense Refill Last Dose  . amLODipine (NORVASC) 10 MG tablet Take 1 tablet by mouth once daily 90 tablet 3 09/26/2019 at Unknown time  . Ascorbic Acid (VITAMIN C PO) Take by mouth.   09/26/2019 at Unknown time  . aspirin 81 MG tablet Take 81 mg by mouth every morning.    09/26/2019 at Unknown time  . clopidogrel (PLAVIX) 75 MG tablet TAKE 1 TABLET BY MOUTH ONCE DAILY IN THE MORNING (Patient taking differently: Take 75 mg by mouth daily. TAKE 1 TABLET BY MOUTH ONCE DAILY IN THE MORNING) 90 tablet 3 09/26/2019 at Unknown time  . lisinopril (ZESTRIL) 10 MG tablet Take 1 tablet by mouth once daily 90 tablet 0 09/26/2019 at Unknown time  . Multiple Vitamin (MULTIVITAMIN WITH MINERALS) TABS Take 1 tablet by mouth every  morning. Reported on 09/06/2015   09/26/2019 at Unknown time  . SENNOSIDES-DOCUSATE SODIUM PO Take 2 tablets by mouth at bedtime. At bedtime two tablets.   09/26/2019 at Unknown time  . simvastatin (ZOCOR) 20 MG tablet TAKE 1 TABLET BY MOUTH ONCE DAILY IN THE EVENING (Patient taking differently: Take 20 mg by mouth daily at 6 PM. ) 90 tablet 3 09/26/2019 at Unknown time  . tiZANidine (ZANAFLEX) 2 MG tablet Take 1 tablet by mouth twice daily 180 tablet 0 09/26/2019 at Unknown time  . VITAMIN D PO Take by mouth.   09/26/2019 at Unknown time  . [DISCONTINUED] baclofen (LIORESAL) 10 MG tablet Take 1 tablet (10 mg total) by mouth 3 (three) times daily. 270 tablet 3 09/26/2019 at Unknown time    Assessment: 86 yo M presented to the ED with N/V/D and abdominal pain.  Also noted to be in afib.  Pt not on anticoagulation PTA.  Pharmacy has been asked to start apixaban.  Goal of Therapy:  Therapeutic Anticoagulation Monitor platelets by anticoagulation protocol: Yes   Plan:  Eliquis 5mg  PO BID Patient only meets 1 of the 3 criteria for dose reduction - ok for full dose. Monitor for sx of bleed. Eliquis education  [place], Pharm.D., BCPS Clinical Pharmacist Clinical phone for 09/28/2019 from 8:30-4:00 is [phone].  **Pharmacist phone directory can  be found on [email or web address] listed under [place].  09/28/2019 11:41 AM

## 2019-09-28 NOTE — Discharge Summary (Signed)
Physician Discharge Summary  Jammar Omans [id number] DOB: 04/15/1929 DOA: 09/26/2019  PCP: Denita Lung, MD  Admit date: 09/26/2019 Discharge date: 09/28/2019  Admitted From:home Disposition: home  Recommendations for Outpatient Follow-up:  1. Follow up with PCP in 1-2 weeks 2. Please obtain BMP/CBC in one week 3. Please note that I have stopped his lisinopril and Norvasc due to soft blood pressure.  I have also started him on small dose of Coreg new onset atrial fibrillation.   4. I have also changed baclofen to as needed 3 times daily.  Home Health none Equipment/Devices: None Discharge Condition: Stable and improved CODE STATUS full code Diet recommendation: Cardiac Brief/Interim Summary: HPI per Dr. Myna Hidalgo  On 09/27/2019.  Theus Simbeck is a 86 y.o. male with medical history significant for CVA, hypertension, and carotid stenosis, now presenting to [facility] emergency department for evaluation of abdominal pain, nausea, vomiting, and diarrhea.  Patient is very pleasant but gives obviously incorrect answers to basic questions and no family is available at time of admission, making [facility] history difficult to obtain.  Patient has reportedly been experiencing abdominal pain with nausea, vomiting, and diarrhea for 1 week.  He actually denies diarrhea despite having just had multiple liquid stools in [facility] ED.  ED Course: Upon arrival to [facility] ED, patient is found to be afebrile, saturating low 90s on room air, and with normal BP, HR, and RR.  EKG features irregular rhythm with LAD and RBBB.  Chest x-ray with cardiomegaly and bibasilar atelectasis or infiltrate, possible right pleural effusion, and question of possible subdiaphragmatic gas.  CT [facility] abdomen and pelvis is notable for esophageal wall thickening and diarrheal state.  Chemistry panel with slight hyponatremia and BUN 33, higher than priors.  CBC features a leukocytosis to 13,800.  Covid and influenza PCR are negative.  Patient was given 500 cc of  IV fluids, Zofran, Rocephin, and azithromycin in [facility] ED. Discharge Diagnoses:  Principal Problem:   Diarrhea Active Problems:   Hemiplegia, late effect of cerebrovascular disease ([place])   Hypertension   Hyponatremia  #1 possible viral gastroenteritis-patient was treated with IV fluids and Zofran.  Since admission to [facility] hospital he has had no vomiting or diarrhea.  Stool samples were not obtained as he did not have any further bowel movements.  #2 history of hypertension-his blood pressure was soft throughout [facility] hospital stay.  I have stopped his Norvasc and lisinopril upon discharge.    #3 history of stroke stable continue Plavix  #4 new onset atrial fibrillation TSH normal.  His CHA2DS2-VASc score is 6.  Started him on Eliquis 5 mg twice a day.  Discussed with his daughter.  Started him on small dose of Coreg 3.25 twice a day.  Follow-up with cardiology after discharge.  Discharge Instructions  Discharge Instructions    Diet - low sodium heart healthy   Complete by: As directed    Increase activity slowly   Complete by: As directed      Allergies as of 09/28/2019   No Known Allergies     Medication List    STOP taking these medications   lisinopril 10 MG tablet Commonly known as: ZESTRIL   SENNOSIDES-DOCUSATE SODIUM PO   tiZANidine 2 MG tablet Commonly known as: ZANAFLEX     TAKE these medications   amLODipine 2.5 MG tablet Commonly known as: NORVASC Take 1 tablet (2.5 mg total) by mouth daily. What changed:   medication strength  how much to take   aspirin 81 MG  tablet Take 81 mg by mouth every morning.   baclofen 10 MG tablet Commonly known as: LIORESAL Take 1 tablet (10 mg total) by mouth 3 (three) times daily as needed for muscle spasms. What changed:   when to take this  reasons to take this   clopidogrel 75 MG tablet Commonly known as: PLAVIX TAKE 1 TABLET BY MOUTH ONCE DAILY IN [facility] MORNING What changed: See Richard new instructions.    multivitamin with minerals Tabs tablet Take 1 tablet by mouth every morning. Reported on 09/06/2015   ondansetron 4 MG tablet Commonly known as: ZOFRAN Take 1 tablet (4 mg total) by mouth every 6 (six) hours as needed for nausea.   saccharomyces boulardii 250 MG capsule Commonly known as: FLORASTOR Take 1 capsule (250 mg total) by mouth 2 (two) times daily.   simvastatin 20 MG tablet Commonly known as: ZOCOR TAKE 1 TABLET BY MOUTH ONCE DAILY IN [facility] EVENING What changed: when to take this   VITAMIN C PO Take by mouth.   VITAMIN D PO Take by mouth.      Follow-up Information    Denita Lung, MD Follow up.   Specialty: Family Medicine Contact information: [place] [phone]          No Known Allergies  Consultations: None Procedures/Studies: CT ABDOMEN PELVIS W CONTRAST  Result Date: 09/27/2019 CLINICAL DATA:  86 year old male with nausea vomiting. EXAM: CT ABDOMEN AND PELVIS WITH CONTRAST TECHNIQUE: Multidetector CT imaging of [facility] abdomen and pelvis was performed using Richard standard protocol following bolus administration of intravenous contrast. CONTRAST:  142mL OMNIPAQUE IOHEXOL 300 MG/ML  SOLN COMPARISON:  CT abdomen pelvis dated 06/04/2010. FINDINGS: Lower chest: There are bibasilar bronchiectasis with probable mucous impaction in Richard left lung base. Coronary vascular calcification. No intra-abdominal free air or free fluid. Hepatobiliary: No focal liver abnormality is seen. No gallstones, gallbladder wall thickening, or biliary dilatation. Pancreas: Unremarkable. No pancreatic ductal dilatation or surrounding inflammatory changes. Spleen: Normal in size without focal abnormality. Adrenals/Urinary Tract: Richard adrenal glands are unremarkable. There is no hydronephrosis on either side. There is symmetric enhancement and excretion of contrast by both kidneys. Richard visualized ureters and urinary bladder appear unremarkable.  Stomach/Bowel: There is diffuse circumferential thickening of Richard visualized distal esophagus concerning for esophagitis. Clinical correlation is recommended. Small amount of fluid within Richard distal esophagus may represent reflux or delayed clearance. There is loose stool throughout Richard colon compatible with diarrheal state. There are several scattered colonic diverticula without active inflammatory changes. Normal caliber fluid-filled loops of small bowel likely physiologic. Enteritis is not excluded. There is no bowel obstruction. Appendectomy. Vascular/Lymphatic: Advanced aortoiliac atherosclerotic disease. Richard IVC is unremarkable. No portal venous gas. There is no adenopathy. Reproductive: Richard prostate and seminal vesicles are grossly unremarkable. Other: None Musculoskeletal: Osteopenia with degenerative changes of Richard spine. Total right hip arthroplasty. No acute osseous pathology. IMPRESSION: 1. Findings concerning for esophagitis. Clinical correlation is recommended. 2. Diarrheal state. Correlation with clinical exam and stool cultures recommended. No bowel obstruction. 3. Colonic diverticulosis. 4. Aortic Atherosclerosis (ICD10-I70.0). Electronically Signed   By: Anner Crete M.D.   On: 09/27/2019 00:59   DG Chest Portable 1 View  Result Date: 09/26/2019 CLINICAL DATA:  Vomiting EXAM: PORTABLE CHEST 1 VIEW COMPARISON:  01/25/2012 FINDINGS: Cardiomegaly with aortic atherosclerosis. Streaky basilar opacities. No pneumothorax. Possible right pleural effusion. IMPRESSION: 1. Cardiomegaly with hazy basilar atelectasis or infiltrate 2. Possible right pleural effusion. Bandlike opacity at Richard right lung  base with lucency inferior to this, likely reflects atelectasis and aerated lung, however suggest decubitus view or dedicated upright abdominal radiograph to exclude loculated subdiaphragmatic gas collection. Electronically Signed   By: Donavan Foil M.D.   On: 09/26/2019 23:22    (Echo, Carotid, EGD,  Colonoscopy, ERCP)    Subjective: Patient resting in bed in no acute distress anxious to go home no nausea vomiting abdominal pain no diarrhea reported no chest pain shortness of breath cough or palpitations reported Discharge Exam: Vitals:   09/27/19 2135 09/28/19 0629  BP: 139/69 (!) 133/57  Pulse: 85 70  Resp: 18 20  Temp: 98.2 F (36.8 C) 97.7 F (36.5 C)  SpO2: 94% 95%   Vitals:   09/27/19 0439 09/27/19 1000 09/27/19 2135 09/28/19 0629  BP: 108/71 (!) 131/57 139/69 (!) 133/57  Pulse: 83 84 85 70  Resp: 18 18 18 20   Temp: 98.9 F (37.2 C) 98.7 F (37.1 C) 98.2 F (36.8 C) 97.7 F (36.5 C)  TempSrc: Oral Oral Oral Oral  SpO2: 94% 94% 94% 95%  Weight:   83.9 kg   Height:        General: Pt is alert, awake, not in acute distress Cardiovascular: Irregularly irregular.  S1/S2 +, no rubs, no gallops Respiratory: CTA bilaterally, no wheezing, no rhonchi Abdominal: Soft, NT, ND, bowel sounds + Extremities: no edema, no cyanosis    Richard results of significant diagnostics from this hospitalization (including imaging, microbiology, ancillary and laboratory) are listed below for reference.     Microbiology: Recent Results (from [facility] past 240 hour(s))  Respiratory Panel by RT PCR (Flu A&B, Covid) - Nasopharyngeal Swab     Status: None   Collection Time: 09/27/19 12:53 AM   Specimen: Nasopharyngeal Swab  Result Value Ref Range Status   SARS Coronavirus 2 by RT PCR NEGATIVE NEGATIVE Final    Comment: (NOTE) SARS-CoV-2 target nucleic acids are NOT DETECTED. Richard SARS-CoV-2 RNA is generally detectable in upper respiratoy specimens during Richard acute phase of infection. Richard lowest concentration of SARS-CoV-2 viral copies this assay can detect is 131 copies/mL. A negative result does not preclude SARS-Cov-2 infection and should not be used as Richard sole basis for treatment or other patient management decisions. A negative result may occur with  improper specimen  collection/handling, submission of specimen other than nasopharyngeal swab, presence of viral mutation(s) within Richard areas targeted by this assay, and inadequate number of viral copies (<131 copies/mL). A negative result must be combined with clinical observations, patient history, and epidemiological information. Richard expected result is Negative. Fact Sheet for Patients:  PinkCheek.be Fact Sheet for Healthcare Providers:  GravelBags.it This test is not yet ap proved or cleared by [facility] [place] FDA and  has been authorized for detection and/or diagnosis of SARS-CoV-2 by FDA under an Emergency Use Authorization (EUA). This EUA will remain  in effect (meaning this test can be used) for Richard duration of Richard COVID-19 declaration under Section 564(b)(1) of Richard Act, 21 U.S.C. section 360bbb-3(b)(1), unless Richard authorization is terminated or revoked sooner.    Influenza A by PCR NEGATIVE NEGATIVE Final   Influenza B by PCR NEGATIVE NEGATIVE Final    Comment: (NOTE) Richard Xpert Xpress SARS-CoV-2/FLU/RSV assay is intended as an aid in  Richard diagnosis of influenza from Nasopharyngeal swab specimens and  should not be used as a sole basis for treatment. Nasal washings and  aspirates are unacceptable for Xpert Xpress SARS-CoV-2/FLU/RSV  testing. Fact Sheet for Patients: PinkCheek.be Fact Sheet  for Healthcare Providers: GravelBags.it This test is not yet approved or cleared by [facility] [place] and  has been authorized for detection and/or diagnosis of SARS-CoV-2 by  FDA under an Emergency Use Authorization (EUA). This EUA will remain  in effect (meaning this test can be used) for Richard duration of Richard  Covid-19 declaration under Section 564(b)(1) of Richard Act, 21  U.S.C. section 360bbb-3(b)(1), unless Richard authorization is  terminated or revoked. Performed at [facility], [place] [address], [place], [place] [address]      Labs: BNP (last 3 results) No results for input(s): BNP in Richard last 8760 hours. Basic Metabolic Panel: Recent Labs  Lab 09/26/19 1855 09/27/19 0514 09/28/19 0301  NA 133* 135 136  K 4.0 3.5 3.5  CL 97* 104 106  CO2 21* 19* 22  GLUCOSE 155* 114* 110*  BUN 33* 43* 23  CREATININE 1.15 1.06 0.95  CALCIUM 8.1* 7.7* 7.8*   Liver Function Tests: Recent Labs  Lab 09/26/19 1855  AST 26  ALT 21  ALKPHOS 44  BILITOT 0.9  PROT 6.6  ALBUMIN 3.6   Recent Labs  Lab 09/26/19 1855  LIPASE 29   No results for input(s): AMMONIA in Richard last 168 hours. CBC: Recent Labs  Lab 09/26/19 1855 09/27/19 0514 09/28/19 0301  WBC 13.8* 14.2* 10.5  HGB 15.1 13.2 12.4*  HCT 44.8 38.1* 35.1*  MCV 97.6 96.7 97.0  PLT 254 223 219   Cardiac Enzymes: No results for input(s): CKTOTAL, CKMB, CKMBINDEX, TROPONINI in Richard last 168 hours. BNP: Invalid input(s): POCBNP CBG: No results for input(s): GLUCAP in Richard last 168 hours. D-Dimer No results for input(s): DDIMER in Richard last 72 hours. Hgb A1c No results for input(s): HGBA1C in Richard last 72 hours. Lipid Profile No results for input(s): CHOL, HDL, LDLCALC, TRIG, CHOLHDL, LDLDIRECT in Richard last 72 hours. Thyroid function studies Recent Labs    09/27/19 0514  TSH 0.544   Anemia work up No results for input(s): VITAMINB12, FOLATE, FERRITIN, TIBC, IRON, RETICCTPCT in Richard last 72 hours. Urinalysis    Component Value Date/Time   COLORURINE YELLOW 09/28/2019 0643   APPEARANCEUR CLEAR 09/28/2019 0643   LABSPEC 1.013 09/28/2019 0643   PHURINE 5.0 09/28/2019 0643   GLUCOSEU NEGATIVE 09/28/2019 0643   HGBUR NEGATIVE 09/28/2019 0643   BILIRUBINUR NEGATIVE 09/28/2019 0643   KETONESUR NEGATIVE 09/28/2019 0643   PROTEINUR NEGATIVE 09/28/2019 0643   UROBILINOGEN 1.0 01/13/2012 1638   NITRITE NEGATIVE 09/28/2019 0643   LEUKOCYTESUR NEGATIVE 09/28/2019 0643   Sepsis Labs Invalid input(s):  PROCALCITONIN,  WBC,  LACTICIDVEN Microbiology Recent Results (from Richard past 240 hour(s))  Respiratory Panel by RT PCR (Flu A&B, Covid) - Nasopharyngeal Swab     Status: None   Collection Time: 09/27/19 12:53 AM   Specimen: Nasopharyngeal Swab  Result Value Ref Range Status   SARS Coronavirus 2 by RT PCR NEGATIVE NEGATIVE Final    Comment: (NOTE) SARS-CoV-2 target nucleic acids are NOT DETECTED. Richard SARS-CoV-2 RNA is generally detectable in upper respiratoy specimens during Richard acute phase of infection. Richard lowest concentration of SARS-CoV-2 viral copies this assay can detect is 131 copies/mL. A negative result does not preclude SARS-Cov-2 infection and should not be used as Richard sole basis for treatment or other patient management decisions. A negative result may occur with  improper specimen collection/handling, submission of specimen other than nasopharyngeal swab, presence of viral mutation(s) within Richard areas targeted by this assay, and inadequate number  of viral copies (<131 copies/mL). A negative result must be combined with clinical observations, patient history, and epidemiological information. Richard expected result is Negative. Fact Sheet for Patients:  PinkCheek.be Fact Sheet for Healthcare Providers:  GravelBags.it This test is not yet ap proved or cleared by [facility] [place] FDA and  has been authorized for detection and/or diagnosis of SARS-CoV-2 by FDA under an Emergency Use Authorization (EUA). This EUA will remain  in effect (meaning this test can be used) for Richard duration of Richard COVID-19 declaration under Section 564(b)(1) of Richard Act, 21 U.S.C. section 360bbb-3(b)(1), unless Richard authorization is terminated or revoked sooner.    Influenza A by PCR NEGATIVE NEGATIVE Final   Influenza B by PCR NEGATIVE NEGATIVE Final    Comment: (NOTE) Richard Xpert Xpress SARS-CoV-2/FLU/RSV assay is intended as an aid in  Richard  diagnosis of influenza from Nasopharyngeal swab specimens and  should not be used as a sole basis for treatment. Nasal washings and  aspirates are unacceptable for Xpert Xpress SARS-CoV-2/FLU/RSV  testing. Fact Sheet for Patients: PinkCheek.be Fact Sheet for Healthcare Providers: GravelBags.it This test is not yet approved or cleared by [facility] [place] FDA and  has been authorized for detection and/or diagnosis of SARS-CoV-2 by  FDA under an Emergency Use Authorization (EUA). This EUA will remain  in effect (meaning this test can be used) for Richard duration of Richard  Covid-19 declaration under Section 564(b)(1) of Richard Act, 21  U.S.C. section 360bbb-3(b)(1), unless Richard authorization is  terminated or revoked. Performed at [facility], [place] [address], [place], [place] [address]      Time coordinating discharge:  39 minutes  SIGNED:   Georgette Shell, MD  [facility] 09/28/2019, 9:10 AM Pager   If 7PM-7AM, please contact night-coverage [email or web address] Password [email or web address]

## 2019-09-28 NOTE — Progress Notes (Signed)
Physical Therapy Evaluation Patient Details Name: [facility] MRN: [id number] DOB: December 04, 1928 Today's Date: 09/28/2019   History of Present Illness  Pt adm with nausea, vomiting, and diarrhea. PMH - CVA with rt hemiparesis, HTN, rt THR.  Clinical Impression  Pt with decr mobility due to decr activity over last few days as well as baseline deficits from old CVA. Expect pt will do better in his home environment which is set up for his needs. Spoke with daughter/wife via phone and wife reports some recent slow down on functional activities. Recommend HHPT.     Follow Up Recommendations Home health PT;Supervision/Assistance - 24 hour    Equipment Recommendations  None recommended by PT    Recommendations for Other Services       Precautions / Restrictions Precautions Precautions: Fall Required Braces or Orthoses: Other Brace Other Brace: rt AFO Restrictions Weight Bearing Restrictions: No      Mobility  Bed Mobility Overal bed mobility: Needs Assistance Bed Mobility: Supine to Sit;Sit to Supine     Supine to sit: Min assist Sit to supine: Supervision   General bed mobility comments: Assist to elevate trunk into sitting and bring hips to EOB  Transfers Overall transfer level: Needs assistance Equipment used: Rolling walker (2 wheeled) Transfers: Sit to/from Stand Sit to Stand: +2 physical assistance;Max assist         General transfer comment: Assist to bring hips up and for balance. Pt with heavy posterior bias and rt foot sliding forward. Used walker in lt hand since no cane present  Ambulation/Gait                Stairs            Wheelchair Mobility    Modified Rankin (Stroke Patients Only)       Balance Overall balance assessment: Needs assistance Sitting-balance support: No upper extremity supported;Feet supported Sitting balance-Leahy Scale: Fair     Standing balance support: Bilateral upper extremity supported Standing balance-Leahy  Scale: Poor Standing balance comment: Once up in standing +2 min assist to maintain.                             Pertinent Vitals/Pain Pain Assessment: Faces Faces Pain Scale: Hurts little more Pain Location: rt foot putting shoes on  Pain Descriptors / Indicators: Grimacing Pain Intervention(s): Limited activity within patient's tolerance;Monitored during session    Home Living Family/patient expects to be discharged to:: Private residence Living Arrangements: Spouse/significant other Available Help at Discharge: Family;Available 24 hours/day           Home Equipment: Cane - quad;Tub bench;Wheelchair - manual      Prior Function Level of Independence: Needs assistance   Gait / Transfers Assistance Needed: amb short distances with cane and assist of wife           Hand Dominance        Extremity/Trunk Assessment   Upper Extremity Assessment Upper Extremity Assessment: Generalized weakness;RUE deficits/detail RUE Deficits / Details: decr function due to old CVA    Lower Extremity Assessment Lower Extremity Assessment: Generalized weakness;RLE deficits/detail RLE Deficits / Details: decr function due to old CVA       Communication   Communication: No difficulties  Cognition Arousal/Alertness: Awake/alert Behavior During Therapy: WFL for tasks assessed/performed Overall Cognitive Status: No family/caregiver present to determine baseline cognitive functioning Area of Impairment: Memory;Orientation  Orientation Level: Disoriented to;Place;Time;Situation   Memory: Decreased recall of precautions;Decreased short-term memory         General Comments: Cognition likely baseline      General Comments      Exercises     Assessment/Plan    PT Assessment Patient needs continued PT services  PT Problem List Decreased strength;Decreased balance;Decreased activity tolerance;Decreased mobility       PT Treatment  Interventions DME instruction;Gait training;Functional mobility training;Therapeutic activities;Therapeutic exercise;Balance training;Patient/family education    PT Goals (Current goals can be found in the Care Plan section)  Acute Rehab PT Goals Patient Stated Goal: go home PT Goal Formulation: With patient Time For Goal Achievement: 10/05/19 Potential to Achieve Goals: Good    Frequency Min 3X/week   Barriers to discharge        Co-evaluation               AM-PAC PT "6 Clicks" Mobility  Outcome Measure Help needed turning from your back to your side while in a flat bed without using bedrails?: A Little Help needed moving from lying on your back to sitting on the side of a flat bed without using bedrails?: A Little Help needed moving to and from a bed to a chair (including a wheelchair)?: Total Help needed standing up from a chair using your arms (e.g., wheelchair or bedside chair)?: Total Help needed to walk in hospital room?: Total Help needed climbing 3-5 steps with a railing? : Total 6 Click Score: 10    End of Session Equipment Utilized During Treatment: Gait belt Activity Tolerance: Patient tolerated treatment well Patient left: in bed;with call bell/phone within reach;with bed alarm set Nurse Communication: Mobility status PT Visit Diagnosis: Other abnormalities of gait and mobility (R26.89);Hemiplegia and hemiparesis Hemiplegia - Right/Left: Right Hemiplegia - dominant/non-dominant: Dominant Hemiplegia - caused by: Cerebral infarction    Time: 1001-1017 PT Time Calculation (min) (ACUTE ONLY): 16 min   Charges:   PT Evaluation $PT Eval Moderate Complexity: [place] Pager [phone] Office [place] 09/28/2019, 1:11 PM

## 2019-09-29 ENCOUNTER — Telehealth: Payer: Self-pay

## 2019-09-29 LAB — OCCULT BLOOD, POC DEVICE: Fecal Occult Bld: POSITIVE — AB

## 2019-09-29 NOTE — Telephone Encounter (Signed)
It is a probiotic so when it runs out she can switch to any probiotic over-the-counter but it is not necessary.

## 2019-09-29 NOTE — Telephone Encounter (Signed)
Pt wife would like to know If husband should still be taking florastor. Please advise if this is ok and I a script can be given for savings. [place]

## 2019-09-30 NOTE — Telephone Encounter (Signed)
Pt wife was advised. Kh

## 2019-10-02 DIAGNOSIS — I4891 Unspecified atrial fibrillation: Secondary | ICD-10-CM | POA: Diagnosis not present

## 2019-10-02 DIAGNOSIS — I69351 Hemiplegia and hemiparesis following cerebral infarction affecting right dominant side: Secondary | ICD-10-CM | POA: Diagnosis not present

## 2019-10-02 DIAGNOSIS — I10 Essential (primary) hypertension: Secondary | ICD-10-CM | POA: Diagnosis not present

## 2019-10-02 DIAGNOSIS — E785 Hyperlipidemia, unspecified: Secondary | ICD-10-CM | POA: Diagnosis not present

## 2019-10-03 ENCOUNTER — Telehealth: Payer: Self-pay

## 2019-10-03 NOTE — Telephone Encounter (Signed)
Mickel Baas from [facility] called to advise pt was on three blood thinners. Per Dr. Redmond School pt is to stop taking Plavix . She also requested OT evaluation and PT or 2 times a week for 6 weeks. Please advise [facility]

## 2019-10-03 NOTE — Telephone Encounter (Signed)
[facility] was advised. [place]

## 2019-10-03 NOTE — Telephone Encounter (Signed)
ok 

## 2019-10-06 DIAGNOSIS — E785 Hyperlipidemia, unspecified: Secondary | ICD-10-CM | POA: Diagnosis not present

## 2019-10-06 DIAGNOSIS — I69351 Hemiplegia and hemiparesis following cerebral infarction affecting right dominant side: Secondary | ICD-10-CM | POA: Diagnosis not present

## 2019-10-06 DIAGNOSIS — I4891 Unspecified atrial fibrillation: Secondary | ICD-10-CM | POA: Diagnosis not present

## 2019-10-06 DIAGNOSIS — I10 Essential (primary) hypertension: Secondary | ICD-10-CM | POA: Diagnosis not present

## 2019-10-07 DIAGNOSIS — I69351 Hemiplegia and hemiparesis following cerebral infarction affecting right dominant side: Secondary | ICD-10-CM | POA: Diagnosis not present

## 2019-10-07 DIAGNOSIS — E785 Hyperlipidemia, unspecified: Secondary | ICD-10-CM | POA: Diagnosis not present

## 2019-10-07 DIAGNOSIS — I4891 Unspecified atrial fibrillation: Secondary | ICD-10-CM | POA: Diagnosis not present

## 2019-10-07 DIAGNOSIS — I10 Essential (primary) hypertension: Secondary | ICD-10-CM | POA: Diagnosis not present

## 2019-10-08 DIAGNOSIS — I69351 Hemiplegia and hemiparesis following cerebral infarction affecting right dominant side: Secondary | ICD-10-CM | POA: Diagnosis not present

## 2019-10-08 DIAGNOSIS — I10 Essential (primary) hypertension: Secondary | ICD-10-CM | POA: Diagnosis not present

## 2019-10-08 DIAGNOSIS — E785 Hyperlipidemia, unspecified: Secondary | ICD-10-CM | POA: Diagnosis not present

## 2019-10-08 DIAGNOSIS — I4891 Unspecified atrial fibrillation: Secondary | ICD-10-CM | POA: Diagnosis not present

## 2019-10-09 DIAGNOSIS — I4891 Unspecified atrial fibrillation: Secondary | ICD-10-CM | POA: Diagnosis not present

## 2019-10-09 DIAGNOSIS — I10 Essential (primary) hypertension: Secondary | ICD-10-CM | POA: Diagnosis not present

## 2019-10-09 DIAGNOSIS — E785 Hyperlipidemia, unspecified: Secondary | ICD-10-CM | POA: Diagnosis not present

## 2019-10-09 DIAGNOSIS — I69351 Hemiplegia and hemiparesis following cerebral infarction affecting right dominant side: Secondary | ICD-10-CM | POA: Diagnosis not present

## 2019-10-13 DIAGNOSIS — E785 Hyperlipidemia, unspecified: Secondary | ICD-10-CM | POA: Diagnosis not present

## 2019-10-13 DIAGNOSIS — I69351 Hemiplegia and hemiparesis following cerebral infarction affecting right dominant side: Secondary | ICD-10-CM | POA: Diagnosis not present

## 2019-10-13 DIAGNOSIS — I10 Essential (primary) hypertension: Secondary | ICD-10-CM | POA: Diagnosis not present

## 2019-10-13 DIAGNOSIS — I4891 Unspecified atrial fibrillation: Secondary | ICD-10-CM | POA: Diagnosis not present

## 2019-10-14 DIAGNOSIS — I4891 Unspecified atrial fibrillation: Secondary | ICD-10-CM | POA: Diagnosis not present

## 2019-10-14 DIAGNOSIS — I69351 Hemiplegia and hemiparesis following cerebral infarction affecting right dominant side: Secondary | ICD-10-CM | POA: Diagnosis not present

## 2019-10-14 DIAGNOSIS — E785 Hyperlipidemia, unspecified: Secondary | ICD-10-CM | POA: Diagnosis not present

## 2019-10-14 DIAGNOSIS — I10 Essential (primary) hypertension: Secondary | ICD-10-CM | POA: Diagnosis not present

## 2019-10-15 DIAGNOSIS — I4891 Unspecified atrial fibrillation: Secondary | ICD-10-CM | POA: Diagnosis not present

## 2019-10-15 DIAGNOSIS — E785 Hyperlipidemia, unspecified: Secondary | ICD-10-CM | POA: Diagnosis not present

## 2019-10-15 DIAGNOSIS — I69351 Hemiplegia and hemiparesis following cerebral infarction affecting right dominant side: Secondary | ICD-10-CM | POA: Diagnosis not present

## 2019-10-15 DIAGNOSIS — I10 Essential (primary) hypertension: Secondary | ICD-10-CM | POA: Diagnosis not present

## 2019-10-17 NOTE — Progress Notes (Signed)
Date:  10/22/2019   ID:  Richard Kent, DOB 1928-10-24, MRN [id number]  PCP:  Denita Lung, MD  Cardiologist:  Rex Kras, DO, [facility] (established care 11/18/2019) Former Cardiology Providers:  Sueanne Margarita, MD and Burnell Blanks, MD  REASON FOR CONSULT: Atrial fibrillation  REQUESTING PHYSICIAN:  Denita Lung, MD [address] [place],  [place] [address]  Chief Complaint  Patient presents with  . Atrial Fibrillation  . New Patient (Initial Visit)    HPI  Richard Kent is a 86 y.o. male who is being seen today for the evaluation of atrial fibrillation.  Patient and his wife do not know who has referred them to our practice and why but review of EMR illustrates that he was recently diagnosed atrial fibrillation.  We will send a copy of the consultation to his primary care provider for continuation of care. Patient's past medical history and cardiac risk factors include: History of stroke in 2001, bilateral carotid artery disease, hypertension, hyperlipidemia, right bundle branch block, GERD, recently diagnosed with atrial fibrillation, advanced age.  Richard Kent is accompanied by his wife at today's office visit.  Patient states that he was hospitalized in May 2021 due to GI symptoms during which he had an EKG performed at the hospital.  EKG shows atrial fibrillation and patient underwent an echocardiogram prior to discharge.  Letter below for further reference.  [name] states that physical therapist was concerned that the patient was on triple therapy and therefore recommended patient follows up with cardiology.  It appears that patient was on DAPT since his stroke in 2001.  He has not plavix already.   Since patient was discharged from the hospital he has been on Eliquis 5 mg p.o. twice daily for thromboembolic prophylaxis.  No prior history of intracranial bleeding or internal bleeding.  No recent surgeries.  Currently patient denies any chest pain or  anginal equivalent.  History of stroke in 2001. Denies prior history of coronary artery disease, myocardial infarction, congestive heart failure, deep venous thrombosis, pulmonary embolism,  transient ischemic attack.  FUNCTIONAL STATUS: No structured exercise or daily routine. He ambulates with cane and walker while at home. Functional status is limited due to prior stroke with right sided residual deficits.   ALLERGIES: No Known Allergies  MEDICATION LIST PRIOR TO VISIT: Current Meds  Medication Sig  . amLODipine (NORVASC) 2.5 MG tablet Take 2.5 mg by mouth daily.  Marland Kitchen apixaban (ELIQUIS) 5 MG TABS tablet Take 1 tablet (5 mg total) by mouth 2 (two) times daily.  . Ascorbic Acid (VITAMIN C PO) Take by mouth.  Marland Kitchen aspirin 81 MG tablet Take 81 mg by mouth every morning.   . baclofen (LIORESAL) 10 MG tablet Take 1 tablet (10 mg total) by mouth 3 (three) times daily as needed for muscle spasms.  . Multiple Vitamin (MULTIVITAMIN WITH MINERALS) TABS Take 1 tablet by mouth every morning. Reported on 09/06/2015  . ondansetron (ZOFRAN) 4 MG tablet Take 1 tablet (4 mg total) by mouth every 6 (six) hours as needed for nausea.  Marland Kitchen saccharomyces boulardii (FLORASTOR) 250 MG capsule Take 1 capsule (250 mg total) by mouth 2 (two) times daily.  . simvastatin (ZOCOR) 20 MG tablet TAKE 1 TABLET BY MOUTH ONCE DAILY IN THE EVENING (Patient taking differently: Take 20 mg by mouth daily at 6 PM. )   Current Facility-Administered Medications for the 10/22/19 encounter (Office Visit) with Rex Kras, DO  Medication  . botulinum toxin Type A (BOTOX)  injection 300 Units     PAST MEDICAL HISTORY: Past Medical History:  Diagnosis Date  . Carotid stenosis   . CVA (cerebral infarction) 2011  . Diverticulosis   . Hyperlipidemia   . Hypertension   . Kidney stone   . Smoker   . Stroke [facility]) 11/17/09    PAST SURGICAL HISTORY: Past Surgical History:  Procedure Laterality Date  . APPENDECTOMY    . HERNIA REPAIR       RIGHT  . HIP ARTHROPLASTY  01/15/2012   Procedure: ARTHROPLASTY BIPOLAR HIP;  Surgeon: Rozanna Box, MD;  Location: [place];  Service: Orthopedics;  Laterality: Right;    FAMILY HISTORY: The patient family history includes Cancer in his mother; Cervical cancer in his sister; Heart attack in his sister; Heart disease in his father; Stroke in his mother.  SOCIAL HISTORY:  The patient  reports that he quit smoking about 47 years ago. His smoking use included cigarettes. He has a 40.00 pack-year smoking history. He has never used smokeless tobacco. He reports that he does not drink alcohol or use drugs.  REVIEW OF SYSTEMS: Review of Systems  Constitution: Negative for chills, diaphoresis and fever.  HENT: Negative for hoarse voice and nosebleeds.   Eyes: Negative for discharge, double vision and pain.  Cardiovascular: Negative for chest pain, claudication, dyspnea on exertion, leg swelling, near-syncope, orthopnea, palpitations, paroxysmal nocturnal dyspnea and syncope.  Respiratory: Negative for hemoptysis and shortness of breath.   Musculoskeletal: Negative for muscle cramps and myalgias.  Gastrointestinal: Negative for abdominal pain, constipation, diarrhea, hematemesis, hematochezia, melena, nausea and vomiting.  Neurological: Negative for dizziness and light-headedness.    PHYSICAL EXAM: Vitals with BMI 10/22/2019 09/28/2019 09/28/2019  Height 6\' 0"  - -  Weight 166 lbs - -  BMI [id number] - -  Systolic [id number]  Diastolic 70 79 57  Pulse 82 89 70    CONSTITUTIONAL: Appears older than stated age, hemodynamically stable, no acute distress.  Sitting upright in his wheelchair.  SKIN: Skin is warm and dry. No rash noted. No cyanosis. No pallor. No jaundice HEAD: Normocephalic and atraumatic.  EYES: No scleral icterus MOUTH/THROAT: Moist oral membranes.  NECK: No JVD present. No thyromegaly noted.  Right-sided carotid bruit LYMPHATIC: No visible cervical adenopathy.  CHEST Normal  respiratory effort. No intercostal retractions  LUNGS: Clear to auscultation bilaterally.  No stridor. No wheezes. No rales.  CARDIOVASCULAR: Irregularly irregular, positive S1-S2, no murmurs rubs or gallops appreciated ABDOMINAL: Soft, nontender, nondistended positive bowel sounds in all 4 quadrants, no apparent ascites.  EXTREMITIES: No peripheral edema.  Brace in the right lower extremity.  HEMATOLOGIC: No significant bruising NEUROLOGIC: Oriented to person, place, and time. Nonfocal. Normal muscle tone.  Decrease strength in right upper and lower extremity.  PSYCHIATRIC: Normal mood and affect. Normal behavior. Cooperative  RADIOLOGY: MR Head: 06/12/2010: No antegrade flow is seen in the left ICA.  The right ICA supplies the right middle cerebral artery territory and both anterior cerebral artery territories due to a patent anterior communicating artery. There is a mild stenosis at the anterior cerebral artery origin. There is reconstituted flow in the left middle cerebral artery territory, poorly demonstrated by MRA but better shown on CTA. Distal left vertebral artery narrowing estimated at 50%.  This is the nondominant vertebral.  CTA head and neck:  05/2010: Complete occlusion left internal carotid artery. 50% stenosis right internal carotid artery with calcific plaque. 75 % stenosis on the left vertebral origin.  CARDIAC DATABASE: EKG:  10/22/2019: Atrial fibrillation, 84 bpm, left axis deviation, right bundle branch block, no underlying injury pattern.  Echocardiogram: 09/28/2019: LVEF 60 to 65%, no regional wall motion abnormalities, mild LVH, indeterminate diastolic parameters, mildly dilated left atrium, no significant valvular heart disease.  Stress Testing: More than 5 years ago.   Heart Catheterization: None  Carotid duplex: 12/2016: Findings consistent with 60-79% stenosis by velocity involving the right internal carotid artery. Velocities may be falsely elevated due to  compensatory flow. Findings consistent with greater than 80% stenosis involving the left internal carotid artery.  Bilateral vertebral artery flow is antegrade.   LABORATORY DATA: CBC Latest Ref Rng & Units 09/28/2019 09/27/2019 09/26/2019  WBC 4.0 - 10.5 K/uL 10.5 14.2(H) 13.8(H)  Hemoglobin 13.0 - 17.0 g/dL 12.4(L) 13.2 15.1  Hematocrit 39.0 - 52.0 % 35.1(L) 38.1(L) 44.8  Platelets 150 - 400 K/uL 219 223 254    CMP Latest Ref Rng & Units 09/28/2019 09/27/2019 09/26/2019  Glucose 70 - 99 mg/dL 110(H) 114(H) 155(H)  BUN 8 - 23 mg/dL 23 43(H) 33(H)  Creatinine 0.61 - 1.24 mg/dL 0.95 1.06 1.15  Sodium 135 - 145 mmol/L 136 135 133(L)  Potassium 3.5 - 5.1 mmol/L 3.5 3.5 4.0  Chloride 98 - 111 mmol/L 106 104 97(L)  CO2 22 - 32 mmol/L 22 19(L) 21(L)  Calcium 8.9 - 10.3 mg/dL 7.8(L) 7.7(L) 8.1(L)  Total Protein 6.5 - 8.1 g/dL - - 6.6  Total Bilirubin 0.3 - 1.2 mg/dL - - 0.9  Alkaline Phos 38 - 126 U/L - - 44  AST 15 - 41 U/L - - 26  ALT 0 - 44 U/L - - 21    Lipid Panel     Component Value Date/Time   CHOL 134 06/03/2019 1348   TRIG 113 06/03/2019 1348   HDL 44 06/03/2019 1348   CHOLHDL 3.0 06/03/2019 1348   CHOLHDL 2.8 01/09/2017 1004   VLDL 15 01/09/2017 1004   LDLCALC 69 06/03/2019 1348   LABVLDL 21 06/03/2019 1348   No components found for: NTPROBNP Lab Results  Component Value Date   TSH 0.544 09/27/2019   IMPRESSION:    ICD-10-CM   1. Persistent atrial fibrillation (HCC)  I48.19 EKG 12-Lead    metoprolol succinate (TOPROL XL) 25 MG 24 hr tablet    PCV MYOCARDIAL PERFUSION WITH LEXISCAN    EXTERNAL ECG MONITOR (UP TO 30 DAYS) ROCT  2. Carotid stenosis, bilateral  I65.23   3. Essential hypertension  I10   4. Mixed hyperlipidemia  E78.2   5. RBBB  I45.10   6. Former smoker  Z87.891      RECOMMENDATIONS: Joeb Madole is a 86 y.o. male whose past medical history and cardiac risk factors include: History of stroke in 2001, bilateral carotid artery disease, hypertension,  hyperlipidemia, right bundle branch block, GERD, recently diagnosed with atrial fibrillation, advanced age.  Persistent atrial fibrillation  Patient was diagnosed with atrial fibrillation while he was hospitalized in May 2021.  At the time of discharge he was started on oral anticoagulation with Eliquis 5 mg p.o. twice daily.  Since initiation of oral anticoagulation he has not noticed any evidence of bleeding.  I had a long and detailed discussion with the patient and his wife regarding the incidence, etiology, pathophysiology, prognosis, and therapeutic options for atrial fibrillation.  Specifically we discussed oral anticoagulation for stroke prevention.  CHA2DS2-VASc SCORE is 6 which correlates to 9.8% risk of stroke per year.  I reviewed with patient and wife the benefits and  risks and alternatives of continuing anticoagulation rx.  They verbalized understanding provided verbal feedback.  Rate control: Initiate Toprol-XL 25 mg p.o. daily.  Medication profile discussed with the patient and his wife.  Rhythm control: N/A.  Thromboembolic prophylaxis: Eliquis 5 mg p.o. twice daily.   14-day mobile cardiac ambulatory telemetry to evaluate atrial fibrillation burden.  Given the new onset of atrial fibrillation discussed undergoing stress test to evaluate for reversible ischemia.   Bilateral carotid artery stenosis:  Since patient establishing care with myself the first time I was reviewing his diagnostic studies have already been performed.  He is noted to have occlusion of the left internal carotid artery.  And based on carotid duplex from August 2018 he is noted to have  60-79% stenosis of the right ICA based on peak systolic velocities.  I am not sure if he is being currently followed by either vascular surgery and /or neurology given his history of stroke at the current time.  I have asked the patient's wife to discuss this further with his primary care provider at the upcoming office  visit.   Currently on aspirin and statin therapy.   Patient is asked to bring in a copy of his most recent lipid profile at next office visit for review.  Former smoker: Educated on the importance of continued smoking cessation.  FINAL MEDICATION LIST END OF ENCOUNTER: Meds ordered this encounter  Medications  . metoprolol succinate (TOPROL XL) 25 MG 24 hr tablet    Sig: Take 1 tablet (25 mg total) by mouth in the morning. Hold if systolic blood pressure (top blood pressure number) less than 100 mmHg or heart rate less than 60 bpm (pulse).    Dispense:  30 tablet    Refill:  0    Medications Discontinued During This Encounter  Medication Reason  . clopidogrel (PLAVIX) 75 MG tablet Patient Preference  . carvedilol (COREG) 3.125 MG tablet Patient Preference     Current Outpatient Medications:  .  amLODipine (NORVASC) 2.5 MG tablet, Take 2.5 mg by mouth daily., Disp: , Rfl:  .  apixaban (ELIQUIS) 5 MG TABS tablet, Take 1 tablet (5 mg total) by mouth 2 (two) times daily., Disp: 60 tablet, Rfl: 2 .  Ascorbic Acid (VITAMIN C PO), Take by mouth., Disp: , Rfl:  .  aspirin 81 MG tablet, Take 81 mg by mouth every morning. , Disp: , Rfl:  .  baclofen (LIORESAL) 10 MG tablet, Take 1 tablet (10 mg total) by mouth 3 (three) times daily as needed for muscle spasms., Disp: 270 tablet, Rfl: 3 .  Multiple Vitamin (MULTIVITAMIN WITH MINERALS) TABS, Take 1 tablet by mouth every morning. Reported on 09/06/2015, Disp: , Rfl:  .  ondansetron (ZOFRAN) 4 MG tablet, Take 1 tablet (4 mg total) by mouth every 6 (six) hours as needed for nausea., Disp: 20 tablet, Rfl: 0 .  saccharomyces boulardii (FLORASTOR) 250 MG capsule, Take 1 capsule (250 mg total) by mouth 2 (two) times daily., Disp: 60 capsule, Rfl: 1 .  simvastatin (ZOCOR) 20 MG tablet, TAKE 1 TABLET BY MOUTH ONCE DAILY IN THE EVENING (Patient taking differently: Take 20 mg by mouth daily at 6 PM. ), Disp: 90 tablet, Rfl: 3 .  metoprolol succinate (TOPROL  XL) 25 MG 24 hr tablet, Take 1 tablet (25 mg total) by mouth in the morning. Hold if systolic blood pressure (top blood pressure number) less than 100 mmHg or heart rate less than 60 bpm (pulse)., Disp: 30 tablet,  Rfl: 0 .  VITAMIN D PO, Take by mouth., Disp: , Rfl:   Current Facility-Administered Medications:  .  botulinum toxin Type A (BOTOX) injection 300 Units, 300 Units, Intramuscular, Once, Marcial Pacas, MD  Orders Placed This Encounter  Procedures  . PCV MYOCARDIAL PERFUSION WITH LEXISCAN  . EXTERNAL ECG MONITOR (UP TO 30 DAYS) ROCT  . EKG 12-Lead    --Continue cardiac medications as reconciled in final medication list. --Return in about 4 weeks (around 11/19/2019) for afib follow up.. Or sooner if needed. --Continue follow-up with your primary care physician regarding the management of your other chronic comorbid conditions.  Patient's questions and concerns were addressed to his satisfaction. He voices understanding of the instructions provided during this encounter.   This note was created using a voice recognition software as a result there may be grammatical errors inadvertently enclosed that do not reflect the nature of this encounter. Every attempt is made to correct such errors.  Rex Kras, [place], [facility]  Pager: [phone] Office: [phone]

## 2019-10-21 DIAGNOSIS — I10 Essential (primary) hypertension: Secondary | ICD-10-CM | POA: Diagnosis not present

## 2019-10-21 DIAGNOSIS — E785 Hyperlipidemia, unspecified: Secondary | ICD-10-CM | POA: Diagnosis not present

## 2019-10-21 DIAGNOSIS — I4891 Unspecified atrial fibrillation: Secondary | ICD-10-CM | POA: Diagnosis not present

## 2019-10-21 DIAGNOSIS — I69351 Hemiplegia and hemiparesis following cerebral infarction affecting right dominant side: Secondary | ICD-10-CM | POA: Diagnosis not present

## 2019-10-22 ENCOUNTER — Encounter: Payer: Self-pay | Admitting: Cardiology

## 2019-10-22 ENCOUNTER — Other Ambulatory Visit: Payer: Self-pay

## 2019-10-22 ENCOUNTER — Ambulatory Visit: Payer: Medicare Other | Admitting: Cardiology

## 2019-10-22 ENCOUNTER — Ambulatory Visit: Payer: Medicare Other

## 2019-10-22 VITALS — BP 140/70 | HR 82 | Ht 72.0 in | Wt 166.0 lb

## 2019-10-22 DIAGNOSIS — I6523 Occlusion and stenosis of bilateral carotid arteries: Secondary | ICD-10-CM

## 2019-10-22 DIAGNOSIS — I4510 Unspecified right bundle-branch block: Secondary | ICD-10-CM

## 2019-10-22 DIAGNOSIS — I4819 Other persistent atrial fibrillation: Secondary | ICD-10-CM

## 2019-10-22 DIAGNOSIS — Z7901 Long term (current) use of anticoagulants: Secondary | ICD-10-CM

## 2019-10-22 DIAGNOSIS — I4891 Unspecified atrial fibrillation: Secondary | ICD-10-CM | POA: Diagnosis not present

## 2019-10-22 DIAGNOSIS — E782 Mixed hyperlipidemia: Secondary | ICD-10-CM | POA: Diagnosis not present

## 2019-10-22 DIAGNOSIS — Z87891 Personal history of nicotine dependence: Secondary | ICD-10-CM

## 2019-10-22 DIAGNOSIS — I10 Essential (primary) hypertension: Secondary | ICD-10-CM | POA: Diagnosis not present

## 2019-10-22 MED ORDER — METOPROLOL SUCCINATE ER 25 MG PO TB24: 25.0000 mg | ORAL_TABLET | Freq: Every morning | ORAL | 0 refills | Status: DC

## 2019-10-24 DIAGNOSIS — I69351 Hemiplegia and hemiparesis following cerebral infarction affecting right dominant side: Secondary | ICD-10-CM | POA: Diagnosis not present

## 2019-10-24 DIAGNOSIS — E785 Hyperlipidemia, unspecified: Secondary | ICD-10-CM | POA: Diagnosis not present

## 2019-10-24 DIAGNOSIS — I4891 Unspecified atrial fibrillation: Secondary | ICD-10-CM | POA: Diagnosis not present

## 2019-10-24 DIAGNOSIS — I10 Essential (primary) hypertension: Secondary | ICD-10-CM | POA: Diagnosis not present

## 2019-10-27 DIAGNOSIS — E785 Hyperlipidemia, unspecified: Secondary | ICD-10-CM | POA: Diagnosis not present

## 2019-10-27 DIAGNOSIS — I10 Essential (primary) hypertension: Secondary | ICD-10-CM | POA: Diagnosis not present

## 2019-10-27 DIAGNOSIS — I69351 Hemiplegia and hemiparesis following cerebral infarction affecting right dominant side: Secondary | ICD-10-CM | POA: Diagnosis not present

## 2019-10-27 DIAGNOSIS — I4891 Unspecified atrial fibrillation: Secondary | ICD-10-CM | POA: Diagnosis not present

## 2019-10-28 DIAGNOSIS — E785 Hyperlipidemia, unspecified: Secondary | ICD-10-CM | POA: Diagnosis not present

## 2019-10-28 DIAGNOSIS — I69351 Hemiplegia and hemiparesis following cerebral infarction affecting right dominant side: Secondary | ICD-10-CM | POA: Diagnosis not present

## 2019-10-28 DIAGNOSIS — I4891 Unspecified atrial fibrillation: Secondary | ICD-10-CM | POA: Diagnosis not present

## 2019-10-28 DIAGNOSIS — I10 Essential (primary) hypertension: Secondary | ICD-10-CM | POA: Diagnosis not present

## 2019-10-31 DIAGNOSIS — E785 Hyperlipidemia, unspecified: Secondary | ICD-10-CM | POA: Diagnosis not present

## 2019-10-31 DIAGNOSIS — I69351 Hemiplegia and hemiparesis following cerebral infarction affecting right dominant side: Secondary | ICD-10-CM | POA: Diagnosis not present

## 2019-10-31 DIAGNOSIS — I10 Essential (primary) hypertension: Secondary | ICD-10-CM | POA: Diagnosis not present

## 2019-10-31 DIAGNOSIS — I4891 Unspecified atrial fibrillation: Secondary | ICD-10-CM | POA: Diagnosis not present

## 2019-11-01 DIAGNOSIS — I69351 Hemiplegia and hemiparesis following cerebral infarction affecting right dominant side: Secondary | ICD-10-CM | POA: Diagnosis not present

## 2019-11-05 ENCOUNTER — Ambulatory Visit: Payer: Medicare Other

## 2019-11-05 ENCOUNTER — Other Ambulatory Visit: Payer: Self-pay

## 2019-11-05 DIAGNOSIS — I4819 Other persistent atrial fibrillation: Secondary | ICD-10-CM

## 2019-11-07 ENCOUNTER — Telehealth: Payer: Self-pay

## 2019-11-07 NOTE — Telephone Encounter (Signed)
Left vm to cb.

## 2019-11-07 NOTE — Progress Notes (Signed)
Left vm to cb.

## 2019-11-07 NOTE — Telephone Encounter (Signed)
-----   Message from [place], [place] sent at 11/06/2019 11:14 PM EDT ----- The nuclear stress test that was recently performed was essentially reported as being normal perfusion and low risk study.    The other details of the report will be discussed at the next office visit.    If you continue to have symptoms that have increased in intensity, frequency, duration or new symptoms suggestive of typical chest pain as discussed during last office visit please still seek medical attention at the closest ER via EMS.

## 2019-11-07 NOTE — Progress Notes (Signed)
Spoke with patient. Patient voiced understanding.

## 2019-11-11 ENCOUNTER — Other Ambulatory Visit: Payer: Self-pay

## 2019-11-11 ENCOUNTER — Ambulatory Visit (INDEPENDENT_AMBULATORY_CARE_PROVIDER_SITE_OTHER): Payer: Medicare Other | Admitting: Family Medicine

## 2019-11-11 ENCOUNTER — Encounter: Payer: Self-pay | Admitting: Family Medicine

## 2019-11-11 VITALS — BP 120/70 | HR 56 | Temp 97.8°F | Wt 167.0 lb

## 2019-11-11 DIAGNOSIS — I4891 Unspecified atrial fibrillation: Secondary | ICD-10-CM

## 2019-11-11 DIAGNOSIS — I69959 Hemiplegia and hemiparesis following unspecified cerebrovascular disease affecting unspecified side: Secondary | ICD-10-CM | POA: Diagnosis not present

## 2019-11-11 DIAGNOSIS — I4510 Unspecified right bundle-branch block: Secondary | ICD-10-CM

## 2019-11-11 DIAGNOSIS — I6523 Occlusion and stenosis of bilateral carotid arteries: Secondary | ICD-10-CM | POA: Diagnosis not present

## 2019-11-11 DIAGNOSIS — E785 Hyperlipidemia, unspecified: Secondary | ICD-10-CM | POA: Diagnosis not present

## 2019-11-11 DIAGNOSIS — I10 Essential (primary) hypertension: Secondary | ICD-10-CM

## 2019-11-11 NOTE — Progress Notes (Signed)
   Subjective:    Patient ID: Richard Kent, male    DOB: 1928/10/13, 86 y.o.   MRN: [id number]  HPI He is here for follow-up visit after recent hospitalization.  He was found to be in atrial fibrillation.  Presently he is on Eliquis.  His medications were readjusted concerning his underlying blood pressure to cardiovascular condition.  He was seen recently by cardiology.  Presently he is taking metoprolol and has stopped Plavix as well as Coreg.  He does have a history of bilateral carotid stenosis and does need follow-up concerning that.  Presently he is having no chest pain, shortness of breath, weakness.  His wife continues to take good care of him.   Review of Systems     Objective:   Physical Exam Alert and in no distress.  Cardiac exam does show a regular rhythm.  Lungs are clear to auscultation. The hospital record including emergency room and discharge summary was reviewed as well as note from cardiology.      Assessment & Plan:  Atrial fibrillation, unspecified type ([place])  Hemiplegia as late effect of cerebrovascular disease, unspecified cerebrovascular disease type, unspecified hemiplegia type, unspecified laterality ([place])  Hyperlipidemia LDL goal <100  Essential hypertension - Plan: CBC with Differential/Platelet, Comprehensive metabolic panel  Carotid stenosis, bilateral - Plan: US Carotid Duplex Bilateral  RBBB Presently he is not in atrial fib but he will continue on his Eliquis.  I will do routine blood screening on him and also order a carotid Doppler to reevaluate his carotids.  Over 25 minutes spent discussing these issues with him and his wife.  All questions were answered.

## 2019-11-12 LAB — COMPREHENSIVE METABOLIC PANEL
ALT: 29 IU/L (ref 0–44)
AST: 20 IU/L (ref 0–40)
Albumin/Globulin Ratio: 1.7 (ref 1.2–2.2)
Albumin: 4.2 g/dL (ref 3.6–4.6)
Alkaline Phosphatase: 69 IU/L (ref 48–121)
BUN/Creatinine Ratio: 13 (ref 10–24)
BUN: 12 mg/dL (ref 8–27)
Bilirubin Total: 0.4 mg/dL (ref 0.0–1.2)
CO2: 26 mmol/L (ref 20–29)
Calcium: 8.9 mg/dL (ref 8.6–10.2)
Chloride: 105 mmol/L (ref 96–106)
Creatinine, Ser: 0.92 mg/dL (ref 0.76–1.27)
GFR calc Af Amer: 86 mL/min/{1.73_m2} (ref >59)
GFR calc non Af Amer: 74 mL/min/{1.73_m2} (ref >59)
Globulin, Total: 2.5 g/dL (ref 1.5–4.5)
Glucose: 94 mg/dL (ref 65–99)
Potassium: 4.2 mmol/L (ref 3.5–5.2)
Sodium: 142 mmol/L (ref 134–144)
Total Protein: 6.7 g/dL (ref 6.0–8.5)

## 2019-11-12 LAB — CBC WITH DIFFERENTIAL/PLATELET
Basophils Absolute: 0.1 10*3/uL (ref 0.0–0.2)
Basos: 1 %
EOS (ABSOLUTE): 0.1 10*3/uL (ref 0.0–0.4)
Eos: 2 %
Hematocrit: 45.2 % (ref 37.5–51.0)
Hemoglobin: 15.4 g/dL (ref 13.0–17.7)
Immature Grans (Abs): 0 10*3/uL (ref 0.0–0.1)
Immature Granulocytes: 0 %
Lymphocytes Absolute: 1.6 10*3/uL (ref 0.7–3.1)
Lymphs: 21 %
MCH: 33.8 pg — ABNORMAL HIGH (ref 26.6–33.0)
MCHC: 34.1 g/dL (ref 31.5–35.7)
MCV: 99 fL — ABNORMAL HIGH (ref 79–97)
Monocytes Absolute: 0.9 10*3/uL (ref 0.1–0.9)
Monocytes: 12 %
Neutrophils Absolute: 5 10*3/uL (ref 1.4–7.0)
Neutrophils: 64 %
Platelets: 246 10*3/uL (ref 150–450)
RBC: 4.55 x10E6/uL (ref 4.14–5.80)
RDW: 13 % (ref 11.6–15.4)
WBC: 7.7 10*3/uL (ref 3.4–10.8)

## 2019-11-19 ENCOUNTER — Encounter: Payer: Self-pay | Admitting: Cardiology

## 2019-11-19 ENCOUNTER — Other Ambulatory Visit: Payer: Self-pay

## 2019-11-19 ENCOUNTER — Other Ambulatory Visit: Payer: Self-pay | Admitting: Cardiology

## 2019-11-19 ENCOUNTER — Ambulatory Visit: Payer: Medicare Other | Admitting: Cardiology

## 2019-11-19 VITALS — BP 149/66 | HR 60 | Resp 17 | Ht 72.0 in | Wt 167.0 lb

## 2019-11-19 DIAGNOSIS — E782 Mixed hyperlipidemia: Secondary | ICD-10-CM

## 2019-11-19 DIAGNOSIS — I4510 Unspecified right bundle-branch block: Secondary | ICD-10-CM

## 2019-11-19 DIAGNOSIS — Z7901 Long term (current) use of anticoagulants: Secondary | ICD-10-CM

## 2019-11-19 DIAGNOSIS — I480 Paroxysmal atrial fibrillation: Secondary | ICD-10-CM

## 2019-11-19 DIAGNOSIS — I10 Essential (primary) hypertension: Secondary | ICD-10-CM

## 2019-11-19 DIAGNOSIS — I6523 Occlusion and stenosis of bilateral carotid arteries: Secondary | ICD-10-CM

## 2019-11-19 DIAGNOSIS — Z87891 Personal history of nicotine dependence: Secondary | ICD-10-CM

## 2019-11-19 MED ORDER — METOPROLOL SUCCINATE ER 25 MG PO TB24: 12.5000 mg | ORAL_TABLET | Freq: Every day | ORAL | 1 refills | Status: DC

## 2019-11-19 NOTE — Progress Notes (Signed)
Date:  11/19/2019   ID:  Richard Kent, DOB May 12, 1929, MRN [id number]  PCP:  Denita Lung, MD  Cardiologist:  Rex Kras, DO, [facility] (established care 11/18/2019) Former Cardiology Providers:  Sueanne Margarita, MD and Burnell Blanks, MD  Date: 11/19/19 Last Office Visit: 10/22/2019  Chief Complaint  Patient presents with  . Atrial Fibrillation  . Follow-up    HPI  Richard Kent is a 86 y.o. male who presents to the office for management of atrial fibrillation and review test results.  He is accompanied by his wife at today's office visit.  Patient's past medical history and cardiovascular risk factors include: History of stroke in 2001, bilateral carotid artery disease, hypertension, hyperlipidemia, right bundle branch block, GERD, recently diagnosed with atrial fibrillation, advanced age.  Patient was referred to the office at the request of his primary care provider for evaluation and management of atrial fibrillation back in June 2021.  Atrial fibrillation, paroxysmal: Patient was hospitalized in May 2021 for GI symptoms and during that hospitalization he had an EKG which showed atrial fibrillation and patient underwent an echocardiogram for further with stratification prior to discharge.  At the time of discharge she was started on oral anticoagulation for thromboembolic prophylaxis.  And was asked to follow-up with cardiology as outpatient for further management.  Due to the new onset of atrial fibrillation patient was recommended to undergo stress test.  Results were reviewed with him and his wife in great detail.  We also proceeded with a 14-day monitor to evaluate his overall atrial fibrillation burden.  The results of the monitor were reviewed with him and his wife at today's office visit, final report forthcoming.    Since last office visit patient is doing well and no cardiovascular symptoms.  He does not endorse any evidence of bleeding.    At last office visit he  was noted to have carotid artery disease on multiple imaging studies.  [name] states that she did discuss this further with his primary care provider and a carotid duplex has been ordered for further evaluation and management.    Currently patient denies any chest pain or anginal equivalent.  History of stroke in 2001. Denies prior history of coronary artery disease, myocardial infarction, congestive heart failure, deep venous thrombosis, pulmonary embolism,  transient ischemic attack.  FUNCTIONAL STATUS: No structured exercise or daily routine. He ambulates with cane and walker while at home. Functional status is limited due to prior stroke with right sided residual deficits.   ALLERGIES: No Known Allergies  MEDICATION LIST PRIOR TO VISIT: Current Meds  Medication Sig  . amLODipine (NORVASC) 2.5 MG tablet Take 2.5 mg by mouth daily.  Marland Kitchen apixaban (ELIQUIS) 5 MG TABS tablet Take 1 tablet (5 mg total) by mouth 2 (two) times daily.  . Ascorbic Acid (VITAMIN C PO) Take by mouth.  Marland Kitchen aspirin 81 MG tablet Take 81 mg by mouth every morning.   . baclofen (LIORESAL) 10 MG tablet Take 1 tablet (10 mg total) by mouth 3 (three) times daily as needed for muscle spasms.  . Multiple Vitamin (MULTIVITAMIN WITH MINERALS) TABS Take 1 tablet by mouth every morning. Reported on 09/06/2015  . ondansetron (ZOFRAN) 4 MG tablet Take 1 tablet (4 mg total) by mouth every 6 (six) hours as needed for nausea.  . simvastatin (ZOCOR) 20 MG tablet TAKE 1 TABLET BY MOUTH ONCE DAILY IN THE EVENING (Patient taking differently: Take 20 mg by mouth daily at 6 PM. )  .  VITAMIN D PO Take by mouth.  . [DISCONTINUED] metoprolol succinate (TOPROL XL) 25 MG 24 hr tablet Take 1 tablet (25 mg total) by mouth in the morning. Hold if systolic blood pressure (top blood pressure number) less than 100 mmHg or heart rate less than 60 bpm (pulse).   Current Facility-Administered Medications for the 11/19/19 encounter (Office Visit) with  Rex Kras, DO  Medication  . botulinum toxin Type A (BOTOX) injection 300 Units     PAST MEDICAL HISTORY: Past Medical History:  Diagnosis Date  . Carotid stenosis   . CVA (cerebral infarction) 2011  . Diverticulosis   . Hyperlipidemia   . Hypertension   . Kidney stone   . Smoker   . Stroke [facility]) 11/17/09    PAST SURGICAL HISTORY: Past Surgical History:  Procedure Laterality Date  . APPENDECTOMY    . HERNIA REPAIR     RIGHT  . HIP ARTHROPLASTY  01/15/2012   Procedure: ARTHROPLASTY BIPOLAR HIP;  Surgeon: Rozanna Box, MD;  Location: [place] [facility];  Service: Orthopedics;  Laterality: Right;    FAMILY HISTORY: The patient family history includes Cancer in his mother; Cervical cancer in his sister; Heart attack in his sister; Heart disease in his father; Stroke in his mother.  SOCIAL HISTORY:  The patient  reports that he quit smoking about 47 years ago. His smoking use included cigarettes. He has a 40.00 pack-year smoking history. He has never used smokeless tobacco. He reports that he does not drink alcohol and does not use drugs.  REVIEW OF SYSTEMS: Review of Systems  Constitutional: Negative for chills, diaphoresis and fever.  HENT: Negative for hoarse voice and nosebleeds.   Eyes: Negative for discharge, double vision and pain.  Cardiovascular: Negative for chest pain, claudication, dyspnea on exertion, leg swelling, near-syncope, orthopnea, palpitations, paroxysmal nocturnal dyspnea and syncope.  Respiratory: Negative for hemoptysis and shortness of breath.   Musculoskeletal: Negative for muscle cramps and myalgias.  Gastrointestinal: Negative for abdominal pain, constipation, diarrhea, hematemesis, hematochezia, melena, nausea and vomiting.  Neurological: Negative for dizziness and light-headedness.    PHYSICAL EXAM: Vitals with BMI 11/19/2019 11/11/2019 10/22/2019  Height 6\' 0"  - 6\' 0"   Weight 167 lbs 167 lbs 166 lbs  BMI 22.64 86.76 19.50  Systolic 932 671 245   Diastolic 66 70 70  Pulse 60 56 82    CONSTITUTIONAL: Appears older than stated age, hemodynamically stable, no acute distress.  Sitting upright in his wheelchair.  SKIN: Skin is warm and dry. No rash noted. No cyanosis. No pallor. No jaundice HEAD: Normocephalic and atraumatic.  EYES: No scleral icterus MOUTH/THROAT: Moist oral membranes.  NECK: No JVD present. No thyromegaly noted.  Right-sided carotid bruit LYMPHATIC: No visible cervical adenopathy.  CHEST Normal respiratory effort. No intercostal retractions  LUNGS: Clear to auscultation bilaterally.  No stridor. No wheezes. No rales.  CARDIOVASCULAR: Irregularly irregular, positive S1-S2, no murmurs rubs or gallops appreciated ABDOMINAL: Soft, nontender, nondistended positive bowel sounds in all 4 quadrants, no apparent ascites.  EXTREMITIES: No peripheral edema.  Brace in the right lower extremity.  HEMATOLOGIC: No significant bruising NEUROLOGIC: Oriented to person, place, and time. Nonfocal. Normal muscle tone.  Decrease strength in right upper and lower extremity.  PSYCHIATRIC: Normal mood and affect. Normal behavior. Cooperative  RADIOLOGY: MR Head: 06/12/2010: No antegrade flow is seen in the left ICA.  The right ICA supplies the right middle cerebral artery territory and both anterior cerebral artery territories due to a patent anterior communicating artery. There  is a mild stenosis at the anterior cerebral artery origin. There is reconstituted flow in the left middle cerebral artery territory, poorly demonstrated by MRA but better shown on CTA. Distal left vertebral artery narrowing estimated at 50%.  This is the nondominant vertebral.  CTA head and neck:  05/2010: Complete occlusion left internal carotid artery. 50% stenosis right internal carotid artery with calcific plaque. 75 % stenosis on the left vertebral origin.  CARDIAC DATABASE: EKG:  10/22/2019: Atrial fibrillation, 84 bpm, left axis deviation, right bundle branch  block, no underlying injury pattern.  Echocardiogram: 09/28/2019: LVEF 60 to 65%, no regional wall motion abnormalities, mild LVH, indeterminate diastolic parameters, mildly dilated left atrium, no significant valvular heart disease.  Stress Testing: Lexiscan Tetrofosmin stress test 11/05/2019: Lexiscan nuclear stress test performed using 1-day protocol. Stress EKG is non-diagnostic, as this is pharmacological stress test. Normal myocardial perfusion. Stress LVEF 70%. Low risk study.    Heart Catheterization: None  Carotid duplex: 12/2016: Findings consistent with 60-79% stenosis by velocity involving the right internal carotid artery. Velocities may be falsely elevated due to compensatory flow. Findings consistent with greater than 80% stenosis involving the left internal carotid artery.  Bilateral vertebral artery flow is antegrade.   14-day mobile cardiac ambulatory telemetry: Final report forthcoming.  LABORATORY DATA: CBC Latest Ref Rng & Units 11/11/2019 09/28/2019 09/27/2019  WBC 3.4 - 10.8 x10E3/uL 7.7 10.5 14.2(H)  Hemoglobin 13.0 - 17.7 g/dL 15.4 12.4(L) 13.2  Hematocrit 37.5 - 51.0 % 45.2 35.1(L) 38.1(L)  Platelets 150 - 450 x10E3/uL 246 219 223    CMP Latest Ref Rng & Units 11/11/2019 09/28/2019 09/27/2019  Glucose 65 - 99 mg/dL 94 110(H) 114(H)  BUN 8 - 27 mg/dL 12 23 43(H)  Creatinine 0.76 - 1.27 mg/dL 0.92 0.95 1.06  Sodium 134 - 144 mmol/L 142 136 135  Potassium 3.5 - 5.2 mmol/L 4.2 3.5 3.5  Chloride 96 - 106 mmol/L 105 106 104  CO2 20 - 29 mmol/L 26 22 19(L)  Calcium 8.6 - 10.2 mg/dL 8.9 7.8(L) 7.7(L)  Total Protein 6.0 - 8.5 g/dL 6.7 - -  Total Bilirubin 0.0 - 1.2 mg/dL 0.4 - -  Alkaline Phos 48 - 121 IU/L 69 - -  AST 0 - 40 IU/L 20 - -  ALT 0 - 44 IU/L 29 - -    Lipid Panel     Component Value Date/Time   CHOL 134 06/03/2019 1348   TRIG 113 06/03/2019 1348   HDL 44 06/03/2019 1348   CHOLHDL 3.0 06/03/2019 1348   CHOLHDL 2.8 01/09/2017 1004   VLDL 15 01/09/2017  1004   LDLCALC 69 06/03/2019 1348   LABVLDL 21 06/03/2019 1348   No components found for: NTPROBNP Lab Results  Component Value Date   TSH 0.544 09/27/2019   IMPRESSION:    ICD-10-CM   1. Paroxysmal atrial fibrillation (HCC)  I48.0 metoprolol succinate (TOPROL XL) 25 MG 24 hr tablet  2. Long term (current) use of anticoagulants  Z61.09 Basic metabolic panel    CBC    CBC    Basic metabolic panel  3. Carotid stenosis, bilateral  I65.23   4. Essential hypertension  I10   5. Mixed hyperlipidemia  E78.2   6. Right bundle branch block  I45.10   7. Former smoker  Z87.891      RECOMMENDATIONS: Richard Kent is a 86 y.o. male whose past medical history and cardiac risk factors include: History of stroke in 2001, bilateral carotid artery disease, hypertension, hyperlipidemia,  right bundle branch block, GERD, recently diagnosed with atrial fibrillation, advanced age.  Paroxysmal atrial fibrillation  Patient was diagnosed with atrial fibrillation while he was hospitalized in May 2021.  CHA2DS2-VASc SCORE is 6 which correlates to 9.8% risk of stroke per year.  Rate control: Toprol-XL.  Patient states that she usually ends up giving him 25 mg of Toprol-XL every other day due to his ventricular rate being less than 60 bpm therefore, recommended decreasing Toprol-XL to 12.5 mg p.o. daily.  Thromboembolic prophylaxis: Eliquis, does not endorse any evidence of bleeding.  Reviewed results, nuclear stress test with the patient and his wife at today's office visit as well as to a 14-day monitor that he wore earlier this month.  Bilateral carotid artery stenosis:  Currently being followed by his primary care provider, as per his wife.    Currently on aspirin and statin therapy.  Former smoker: Educated on the importance of continued smoking cessation.  FINAL MEDICATION LIST END OF ENCOUNTER: Meds ordered this encounter  Medications  . metoprolol succinate (TOPROL XL) 25 MG 24 hr tablet     Sig: Take 0.5 tablets (12.5 mg total) by mouth daily. Hold if systolic blood pressure (top blood pressure number) less than 100 mmHg or heart rate less than 60 bpm (pulse).    Dispense:  45 tablet    Refill:  1    Medications Discontinued During This Encounter  Medication Reason  . saccharomyces boulardii (FLORASTOR) 250 MG capsule Completed Course  . metoprolol succinate (TOPROL XL) 25 MG 24 hr tablet Dose change     Current Outpatient Medications:  .  amLODipine (NORVASC) 2.5 MG tablet, Take 2.5 mg by mouth daily., Disp: , Rfl:  .  apixaban (ELIQUIS) 5 MG TABS tablet, Take 1 tablet (5 mg total) by mouth 2 (two) times daily., Disp: 60 tablet, Rfl: 2 .  Ascorbic Acid (VITAMIN C PO), Take by mouth., Disp: , Rfl:  .  aspirin 81 MG tablet, Take 81 mg by mouth every morning. , Disp: , Rfl:  .  baclofen (LIORESAL) 10 MG tablet, Take 1 tablet (10 mg total) by mouth 3 (three) times daily as needed for muscle spasms., Disp: 270 tablet, Rfl: 3 .  Multiple Vitamin (MULTIVITAMIN WITH MINERALS) TABS, Take 1 tablet by mouth every morning. Reported on 09/06/2015, Disp: , Rfl:  .  ondansetron (ZOFRAN) 4 MG tablet, Take 1 tablet (4 mg total) by mouth every 6 (six) hours as needed for nausea., Disp: 20 tablet, Rfl: 0 .  simvastatin (ZOCOR) 20 MG tablet, TAKE 1 TABLET BY MOUTH ONCE DAILY IN THE EVENING (Patient taking differently: Take 20 mg by mouth daily at 6 PM. ), Disp: 90 tablet, Rfl: 3 .  VITAMIN D PO, Take by mouth., Disp: , Rfl:  .  metoprolol succinate (TOPROL XL) 25 MG 24 hr tablet, Take 0.5 tablets (12.5 mg total) by mouth daily. Hold if systolic blood pressure (top blood pressure number) less than 100 mmHg or heart rate less than 60 bpm (pulse)., Disp: 45 tablet, Rfl: 1  Current Facility-Administered Medications:  .  botulinum toxin Type A (BOTOX) injection 300 Units, 300 Units, Intramuscular, Once, Marcial Pacas, MD  Orders Placed This Encounter  Procedures  . Basic metabolic panel  . CBC    --Continue cardiac medications as reconciled in final medication list. --Return in about 3 months (around 02/23/2020) for afib follow up and EKG on arrival. Or sooner if needed. --Continue follow-up with your primary care physician regarding the management  of your other chronic comorbid conditions.  Patient's questions and concerns were addressed to his satisfaction. He voices understanding of the instructions provided during this encounter.   This note was created using a voice recognition software as a result there may be grammatical errors inadvertently enclosed that do not reflect the nature of this encounter. Every attempt is made to correct such errors.  Rex Kras, [place], [facility]  Pager: [phone] Office: [phone]

## 2019-11-28 ENCOUNTER — Telehealth: Payer: Self-pay | Admitting: Family Medicine

## 2019-11-28 NOTE — Telephone Encounter (Signed)
Richard Kent with [facility] called Discharging home health today with all goals met

## 2019-12-22 ENCOUNTER — Telehealth: Payer: Self-pay | Admitting: Family Medicine

## 2019-12-22 ENCOUNTER — Other Ambulatory Visit: Payer: Self-pay

## 2019-12-22 ENCOUNTER — Telehealth: Payer: Self-pay

## 2019-12-22 MED ORDER — APIXABAN 5 MG PO TABS: 5.0000 mg | ORAL_TABLET | Freq: Two times a day (BID) | ORAL | 3 refills | Status: DC

## 2019-12-22 MED ORDER — AMLODIPINE BESYLATE 2.5 MG PO TABS: 2.5000 mg | ORAL_TABLET | Freq: Every day | ORAL | 3 refills | Status: DC

## 2019-12-22 MED ORDER — AMLODIPINE BESYLATE 2.5 MG PO TABS: 2.5000 mg | ORAL_TABLET | Freq: Every day | ORAL | 1 refills | Status: DC

## 2019-12-22 NOTE — Telephone Encounter (Signed)
Wife called & states she forgot to ask for refills at pt's hospital follow for the new meds he was given in the hospital & he's about to run out.  He needs Eliquis 5mg  BID 1 am & 1 qhs & Amlodipine 2.5mg  1 qd & to [facility]

## 2019-12-22 NOTE — Telephone Encounter (Signed)
Received fax from [facility] for a refill on the pts. Amlodipine 2.5mg  pts. Last apt was 11/11/19.

## 2019-12-30 ENCOUNTER — Other Ambulatory Visit: Payer: Self-pay

## 2019-12-30 DIAGNOSIS — I480 Paroxysmal atrial fibrillation: Secondary | ICD-10-CM

## 2019-12-30 MED ORDER — METOPROLOL SUCCINATE ER 25 MG PO TB24: 12.5000 mg | ORAL_TABLET | Freq: Every day | ORAL | 1 refills | Status: DC

## 2020-01-02 ENCOUNTER — Other Ambulatory Visit: Payer: Self-pay

## 2020-01-02 DIAGNOSIS — I480 Paroxysmal atrial fibrillation: Secondary | ICD-10-CM

## 2020-01-02 MED ORDER — METOPROLOL SUCCINATE ER 25 MG PO TB24: 12.5000 mg | ORAL_TABLET | Freq: Every day | ORAL | 1 refills | Status: DC

## 2020-02-12 ENCOUNTER — Ambulatory Visit
Admission: RE | Admit: 2020-02-12 | Discharge: 2020-02-12 | Disposition: A | Discharge status: Home / Self Care | Payer: Medicare Other | Source: Ambulatory Visit | Attending: Family Medicine | Admitting: Family Medicine

## 2020-02-12 DIAGNOSIS — I6523 Occlusion and stenosis of bilateral carotid arteries: Secondary | ICD-10-CM | POA: Diagnosis not present

## 2020-02-16 ENCOUNTER — Other Ambulatory Visit: Payer: Self-pay | Admitting: Family Medicine

## 2020-02-17 DIAGNOSIS — Z7901 Long term (current) use of anticoagulants: Secondary | ICD-10-CM | POA: Diagnosis not present

## 2020-02-18 LAB — BASIC METABOLIC PANEL
BUN/Creatinine Ratio: 17 (ref 10–24)
BUN: 15 mg/dL (ref 8–27)
CO2: 23 mmol/L (ref 20–29)
Calcium: 8.9 mg/dL (ref 8.6–10.2)
Chloride: 104 mmol/L (ref 96–106)
Creatinine, Ser: 0.9 mg/dL (ref 0.76–1.27)
GFR calc Af Amer: 87 mL/min/{1.73_m2} (ref >59)
GFR calc non Af Amer: 75 mL/min/{1.73_m2} (ref >59)
Glucose: 106 mg/dL — ABNORMAL HIGH (ref 65–99)
Potassium: 4.5 mmol/L (ref 3.5–5.2)
Sodium: 141 mmol/L (ref 134–144)

## 2020-02-18 LAB — CBC
Hematocrit: 45.1 % (ref 37.5–51.0)
Hemoglobin: 16.3 g/dL (ref 13.0–17.7)
MCH: 34.8 pg — ABNORMAL HIGH (ref 26.6–33.0)
MCHC: 36.1 g/dL — ABNORMAL HIGH (ref 31.5–35.7)
MCV: 96 fL (ref 79–97)
Platelets: 224 10*3/uL (ref 150–450)
RBC: 4.69 x10E6/uL (ref 4.14–5.80)
RDW: 13.1 % (ref 11.6–15.4)
WBC: 7.8 10*3/uL (ref 3.4–10.8)

## 2020-02-20 ENCOUNTER — Other Ambulatory Visit: Payer: Self-pay

## 2020-02-20 DIAGNOSIS — I6523 Occlusion and stenosis of bilateral carotid arteries: Secondary | ICD-10-CM

## 2020-02-20 DIAGNOSIS — I69959 Hemiplegia and hemiparesis following unspecified cerebrovascular disease affecting unspecified side: Secondary | ICD-10-CM

## 2020-02-24 ENCOUNTER — Other Ambulatory Visit: Payer: Self-pay

## 2020-02-24 ENCOUNTER — Other Ambulatory Visit (INDEPENDENT_AMBULATORY_CARE_PROVIDER_SITE_OTHER): Payer: Medicare Other

## 2020-02-24 DIAGNOSIS — Z23 Encounter for immunization: Secondary | ICD-10-CM | POA: Diagnosis not present

## 2020-02-26 ENCOUNTER — Other Ambulatory Visit: Payer: Self-pay

## 2020-02-26 ENCOUNTER — Encounter: Payer: Self-pay | Admitting: Cardiology

## 2020-02-26 ENCOUNTER — Ambulatory Visit: Payer: Medicare Other | Admitting: Cardiology

## 2020-02-26 VITALS — BP 145/72 | HR 76 | Resp 16 | Ht 72.0 in | Wt 167.0 lb

## 2020-02-26 DIAGNOSIS — Z87891 Personal history of nicotine dependence: Secondary | ICD-10-CM

## 2020-02-26 DIAGNOSIS — I480 Paroxysmal atrial fibrillation: Secondary | ICD-10-CM | POA: Diagnosis not present

## 2020-02-26 DIAGNOSIS — I10 Essential (primary) hypertension: Secondary | ICD-10-CM

## 2020-02-26 DIAGNOSIS — E782 Mixed hyperlipidemia: Secondary | ICD-10-CM | POA: Diagnosis not present

## 2020-02-26 DIAGNOSIS — I6523 Occlusion and stenosis of bilateral carotid arteries: Secondary | ICD-10-CM

## 2020-02-26 DIAGNOSIS — Z7901 Long term (current) use of anticoagulants: Secondary | ICD-10-CM

## 2020-02-26 DIAGNOSIS — I4510 Unspecified right bundle-branch block: Secondary | ICD-10-CM

## 2020-02-26 NOTE — Progress Notes (Signed)
ID:  Richard Kent, DOB 04-Mar-1929, MRN [id number]  PCP:  Denita Lung, MD  Cardiologist:  Rex Kras, DO, [facility] (established care 11/18/2019) Former Cardiology Providers:  Sueanne Margarita, MD and Burnell Blanks, MD  Date: 02/26/20 Last Office Visit: 11/19/2019  Chief Complaint  Patient presents with  . Atrial Fibrillation  . Follow-up    3 month     HPI  Richard Kent is a 86 y.o. male who presents to the office with a chief complaint of "A. fib management." He is accompanied by his wife at today's office visit.  Patient's past medical history and cardiovascular risk factors include: History of stroke in 2011, bilateral carotid artery disease, hypertension, hyperlipidemia, right bundle branch block, GERD, recently diagnosed with atrial fibrillation, advanced age.  Patient was referred to the office at the request of his primary care provider for evaluation and management of atrial fibrillation back in June 2021.  Atrial fibrillation, paroxysmal: Patient was hospitalized in May 2021 for GI symptoms and during that hospitalization he had an EKG which showed atrial fibrillation and patient underwent an echocardiogram for further with stratification prior to discharge.  At the time of discharge he was started on oral anticoagulation for thromboembolic prophylaxis.  And was asked to follow-up with cardiology as outpatient for further management.  Due to the new onset of atrial fibrillation patient was recommended to undergo stress test which was reported to be low risk.  He also underwent 14-day mobile cardiac ambulatory telemetry to evaluate radiation burden.  He was noted to be in both A. fib and normal sinus rhythm intermittently additional details noted below for the reference.   Since last office visit patient is doing well from cardiovascular standpoint.  Patient's ventricular rate is well controlled.  He is tolerating Toprol-XL that was changed at the last visit.  Patient is  wife states that he does not recognize he goes into A. fib he is relatively asymptomatic.  They do not endorse any evidence of bleeding.  Fall precautions were discussed.  FUNCTIONAL STATUS: No structured exercise or daily routine. He ambulates with cane and walker while at home. Functional status is limited due to prior stroke with right sided residual deficits.   ALLERGIES: No Known Allergies  MEDICATION LIST PRIOR TO VISIT: Current Meds  Medication Sig  . amLODipine (NORVASC) 2.5 MG tablet Take 1 tablet (2.5 mg total) by mouth daily.  Marland Kitchen apixaban (ELIQUIS) 5 MG TABS tablet Take 1 tablet (5 mg total) by mouth 2 (two) times daily.  . Ascorbic Acid (VITAMIN C PO) Take by mouth.  Marland Kitchen aspirin 81 MG tablet Take 81 mg by mouth every morning.   . baclofen (LIORESAL) 10 MG tablet Take 1 tablet (10 mg total) by mouth 3 (three) times daily as needed for muscle spasms.  . metoprolol succinate (TOPROL XL) 25 MG 24 hr tablet Take 0.5 tablets (12.5 mg total) by mouth daily. Hold if systolic blood pressure (top blood pressure number) less than 100 mmHg or heart rate less than 60 bpm (pulse).  . Multiple Vitamin (MULTIVITAMIN WITH MINERALS) TABS Take 1 tablet by mouth every morning. Reported on 09/06/2015  . ondansetron (ZOFRAN) 4 MG tablet Take 1 tablet (4 mg total) by mouth every 6 (six) hours as needed for nausea.  . simvastatin (ZOCOR) 20 MG tablet TAKE 1 TABLET BY MOUTH ONCE DAILY IN THE EVENING (Patient taking differently: Take 20 mg by mouth daily at 6 PM. )  . VITAMIN D PO Take by  mouth.   Current Facility-Administered Medications for the 02/26/20 encounter (Office Visit) with Rex Kras, DO  Medication  . botulinum toxin Type A (BOTOX) injection 300 Units     PAST MEDICAL HISTORY: Past Medical History:  Diagnosis Date  . Carotid stenosis   . CVA (cerebral infarction) 2011  . Diverticulosis   . Hyperlipidemia   . Hypertension   . Kidney stone   . Smoker   . Stroke [facility]) 11/17/09    PAST  SURGICAL HISTORY: Past Surgical History:  Procedure Laterality Date  . APPENDECTOMY    . HERNIA REPAIR     RIGHT  . HIP ARTHROPLASTY  01/15/2012   Procedure: ARTHROPLASTY BIPOLAR HIP;  Surgeon: Rozanna Box, MD;  Location: [place];  Service: Orthopedics;  Laterality: Right;    FAMILY HISTORY: The patient family history includes Cancer in his mother; Cervical cancer in his sister; Heart attack in his sister; Heart disease in his father; Stroke in his mother.  SOCIAL HISTORY:  The patient  reports that he quit smoking about 48 years ago. His smoking use included cigarettes. He has a 40.00 pack-year smoking history. He has never used smokeless tobacco. He reports that he does not drink alcohol and does not use drugs.  REVIEW OF SYSTEMS: Review of Systems  Constitutional: Negative for chills, diaphoresis and fever.  HENT: Negative for hoarse voice and nosebleeds.   Eyes: Negative for discharge, double vision and pain.  Cardiovascular: Negative for chest pain, claudication, dyspnea on exertion, leg swelling, near-syncope, orthopnea, palpitations, paroxysmal nocturnal dyspnea and syncope.  Respiratory: Negative for hemoptysis and shortness of breath.   Musculoskeletal: Negative for muscle cramps and myalgias.  Gastrointestinal: Negative for abdominal pain, constipation, diarrhea, hematemesis, hematochezia, melena, nausea and vomiting.  Neurological: Negative for dizziness and light-headedness.    PHYSICAL EXAM: Vitals with BMI 02/26/2020 11/19/2019 11/11/2019  Height 6\' 0"  6\' 0"  -  Weight 167 lbs 167 lbs 167 lbs  BMI 22.64 06.23 76.28  Systolic 315 176 160  Diastolic 72 66 70  Pulse 76 60 56    CONSTITUTIONAL: Appears older than stated age, hemodynamically stable, no acute distress.  Sitting upright in his wheelchair.  SKIN: Skin is warm and dry. No rash noted. No cyanosis. No pallor. No jaundice HEAD: Normocephalic and atraumatic.  EYES: No scleral icterus MOUTH/THROAT: Moist oral  membranes.  NECK: No JVD present. No thyromegaly noted.  Right-sided carotid bruit LYMPHATIC: No visible cervical adenopathy.  CHEST Normal respiratory effort. No intercostal retractions  LUNGS: Clear to auscultation bilaterally.  No stridor. No wheezes. No rales.  CARDIOVASCULAR: Bradycardic, positive S1-S2 no murmurs rubs or gallops appreciated ABDOMINAL: Soft, nontender, nondistended positive bowel sounds in all 4 quadrants, no apparent ascites.  EXTREMITIES: No peripheral edema.  Brace in the right lower extremity.  HEMATOLOGIC: No significant bruising NEUROLOGIC: Oriented to person, place, and time. Nonfocal. Normal muscle tone.  Decrease strength in right upper and lower extremity.  PSYCHIATRIC: Normal mood and affect. Normal behavior. Cooperative  RADIOLOGY: MR Head: 06/12/2010: No antegrade flow is seen in the left ICA.  The right ICA supplies the right middle cerebral artery territory and both anterior cerebral artery territories due to a patent anterior communicating artery. There is a mild stenosis at the anterior cerebral artery origin. There is reconstituted flow in the left middle cerebral artery territory, poorly demonstrated by MRA but better shown on CTA. Distal left vertebral artery narrowing estimated at 50%.  This is the nondominant vertebral.  CTA head and neck:  05/2010: Complete occlusion left internal carotid artery. 50% stenosis right internal carotid artery with calcific plaque. 75 % stenosis on the left vertebral origin.  CARDIAC DATABASE: EKG:  10/22/2019: Atrial fibrillation, 84 bpm, left axis deviation, right bundle branch block, no underlying injury pattern.  Echocardiogram: 09/28/2019: LVEF 60 to 65%, no regional wall motion abnormalities, mild LVH, indeterminate diastolic parameters, mildly dilated left atrium, no significant valvular heart disease.  Stress Testing: Lexiscan Tetrofosmin stress test 11/05/2019: Lexiscan nuclear stress test performed using 1-day  protocol. Stress EKG is non-diagnostic, as this is pharmacological stress test. Normal myocardial perfusion. Stress LVEF 70%. Low risk study.    Heart Catheterization: None  Carotid duplex: 02/13/2020: 1. Right carotid artery system: Greater than 69% but less than near occlussion seconary to atherosclerotic plaque formation. 2. Left carotid artery system: Near occlusion secondary to atherosclerotic plaque formation. Flow velocities decrease from 330 centimeters/second proximally to 42 centimeters/second distally in the internal carotid artery. 3.  Vertebral artery system: Patent with antegrade flow bilaterally.  14 - day Mobile Cardiac Ambulatory Telemetry. Enrollment Period: 10/22/2019 - 11/04/2019 Predominant rhythm normal sinus followed by atrial fibrillation.  Normal sinus rhythm ranging from 47 - 59 bpm, average heart rate 62 bpm. Atrial Fibrillation ranging from 60 - 111 bpm, average heart rate 77 bpm. Atrial fibrillation burden 8% during monitoring period (approx 1day and 6 min.).  No pauses greater than or equal to 3 seconds. Total supraventricular ectopic burden N/A. Total ventricular ectopic burden <1%.  Heart rate < 60 bpm for 50% of the recording. Heart rate > 100 bpm for 0% of the recording.    LABORATORY DATA: CBC Latest Ref Rng & Units 02/17/2020 11/11/2019 09/28/2019  WBC 3.4 - 10.8 x10E3/uL 7.8 7.7 10.5  Hemoglobin 13.0 - 17.7 g/dL 16.3 15.4 12.4(L)  Hematocrit 37.5 - 51.0 % 45.1 45.2 35.1(L)  Platelets 150 - 450 x10E3/uL 224 246 219    CMP Latest Ref Rng & Units 02/17/2020 11/11/2019 09/28/2019  Glucose 65 - 99 mg/dL 106(H) 94 110(H)  BUN 8 - 27 mg/dL 15 12 23   Creatinine 0.76 - 1.27 mg/dL 0.90 0.92 0.95  Sodium 134 - 144 mmol/L 141 142 136  Potassium 3.5 - 5.2 mmol/L 4.5 4.2 3.5  Chloride 96 - 106 mmol/L 104 105 106  CO2 20 - 29 mmol/L 23 26 22   Calcium 8.6 - 10.2 mg/dL 8.9 8.9 7.8(L)  Total Protein 6.0 - 8.5 g/dL - 6.7 -  Total Bilirubin 0.0 - 1.2 mg/dL - 0.4 -   Alkaline Phos 48 - 121 IU/L - 69 -  AST 0 - 40 IU/L - 20 -  ALT 0 - 44 IU/L - 29 -    Lipid Panel     Component Value Date/Time   CHOL 134 06/03/2019 1348   TRIG 113 06/03/2019 1348   HDL 44 06/03/2019 1348   CHOLHDL 3.0 06/03/2019 1348   CHOLHDL 2.8 01/09/2017 1004   VLDL 15 01/09/2017 1004   LDLCALC 69 06/03/2019 1348   LABVLDL 21 06/03/2019 1348   No components found for: NTPROBNP Lab Results  Component Value Date   TSH 0.544 09/27/2019   IMPRESSION:    ICD-10-CM   1. Paroxysmal atrial fibrillation (HCC)  I48.0 EKG 12-Lead  2. Long term (current) use of anticoagulants  Z79.01   3. Carotid stenosis, bilateral  I65.23   4. Essential hypertension  I10   5. Mixed hyperlipidemia  E78.2   6. Right bundle branch block  I45.10   7. Former smoker  I43.329      RECOMMENDATIONS: Richard Kent is a 86 y.o. male whose past medical history and cardiac risk factors include: History of stroke in 2001, bilateral carotid artery disease, hypertension, hyperlipidemia, right bundle branch block, GERD, recently diagnosed with atrial fibrillation, advanced age.  Paroxysmal atrial fibrillation  Patient was diagnosed with atrial fibrillation while he was hospitalized in May 2021.  CHA2DS2-VASc SCORE is 6 which correlates to 9.8% risk of stroke per year.  Rate control: Toprol-XL.  Thromboembolic prophylaxis: Eliquis, does not endorse any evidence of bleeding.  Long-term oral anticoagulation:  Indication paroxysmal atrial fibrillation.  Most recent blood work from February 17, 2020 reviewed hemoglobin is stable.  Patient does not endorse any evidence of bleeding.  Risks, benefits, and alternatives to oral anticoagulation reemphasized with the patient and his wife at today's visit.  Bilateral carotid artery stenosis:  Since last visit patient had carotid duplex results were reviewed with him and wife at today's visit.  Patient states that he has a follow-up appointment Dr.  Redmond School in November 2021.  She is asked to keep that appointment to discuss further management.  And if possible can also move up the appointment if conducive to PCP schedule.  Currently on aspirin and statin therapy.  Former smoker: Educated on the importance of continued smoking cessation.  FINAL MEDICATION LIST END OF ENCOUNTER: No orders of the defined types were placed in this encounter.   There are no discontinued medications.   Current Outpatient Medications:  .  amLODipine (NORVASC) 2.5 MG tablet, Take 1 tablet (2.5 mg total) by mouth daily., Disp: 90 tablet, Rfl: 3 .  apixaban (ELIQUIS) 5 MG TABS tablet, Take 1 tablet (5 mg total) by mouth 2 (two) times daily., Disp: 180 tablet, Rfl: 3 .  Ascorbic Acid (VITAMIN C PO), Take by mouth., Disp: , Rfl:  .  aspirin 81 MG tablet, Take 81 mg by mouth every morning. , Disp: , Rfl:  .  baclofen (LIORESAL) 10 MG tablet, Take 1 tablet (10 mg total) by mouth 3 (three) times daily as needed for muscle spasms., Disp: 270 tablet, Rfl: 3 .  metoprolol succinate (TOPROL XL) 25 MG 24 hr tablet, Take 0.5 tablets (12.5 mg total) by mouth daily. Hold if systolic blood pressure (top blood pressure number) less than 100 mmHg or heart rate less than 60 bpm (pulse)., Disp: 45 tablet, Rfl: 1 .  Multiple Vitamin (MULTIVITAMIN WITH MINERALS) TABS, Take 1 tablet by mouth every morning. Reported on 09/06/2015, Disp: , Rfl:  .  ondansetron (ZOFRAN) 4 MG tablet, Take 1 tablet (4 mg total) by mouth every 6 (six) hours as needed for nausea., Disp: 20 tablet, Rfl: 0 .  simvastatin (ZOCOR) 20 MG tablet, TAKE 1 TABLET BY MOUTH ONCE DAILY IN THE EVENING (Patient taking differently: Take 20 mg by mouth daily at 6 PM. ), Disp: 90 tablet, Rfl: 3 .  VITAMIN D PO, Take by mouth., Disp: , Rfl:   Current Facility-Administered Medications:  .  botulinum toxin Type A (BOTOX) injection 300 Units, 300 Units, Intramuscular, Once, Marcial Pacas, MD  Orders Placed This Encounter   Procedures  . EKG 12-Lead   --Continue cardiac medications as reconciled in final medication list. --Return in about 6 months (around 08/26/2020) for Follow up, A. fib. Or sooner if needed. --Continue follow-up with your primary care physician regarding the management of your other chronic comorbid conditions.  Patient's questions and concerns were addressed to his satisfaction. He voices understanding of the instructions provided  during this encounter.   This note was created using a voice recognition software as a result there may be grammatical errors inadvertently enclosed that do not reflect the nature of this encounter. Every attempt is made to correct such errors.  Total time spent: 20 minutes.  Rex Kras, [place], [facility]  Pager: [phone] Office: [phone]

## 2020-03-24 ENCOUNTER — Ambulatory Visit (INDEPENDENT_AMBULATORY_CARE_PROVIDER_SITE_OTHER): Payer: Medicare Other

## 2020-03-24 ENCOUNTER — Ambulatory Visit: Payer: Medicare Other

## 2020-03-24 ENCOUNTER — Other Ambulatory Visit: Payer: Self-pay

## 2020-03-24 DIAGNOSIS — Z23 Encounter for immunization: Secondary | ICD-10-CM | POA: Diagnosis not present

## 2020-04-12 ENCOUNTER — Other Ambulatory Visit: Payer: Self-pay | Admitting: *Deleted

## 2020-04-12 DIAGNOSIS — I6523 Occlusion and stenosis of bilateral carotid arteries: Secondary | ICD-10-CM

## 2020-04-20 ENCOUNTER — Other Ambulatory Visit: Payer: Self-pay

## 2020-04-20 ENCOUNTER — Encounter: Payer: Medicare Other | Admitting: Vascular Surgery

## 2020-04-20 ENCOUNTER — Ambulatory Visit (HOSPITAL_COMMUNITY)
Admission: RE | Admit: 2020-04-20 | Discharge: 2020-04-20 | Disposition: A | Discharge status: Home / Self Care | Payer: Medicare Other | Source: Ambulatory Visit | Attending: Vascular Surgery | Admitting: Vascular Surgery

## 2020-04-20 ENCOUNTER — Ambulatory Visit (INDEPENDENT_AMBULATORY_CARE_PROVIDER_SITE_OTHER): Payer: Medicare Other | Admitting: Vascular Surgery

## 2020-04-20 ENCOUNTER — Encounter: Payer: Self-pay | Admitting: Vascular Surgery

## 2020-04-20 VITALS — BP 140/77 | HR 82 | Temp 97.9°F | Resp 20 | Ht 72.0 in | Wt 167.0 lb

## 2020-04-20 DIAGNOSIS — I6523 Occlusion and stenosis of bilateral carotid arteries: Secondary | ICD-10-CM

## 2020-04-20 NOTE — Progress Notes (Signed)
ASSESSMENT & PLAN:  86 y.o. male with severe left carotid artery stenosis (80-99%).  While he is at the threshold for intervention, he is not a good physiologic candidate.  He has residual right hemiplegia from a stroke suffered about 11 years ago.  He is not personally interested in any kind of surgical therapy unless absolutely necessary.  Lengthy discussion with him and his daughter about the risks, benefits, alternatives to carotid intervention.  I discussed specifically that his rate of risk reduction would be 6% over 5 years.  That the benefit was not significant enough to entertain elective intervention.  We will continue medical therapy only.   Recommend:  Complete cessation from all tobacco products. Blood glucose control with goal A1c < 7%. Blood pressure control with goal blood pressure < 140/90 mmHg. Lipid reduction therapy with goal LDL-C <100 mg/dL (<70 if symptomatic from PAD).  Aspirin 81mg  PO QD.  Atorvastatin 40-80mg  PO QD (or other "high intensity" statin therapy).   Follow-up in 6 months with repeat duplex.  CHIEF COMPLAINT:   Carotid artery stenosis  HISTORY:  HISTORY OF PRESENT ILLNESS: Richard Kent is a 86 y.o. male with history significant for stroke with residual right hemiplegia, hypertension, hyperlipidemia, right bundle branch block, GERD, atrial fibrillation.  He has known history of carotid artery stenosis.  He reports no symptoms attributable to symptomatic carotid artery stenosis.  He specifically denies amaurosis, new unilateral weakness, facial droop, dysarthria.  Past Medical History:  Diagnosis Date  . Carotid stenosis   . CVA (cerebral infarction) 2011  . Diverticulosis   . Hyperlipidemia   . Hypertension   . Kidney stone   . Smoker   . Stroke [facility]) 11/17/09    Past Surgical History:  Procedure Laterality Date  . APPENDECTOMY    . HERNIA REPAIR     RIGHT  . HIP ARTHROPLASTY  01/15/2012   Procedure: ARTHROPLASTY BIPOLAR HIP;  Surgeon:  Rozanna Box, MD;  Location: [place];  Service: Orthopedics;  Laterality: Right;    Family History  Problem Relation Age of Onset  . Cancer Mother   . Stroke Mother   . Heart disease Father   . Cervical cancer Sister   . Heart attack Sister     Social History   Socioeconomic History  . Marital status: Married    Spouse name: [name]   . Number of children: 5  . Years of education: Not on file  . Highest education level: Not on file  Occupational History  . Occupation: retired    Fish farm manager: RETIRED  Tobacco Use  . Smoking status: Former Smoker    Packs/day: 2.00    Years: 20.00    Pack years: 40.00    Types: Cigarettes    Quit date: 01/13/1972    Years since quitting: 48.3  . Smokeless tobacco: Never Used  Substance and Sexual Activity  . Alcohol use: No    Comment: Patient quit alcohol 11/17/2009  . Drug use: No  . Sexual activity: Not Currently  Other Topics Concern  . Not on file  Social History Narrative   Patient live at home with his wife. Patient is retired and has five children.    Social Determinants of Health   Financial Resource Strain:   . Difficulty of Paying Living Expenses: Not on file  Food Insecurity:   . Worried About Charity fundraiser in the Last Year: Not on file  . Ran Out of Food in the Last Year:  Not on file  Transportation Needs:   . Lack of Transportation (Medical): Not on file  . Lack of Transportation (Non-Medical): Not on file  Physical Activity:   . Days of Exercise per Week: Not on file  . Minutes of Exercise per Session: Not on file  Stress:   . Feeling of Stress : Not on file  Social Connections:   . Frequency of Communication with Friends and Family: Not on file  . Frequency of Social Gatherings with Friends and Family: Not on file  . Attends Religious Services: Not on file  . Active Member of Clubs or Organizations: Not on file  . Attends Archivist Meetings: Not on file  . Marital Status: Not on file    Intimate Partner Violence:   . Fear of Current or Ex-Partner: Not on file  . Emotionally Abused: Not on file  . Physically Abused: Not on file  . Sexually Abused: Not on file    No Known Allergies  Current Outpatient Medications  Medication Sig Dispense Refill  . amLODipine (NORVASC) 2.5 MG tablet Take 1 tablet (2.5 mg total) by mouth daily. 90 tablet 3  . apixaban (ELIQUIS) 5 MG TABS tablet Take 1 tablet (5 mg total) by mouth 2 (two) times daily. 180 tablet 3  . Ascorbic Acid (VITAMIN C PO) Take by mouth.    Marland Kitchen aspirin 81 MG tablet Take 81 mg by mouth every morning.     . baclofen (LIORESAL) 10 MG tablet Take 1 tablet (10 mg total) by mouth 3 (three) times daily as needed for muscle spasms. 270 tablet 3  . metoprolol succinate (TOPROL XL) 25 MG 24 hr tablet Take 0.5 tablets (12.5 mg total) by mouth daily. Hold if systolic blood pressure (top blood pressure number) less than 100 mmHg or heart rate less than 60 bpm (pulse). 45 tablet 1  . Multiple Vitamin (MULTIVITAMIN WITH MINERALS) TABS Take 1 tablet by mouth every morning. Reported on 09/06/2015    . ondansetron (ZOFRAN) 4 MG tablet Take 1 tablet (4 mg total) by mouth every 6 (six) hours as needed for nausea. 20 tablet 0  . simvastatin (ZOCOR) 20 MG tablet TAKE 1 TABLET BY MOUTH ONCE DAILY IN THE EVENING (Patient taking differently: Take 20 mg by mouth daily at 6 PM. ) 90 tablet 3  . VITAMIN D PO Take by mouth.     Current Facility-Administered Medications  Medication Dose Route Frequency Provider Last Rate Last Admin  . botulinum toxin Type A (BOTOX) injection 300 Units  300 Units Intramuscular Once Marcial Pacas, MD        REVIEW OF SYSTEMS:  [X]  denotes positive finding, [ ]  denotes negative finding Cardiac  Comments:  Chest pain or chest pressure:    Shortness of breath upon exertion:    Short of breath when lying flat:    Irregular heart rhythm:        Vascular    Pain in calf, thigh, or hip brought on by ambulation:    Pain  in feet at night that wakes you up from your sleep:     Blood clot in your veins:    Leg swelling:         Pulmonary    Oxygen at home:    Productive cough:     Wheezing:         Neurologic    Sudden weakness in arms or legs:     Sudden numbness in arms or legs:  Sudden onset of difficulty speaking or slurred speech:    Temporary loss of vision in one eye:     Problems with dizziness:         Gastrointestinal    Blood in stool:     Vomited blood:         Genitourinary    Burning when urinating:     Blood in urine:        Psychiatric    Major depression:         Hematologic    Bleeding problems:    Problems with blood clotting too easily:        Skin    Rashes or ulcers:        Constitutional    Fever or chills:     PHYSICAL EXAM:   Vitals:   04/20/20 1408  BP: 140/77  Pulse: 82  Resp: 20  Temp: 97.9 F (36.6 C)  SpO2: 97%  Weight: 167 lb (75.8 kg)  Height: 6' (1.829 m)    Constitutional: Elderly man in no distress. Appears well nourished.  Neurologic: Confined to wheelchair. Right upper and lower extremity spastic plegia. CN intact. No sensory loss. Psychiatric: Mood and affect symmetric and appropriate. Eyes: No icterus. No conjunctival pallor. Ears, nose, throat: mucous membranes moist. midline trachea.  Cardiac: regular rate and rhythm.  Respiratory: unlabored.  Abdominal: soft, non-tender, non-distended.  Extremity: no edema. no cyanosis. no pallor.  Skin: no gangrene. no ulceration.  Lymphatic: no Stemmer's sign. no palpable lymphadenopathy.  DATA REVIEW:    Most recent CBC CBC Latest Ref Rng & Units 02/17/2020 11/11/2019 09/28/2019  WBC 3.4 - 10.8 x10E3/uL 7.8 7.7 10.5  Hemoglobin 13.0 - 17.7 g/dL 16.3 15.4 12.4(L)  Hematocrit 37.5 - 51.0 % 45.1 45.2 35.1(L)  Platelets 150 - 450 x10E3/uL 224 246 219     Most recent CMP CMP Latest Ref Rng & Units 02/17/2020 11/11/2019 09/28/2019  Glucose 65 - 99 mg/dL 106(H) 94 110(H)  BUN 8 - 27 mg/dL 15 12  23   Creatinine 0.76 - 1.27 mg/dL 0.90 0.92 0.95  Sodium 134 - 144 mmol/L 141 142 136  Potassium 3.5 - 5.2 mmol/L 4.5 4.2 3.5  Chloride 96 - 106 mmol/L 104 105 106  CO2 20 - 29 mmol/L 23 26 22   Calcium 8.6 - 10.2 mg/dL 8.9 8.9 7.8(L)  Total Protein 6.0 - 8.5 g/dL - 6.7 -  Total Bilirubin 0.0 - 1.2 mg/dL - 0.4 -  Alkaline Phos 48 - 121 IU/L - 69 -  AST 0 - 40 IU/L - 20 -  ALT 0 - 44 IU/L - 29 -    Renal function CrCl cannot be calculated (Patient's most recent lab result is older than the maximum 21 days allowed.).  Hgb A1c MFr Bld (%)  Date Value  11/18/2009 (H)   6.4 (NOTE)                                                                       According to the ADA Clinical Practice Recommendations for 2011, when HbA1c is used as a screening test:   >=6.5%   Diagnostic of Diabetes Mellitus           (if abnormal result  is  confirmed)  5.7-6.4%   Increased risk of developing Diabetes Mellitus  References:Diagnosis and Classification of Diabetes Mellitus,Diabetes IFOY,7741,28(NOMVE 1):S62-S69 and Standards of Medical Care in         Diabetes - 2011,Diabetes HMCN,4709,62  (Suppl 1):S11-S61.    LDL Chol Calc (NIH)  Date Value Ref Range Status  06/03/2019 69 0 - 99 mg/dL Final     Vascular Imaging: Carotid Arterial Duplex Study   Indications:              Carotid artery disease.  Risk Factors:             Hypertension, hyperlipidemia.  Comparison Study:           02/12/2020: Rt ICA >69%; Lt ICA  near                     occlusion.  Pre-Surgical Evaluation & Surgical   Stenosis at left ICA only. Anatomy  on  Correlation              the left is within normal  limits.Left                     bifurcation is located near the  Hyoid                     Notch.   Performing Technologist: Ivan Croft     Examination Guidelines: A complete evaluation includes B-mode  imaging,  spectral  Doppler, color Doppler, and power Doppler as needed of all accessible  portions  of each vessel. Bilateral testing is considered an integral part of a  complete  examination. Limited examinations for reoccurring indications may be  performed  as noted.     Right Carotid Findings:  +----------+-------+-------+--------+---------------------------------+----  ----+       PSV  EDV  StenosisPlaque Description         Comments       cm/s  cm/s                              +----------+-------+-------+--------+---------------------------------+----  ----+  CCA Prox 76   16                               +----------+-------+-------+--------+---------------------------------+----  ----+  CCA Mid  83   24       heterogenous and irregular           +----------+-------+-------+--------+---------------------------------+----  ----+  CCA Distal178  35       heterogenous, irregular and                           calcific                    +----------+-------+-------+--------+---------------------------------+----  ----+  ICA Prox 262  66   60-79% heterogenous, irregular and                           calcific                    +----------+-------+-------+--------+---------------------------------+----  ----+  ICA Mid  170  37       heterogenous                  +----------+-------+-------+--------+---------------------------------+----  ----+  ICA Distal178  38       heterogenous                  +----------+-------+-------+--------+---------------------------------+----  ----+  ECA    306  45   >50%  heterogenous, irregular and                            calcific                    +----------+-------+-------+--------+---------------------------------+----  ----+   +----------+--------+-------+----------------+-------------------+       PSV cm/sEDV cmsDescribe    Arm Pressure (mmHG)  +----------+--------+-------+----------------+-------------------+  Subclavian102       Multiphasic, WNL            +----------+--------+-------+----------------+-------------------+   +---------+--------+--+--------+--+---------+  VertebralPSV cm/s54EDV cm/s11Antegrade  +---------+--------+--+--------+--+---------+      Left Carotid Findings:  +----------+-------+-------+--------+---------------------------------+----  ----+       PSV  EDV  StenosisPlaque Description         Comments       cm/s  cm/s                              +----------+-------+-------+--------+---------------------------------+----  ----+  CCA Prox 66   11       smooth                     +----------+-------+-------+--------+---------------------------------+----  ----+  CCA Mid  73   11       smooth                     +----------+-------+-------+--------+---------------------------------+----  ----+  CCA Distal67   11       heterogenous, irregular and                           calcific                    +----------+-------+-------+--------+---------------------------------+----  ----+  ICA Prox 466  121  80-99% calcific, irregular and                             heterogenous                  +----------+-------+-------+--------+---------------------------------+----  ----+  ICA Mid  23   10       heterogenous                   +----------+-------+-------+--------+---------------------------------+----  ----+  ICA Distal27   12       heterogenous                  +----------+-------+-------+--------+---------------------------------+----  ----+  ECA    244  33   >50%  heterogenous, calcific and                            irregular                   +----------+-------+-------+--------+---------------------------------+----  ----+   +----------+--------+--------+----------------+-------------------+       PSV cm/sEDV cm/sDescribe    Arm Pressure (mmHG)  +----------+--------+--------+----------------+-------------------+  [id number]   9    Multiphasic, WNL            +----------+--------+--------+----------------+-------------------+   +---------+--------+--+--------+-+---------+  VertebralPSV  cm/s58EDV cm/s8Antegrade  +---------+--------+--+--------+-+---------+         Summary:    Right Carotid: Velocities in the right ICA are consistent with a 60-79%         stenosis. The ECA appears >50% stenosed.   Left Carotid: Velocities in the left ICA are consistent with a 80-99%  stenosis.        The ECA appears >50% stenosed.   Vertebrals: Bilateral vertebral arteries demonstrate antegrade flow.   *See table(s) above for measurements and observations  Yevonne Aline. Stanford Breed, MD [facility] Phone Number: [phone] 04/20/2020 2:46 PM

## 2020-07-09 ENCOUNTER — Other Ambulatory Visit: Payer: Self-pay

## 2020-07-09 DIAGNOSIS — I480 Paroxysmal atrial fibrillation: Secondary | ICD-10-CM

## 2020-07-09 MED ORDER — METOPROLOL SUCCINATE ER 25 MG PO TB24: 12.5000 mg | ORAL_TABLET | Freq: Every day | ORAL | 1 refills | Status: DC

## 2020-08-26 ENCOUNTER — Ambulatory Visit: Payer: Medicare Other | Admitting: Cardiology

## 2020-09-07 ENCOUNTER — Other Ambulatory Visit: Payer: Self-pay

## 2020-09-07 ENCOUNTER — Encounter: Payer: Self-pay | Admitting: Cardiology

## 2020-09-07 ENCOUNTER — Ambulatory Visit: Payer: Medicare Other | Admitting: Cardiology

## 2020-09-07 VITALS — BP 149/81 | HR 75 | Resp 17 | Ht 72.0 in | Wt 167.0 lb

## 2020-09-07 DIAGNOSIS — Z7901 Long term (current) use of anticoagulants: Secondary | ICD-10-CM

## 2020-09-07 DIAGNOSIS — I10 Essential (primary) hypertension: Secondary | ICD-10-CM

## 2020-09-07 DIAGNOSIS — I480 Paroxysmal atrial fibrillation: Secondary | ICD-10-CM | POA: Diagnosis not present

## 2020-09-07 DIAGNOSIS — Z87891 Personal history of nicotine dependence: Secondary | ICD-10-CM

## 2020-09-07 DIAGNOSIS — I6523 Occlusion and stenosis of bilateral carotid arteries: Secondary | ICD-10-CM | POA: Diagnosis not present

## 2020-09-07 NOTE — Progress Notes (Signed)
ID:  Richard Kent, DOB 06/10/1928, MRN [id number]  PCP:  Richard Lung, MD  Cardiologist:  Richard Kras, DO, Richard Kent (established care 11/18/2019) Former Cardiology Providers:  Richard Margarita, MD and Richard Blanks, MD  Date: 09/07/20 Last Office Visit: 02/26/2020  Chief Complaint  Patient presents with  . Atrial Fibrillation  . Hypertension  . Follow-up    6 months    HPI  Richard Kent is a 87 y.o. male who presents to the office with a chief complaint of "53-month follow-up for atrial fibrillation management." He is accompanied by his wife at today's office visit.  Patient's past medical history and cardiovascular risk factors include: History of stroke in 2011, bilateral carotid artery disease, hypertension, hyperlipidemia, right bundle branch block, GERD, recently diagnosed with atrial fibrillation, advanced age.  Patient was referred to the office at the request of his primary care provider for evaluation and management of atrial fibrillation back in June 2021.  Atrial fibrillation, paroxysmal: Patient was hospitalized in May 2021 for GI symptoms and during that hospitalization he had an EKG which showed atrial fibrillation.  At the time of discharge he was started on oral anticoagulation for thromboembolic prophylaxis.  And was asked to follow-up with cardiology as outpatient for further management.  Patient did not undergo outpatient telemetry and was noted to have paroxysmal atrial fibrillation and therefore given his age and other chronic comorbid conditions the shared decision was to control his ventricular rate and provide thromboembolic prophylaxis.  Patient is tolerating Toprol-XL well without any significant intolerances.  His ventricular rate at home is also very well controlled.  He is currently on Eliquis and tolerating the medication well and does not endorse any evidence of bleeding.  Since last office visit he has not been hospitalized for cardiovascular  symptoms.  He denies any chest pain or anginal equivalent.  He is overall euvolemic and not in congestive heart failure.  FUNCTIONAL STATUS: No structured exercise or daily routine. He ambulates with cane and walker while at home. Functional status is limited due to prior stroke with right sided residual deficits.   ALLERGIES: No Known Allergies  MEDICATION LIST PRIOR TO VISIT: Current Meds  Medication Sig  . amLODipine (NORVASC) 2.5 MG tablet Take 1 tablet (2.5 mg total) by mouth daily.  Marland Kitchen apixaban (ELIQUIS) 5 MG TABS tablet Take 1 tablet (5 mg total) by mouth 2 (two) times daily.  . Ascorbic Acid (VITAMIN C PO) Take by mouth.  Marland Kitchen aspirin 81 MG tablet Take 81 mg by mouth every morning.  . baclofen (LIORESAL) 10 MG tablet Take 1 tablet (10 mg total) by mouth 3 (three) times daily as needed for muscle spasms.  . metoprolol succinate (TOPROL XL) 25 MG 24 hr tablet Take 0.5 tablets (12.5 mg total) by mouth daily. Hold if systolic blood pressure (top blood pressure number) less than 100 mmHg or heart rate less than 60 bpm (pulse).  . Multiple Vitamin (MULTIVITAMIN WITH MINERALS) TABS Take 1 tablet by mouth every morning. Reported on 09/06/2015  . ondansetron (ZOFRAN) 4 MG tablet Take 1 tablet (4 mg total) by mouth every 6 (six) hours as needed for nausea.  . simvastatin (ZOCOR) 20 MG tablet TAKE 1 TABLET BY MOUTH ONCE DAILY IN THE EVENING (Patient taking differently: Take 20 mg by mouth daily at 6 PM.)  . VITAMIN D PO Take by mouth.   Current Facility-Administered Medications for the 09/07/20 encounter (Office Visit) with Richard Kras, DO  Medication  . botulinum  toxin Type A (BOTOX) injection 300 Units     PAST MEDICAL HISTORY: Past Medical History:  Diagnosis Date  . Carotid stenosis   . CVA (cerebral infarction) 2011  . Diverticulosis   . Hyperlipidemia   . Hypertension   . Kidney stone   . Smoker   . Stroke [facility]) 11/17/09    PAST SURGICAL HISTORY: Past Surgical History:   Procedure Laterality Date  . APPENDECTOMY    . HERNIA REPAIR     RIGHT  . HIP ARTHROPLASTY  01/15/2012   Procedure: ARTHROPLASTY BIPOLAR HIP;  Surgeon: Richard Box, MD;  Location: [facility];  Service: Orthopedics;  Laterality: Right;    FAMILY HISTORY: The patient family history includes Cancer in his mother; Cervical cancer in his sister; Heart attack in his sister; Heart disease in his father; Stroke in his mother.  SOCIAL HISTORY:  The patient  reports that he quit smoking about 48 years ago. His smoking use included cigarettes. He has a 40.00 pack-year smoking history. He has never used smokeless tobacco. He reports that he does not drink alcohol and does not use drugs.  REVIEW OF SYSTEMS: Review of Systems  Constitutional: Negative for chills, diaphoresis and fever.  HENT: Negative for hoarse voice and nosebleeds.   Eyes: Negative for discharge, double vision and pain.  Cardiovascular: Negative for chest pain, claudication, dyspnea on exertion, leg swelling, near-syncope, orthopnea, palpitations, paroxysmal nocturnal dyspnea and syncope.  Respiratory: Negative for hemoptysis and shortness of breath.   Musculoskeletal: Negative for muscle cramps and myalgias.  Gastrointestinal: Negative for abdominal pain, constipation, diarrhea, hematemesis, hematochezia, melena, nausea and vomiting.  Neurological: Negative for dizziness and light-headedness.    PHYSICAL EXAM: Vitals with BMI 09/07/2020 04/20/2020 02/26/2020  Height 6\' 0"  6\' 0"  6\' 0"   Weight 167 lbs 167 lbs 167 lbs  BMI 22.64 85.63 14.97  Systolic 026 378 588  Diastolic 81 77 72  Pulse 75 82 76   CONSTITUTIONAL: Appears older than stated age, hemodynamically stable, no acute distress.  Sitting upright in his wheelchair.  SKIN: Skin is warm and dry. No rash noted. No cyanosis. No pallor. No jaundice HEAD: Normocephalic and atraumatic.  EYES: No scleral icterus MOUTH/THROAT: Moist oral membranes.  NECK: No JVD present. No  thyromegaly noted.  Right-sided carotid bruit LYMPHATIC: No visible cervical adenopathy.  CHEST Normal respiratory effort. No intercostal retractions  LUNGS: Clear to auscultation bilaterally.  No stridor. No wheezes. No rales.  CARDIOVASCULAR: Irregularly irregular, variable S1-S2 no murmurs rubs or gallops appreciated ABDOMINAL: Soft, nontender, nondistended positive bowel sounds in all 4 quadrants, no apparent ascites.  EXTREMITIES: No peripheral edema.  Brace in the right lower extremity.  HEMATOLOGIC: No significant bruising NEUROLOGIC: Oriented to person, place, and time. Nonfocal. Normal muscle tone.  Decrease strength in right upper and lower extremity.  PSYCHIATRIC: Normal mood and affect. Normal behavior. Cooperative  RADIOLOGY: MR Head: 06/12/2010: No antegrade flow is seen in the left ICA.  The right ICA supplies the right middle cerebral artery territory and both anterior cerebral artery territories due to a patent anterior communicating artery. There is a mild stenosis at the anterior cerebral artery origin. There is reconstituted flow in the left middle cerebral artery territory, poorly demonstrated by MRA but better shown on CTA. Distal left vertebral artery narrowing estimated at 50%.  This is the nondominant vertebral.  CTA head and neck:  05/2010: Complete occlusion left internal carotid artery. 50% stenosis right internal carotid artery with calcific plaque. 75 % stenosis on  the left vertebral origin.  CARDIAC DATABASE: EKG:  10/22/2019: Atrial fibrillation, 84 bpm, left axis deviation, right bundle branch block, no underlying injury pattern.  02/26/2020: Sinus  Rhythm, 73 bpm, first-degree AV block, right bundle branch block, ST-T changes most likely secondary to RBBB, without underlying injury pattern. 09/07/2020: Atrial fibrillation, 66 bpm, RBBB, left axis, left anterior fascicular block.  Echocardiogram: 09/28/2019: LVEF 60 to 65%, no regional wall motion abnormalities,  mild LVH, indeterminate diastolic parameters, mildly dilated left atrium, no significant valvular heart disease.  Stress Testing: Lexiscan Tetrofosmin stress test 11/05/2019: Lexiscan nuclear stress test performed using 1-day protocol. Stress EKG is non-diagnostic, as this is pharmacological stress test. Normal myocardial perfusion. Stress LVEF 70%. Low risk study.    Heart Catheterization: None  Carotid duplex: 04/20/2020:  Right Carotid: Velocities in the right ICA are consistent with a 60-79% stenosis. The ECA appears >50% stenosed.  Left Carotid: Velocities in the left ICA are consistent with a 80-99% stenosis. The ECA appears >50% stenosed.  Vertebrals: Bilateral vertebral arteries demonstrate antegrade flow.   14 - day [facility] Telemetry. Enrollment Period: 10/22/2019 - 11/04/2019 Predominant rhythm normal sinus followed by atrial fibrillation.  Normal sinus rhythm ranging from 47 - 59 bpm, average heart rate 62 bpm. Atrial Fibrillation ranging from 60 - 111 bpm, average heart rate 77 bpm. Atrial fibrillation burden 8% during monitoring period (approx 1day and 6 min.).  No pauses greater than or equal to 3 seconds. Total supraventricular ectopic burden N/A. Total ventricular ectopic burden <1%.  Heart rate < 60 bpm for 50% of the recording. Heart rate > 100 bpm for 0% of the recording.    LABORATORY DATA: CBC Latest Ref Rng & Units 02/17/2020 11/11/2019 09/28/2019  WBC 3.4 - 10.8 x10E3/uL 7.8 7.7 10.5  Hemoglobin 13.0 - 17.7 g/dL 16.3 15.4 12.4(L)  Hematocrit 37.5 - 51.0 % 45.1 45.2 35.1(L)  Platelets 150 - 450 x10E3/uL 224 246 219    CMP Latest Ref Rng & Units 02/17/2020 11/11/2019 09/28/2019  Glucose 65 - 99 mg/dL 106(H) 94 110(H)  BUN 8 - 27 mg/dL 15 12 23   Creatinine 0.76 - 1.27 mg/dL 0.90 0.92 0.95  Sodium 134 - 144 mmol/L 141 142 136  Potassium 3.5 - 5.2 mmol/L 4.5 4.2 3.5  Chloride 96 - 106 mmol/L 104 105 106  CO2 20 - 29 mmol/L 23 26 22   Calcium 8.6  - 10.2 mg/dL 8.9 8.9 7.8(L)  Total Protein 6.0 - 8.5 g/dL - 6.7 -  Total Bilirubin 0.0 - 1.2 mg/dL - 0.4 -  Alkaline Phos 48 - 121 IU/L - 69 -  AST 0 - 40 IU/L - 20 -  ALT 0 - 44 IU/L - 29 -    Lipid Panel     Component Value Date/Time   CHOL 134 06/03/2019 1348   TRIG 113 06/03/2019 1348   HDL 44 06/03/2019 1348   CHOLHDL 3.0 06/03/2019 1348   CHOLHDL 2.8 01/09/2017 1004   VLDL 15 01/09/2017 1004   LDLCALC 69 06/03/2019 1348   LABVLDL 21 06/03/2019 1348   No components found for: NTPROBNP Lab Results  Component Value Date   TSH 0.544 09/27/2019   IMPRESSION:    ICD-10-CM   1. Paroxysmal atrial fibrillation (HCC)  I48.0 EKG 12-Lead    Hemoglobin and hematocrit, blood    Basic metabolic panel  2. Carotid stenosis, bilateral  I65.23   3. Long term (current) use of anticoagulants  Z79.01 Hemoglobin and hematocrit, blood  Basic metabolic panel  4. Essential hypertension  I10   5. Former smoker  Z87.891      RECOMMENDATIONS: Richard Kent is a 87 y.o. male whose past medical history and cardiac risk factors include: History of stroke in 2001, bilateral carotid artery disease, hypertension, hyperlipidemia, right bundle branch block, GERD, recently diagnosed with atrial fibrillation, advanced age.  Paroxysmal atrial fibrillation  Patient was diagnosed with atrial fibrillation while he was hospitalized in May 2021.  CHA2DS2-VASc SCORE is 6 which correlates to 9.8% risk of stroke per year.  EKG today shows atrial fibrillation with controlled ventricular rate.  In the past patient had a 14-day monitor which noted AF burden of approximately 8% during the monitoring period.  Rate control: Toprol-XL.  Thromboembolic prophylaxis: Eliquis, does not endorse any evidence of bleeding.  Patient and wife would like to continue current medical therapy and they are not interested in additional testing as he is currently asymptomatic.  Continue to monitor.  Long-term oral  anticoagulation:  Indication paroxysmal atrial fibrillation.  Most recent blood work from February 17, 2020 reviewed hemoglobin is stable.  Patient does not endorse any evidence of bleeding.  Risks, benefits, and alternatives to oral anticoagulation reemphasized with the patient and his wife at today's visit.  We will check a BMP and hemoglobin hematocrit.  Bilateral carotid artery stenosis:  Reviewed the last carotid duplex from November 2021 results noted above.  Discussed the findings with the patient and his wife.  Currently follows with vascular surgery, will defer management to them.  Former smoker: Educated on the importance of continued smoking cessation.  FINAL MEDICATION LIST END OF ENCOUNTER: No orders of the defined types were placed in this encounter.   There are no discontinued medications.   Current Outpatient Medications:  .  amLODipine (NORVASC) 2.5 MG tablet, Take 1 tablet (2.5 mg total) by mouth daily., Disp: 90 tablet, Rfl: 3 .  apixaban (ELIQUIS) 5 MG TABS tablet, Take 1 tablet (5 mg total) by mouth 2 (two) times daily., Disp: 180 tablet, Rfl: 3 .  Ascorbic Acid (VITAMIN C PO), Take by mouth., Disp: , Rfl:  .  aspirin 81 MG tablet, Take 81 mg by mouth every morning., Disp: , Rfl:  .  baclofen (LIORESAL) 10 MG tablet, Take 1 tablet (10 mg total) by mouth 3 (three) times daily as needed for muscle spasms., Disp: 270 tablet, Rfl: 3 .  metoprolol succinate (TOPROL XL) 25 MG 24 hr tablet, Take 0.5 tablets (12.5 mg total) by mouth daily. Hold if systolic blood pressure (top blood pressure number) less than 100 mmHg or heart rate less than 60 bpm (pulse)., Disp: 45 tablet, Rfl: 1 .  Multiple Vitamin (MULTIVITAMIN WITH MINERALS) TABS, Take 1 tablet by mouth every morning. Reported on 09/06/2015, Disp: , Rfl:  .  ondansetron (ZOFRAN) 4 MG tablet, Take 1 tablet (4 mg total) by mouth every 6 (six) hours as needed for nausea., Disp: 20 tablet, Rfl: 0 .  simvastatin (ZOCOR)  20 MG tablet, TAKE 1 TABLET BY MOUTH ONCE DAILY IN THE EVENING (Patient taking differently: Take 20 mg by mouth daily at 6 PM.), Disp: 90 tablet, Rfl: 3 .  VITAMIN D PO, Take by mouth., Disp: , Rfl:   Current Facility-Administered Medications:  .  botulinum toxin Type A (BOTOX) injection 300 Units, 300 Units, Intramuscular, Once, Marcial Pacas, MD  Orders Placed This Encounter  Procedures  . Hemoglobin and hematocrit, blood  . Basic metabolic panel  . EKG 12-Lead   --  Continue cardiac medications as reconciled in final medication list. --Return in about 6 months (around 03/09/2021) for Follow up, A. fib. Or sooner if needed. --Continue follow-up with your primary care physician regarding the management of your other chronic comorbid conditions.  Patient's questions and concerns were addressed to his satisfaction. He voices understanding of the instructions provided during this encounter.   This note was created using a voice recognition software as a result there may be grammatical errors inadvertently enclosed that do not reflect the nature of this encounter. Every attempt is made to correct such errors.  Richard Kent, [place], [facility]  Pager: [phone] Office: [phone]

## 2020-09-08 ENCOUNTER — Encounter: Payer: Self-pay | Admitting: Cardiology

## 2020-09-11 ENCOUNTER — Other Ambulatory Visit: Payer: Self-pay | Admitting: Family Medicine

## 2020-09-11 DIAGNOSIS — E785 Hyperlipidemia, unspecified: Secondary | ICD-10-CM

## 2020-12-07 ENCOUNTER — Telehealth: Payer: Self-pay

## 2020-12-07 NOTE — Telephone Encounter (Signed)
Pt wife was worried about pt right pain. It has been going on since last night . She will give him a baclofen to see if this improves. Please advise of anything else pt should do. [place]

## 2020-12-09 ENCOUNTER — Telehealth: Payer: Self-pay | Admitting: Family Medicine

## 2020-12-09 ENCOUNTER — Other Ambulatory Visit: Payer: Self-pay | Admitting: Family Medicine

## 2020-12-09 DIAGNOSIS — E785 Hyperlipidemia, unspecified: Secondary | ICD-10-CM

## 2020-12-09 NOTE — Telephone Encounter (Signed)
Pt has an appt in 2023

## 2020-12-09 NOTE — Telephone Encounter (Signed)
Pt does not see neurology at all. Pt's wife just wants to know can she take him off of this medication since hes only been on it for 2 days and it won't cause no harm

## 2020-12-09 NOTE — Telephone Encounter (Signed)
Pts wife called and stated that the Baclofen has made him lethargic and when he talks it doesn't make sense. She said it helped him for a little while but he just started having these side effects. She gave him 1 last night but stated she did not want to give him any more and wanted to see if this was ok?

## 2020-12-10 NOTE — Telephone Encounter (Signed)
Wife was notified.

## 2020-12-19 ENCOUNTER — Other Ambulatory Visit: Payer: Self-pay | Admitting: Family Medicine

## 2021-02-14 ENCOUNTER — Other Ambulatory Visit: Payer: Self-pay

## 2021-02-14 DIAGNOSIS — I480 Paroxysmal atrial fibrillation: Secondary | ICD-10-CM

## 2021-02-14 MED ORDER — METOPROLOL SUCCINATE ER 25 MG PO TB24: 12.5000 mg | ORAL_TABLET | Freq: Every day | ORAL | 1 refills | Status: DC

## 2021-03-07 ENCOUNTER — Other Ambulatory Visit: Payer: Self-pay | Admitting: Family Medicine

## 2021-03-10 ENCOUNTER — Ambulatory Visit: Payer: Medicare Other | Admitting: Cardiology

## 2021-03-21 ENCOUNTER — Other Ambulatory Visit: Payer: Self-pay | Admitting: Family Medicine

## 2021-03-24 ENCOUNTER — Other Ambulatory Visit (INDEPENDENT_AMBULATORY_CARE_PROVIDER_SITE_OTHER): Payer: Medicare Other

## 2021-03-24 ENCOUNTER — Encounter: Payer: Self-pay | Admitting: Cardiology

## 2021-03-24 ENCOUNTER — Other Ambulatory Visit: Payer: Self-pay

## 2021-03-24 ENCOUNTER — Ambulatory Visit: Payer: Medicare Other | Admitting: Cardiology

## 2021-03-24 VITALS — BP 125/79 | HR 67 | Temp 98.6°F | Resp 16 | Ht 72.0 in | Wt 165.0 lb

## 2021-03-24 DIAGNOSIS — Z23 Encounter for immunization: Secondary | ICD-10-CM | POA: Diagnosis not present

## 2021-03-24 DIAGNOSIS — Z8673 Personal history of transient ischemic attack (TIA), and cerebral infarction without residual deficits: Secondary | ICD-10-CM

## 2021-03-24 DIAGNOSIS — Z87891 Personal history of nicotine dependence: Secondary | ICD-10-CM | POA: Diagnosis not present

## 2021-03-24 DIAGNOSIS — Z7901 Long term (current) use of anticoagulants: Secondary | ICD-10-CM

## 2021-03-24 DIAGNOSIS — I4510 Unspecified right bundle-branch block: Secondary | ICD-10-CM | POA: Diagnosis not present

## 2021-03-24 DIAGNOSIS — E782 Mixed hyperlipidemia: Secondary | ICD-10-CM | POA: Diagnosis not present

## 2021-03-24 DIAGNOSIS — I10 Essential (primary) hypertension: Secondary | ICD-10-CM | POA: Diagnosis not present

## 2021-03-24 DIAGNOSIS — I4819 Other persistent atrial fibrillation: Secondary | ICD-10-CM

## 2021-03-24 DIAGNOSIS — I6523 Occlusion and stenosis of bilateral carotid arteries: Secondary | ICD-10-CM

## 2021-03-24 NOTE — Progress Notes (Signed)
ID:  Richard Kent, DOB Feb 24, 1929, MRN [id number]  PCP:  Denita Lung, MD  Cardiologist:  Rex Kras, DO, [facility] (established care 11/18/2019) Former Cardiology Providers:  Sueanne Margarita, MD and Burnell Blanks, MD  Date: 03/24/21 Last Office Visit: 09/07/2020  Chief Complaint  Patient presents with   Atrial Fibrillation   Follow-up    6 MONTH    HPI  Richard Kent is a 87 y.o. male who presents to the office with a chief complaint of "64-month follow-up for atrial fibrillation management." He is accompanied by his wife at today's office visit.  Patient's past medical history and cardiovascular risk factors include: History of stroke in 2011 with residual right-sided deficits, bilateral carotid artery disease, hypertension, hyperlipidemia, right bundle branch block, GERD, recently diagnosed with atrial fibrillation, advanced age.  Patient was referred to the office at the request of his primary care provider for evaluation and management of atrial fibrillation back in June 2021.  In May 2021 patient went to the ED for GI symptoms and was noted to be in atrial fibrillation.  He was started on oral anticoagulation for thromboembolic prophylaxis prior to discharge and then referred to cardiology for further evaluation and management.  Patient underwent an extended Holter monitor which noted paroxysmal atrial fibrillation with an A. fib burden of approximately 8%.  The shared decision was to uptitrate AV nodal blocking agents for rate control and continue oral anticoagulation for thromboembolic prophylaxis given his CHA2DS2-VASc score  Patient has been following up in the clinic every 6 months to make sure that he is not noticing any signs of bleeding and periodically hemoglobin and renal function also is being checked.  Since last visit he denies any chest pain or shortness of breath at rest or with effort related activities.  He denies any hospitalizations or urgent care visits  for cardiovascular symptoms.  FUNCTIONAL STATUS: No structured exercise or daily routine. He ambulates with cane and walker while at home. Functional status is limited due to prior stroke with right sided residual deficits.   ALLERGIES: No Known Allergies  MEDICATION LIST PRIOR TO VISIT: Current Meds  Medication Sig   amLODipine (NORVASC) 2.5 MG tablet Take 1 tablet by mouth once daily   Ascorbic Acid (VITAMIN C PO) Take by mouth.   aspirin 81 MG tablet Take 81 mg by mouth every morning.   ELIQUIS 5 MG TABS tablet Take 1 tablet by mouth twice daily   metoprolol succinate (TOPROL XL) 25 MG 24 hr tablet Take 0.5 tablets (12.5 mg total) by mouth daily. Hold if systolic blood pressure (top blood pressure number) less than 100 mmHg or heart rate less than 60 bpm (pulse).   Multiple Vitamin (MULTIVITAMIN WITH MINERALS) TABS Take 1 tablet by mouth every morning. Reported on 09/06/2015   simvastatin (ZOCOR) 20 MG tablet TAKE 1 TABLET BY MOUTH ONCE DAILY IN THE EVENING   VITAMIN D PO Take by mouth.   Current Facility-Administered Medications for the 03/24/21 encounter (Office Visit) with Rex Kras, DO  Medication   botulinum toxin Type A (BOTOX) injection 300 Units     PAST MEDICAL HISTORY: Past Medical History:  Diagnosis Date   Carotid stenosis    CVA (cerebral infarction) 2011   Diverticulosis    Hyperlipidemia    Hypertension    Kidney stone    Smoker    Stroke ([place]) 11/17/09    PAST SURGICAL HISTORY: Past Surgical History:  Procedure Laterality Date   APPENDECTOMY  HERNIA REPAIR     RIGHT   HIP ARTHROPLASTY  01/15/2012   Procedure: ARTHROPLASTY BIPOLAR HIP;  Surgeon: Rozanna Box, MD;  Location: [place];  Service: Orthopedics;  Laterality: Right;    FAMILY HISTORY: The patient family history includes Cancer in his mother; Cervical cancer in his sister; Heart attack in his sister; Heart disease in his father; Stroke in his mother.  SOCIAL HISTORY:  The patient   reports that he quit smoking about 49 years ago. His smoking use included cigarettes. He has a 40.00 pack-year smoking history. He has never used smokeless tobacco. He reports that he does not drink alcohol and does not use drugs.  REVIEW OF SYSTEMS: Review of Systems  Constitutional: Negative for chills, diaphoresis and fever.  HENT:  Negative for hoarse voice and nosebleeds.   Eyes:  Negative for discharge, double vision and pain.  Cardiovascular:  Negative for chest pain, claudication, dyspnea on exertion, leg swelling, near-syncope, orthopnea, palpitations, paroxysmal nocturnal dyspnea and syncope.  Respiratory:  Negative for hemoptysis and shortness of breath.   Musculoskeletal:  Negative for muscle cramps and myalgias.  Gastrointestinal:  Negative for abdominal pain, constipation, diarrhea, hematemesis, hematochezia, melena, nausea and vomiting.  Neurological:  Negative for dizziness and light-headedness.   PHYSICAL EXAM: Vitals with BMI 03/24/2021 09/07/2020 04/20/2020  Height 6\' 0"  6\' 0"  6\' 0"   Weight - 167 lbs 167 lbs  BMI - 16.10 96.04  Systolic 540 981 191  Diastolic 79 81 77  Pulse 67 75 82   CONSTITUTIONAL: Appears older than stated age, hemodynamically stable, no acute distress.  Sitting upright in his wheelchair.  SKIN: Skin is warm and dry. No rash noted. No cyanosis. No pallor. No jaundice HEAD: Normocephalic and atraumatic.  EYES: No scleral icterus MOUTH/THROAT: Moist oral membranes.  NECK: No JVD present. No thyromegaly noted.  Right-sided carotid bruit LYMPHATIC: No visible cervical adenopathy.  CHEST Normal respiratory effort. No intercostal retractions  LUNGS: Clear to auscultation bilaterally.  No stridor. No wheezes. No rales.  CARDIOVASCULAR: Irregularly irregular, variable S1-S2 no murmurs rubs or gallops appreciated ABDOMINAL: Soft, nontender, nondistended positive bowel sounds in all 4 quadrants, no apparent ascites.  EXTREMITIES: No peripheral edema.   Brace in the right lower extremity.  HEMATOLOGIC: No significant bruising NEUROLOGIC: Oriented to person, place, and time. Nonfocal. Normal muscle tone.  Decrease strength in right upper and lower extremity.  PSYCHIATRIC: Normal mood and affect. Normal behavior. Cooperative  RADIOLOGY: MR Head: 06/12/2010: No antegrade flow is seen in the left ICA.  The right ICA supplies the right middle cerebral artery territory and both anterior cerebral artery territories due to a patent anterior communicating artery. There is a mild stenosis at the anterior cerebral artery origin. There is reconstituted flow in the left middle cerebral artery territory, poorly demonstrated by MRA but better shown on CTA. Distal left vertebral artery narrowing estimated at 50%.  This is the nondominant vertebral.  CTA head and neck:  05/2010: Complete occlusion left internal carotid artery. 50% stenosis right internal carotid artery with calcific plaque. 75 % stenosis on the left vertebral origin.  CARDIAC DATABASE: EKG:  10/22/2019: Atrial fibrillation, 84 bpm, left axis deviation, right bundle branch block, no underlying injury pattern.  02/26/2020: Sinus  Rhythm, 73 bpm, first-degree AV block, right bundle branch block, ST-T changes most likely secondary to RBBB, without underlying injury pattern. 03/24/2021: Atrial fibrillation, 65 bpm, right bundle branch block.  Echocardiogram: 09/28/2019: LVEF 60 to 65%, no regional wall motion abnormalities, mild  LVH, indeterminate diastolic parameters, mildly dilated left atrium, no significant valvular heart disease.  Stress Testing: Lexiscan Tetrofosmin stress test 11/05/2019: Lexiscan nuclear stress test performed using 1-day protocol. Stress EKG is non-diagnostic, as this is pharmacological stress test. Normal myocardial perfusion. Stress LVEF 70%. Low risk study.    Heart Catheterization: None  Carotid duplex: 04/20/2020:  Right Carotid: Velocities in the right ICA are  consistent with a 60-79% stenosis. The ECA appears >50% stenosed.  Left Carotid: Velocities in the left ICA are consistent with a 80-99% stenosis. The ECA appears >50% stenosed.  Vertebrals: Bilateral vertebral arteries demonstrate antegrade flow.   14 - day [facility]iac Ambulatory Telemetry. Enrollment Period: 10/22/2019 - 11/04/2019 Predominant rhythm normal sinus followed by atrial fibrillation.  Normal sinus rhythm ranging from 47 - 59 bpm, average heart rate 62 bpm. Atrial Fibrillation ranging from 60 - 111 bpm, average heart rate 77 bpm. Atrial fibrillation burden 8% during monitoring period (approx 1day and 6 min.).  No pauses greater than or equal to 3 seconds. Total supraventricular ectopic burden N/A. Total ventricular ectopic burden <1%.  Heart rate < 60 bpm for 50% of the recording. Heart rate > 100 bpm for 0% of the recording.    LABORATORY DATA: CBC Latest Ref Rng & Units 02/17/2020 11/11/2019 09/28/2019  WBC 3.4 - 10.8 x10E3/uL 7.8 7.7 10.5  Hemoglobin 13.0 - 17.7 g/dL 16.3 15.4 12.4(L)  Hematocrit 37.5 - 51.0 % 45.1 45.2 35.1(L)  Platelets 150 - 450 x10E3/uL 224 246 219    CMP Latest Ref Rng & Units 02/17/2020 11/11/2019 09/28/2019  Glucose 65 - 99 mg/dL 106(H) 94 110(H)  BUN 8 - 27 mg/dL 15 12 23   Creatinine 0.76 - 1.27 mg/dL 0.90 0.92 0.95  Sodium 134 - 144 mmol/L 141 142 136  Potassium 3.5 - 5.2 mmol/L 4.5 4.2 3.5  Chloride 96 - 106 mmol/L 104 105 106  CO2 20 - 29 mmol/L 23 26 22   Calcium 8.6 - 10.2 mg/dL 8.9 8.9 7.8(L)  Total Protein 6.0 - 8.5 g/dL - 6.7 -  Total Bilirubin 0.0 - 1.2 mg/dL - 0.4 -  Alkaline Phos 48 - 121 IU/L - 69 -  AST 0 - 40 IU/L - 20 -  ALT 0 - 44 IU/L - 29 -    Lipid Panel     Component Value Date/Time   CHOL 134 06/03/2019 1348   TRIG 113 06/03/2019 1348   HDL 44 06/03/2019 1348   CHOLHDL 3.0 06/03/2019 1348   CHOLHDL 2.8 01/09/2017 1004   VLDL 15 01/09/2017 1004   LDLCALC 69 06/03/2019 1348   LABVLDL 21 06/03/2019 1348   No  components found for: NTPROBNP Lab Results  Component Value Date   TSH 0.544 09/27/2019   IMPRESSION:    ICD-10-CM   1. Paroxysmal atrial fibrillation (HCC)  I48.0 EKG 12-Lead    2. Long term (current) use of anticoagulants  Z79.01     3. History of CVA with residual deficits  Z86.73     4. Essential hypertension  I10     5. Mixed hyperlipidemia  E78.2     6. Right bundle branch block  I45.10     7. Former smoker  Z87.891     54. Carotid stenosis, bilateral  I65.23        RECOMMENDATIONS: Richard Kent is a 87 y.o. male whose past medical history and cardiac risk factors include: History of stroke in 2001, bilateral carotid artery disease, hypertension, hyperlipidemia, right bundle branch block, GERD,  recently diagnosed with atrial fibrillation, advanced age.  Persistent atrial fibrillation (HCC) Rate control: Metoprolol. Rhythm control: N/A. Thromboembolic prophylaxis: Eliquis EKG today shows rate controlled atrial fibrillation similar to prior ECG from April 2022.  I suspect that he has underlying persistent A. fib. We discussed direct-current cardioversion as an option to restore normal sinus rhythm.  However, since he is asymptomatic they would like to continue medical therapy at this time which is very reasonable given his age and comorbid conditions. We also discussed discontinuing amlodipine and up titration of beta-blocker therapy to help improve his ventricular rate to see if he spontaneously would convert to normal sinus but they favor to continue the same medical therapy at this time. Will continue to monitor.  Long term (current) use of anticoagulants Indication: Atrial fibrillation. CHA2DS2-VASc SCORE is 6 which correlates to 9.8% risk of stroke per year. Reemphasized the risks, benefits, and alternatives to oral anticoagulation. Check hemoglobin hematocrit and renal function  History of CVA with residual deficits Educated on the importance of secondary  prevention.  Essential hypertension Office blood pressures are very well controlled. Medications reconciled. Reemphasized the importance of a low-salt diet.  Mixed hyperlipidemia Currently on simvastatin.   He denies myalgia or other side effects. Most recent lipids dated January 2021 reviewed as noted above. Currently managed by primary care provider.  Carotid stenosis, bilateral Reviewed the last carotid duplex from November 2021 results noted above.   Discussed the findings with the patient and his wife. Currently follows with vascular surgery, will defer management to them.  Former smoker: Educated on the importance of continued smoking cessation.  FINAL MEDICATION LIST END OF ENCOUNTER: No orders of the defined types were placed in this encounter.   Medications Discontinued During This Encounter  Medication Reason   ondansetron (ZOFRAN) 4 MG tablet Error   baclofen (LIORESAL) 10 MG tablet Error     Current Outpatient Medications:    amLODipine (NORVASC) 2.5 MG tablet, Take 1 tablet by mouth once daily, Disp: 90 tablet, Rfl: 0   Ascorbic Acid (VITAMIN C PO), Take by mouth., Disp: , Rfl:    aspirin 81 MG tablet, Take 81 mg by mouth every morning., Disp: , Rfl:    ELIQUIS 5 MG TABS tablet, Take 1 tablet by mouth twice daily, Disp: 180 tablet, Rfl: 0   metoprolol succinate (TOPROL XL) 25 MG 24 hr tablet, Take 0.5 tablets (12.5 mg total) by mouth daily. Hold if systolic blood pressure (top blood pressure number) less than 100 mmHg or heart rate less than 60 bpm (pulse)., Disp: 45 tablet, Rfl: 1   Multiple Vitamin (MULTIVITAMIN WITH MINERALS) TABS, Take 1 tablet by mouth every morning. Reported on 09/06/2015, Disp: , Rfl:    simvastatin (ZOCOR) 20 MG tablet, TAKE 1 TABLET BY MOUTH ONCE DAILY IN THE EVENING, Disp: 90 tablet, Rfl: 1   VITAMIN D PO, Take by mouth., Disp: , Rfl:   Current Facility-Administered Medications:    botulinum toxin Type A (BOTOX) injection 300 Units, 300  Units, Intramuscular, Once, Marcial Pacas, MD  Orders Placed This Encounter  Procedures   EKG 12-Lead    --Continue cardiac medications as reconciled in final medication list. --No follow-ups on file. Or sooner if needed. --Continue follow-up with your primary care physician regarding the management of your other chronic comorbid conditions.  Patient's questions and concerns were addressed to his satisfaction. He voices understanding of the instructions provided during this encounter.   This note was created using a voice recognition software  as a result there may be grammatical errors inadvertently enclosed that do not reflect the nature of this encounter. Every attempt is made to correct such errors.  Rex Kras, [place], [facility]  Pager: [phone] Office: [phone]

## 2021-05-07 ENCOUNTER — Other Ambulatory Visit: Payer: Self-pay | Admitting: Family Medicine

## 2021-06-07 ENCOUNTER — Other Ambulatory Visit: Payer: Self-pay | Admitting: Family Medicine

## 2021-06-07 DIAGNOSIS — E785 Hyperlipidemia, unspecified: Secondary | ICD-10-CM

## 2021-06-11 ENCOUNTER — Other Ambulatory Visit: Payer: Self-pay | Admitting: Family Medicine

## 2021-07-11 ENCOUNTER — Other Ambulatory Visit: Payer: Self-pay | Admitting: Family Medicine

## 2021-07-11 MED ORDER — APIXABAN 5 MG PO TABS: 5.0000 mg | ORAL_TABLET | Freq: Two times a day (BID) | ORAL | 3 refills | Status: DC

## 2021-07-11 NOTE — Addendum Note (Signed)
Addended by: Denita Lung on: 07/11/2021 09:11 AM   Modules accepted: Orders

## 2021-08-22 ENCOUNTER — Other Ambulatory Visit: Payer: Self-pay | Admitting: Family Medicine

## 2021-08-30 ENCOUNTER — Other Ambulatory Visit: Payer: Self-pay

## 2021-08-30 DIAGNOSIS — I480 Paroxysmal atrial fibrillation: Secondary | ICD-10-CM

## 2021-08-30 MED ORDER — METOPROLOL SUCCINATE ER 25 MG PO TB24: 12.5000 mg | ORAL_TABLET | Freq: Every day | ORAL | 3 refills | Status: DC

## 2021-09-07 ENCOUNTER — Other Ambulatory Visit: Payer: Self-pay | Admitting: Family Medicine

## 2021-09-07 DIAGNOSIS — E785 Hyperlipidemia, unspecified: Secondary | ICD-10-CM

## 2021-09-09 ENCOUNTER — Ambulatory Visit (INDEPENDENT_AMBULATORY_CARE_PROVIDER_SITE_OTHER): Payer: Medicare Other

## 2021-09-09 VITALS — Ht 72.0 in | Wt 170.0 lb

## 2021-09-09 DIAGNOSIS — Z0000 Encounter for general adult medical examination without abnormal findings: Secondary | ICD-10-CM | POA: Diagnosis not present

## 2021-09-09 NOTE — Patient Instructions (Signed)
Richard Kent , ?Thank you for taking time to come for your Medicare Wellness Visit. I appreciate your ongoing commitment to your health goals. Please review the following plan we discussed and let me know if I can assist you in the future.  ? ?Screening recommendations/referrals: ?Colonoscopy: not required ?Recommended yearly ophthalmology/optometry visit for glaucoma screening and checkup ?Recommended yearly dental visit for hygiene and checkup ? ?Vaccinations: ?Influenza vaccine: due 12/20/2021 ?Pneumococcal vaccine: completed 03/17/2014 ?Tdap vaccine: due ?Shingles vaccine: discussed   ?Covid-19:  03/24/2020, 08/13/2019, 07/20/2019,  ? ?Advanced directives: Advance directive discussed with you today.  ? ?Conditions/risks identified: none ? ?Next appointment: Follow up in one year for your annual wellness visit.  ? ?Preventive Care 3 Years and Older, Male ?Preventive care refers to lifestyle choices and visits with your health care provider that can promote health and wellness. ?What does preventive care include? ?A yearly physical exam. This is also called an annual well check. ?Dental exams once or twice a year. ?Routine eye exams. Ask your health care provider how often you should have your eyes checked. ?Personal lifestyle choices, including: ?Daily care of your teeth and gums. ?Regular physical activity. ?Eating a healthy diet. ?Avoiding tobacco and drug use. ?Limiting alcohol use. ?Practicing safe sex. ?Taking low doses of aspirin every day. ?Taking vitamin and mineral supplements as recommended by your health care provider. ?What happens during an annual well check? ?The services and screenings done by your health care provider during your annual well check will depend on your age, overall health, lifestyle risk factors, and family history of disease. ?Counseling  ?Your health care provider may ask you questions about your: ?Alcohol use. ?Tobacco use. ?Drug use. ?Emotional well-being. ?Home and relationship  well-being. ?Sexual activity. ?Eating habits. ?History of falls. ?Memory and ability to understand (cognition). ?Work and work Statistician. ?Screening  ?You may have the following tests or measurements: ?Height, weight, and BMI. ?Blood pressure. ?Lipid and cholesterol levels. These may be checked every 5 years, or more frequently if you are over 37 years old. ?Skin check. ?Lung cancer screening. You may have this screening every year starting at age 44 if you have a 30-pack-year history of smoking and currently smoke or have quit within the past 15 years. ?Fecal occult blood test (FOBT) of the stool. You may have this test every year starting at age 27. ?Flexible sigmoidoscopy or colonoscopy. You may have a sigmoidoscopy every 5 years or a colonoscopy every 10 years starting at age 45. ?Prostate cancer screening. Recommendations will vary depending on your family history and other risks. ?Hepatitis C blood test. ?Hepatitis B blood test. ?Sexually transmitted disease (STD) testing. ?Diabetes screening. This is done by checking your blood sugar (glucose) after you have not eaten for a while (fasting). You may have this done every 1-3 years. ?Abdominal aortic aneurysm (AAA) screening. You may need this if you are a current or former smoker. ?Osteoporosis. You may be screened starting at age 56 if you are at high risk. ?Talk with your health care provider about your test results, treatment options, and if necessary, the need for more tests. ?Vaccines  ?Your health care provider may recommend certain vaccines, such as: ?Influenza vaccine. This is recommended every year. ?Tetanus, diphtheria, and acellular pertussis (Tdap, Td) vaccine. You may need a Td booster every 10 years. ?Zoster vaccine. You may need this after age 96. ?Pneumococcal 13-valent conjugate (PCV13) vaccine. One dose is recommended after age 81. ?Pneumococcal polysaccharide (PPSV23) vaccine. One dose is recommended after age  11. ?Talk to your health care  provider about which screenings and vaccines you need and how often you need them. ?This information is not intended to replace advice given to you by your health care provider. Make sure you discuss any questions you have with your health care provider. ?Document Released: 06/04/2015 Document Revised: 01/26/2016 Document Reviewed: 03/09/2015 ?Elsevier Interactive Patient Education ? 2017 [place]. ? ?Fall Prevention in the Home ?Falls can cause injuries. They can happen to people of all ages. There are many things you can do to make your home safe and to help prevent falls. ?What can I do on the outside of my home? ?Regularly fix the edges of walkways and driveways and fix any cracks. ?Remove anything that might make you trip as you walk through a door, such as a raised step or threshold. ?Trim any bushes or trees on the path to your home. ?Use bright outdoor lighting. ?Clear any walking paths of anything that might make someone trip, such as rocks or tools. ?Regularly check to see if handrails are loose or broken. Make sure that both sides of any steps have handrails. ?Any raised decks and porches should have guardrails on the edges. ?Have any leaves, snow, or ice cleared regularly. ?Use sand or salt on walking paths during winter. ?Clean up any spills in your garage right away. This includes oil or grease spills. ?What can I do in the bathroom? ?Use night lights. ?Install grab bars by the toilet and in the tub and shower. Do not use towel bars as grab bars. ?Use non-skid mats or decals in the tub or shower. ?If you need to sit down in the shower, use a plastic, non-slip stool. ?Keep the floor dry. Clean up any water that spills on the floor as soon as it happens. ?Remove soap buildup in the tub or shower regularly. ?Attach bath mats securely with double-sided non-slip rug tape. ?Do not have throw rugs and other things on the floor that can make you trip. ?What can I do in the bedroom? ?Use night lights. ?Make  sure that you have a light by your bed that is easy to reach. ?Do not use any sheets or blankets that are too big for your bed. They should not hang down onto the floor. ?Have a firm chair that has side arms. You can use this for support while you get dressed. ?Do not have throw rugs and other things on the floor that can make you trip. ?What can I do in the kitchen? ?Clean up any spills right away. ?Avoid walking on wet floors. ?Keep items that you use a lot in easy-to-reach places. ?If you need to reach something above you, use a strong step stool that has a grab bar. ?Keep electrical cords out of the way. ?Do not use floor polish or wax that makes floors slippery. If you must use wax, use non-skid floor wax. ?Do not have throw rugs and other things on the floor that can make you trip. ?What can I do with my stairs? ?Do not leave any items on the stairs. ?Make sure that there are handrails on both sides of the stairs and use them. Fix handrails that are broken or loose. Make sure that handrails are as long as the stairways. ?Check any carpeting to make sure that it is firmly attached to the stairs. Fix any carpet that is loose or worn. ?Avoid having throw rugs at the top or bottom of the stairs. If you do have  throw rugs, attach them to the floor with carpet tape. ?Make sure that you have a light switch at the top of the stairs and the bottom of the stairs. If you do not have them, ask someone to add them for you. ?What else can I do to help prevent falls? ?Wear shoes that: ?Do not have high heels. ?Have rubber bottoms. ?Are comfortable and fit you well. ?Are closed at the toe. Do not wear sandals. ?If you use a stepladder: ?Make sure that it is fully opened. Do not climb a closed stepladder. ?Make sure that both sides of the stepladder are locked into place. ?Ask someone to hold it for you, if possible. ?Clearly mark and make sure that you can see: ?Any grab bars or handrails. ?First and last steps. ?Where the  edge of each step is. ?Use tools that help you move around (mobility aids) if they are needed. These include: ?Canes. ?Walkers. ?Scooters. ?Crutches. ?Turn on the lights when you go into a dark area. Repla

## 2021-09-09 NOTE — Progress Notes (Signed)
?I connected with [name] today by telephone and verified that I am speaking with the correct person using two identifiers. ?Location patient: home ?Location provider: work ?Persons participating in the virtual visit: [name], Mrs. Marcelis, Wissner LPN. ?  ?I discussed the limitations, risks, security and privacy concerns of performing an evaluation and management service by telephone and the availability of in person appointments. I also discussed with the patient that there may be a patient responsible charge related to this service. The patient expressed understanding and verbally consented to this telephonic visit.  ?  ?Interactive audio and video telecommunications were attempted between this provider and patient, however failed, due to patient having technical difficulties OR patient did not have access to video capability.  We continued and completed visit with audio only. ? ?  ? ?Vital signs may be patient reported or missing. ? ?Subjective:  ? Richard Kent is a 88 y.o. male who presents for Medicare Annual/Subsequent preventive examination. ? ?Review of Systems    ? ?Cardiac Risk Factors include: advanced age (>69mn, >>44women);dyslipidemia;hypertension;male gender ? ?   ?Objective:  ?  ?Today's Vitals  ? 09/09/21 1326  ?Weight: 170 lb (77.1 kg)  ?Height: 6' (1.829 m)  ? ?Body mass index is 23.06 kg/m?. ? ? ?  09/09/2021  ?  1:33 PM 09/27/2019  ?  4:48 AM 05/31/2018  ? 11:09 AM 12/07/2014  ?  6:08 PM 01/18/2012  ?  5:02 PM 01/15/2012  ?  1:00 PM 01/13/2012  ?  3:32 AM  ?Advanced Directives  ?Does Patient Have a Medical Advance Directive? No No No Yes Patient does not have advance directive;Patient would like information Patient does not have advance directive;Patient would not like information Patient does not have advance directive;Patient would not like information  ?Type of Advance Directive    Living will     ?Copy of [place] Chart?    No - copy requested     ?Would  patient like information on creating a medical advance directive?  No - Patient declined No - Patient declined  Advance directive packet given    ?Pre-existing out of facility DNR order (yellow form or pink MOST form)     No No   ? ? ?Current Medications (verified) ?Outpatient Encounter Medications as of 09/09/2021  ?Medication Sig  ? amLODipine (NORVASC) 2.5 MG tablet Take 1 tablet by mouth once daily  ? apixaban (ELIQUIS) 5 MG TABS tablet Take 1 tablet (5 mg total) by mouth 2 (two) times daily.  ? Ascorbic Acid (VITAMIN C PO) Take by mouth.  ? aspirin 81 MG tablet Take 81 mg by mouth every morning.  ? metoprolol succinate (TOPROL XL) 25 MG 24 hr tablet Take 0.5 tablets (12.5 mg total) by mouth daily. Hold if systolic blood pressure (top blood pressure number) less than 100 mmHg or heart rate less than 60 bpm (pulse).  ? Multiple Vitamin (MULTIVITAMIN WITH MINERALS) TABS Take 1 tablet by mouth every morning. Reported on 09/06/2015  ? simvastatin (ZOCOR) 20 MG tablet TAKE 1 TABLET BY MOUTH ONCE DAILY IN THE EVENING  ? VITAMIN D PO Take by mouth.  ? ?Facility-Administered Encounter Medications as of 09/09/2021  ?Medication  ? botulinum toxin Type A (BOTOX) injection 300 Units  ? ? ?Allergies (verified) ?Patient has no known allergies.  ? ?History: ?Past Medical History:  ?Diagnosis Date  ? Carotid stenosis   ? CVA (cerebral infarction) 2011  ? Diverticulosis   ? Hyperlipidemia   ?  Hypertension   ? Kidney stone   ? Smoker   ? Stroke ([place]) 11/17/09  ? ?Past Surgical History:  ?Procedure Laterality Date  ? APPENDECTOMY    ? HERNIA REPAIR    ? RIGHT  ? HIP ARTHROPLASTY  01/15/2012  ? Procedure: ARTHROPLASTY BIPOLAR HIP;  Surgeon: Rozanna Box, MD;  Location: [facility];  Service: Orthopedics;  Laterality: Right;  ? ?Family History  ?Problem Relation Age of Onset  ? Cancer Mother   ? Stroke Mother   ? Heart disease Father   ? Cervical cancer Sister   ? Heart attack Sister   ? ?Social History  ? ?Socioeconomic History  ? Marital  status: Married  ?  Spouse name: Richard Kent   ? Number of children: 5  ? Years of education: Not on file  ? Highest education level: Not on file  ?Occupational History  ? Occupation: retired  ?  Employer: RETIRED  ?Tobacco Use  ? Smoking status: Former  ?  Packs/day: 2.00  ?  Years: 20.00  ?  Pack years: 40.00  ?  Types: Cigarettes  ?  Quit date: 01/13/1972  ?  Years since quitting: 49.6  ? Smokeless tobacco: Never  ?Vaping Use  ? Vaping Use: Never used  ?Substance and Sexual Activity  ? Alcohol use: No  ?  Comment: Patient quit alcohol 11/17/2009  ? Drug use: No  ? Sexual activity: Not Currently  ?Other Topics Concern  ? Not on file  ?Social History Narrative  ? Patient live at home with his wife. Patient is retired and has five children.   ? ?Social Determinants of Health  ? ?Financial Resource Strain: Low Risk   ? Difficulty of Paying Living Expenses: Not hard at all  ?Food Insecurity: No Food Insecurity  ? Worried About Charity fundraiser in the Last Year: Never true  ? Ran Out of Food in the Last Year: Never true  ?Transportation Needs: No Transportation Needs  ? Lack of Transportation (Medical): No  ? Lack of Transportation (Non-Medical): No  ?Physical Activity: Sufficiently Active  ? Days of Exercise per Week: 7 days  ? Minutes of Exercise per Session: 60 min  ?Stress: No Stress Concern Present  ? Feeling of Stress : Not at all  ?Social Connections: Not on file  ? ? ?Tobacco Counseling ?Counseling given: Not Answered ? ? ?Clinical Intake: ? ?Pre-visit preparation completed: Yes ? ?Pain : No/denies pain ? ?  ? ?Nutritional Status: BMI of 19-24  Normal ?Nutritional Risks: None ?Diabetes: No ? ?How often do you need to have someone help you when you read instructions, pamphlets, or other written materials from your doctor or pharmacy?: 1 - Never ? ?Diabetic? no ? ?Interpreter Needed?: No ? ?Information entered by :: NAllen LPN ? ? ?Activities of Daily Living ? ?  09/09/2021  ?  1:34 PM  ?In your present state of  health, do you have any difficulty performing the following activities:  ?Hearing? 1  ?Vision? 0  ?Difficulty concentrating or making decisions? 1  ?Walking or climbing stairs? 1  ?Dressing or bathing? 1  ?Doing errands, shopping? 1  ?Preparing Food and eating ? Y  ?Using the Toilet? Y  ?In the past six months, have you accidently leaked urine? Y  ?Do you have problems with loss of bowel control? N  ?Managing your Medications? Y  ?Managing your Finances? Y  ?Housekeeping or managing your Housekeeping? Y  ? ? ?Patient Care Team: ?Denita Lung,  MD as PCP - General ? ?Indicate any recent Medical Services you may have received from other than [facility] providers in the past year (date may be approximate). ? ?   ?Assessment:  ? This is a routine wellness examination for Sayid. ? ?Hearing/Vision screen ?Vision Screening - Comments:: No regular eye exams, Marica Otter ? ?Dietary issues and exercise activities discussed: ?Current Exercise Habits: The patient does not participate in regular exercise at present ? ? Goals Addressed   ? ?  ?  ?  ?  ? This Visit's Progress  ?  Patient Stated     ?  09/09/2021, wants to walk independently ?  ? ?  ? ?Depression Screen ? ?  09/09/2021  ?  1:34 PM 11/11/2019  ?  2:33 PM 05/31/2018  ? 10:51 AM 03/20/2018  ?  1:57 PM 01/09/2017  ? 10:18 AM 09/06/2015  ? 10:45 AM 03/17/2014  ?  9:11 AM  ?PHQ 2/9 Scores  ?PHQ - 2 Score 0 0 0 0 0 0 0  ?PHQ- 9 Score 0        ?  ?Fall Risk ? ?  09/09/2021  ?  1:33 PM 11/11/2019  ?  2:32 PM 05/31/2018  ? 10:51 AM 03/20/2018  ?  1:57 PM 01/09/2017  ? 10:18 AM  ?Fall Risk   ?Falls in the past year? 0 0 0 No Exclusion - non ambulatory  ?Number falls in past yr: 0      ?Injury with Fall? 0      ?Risk for fall due to : Impaired balance/gait;Impaired mobility;Medication side effect    History of fall(s)  ?Risk for fall due to: Comment     pt is in a wheelchair   ?Follow up Falls evaluation completed;Education provided;Falls prevention discussed      ? ? ?FALL RISK PREVENTION  PERTAINING TO THE HOME: ? ?Any stairs in or around the home? Yes  ?If so, are there any without handrails? No  ?Home free of loose throw rugs in walkways, pet beds, electrical cords, etc? Yes  ?Hulan Amato

## 2021-09-16 ENCOUNTER — Other Ambulatory Visit: Payer: Self-pay | Admitting: Family Medicine

## 2021-09-16 DIAGNOSIS — E785 Hyperlipidemia, unspecified: Secondary | ICD-10-CM

## 2021-09-19 ENCOUNTER — Ambulatory Visit (INDEPENDENT_AMBULATORY_CARE_PROVIDER_SITE_OTHER): Payer: Medicare Other | Admitting: Family Medicine

## 2021-09-19 ENCOUNTER — Encounter: Payer: Self-pay | Admitting: Family Medicine

## 2021-09-19 VITALS — BP 124/78 | HR 71 | Temp 95.9°F

## 2021-09-19 DIAGNOSIS — E785 Hyperlipidemia, unspecified: Secondary | ICD-10-CM | POA: Diagnosis not present

## 2021-09-19 DIAGNOSIS — I4891 Unspecified atrial fibrillation: Secondary | ICD-10-CM

## 2021-09-19 DIAGNOSIS — I700 Atherosclerosis of aorta: Secondary | ICD-10-CM | POA: Insufficient documentation

## 2021-09-19 DIAGNOSIS — I6523 Occlusion and stenosis of bilateral carotid arteries: Secondary | ICD-10-CM | POA: Diagnosis not present

## 2021-09-19 DIAGNOSIS — I69959 Hemiplegia and hemiparesis following unspecified cerebrovascular disease affecting unspecified side: Secondary | ICD-10-CM

## 2021-09-19 DIAGNOSIS — D692 Other nonthrombocytopenic purpura: Secondary | ICD-10-CM

## 2021-09-19 DIAGNOSIS — I10 Essential (primary) hypertension: Secondary | ICD-10-CM | POA: Diagnosis not present

## 2021-09-19 DIAGNOSIS — Z23 Encounter for immunization: Secondary | ICD-10-CM

## 2021-09-19 DIAGNOSIS — I480 Paroxysmal atrial fibrillation: Secondary | ICD-10-CM

## 2021-09-19 DIAGNOSIS — I4510 Unspecified right bundle-branch block: Secondary | ICD-10-CM

## 2021-09-19 DIAGNOSIS — Z0000 Encounter for general adult medical examination without abnormal findings: Secondary | ICD-10-CM | POA: Diagnosis not present

## 2021-09-19 DIAGNOSIS — R7301 Impaired fasting glucose: Secondary | ICD-10-CM | POA: Diagnosis not present

## 2021-09-19 MED ORDER — APIXABAN 5 MG PO TABS: 5.0000 mg | ORAL_TABLET | Freq: Two times a day (BID) | ORAL | 3 refills | Status: DC

## 2021-09-19 MED ORDER — SIMVASTATIN 20 MG PO TABS: 20.0000 mg | ORAL_TABLET | Freq: Every evening | ORAL | 3 refills | Status: DC

## 2021-09-19 MED ORDER — AMLODIPINE BESYLATE 2.5 MG PO TABS: 2.5000 mg | ORAL_TABLET | Freq: Every day | ORAL | 3 refills | Status: DC

## 2021-09-19 NOTE — Progress Notes (Signed)
? ?  Subjective:  ? ? Patient ID: Richard Kent, male    DOB: Sep 29, 1928, 88 y.o.   MRN: [id number] ? ?HPI ?He is here for complete examination.  He is apparently in the process of getting hearing aids through [employer].  His wife states that he does have a slight cough but no fever, chills, acid reflux symptoms.  He has a history of CVA and uses a cane, walker and wheelchair as needed.  He does have a history of carotid stenosis and does get follow-up on that.  He also has underlying atrial fibs and is doing well on Eliquis.  He follows up regularly with cardiology.  Continues on simvastatin without difficulty.  Does take metoprolol, amlodipine for his blood pressure.  Does have x-ray evidence of aortic atherosclerosis.  Otherwise family and social history as well as health maintenance immunizations was reviewed ? ? ?Review of Systems  ?All other systems reviewed and are negative. ? ?   ?Objective:  ? Physical Exam ?Alert and in no distress. Tympanic membranes and canals are normal. Pharyngeal area is normal. Neck is supple without adenopathy or thyromegaly. Cardiac exam shows an irregular rhythm without murmurs or gallops. Lungs are clear to auscultation.  Ecchymotic lesions noted on both forearms.  Abdominal exam shows no masses or tenderness. ? ? ? ? ?   ?Assessment & Plan:  ?Routine general medical examination at a health care facility - Plan: CBC with Differential/Platelet, Comprehensive metabolic panel, Lipid panel ? ?Carotid stenosis, bilateral - Plan: CBC with Differential/Platelet, Comprehensive metabolic panel ? ?Primary hypertension - Plan: CBC with Differential/Platelet, Comprehensive metabolic panel, amLODipine (NORVASC) 2.5 MG tablet ? ?RBBB ? ?Senile purpura ([place]) ? ?Hemiplegia as late effect of cerebrovascular disease, unspecified cerebrovascular disease type, unspecified hemiplegia type, unspecified laterality ([place]) ? ?Hyperlipidemia LDL goal <100 - Plan: simvastatin (ZOCOR) 20 MG tablet ? ?Atrial  fibrillation, unspecified type ([place]) ? ?Encounter for Medicare annual wellness exam ? ?Aortic atherosclerosis ([place]) - Plan: Lipid panel ? ?Need for COVID-19 vaccine - Plan: Pension scheme manager ? ?Paroxysmal atrial fibrillation (HCC) - Plan: apixaban (ELIQUIS) 5 MG TABS tablet ?Continue on present medication regimen.  Continue be followed by cardiology as well as cardiovascular.  He will keep me informed concerning the hearing aids and how he is doing with them. ?Did recommend stop taking the aspirin. ?

## 2021-09-20 LAB — COMPREHENSIVE METABOLIC PANEL
ALT: 29 IU/L (ref 0–44)
AST: 26 IU/L (ref 0–40)
Albumin/Globulin Ratio: 1.5 (ref 1.2–2.2)
Albumin: 4.1 g/dL (ref 3.5–4.6)
Alkaline Phosphatase: 72 IU/L (ref 44–121)
BUN/Creatinine Ratio: 12 (ref 10–24)
BUN: 12 mg/dL (ref 10–36)
Bilirubin Total: 0.5 mg/dL (ref 0.0–1.2)
CO2: 26 mmol/L (ref 20–29)
Calcium: 9.1 mg/dL (ref 8.6–10.2)
Chloride: 102 mmol/L (ref 96–106)
Creatinine, Ser: 1.02 mg/dL (ref 0.76–1.27)
Globulin, Total: 2.7 g/dL (ref 1.5–4.5)
Glucose: 129 mg/dL — ABNORMAL HIGH (ref 70–99)
Potassium: 4.4 mmol/L (ref 3.5–5.2)
Sodium: 140 mmol/L (ref 134–144)
Total Protein: 6.8 g/dL (ref 6.0–8.5)
eGFR: 70 mL/min/{1.73_m2} (ref >59)

## 2021-09-20 LAB — LIPID PANEL
Chol/HDL Ratio: 3.1 ratio (ref 0.0–5.0)
Cholesterol, Total: 119 mg/dL (ref 100–199)
HDL: 39 mg/dL — ABNORMAL LOW (ref >39)
LDL Chol Calc (NIH): 52 mg/dL (ref 0–99)
Triglycerides: 164 mg/dL — ABNORMAL HIGH (ref 0–149)
VLDL Cholesterol Cal: 28 mg/dL (ref 5–40)

## 2021-09-20 LAB — CBC WITH DIFFERENTIAL/PLATELET
Basophils Absolute: 0.1 10*3/uL (ref 0.0–0.2)
Basos: 1 %
EOS (ABSOLUTE): 0.1 10*3/uL (ref 0.0–0.4)
Eos: 1 %
Hematocrit: 45.1 % (ref 37.5–51.0)
Hemoglobin: 16 g/dL (ref 13.0–17.7)
Immature Grans (Abs): 0 10*3/uL (ref 0.0–0.1)
Immature Granulocytes: 0 %
Lymphocytes Absolute: 1.4 10*3/uL (ref 0.7–3.1)
Lymphs: 19 %
MCH: 33.7 pg — ABNORMAL HIGH (ref 26.6–33.0)
MCHC: 35.5 g/dL (ref 31.5–35.7)
MCV: 95 fL (ref 79–97)
Monocytes Absolute: 0.8 10*3/uL (ref 0.1–0.9)
Monocytes: 11 %
Neutrophils Absolute: 4.9 10*3/uL (ref 1.4–7.0)
Neutrophils: 68 %
Platelets: 222 10*3/uL (ref 150–450)
RBC: 4.75 x10E6/uL (ref 4.14–5.80)
RDW: 13.1 % (ref 11.6–15.4)
WBC: 7.2 10*3/uL (ref 3.4–10.8)

## 2021-09-22 ENCOUNTER — Ambulatory Visit: Payer: Medicare Other | Admitting: Cardiology

## 2021-09-29 ENCOUNTER — Encounter: Payer: Self-pay | Admitting: Family Medicine

## 2021-09-29 ENCOUNTER — Telehealth (INDEPENDENT_AMBULATORY_CARE_PROVIDER_SITE_OTHER): Payer: Medicare Other | Admitting: Family Medicine

## 2021-09-29 VITALS — BP 124/78 | Wt 170.0 lb

## 2021-09-29 DIAGNOSIS — E119 Type 2 diabetes mellitus without complications: Secondary | ICD-10-CM

## 2021-09-29 DIAGNOSIS — I6523 Occlusion and stenosis of bilateral carotid arteries: Secondary | ICD-10-CM

## 2021-09-29 NOTE — Progress Notes (Signed)
? ?  Subjective:  ? ? Patient ID: Richard Kent, male    DOB: 1929-04-24, 88 y.o.   MRN: [id number] ? ?HPI ?Documentation for virtual audio  telecommunications : ? ?The patient was located at home. 2 patient identifiers used.  ?The provider was located in the office. ?The patient did consent to this visit and is aware of possible charges through their insurance for this visit. ?The other persons participating in this telemedicine service were none. ?Time spent on call was 5 minutes and in review of previous records >15 minutes total for counseling and coordination of care. ?This virtual service is not related to other E/M service within previous 7 days.  ?He was here recently and blood work was drawn which did show an elevated blood sugar.  Subsequent follow-up hemoglobin A1c did show that he was 6.7.  This information was conveyed through his wife to him.  She is a diabetic so she is well aware of the need for diet and exercise as well as checking blood sugars before and 2 hours after meals and she conveyed this information to him.  Physical activity is limited due to his previous history of CVA.  Also the subsequent need for follow-up on foot care and eye care.  She will be checking his blood sugars before and after meals. ? ?Review of Systems ? ?   ?Objective:  ? Physical Exam ?Alert and in no distress otherwise not examined ? ? ? ?   ?Assessment & Plan:  ? ?New onset type 2 diabetes mellitus ([place]) ?Recheck here in about 4 months. ?

## 2021-10-11 LAB — SPECIMEN STATUS REPORT

## 2021-10-11 LAB — HGB A1C W/O EAG: Hgb A1c MFr Bld: 6.7 % — ABNORMAL HIGH (ref 4.8–5.6)

## 2021-11-02 ENCOUNTER — Ambulatory Visit (INDEPENDENT_AMBULATORY_CARE_PROVIDER_SITE_OTHER): Payer: Medicare Other | Admitting: Family Medicine

## 2021-11-02 VITALS — BP 132/74 | HR 65 | Temp 96.8°F

## 2021-11-02 DIAGNOSIS — E119 Type 2 diabetes mellitus without complications: Secondary | ICD-10-CM | POA: Diagnosis not present

## 2021-11-02 DIAGNOSIS — I6523 Occlusion and stenosis of bilateral carotid arteries: Secondary | ICD-10-CM

## 2021-11-02 MED ORDER — ACCU-CHEK SOFTCLIX LANCETS MISC: 12 refills | Status: DC

## 2021-11-02 MED ORDER — BLOOD GLUCOSE TEST VI STRP: 1.0000 | ORAL_STRIP | Freq: Two times a day (BID) | 2 refills | Status: DC

## 2021-11-02 NOTE — Progress Notes (Signed)
   Subjective:    Patient ID: Richard Kent, male    DOB: Apr 12, 1929, 88 y.o.   MRN: [id number]  HPI He is here for follow-up visit after recent diagnosis of diabetes.  His hemoglobin A1c was 6.7.  His wife is a diabetic and has been taking excellent care of herself for the last several years.  She is well aware of the need for diet and exercise.  He does ride his stationary bicycle for an hour every day.  He and his wife have no particular concerns.   Review of Systems     Objective:   Physical Exam  Alert and in no distress otherwise not examined      Assessment & Plan:  New onset type 2 diabetes mellitus ([place]) Discussed the diagnosis of diabetes as well as diet and exercise.  Information given concerning checking blood sugars regularly by my CMA he will be scheduled for repeat evaluation in 3 months.

## 2021-11-02 NOTE — Addendum Note (Signed)
Addended by: Elyse Jarvis on: 11/02/2021 02:58 PM   Modules accepted: Orders

## 2021-11-17 ENCOUNTER — Other Ambulatory Visit: Payer: Self-pay

## 2021-11-17 DIAGNOSIS — E119 Type 2 diabetes mellitus without complications: Secondary | ICD-10-CM

## 2021-11-17 MED ORDER — BLOOD GLUCOSE TEST VI STRP: 1.0000 | ORAL_STRIP | Freq: Two times a day (BID) | 2 refills | Status: DC

## 2021-12-27 ENCOUNTER — Telehealth (INDEPENDENT_AMBULATORY_CARE_PROVIDER_SITE_OTHER): Payer: Medicare Other | Admitting: Family Medicine

## 2021-12-27 VITALS — Temp 97.2°F | Wt 170.0 lb

## 2021-12-27 DIAGNOSIS — I6523 Occlusion and stenosis of bilateral carotid arteries: Secondary | ICD-10-CM

## 2021-12-27 DIAGNOSIS — M79604 Pain in right leg: Secondary | ICD-10-CM

## 2021-12-27 NOTE — Progress Notes (Signed)
   Subjective:    Patient ID: Richard Kent, male    DOB: 1929/04/23, 88 y.o.   MRN: [id number]  HPI Documentation for virtual audio telecommunications through [place] encounter: The patient was located at home. 2 patient identifiers used.  The provider was located in the office. The patient did consent to this visit and is aware of possible charges through their insurance for this visit. The other persons participating in this telemedicine service was his wife Time spent on call was 5 minutes and in review of previous records >10 minutes total for counseling and coordination of care. This virtual service is not related to other E/M service within previous 7 days.  She states that he has a several month history of right leg pain.  It is on the same side that he had a CVA on.  In the past he was given baclofen for apparently some spasms but they stopped it due to sedation.  She has been trying Tylenol with minimal success.  Review of Systems     Objective:   Physical Exam Patient not examined       Assessment & Plan:  Pain of right lower extremity I explained that it is difficult to say what is going on without examining him but certainly reasonable to try him on baclofen twice a day for the next couple days and see if that has any benefit.  If not then he will need to come in.  She expressed understanding of this.

## 2022-01-04 ENCOUNTER — Telehealth: Payer: Self-pay | Admitting: Family Medicine

## 2022-01-04 NOTE — Telephone Encounter (Signed)
Pt's wife called and states pt continues to have issues with leg pain, She did try the medication discussed at his Video Visit but it is not help. Pt was advised that per [facility] notes if that was the case pt would need an appt. She states that she can't get him to the office by herself. She is requesting a home health assessment. Please advise wife at [phone] or [phone].

## 2022-01-06 ENCOUNTER — Ambulatory Visit (INDEPENDENT_AMBULATORY_CARE_PROVIDER_SITE_OTHER): Payer: Medicare Other | Admitting: Medical

## 2022-01-06 VITALS — BP 122/80 | HR 78 | Temp 98.0°F | Wt 175.0 lb

## 2022-01-06 DIAGNOSIS — M7989 Other specified soft tissue disorders: Secondary | ICD-10-CM

## 2022-01-06 DIAGNOSIS — L989 Disorder of the skin and subcutaneous tissue, unspecified: Secondary | ICD-10-CM | POA: Diagnosis not present

## 2022-01-06 DIAGNOSIS — I69959 Hemiplegia and hemiparesis following unspecified cerebrovascular disease affecting unspecified side: Secondary | ICD-10-CM | POA: Diagnosis not present

## 2022-01-06 DIAGNOSIS — E785 Hyperlipidemia, unspecified: Secondary | ICD-10-CM

## 2022-01-06 DIAGNOSIS — L819 Disorder of pigmentation, unspecified: Secondary | ICD-10-CM

## 2022-01-06 DIAGNOSIS — I6523 Occlusion and stenosis of bilateral carotid arteries: Secondary | ICD-10-CM | POA: Diagnosis not present

## 2022-01-06 DIAGNOSIS — E118 Type 2 diabetes mellitus with unspecified complications: Secondary | ICD-10-CM

## 2022-01-06 DIAGNOSIS — I10 Essential (primary) hypertension: Secondary | ICD-10-CM

## 2022-01-06 MED ORDER — FUROSEMIDE 20 MG PO TABS: 10.0000 mg | ORAL_TABLET | Freq: Every day | ORAL | 0 refills | Status: DC

## 2022-01-06 MED ORDER — CEPHALEXIN 500 MG PO CAPS: 500.0000 mg | ORAL_CAPSULE | Freq: Three times a day (TID) | ORAL | 0 refills | Status: DC

## 2022-01-06 MED ORDER — GABAPENTIN 100 MG PO CAPS: 100.0000 mg | ORAL_CAPSULE | Freq: Every day | ORAL | 0 refills | Status: DC

## 2022-01-06 MED ORDER — MUPIROCIN 2 % EX OINT: 1.0000 | TOPICAL_OINTMENT | Freq: Two times a day (BID) | CUTANEOUS | 0 refills | Status: DC

## 2022-01-06 NOTE — Progress Notes (Signed)
Subjective:  Richard Kent is a 88 y.o. male who presents for Chief Complaint  Patient presents with   severe pain    Right side pain from hip down foot. Has sores on right and left legs and on his back      Here with wife, and 2 daughters.  He history of stroke 12 years ago with right-sided weakness ever since.  His wife is his caregiver and has been helping him with his ADLs for the last 12 years.  This is worked reasonably well.  She notes that his right lower leg and foot stays swollen for years.  In typically the foot looks a little pinkish but in the last 2 days it has looked more reddish.  No warmth noted.  He does not have fever body aches or chills.  However in the last few days he has been complaining of pain of his foot and has been less mobile.  He normally is able to walk around the house some with assistance but over the weekend he had to be helped up into the chair and EMS was called to help him into the bed.  He reports a small wound on his left lower leg and pain has been worse of late in his right foot.  Wife notes that there is an ulcer on his upper back that has been there for months.  It does not want to heal.  It does cause some discomfort  Using tylenol for pain but needs something stronger  When he lies down he crosses his leg which wife thinks is causing some of the problems with his foot  Blood sugars been running good including 117 fasting this morning.  Sits in wheelchair  Family also is thinking about getting him a chairlift for the stairs and a new wheelchair as his wheelchair is wearing out.  No other aggravating or relieving factors.    No other c/o.  Past Medical History:  Diagnosis Date   Carotid stenosis    CVA (cerebral infarction) 2011   Diverticulosis    Hyperlipidemia    Hypertension    Kidney stone    Smoker    Stroke ([place]) 11/17/09   Current Outpatient Medications on File Prior to Visit  Medication Sig Dispense Refill   amLODipine  (NORVASC) 2.5 MG tablet Take 1 tablet (2.5 mg total) by mouth daily. 90 tablet 3   apixaban (ELIQUIS) 5 MG TABS tablet Take 1 tablet (5 mg total) by mouth 2 (two) times daily. 180 tablet 3   metoprolol succinate (TOPROL XL) 25 MG 24 hr tablet Take 0.5 tablets (12.5 mg total) by mouth daily. Hold if systolic blood pressure (top blood pressure number) less than 100 mmHg or heart rate less than 60 bpm (pulse). 45 tablet 3   Multiple Vitamin (MULTIVITAMIN WITH MINERALS) TABS Take 1 tablet by mouth every morning. Reported on 09/06/2015     simvastatin (ZOCOR) 20 MG tablet Take 1 tablet (20 mg total) by mouth every evening. 90 tablet 3   VITAMIN D PO Take by mouth.     Accu-Chek Softclix Lancets lancets Use as instructed 200 each 12   acetaminophen (TYLENOL) 500 MG tablet Take 500 mg by mouth every 6 (six) hours as needed.     Ascorbic Acid (VITAMIN C PO) Take by mouth.     Glucose Blood (BLOOD GLUCOSE TEST STRIPS) STRP 1 each by In Vitro route in the morning and at bedtime. 200 strip 2   No current facility-administered  medications on file prior to visit.     The following portions of the patient's history were reviewed and updated as appropriate: allergies, current medications, past family history, past medical history, past social history, past surgical history and problem list.  ROS Otherwise as in subjective above  Objective: BP 122/80   Pulse 78   Temp 98 F (36.7 C)   Wt 175 lb (79.4 kg)   BMI 23.73 kg/m   General appearance: alert, no distress, well developed, well nourished Skin: Mid upper back with a rather large 5 cm round slightly raised flat topped lesion that is pinkish-red and friable.  Worrisome for possible skin cancer.  There is surrounding square-shaped erythema that seems to be irritation from the Band-Aids in the shape of the Band-Aids Left proximal lower leg anteriorly with a small 1 cm ulcerated wound with slight erythema nontender Right posterior heel with a 1.5 cm  round preulcer that is tender.  The right foot in lower leg had generalized swelling 2+ nonpitting but the foot itself is pinkish-red but not particularly warm.  There is some weeping fluid from the foot, toenails with hypertrophy throughout, 1+ pulse both feet General: Pleasant, answers questions, cooperative     Assessment: Encounter Diagnoses  Name Primary?   Skin lesion of back Yes   Foot swelling    Discoloration of skin of foot    Primary hypertension    Hyperlipidemia LDL goal <100    Hemiplegia as late effect of cerebrovascular disease, unspecified cerebrovascular disease type, unspecified hemiplegia type, unspecified laterality ([place])    Type II diabetes mellitus with complication ([place])      Plan: We discussed his symptoms, exam findings, possible complications.  We discussed instructions that were also printed for the family today.  We cleaned up and redressed wound on the back wound on the left lower leg and the wound on the back of the right foot.  Referral today for general surgery given the back lesion  He will follow-up within the next 2 weeks with Dr. Demetrios Isaacs for recheck to discuss there are other concerns including stair lift and new wheelchair  Patient Instructions  Recommendations:  Right foot sweling and redness Try to elevate the leg up on some pillows when possible to help with swelling Begin Gabapentin 100 mg in the evening to help with pain and sleep.  After a week or 2 if this does not seem to be helping as much we can go up to 2 tablets daily at bedtime Begin Keflex antibiotic 3 times a day for the next week for possible infection of the foot Begin Lasix fluid pill also called furosemide.  It is listed as 20 mg, but I want you to start 1/2 tablet daily ('10mg'$ ) in the morning to help with swelling. The fluid pill Lasix will cause some increased urination Try to drink at least 60+ ounces of water daily If worse over the weekend such as worse pain, worse  redness, hot to touch or fever, then go to the emergency dept   Left lower leg wound Clean with soap and water Cover with bandage daily for now until improving Use Mupirocin oitment topicaly twice daily Watch for worseing redness or pain. Recheck if not improivng in the next 10 days   Back lesion Referral to general surgery     Olly was seen today for severe pain.  Diagnoses and all orders for this visit:  Skin lesion of back -     Cancel: Ambulatory referral  to General Surgery -     Ambulatory referral to General Surgery  Foot swelling -     CBC with Differential/Platelet  Discoloration of skin of foot  Primary hypertension  Hyperlipidemia LDL goal <100  Hemiplegia as late effect of cerebrovascular disease, unspecified cerebrovascular disease type, unspecified hemiplegia type, unspecified laterality (HCC)  Type II diabetes mellitus with complication (HCC) -     Hemoglobin A1c  Other orders -     furosemide (LASIX) 20 MG tablet; Take 0.5 tablets (10 mg total) by mouth daily. -     cephALEXin (KEFLEX) 500 MG capsule; Take 1 capsule (500 mg total) by mouth 3 (three) times daily. -     gabapentin (NEURONTIN) 100 MG capsule; Take 1 capsule (100 mg total) by mouth at bedtime. Will likely increase to 2 daily after a week or 2 -     mupirocin ointment (BACTROBAN) 2 %; Apply 1 Application topically 2 (two) times daily.  Spent > 30 minutes face to face with patient in discussion of symptoms, evaluation, plan and recommendations.    Follow up: 1-2 weeks

## 2022-01-06 NOTE — Patient Instructions (Addendum)
Recommendations:  Right foot sweling and redness Try to elevate the leg up on some pillows when possible to help with swelling Begin Gabapentin 100 mg in the evening to help with pain and sleep.  After a week or 2 if this does not seem to be helping as much we can go up to 2 tablets daily at bedtime Begin Keflex antibiotic 3 times a day for the next week for possible infection of the foot Begin Lasix fluid pill also called furosemide.  It is listed as 20 mg, but I want you to start 1/2 tablet daily ('10mg'$ ) in the morning to help with swelling. The fluid pill Lasix will cause some increased urination Try to drink at least 60+ ounces of water daily If worse over the weekend such as worse pain, worse redness, hot to touch or fever, then go to the emergency dept   Left lower leg wound Clean with soap and water Cover with bandage daily for now until improving Use Mupirocin oitment topicaly twice daily Watch for worseing redness or pain. Recheck if not improivng in the next 10 days   Back lesion Referral to general surgery

## 2022-01-07 LAB — HEMOGLOBIN A1C
Est. average glucose Bld gHb Est-mCnc: 137 mg/dL
Hgb A1c MFr Bld: 6.4 % — ABNORMAL HIGH (ref 4.8–5.6)

## 2022-01-07 LAB — CBC WITH DIFFERENTIAL/PLATELET
Basophils Absolute: 0 10*3/uL (ref 0.0–0.2)
Basos: 1 %
EOS (ABSOLUTE): 0.1 10*3/uL (ref 0.0–0.4)
Eos: 2 %
Hematocrit: 43 % (ref 37.5–51.0)
Hemoglobin: 14.3 g/dL (ref 13.0–17.7)
Immature Grans (Abs): 0 10*3/uL (ref 0.0–0.1)
Immature Granulocytes: 0 %
Lymphocytes Absolute: 1.1 10*3/uL (ref 0.7–3.1)
Lymphs: 17 %
MCH: 32.4 pg (ref 26.6–33.0)
MCHC: 33.3 g/dL (ref 31.5–35.7)
MCV: 98 fL — ABNORMAL HIGH (ref 79–97)
Monocytes Absolute: 0.6 10*3/uL (ref 0.1–0.9)
Monocytes: 9 %
Neutrophils Absolute: 4.6 10*3/uL (ref 1.4–7.0)
Neutrophils: 71 %
Platelets: 255 10*3/uL (ref 150–450)
RBC: 4.41 x10E6/uL (ref 4.14–5.80)
RDW: 12.8 % (ref 11.6–15.4)
WBC: 6.5 10*3/uL (ref 3.4–10.8)

## 2022-01-08 ENCOUNTER — Emergency Department (HOSPITAL_COMMUNITY)
Admission: EM | Admit: 2022-01-08 | Discharge: 2022-01-08 | Disposition: A | Discharge status: Home / Self Care | Payer: Medicare Other | Attending: Emergency Medicine | Admitting: Emergency Medicine

## 2022-01-08 ENCOUNTER — Encounter (HOSPITAL_COMMUNITY): Payer: Self-pay

## 2022-01-08 ENCOUNTER — Other Ambulatory Visit: Payer: Self-pay

## 2022-01-08 ENCOUNTER — Emergency Department (HOSPITAL_COMMUNITY): Payer: Medicare Other

## 2022-01-08 DIAGNOSIS — Z7901 Long term (current) use of anticoagulants: Secondary | ICD-10-CM | POA: Insufficient documentation

## 2022-01-08 DIAGNOSIS — W06XXXA Fall from bed, initial encounter: Secondary | ICD-10-CM | POA: Diagnosis not present

## 2022-01-08 DIAGNOSIS — M4802 Spinal stenosis, cervical region: Secondary | ICD-10-CM | POA: Diagnosis not present

## 2022-01-08 DIAGNOSIS — Z7401 Bed confinement status: Secondary | ICD-10-CM | POA: Diagnosis not present

## 2022-01-08 DIAGNOSIS — M47812 Spondylosis without myelopathy or radiculopathy, cervical region: Secondary | ICD-10-CM | POA: Insufficient documentation

## 2022-01-08 DIAGNOSIS — W19XXXA Unspecified fall, initial encounter: Secondary | ICD-10-CM | POA: Diagnosis not present

## 2022-01-08 DIAGNOSIS — Y92003 Bedroom of unspecified non-institutional (private) residence as the place of occurrence of the external cause: Secondary | ICD-10-CM | POA: Insufficient documentation

## 2022-01-08 DIAGNOSIS — S0990XA Unspecified injury of head, initial encounter: Secondary | ICD-10-CM | POA: Diagnosis not present

## 2022-01-08 DIAGNOSIS — I4891 Unspecified atrial fibrillation: Secondary | ICD-10-CM | POA: Diagnosis not present

## 2022-01-08 DIAGNOSIS — Z043 Encounter for examination and observation following other accident: Secondary | ICD-10-CM | POA: Diagnosis not present

## 2022-01-08 DIAGNOSIS — I6521 Occlusion and stenosis of right carotid artery: Secondary | ICD-10-CM | POA: Diagnosis not present

## 2022-01-08 DIAGNOSIS — I639 Cerebral infarction, unspecified: Secondary | ICD-10-CM | POA: Diagnosis not present

## 2022-01-08 DIAGNOSIS — R531 Weakness: Secondary | ICD-10-CM | POA: Diagnosis not present

## 2022-01-08 DIAGNOSIS — M50323 Other cervical disc degeneration at C6-C7 level: Secondary | ICD-10-CM | POA: Insufficient documentation

## 2022-01-08 DIAGNOSIS — I10 Essential (primary) hypertension: Secondary | ICD-10-CM | POA: Diagnosis not present

## 2022-01-08 DIAGNOSIS — S199XXA Unspecified injury of neck, initial encounter: Secondary | ICD-10-CM | POA: Diagnosis not present

## 2022-01-08 MED ORDER — ACETAMINOPHEN 500 MG PO TABS
1000.0000 mg | ORAL_TABLET | Freq: Once | ORAL | Status: AC
  Administered 2022-01-08: 1000 mg via ORAL
  Filled 2022-01-08: qty 2

## 2022-01-08 NOTE — ED Provider Notes (Signed)
[facility] EMERGENCY DEPARTMENT Provider Note   CSN: [id number] Arrival date & time: 01/08/22  0325     History  Chief Complaint  Patient presents with   Richard Kent is a 88 y.o. male.  The history is provided by the patient.  Fall This is a new problem. The current episode started less than 1 hour ago. The problem occurs rarely. The problem has been resolved. Pertinent negatives include no chest pain, no abdominal pain, no headaches and no shortness of breath. Nothing aggravates the symptoms. Nothing relieves the symptoms. He has tried nothing for the symptoms. The treatment provided no relief.  Is unsure if he hit head on blood thinners.       Home Medications Prior to Admission medications   Medication Sig Start Date End Date Taking? Authorizing Provider  Accu-Chek Softclix Lancets lancets Use as instructed 11/02/21   Denita Lung, MD  acetaminophen (TYLENOL) 500 MG tablet Take 500 mg by mouth every 6 (six) hours as needed.    [provider]  amLODipine (NORVASC) 2.5 MG tablet Take 1 tablet (2.5 mg total) by mouth daily. 09/19/21   Denita Lung, MD  apixaban (ELIQUIS) 5 MG TABS tablet Take 1 tablet (5 mg total) by mouth 2 (two) times daily. 09/19/21   Denita Lung, MD  Ascorbic Acid (VITAMIN C PO) Take by mouth.    [provider]  cephALEXin (KEFLEX) 500 MG capsule Take 1 capsule (500 mg total) by mouth 3 (three) times daily. 01/06/22   Tysinger, Camelia Eng, PA-C  furosemide (LASIX) 20 MG tablet Take 0.5 tablets (10 mg total) by mouth daily. 01/06/22   Tysinger, Camelia Eng, PA-C  gabapentin (NEURONTIN) 100 MG capsule Take 1 capsule (100 mg total) by mouth at bedtime. Will likely increase to 2 daily after a week or 2 01/06/22   Tysinger, Camelia Eng, PA-C  Glucose Blood (BLOOD GLUCOSE TEST STRIPS) STRP 1 each by In Vitro route in the morning and at bedtime. 11/17/21   Denita Lung, MD  metoprolol succinate (TOPROL XL) 25 MG 24 hr tablet  Take 0.5 tablets (12.5 mg total) by mouth daily. Hold if systolic blood pressure (top blood pressure number) less than 100 mmHg or heart rate less than 60 bpm (pulse). 08/30/21 02/26/22  Tolia, Sunit, DO  Multiple Vitamin (MULTIVITAMIN WITH MINERALS) TABS Take 1 tablet by mouth every morning. Reported on 09/06/2015    [provider]  mupirocin ointment (BACTROBAN) 2 % Apply 1 Application topically 2 (two) times daily. 01/06/22   Tysinger, Camelia Eng, PA-C  simvastatin (ZOCOR) 20 MG tablet Take 1 tablet (20 mg total) by mouth every evening. 09/19/21   Denita Lung, MD  VITAMIN D PO Take by mouth.    [provider]      Allergies    Patient has no known allergies.    Review of Systems   Review of Systems  Constitutional:  Negative for fever.  Respiratory:  Negative for shortness of breath.   Cardiovascular:  Negative for chest pain.  Gastrointestinal:  Negative for abdominal pain.  Genitourinary:  Negative for dysuria.  Neurological:  Negative for headaches.  All other systems reviewed and are negative.   Physical Exam Updated Vital Signs BP (!) 146/64 (BP Location: Right Arm)   Pulse 83   Temp (!) 97.4 F (36.3 C) (Oral)   Resp 20   Ht 6' (1.829 m)   Wt 78 kg  SpO2 96%   BMI 23.32 kg/m  Physical Exam Vitals and nursing note reviewed. Exam conducted with a chaperone present.  Constitutional:      General: He is not in acute distress.    Appearance: He is well-developed. He is not diaphoretic.  HENT:     Head: Normocephalic and atraumatic.     Nose: Nose normal.  Eyes:     Conjunctiva/sclera: Conjunctivae normal.     Pupils: Pupils are equal, round, and reactive to light.  Cardiovascular:     Rate and Rhythm: Normal rate and regular rhythm.  Pulmonary:     Effort: Pulmonary effort is normal.     Breath sounds: Normal breath sounds. No wheezing or rales.  Abdominal:     General: Bowel sounds are normal.     Palpations: Abdomen is soft.     Tenderness:  There is no abdominal tenderness. There is no guarding or rebound.  Musculoskeletal:        General: Normal range of motion.     Cervical back: Normal range of motion and neck supple.  Skin:    General: Skin is warm and dry.     Capillary Refill: Capillary refill takes less than 2 seconds.     Findings: No erythema.  Neurological:     General: No focal deficit present.     Mental Status: He is alert and oriented to person, place, and time.     Deep Tendon Reflexes: Reflexes normal.  Psychiatric:        Mood and Affect: Mood normal.        Behavior: Behavior normal.     ED Results / Procedures / Treatments   Labs (all labs ordered are listed, but only abnormal results are displayed) Labs Reviewed - No data to display  EKG None  Radiology No results found.  Procedures Procedures    Medications Ordered in ED Medications  acetaminophen (TYLENOL) tablet 1,000 mg (has no administration in time range)    ED Course/ Medical Decision Making/ A&P                           Medical Decision Making Fall at home unsure if her hit his head   Amount and/or Complexity of Data Reviewed Independent Historian: EMS    Details: see above External Data Reviewed: notes.    Details: previous notes reviewed Radiology: ordered and independent interpretation performed.    Details: negative CT head and C spine  Risk OTC drugs. Risk Details: Well appearing.  Stable for discharge with close follow up.     Final Clinical Impression(s) / ED Diagnoses Final diagnoses:  None   Return for intractable cough, coughing up blood, fevers > 100.4 unrelieved by medication, shortness of breath, intractable vomiting, chest pain, shortness of breath, weakness, numbness, changes in speech, facial asymmetry, abdominal pain, passing out, Inability to tolerate liquids or food, cough, altered mental status or any concerns. No signs of systemic illness or infection. The patient is nontoxic-appearing on exam  and vital signs are within normal limits.  I have reviewed the triage vital signs and the nursing notes. Pertinent labs & imaging results that were available during my care of the patient were reviewed by me and considered in my medical decision making (see chart for details). After history, exam, and medical workup I feel the patient has been appropriately medically screened and is safe for discharge home. Pertinent diagnoses were discussed with the patient. Patient  was given return precautions.  Rx / DC Orders ED Discharge Orders     None         Qais Jowers, MD 01/08/22 6386

## 2022-01-08 NOTE — ED Notes (Signed)
[facility] called  

## 2022-01-08 NOTE — ED Triage Notes (Signed)
Pt BIB [facility] from home c/o a fall out of bed but unsure if he hit his head but is on eliquis. Pt has right sided deficits from a previous stroke. Pt is alert and oriented.

## 2022-01-09 ENCOUNTER — Other Ambulatory Visit: Payer: Self-pay

## 2022-01-09 ENCOUNTER — Telehealth: Payer: Self-pay | Admitting: Family Medicine

## 2022-01-09 DIAGNOSIS — R2689 Other abnormalities of gait and mobility: Secondary | ICD-10-CM

## 2022-01-09 DIAGNOSIS — Z7409 Other reduced mobility: Secondary | ICD-10-CM

## 2022-01-09 NOTE — Telephone Encounter (Signed)
Wife called & daughter in back ground asking to speak with Maudie Mercury or Dr. Redmond School regarding patient.  Please call

## 2022-01-10 NOTE — Telephone Encounter (Signed)
done

## 2022-01-11 ENCOUNTER — Telehealth: Payer: Self-pay | Admitting: Medical

## 2022-01-11 ENCOUNTER — Telehealth: Payer: Self-pay | Admitting: Family Medicine

## 2022-01-11 ENCOUNTER — Encounter: Payer: Self-pay | Admitting: Family Medicine

## 2022-01-11 DIAGNOSIS — Z7901 Long term (current) use of anticoagulants: Secondary | ICD-10-CM | POA: Diagnosis not present

## 2022-01-11 DIAGNOSIS — E119 Type 2 diabetes mellitus without complications: Secondary | ICD-10-CM | POA: Diagnosis not present

## 2022-01-11 DIAGNOSIS — E785 Hyperlipidemia, unspecified: Secondary | ICD-10-CM | POA: Diagnosis not present

## 2022-01-11 DIAGNOSIS — Z993 Dependence on wheelchair: Secondary | ICD-10-CM | POA: Diagnosis not present

## 2022-01-11 DIAGNOSIS — L819 Disorder of pigmentation, unspecified: Secondary | ICD-10-CM

## 2022-01-11 DIAGNOSIS — K5790 Diverticulosis of intestine, part unspecified, without perforation or abscess without bleeding: Secondary | ICD-10-CM | POA: Diagnosis not present

## 2022-01-11 DIAGNOSIS — I10 Essential (primary) hypertension: Secondary | ICD-10-CM | POA: Diagnosis not present

## 2022-01-11 DIAGNOSIS — I6529 Occlusion and stenosis of unspecified carotid artery: Secondary | ICD-10-CM | POA: Diagnosis not present

## 2022-01-11 DIAGNOSIS — L89612 Pressure ulcer of right heel, stage 2: Secondary | ICD-10-CM | POA: Diagnosis not present

## 2022-01-11 DIAGNOSIS — I69351 Hemiplegia and hemiparesis following cerebral infarction affecting right dominant side: Secondary | ICD-10-CM | POA: Diagnosis not present

## 2022-01-11 DIAGNOSIS — M7989 Other specified soft tissue disorders: Secondary | ICD-10-CM

## 2022-01-11 DIAGNOSIS — L97519 Non-pressure chronic ulcer of other part of right foot with unspecified severity: Secondary | ICD-10-CM

## 2022-01-11 DIAGNOSIS — F17210 Nicotine dependence, cigarettes, uncomplicated: Secondary | ICD-10-CM | POA: Diagnosis not present

## 2022-01-11 DIAGNOSIS — Z9181 History of falling: Secondary | ICD-10-CM | POA: Diagnosis not present

## 2022-01-11 DIAGNOSIS — Z4800 Encounter for change or removal of nonsurgical wound dressing: Secondary | ICD-10-CM | POA: Diagnosis not present

## 2022-01-11 NOTE — Telephone Encounter (Signed)
Spoke to Mrs. Cafarella and getting patient down the stairs is the issue as well. She will call me back if she wants to be sent to podiatry.

## 2022-01-11 NOTE — Telephone Encounter (Signed)
Received a call from Mickel Baas with [facility] concerning pt. She states that orders were for skilled nursing but she is requesting verbal orders for Physical Therapy Occupational Therapy and a Home Health aid. Please advise Mickel Baas at [phone].

## 2022-01-11 NOTE — Telephone Encounter (Signed)
After speaking with Dr. Redmond School, we would like to have an urgent referral to preferably wound care.  If they cannot get him in within a week then lets see if we can get a more urgent referral to podiatry.  For example if he could see wound care or podiatry before the end the week or Monday that would be great  Could scat bus or [name] transportation also help in this situation?  This is regarding new ulcer on the back of the right heel, history of stroke and limited mobility of the right side of the body,

## 2022-01-11 NOTE — Telephone Encounter (Signed)
Can we see how quickly we can get pt in for wound care (within a week) and then if not try podiatry. I can put in a referral once you find out something. They are limited on transportation so I am trying to find out if anyone in [place] does transportation services

## 2022-01-11 NOTE — Telephone Encounter (Signed)
Pt wife called and Is requesting a note stating that pt needs a note stating that pt is homebound and can not get to the [facility] to get his ID,   She would like it posted to his my chart so she does not have to come here to get it  I informed her that you was not in the office today

## 2022-01-11 NOTE — Telephone Encounter (Signed)
Spoke to pt's wife and daughter. They have an appointment here on 30th and wants to know can they get an appointment with podiatry for that afternoon like 2:30 or later for the same day as they have a ride already. They would like to do both visits same day. No morning visits

## 2022-01-13 ENCOUNTER — Telehealth: Payer: Self-pay

## 2022-01-13 NOTE — Telephone Encounter (Signed)
Mickel Baas called requesting verbal for pt to have PT, OT and home health aide. Per Dr. Redmond School verbal was ok and laura was advised. She also advised that the hospital bed evaluation will be done by PT and they will advise of when completed.  [place]

## 2022-01-14 DIAGNOSIS — I6529 Occlusion and stenosis of unspecified carotid artery: Secondary | ICD-10-CM | POA: Diagnosis not present

## 2022-01-14 DIAGNOSIS — I10 Essential (primary) hypertension: Secondary | ICD-10-CM | POA: Diagnosis not present

## 2022-01-14 DIAGNOSIS — E785 Hyperlipidemia, unspecified: Secondary | ICD-10-CM | POA: Diagnosis not present

## 2022-01-14 DIAGNOSIS — E119 Type 2 diabetes mellitus without complications: Secondary | ICD-10-CM | POA: Diagnosis not present

## 2022-01-14 DIAGNOSIS — I69351 Hemiplegia and hemiparesis following cerebral infarction affecting right dominant side: Secondary | ICD-10-CM | POA: Diagnosis not present

## 2022-01-14 DIAGNOSIS — L89612 Pressure ulcer of right heel, stage 2: Secondary | ICD-10-CM | POA: Diagnosis not present

## 2022-01-16 DIAGNOSIS — E785 Hyperlipidemia, unspecified: Secondary | ICD-10-CM | POA: Diagnosis not present

## 2022-01-16 DIAGNOSIS — E119 Type 2 diabetes mellitus without complications: Secondary | ICD-10-CM | POA: Diagnosis not present

## 2022-01-16 DIAGNOSIS — L89612 Pressure ulcer of right heel, stage 2: Secondary | ICD-10-CM | POA: Diagnosis not present

## 2022-01-16 DIAGNOSIS — I6529 Occlusion and stenosis of unspecified carotid artery: Secondary | ICD-10-CM | POA: Diagnosis not present

## 2022-01-16 DIAGNOSIS — I10 Essential (primary) hypertension: Secondary | ICD-10-CM | POA: Diagnosis not present

## 2022-01-16 DIAGNOSIS — I69351 Hemiplegia and hemiparesis following cerebral infarction affecting right dominant side: Secondary | ICD-10-CM | POA: Diagnosis not present

## 2022-01-16 NOTE — Telephone Encounter (Signed)
Richard Kent was advised of the ok. [place]

## 2022-01-17 ENCOUNTER — Encounter: Payer: Self-pay | Admitting: Family Medicine

## 2022-01-18 ENCOUNTER — Ambulatory Visit (INDEPENDENT_AMBULATORY_CARE_PROVIDER_SITE_OTHER): Payer: Medicare Other | Admitting: Podiatry

## 2022-01-18 ENCOUNTER — Encounter: Payer: Self-pay | Admitting: Family Medicine

## 2022-01-18 ENCOUNTER — Ambulatory Visit (INDEPENDENT_AMBULATORY_CARE_PROVIDER_SITE_OTHER): Payer: Medicare Other | Admitting: Family Medicine

## 2022-01-18 VITALS — BP 142/70 | Temp 99.3°F

## 2022-01-18 DIAGNOSIS — E118 Type 2 diabetes mellitus with unspecified complications: Secondary | ICD-10-CM

## 2022-01-18 DIAGNOSIS — I6523 Occlusion and stenosis of bilateral carotid arteries: Secondary | ICD-10-CM

## 2022-01-18 DIAGNOSIS — M7989 Other specified soft tissue disorders: Secondary | ICD-10-CM | POA: Diagnosis not present

## 2022-01-18 DIAGNOSIS — L97519 Non-pressure chronic ulcer of other part of right foot with unspecified severity: Secondary | ICD-10-CM | POA: Diagnosis not present

## 2022-01-18 DIAGNOSIS — L989 Disorder of the skin and subcutaneous tissue, unspecified: Secondary | ICD-10-CM | POA: Diagnosis not present

## 2022-01-18 DIAGNOSIS — L89612 Pressure ulcer of right heel, stage 2: Secondary | ICD-10-CM

## 2022-01-18 MED ORDER — ONETOUCH ULTRA 2 W/DEVICE KIT: 1.0000 | PACK | Freq: Every day | 0 refills | Status: DC

## 2022-01-18 MED ORDER — ONETOUCH ULTRA VI STRP: ORAL_STRIP | 12 refills | Status: DC

## 2022-01-18 NOTE — Progress Notes (Signed)
Subjective:  Patient ID: Richard Kent, male    DOB: 22-Aug-1928,  MRN: [id number]  Chief Complaint  Patient presents with   Diabetic Ulcer    88 y.o. male presents with the above complaint.  Patient presents with right heel decubitus ulceration with fat layer exposed.  Patient states that this has been present for quite some time.  He has not been very cooperative with offloading.  He is here with his caretaker today.  He denies any other acute complaints.  He has this present for last few weeks and months, little bit bigger.  0 out of 10 pain scale.  He is a diabetic   Review of Systems: Negative except as noted in the HPI. Denies N/V/F/Ch.  Past Medical History:  Diagnosis Date   Carotid stenosis    CVA (cerebral infarction) 2011   Diverticulosis    Hyperlipidemia    Hypertension    Kidney stone    Smoker    Stroke ([place]) 11/17/09    Current Outpatient Medications:    Accu-Chek Softclix Lancets lancets, Use as instructed, Disp: 200 each, Rfl: 12   acetaminophen (TYLENOL) 500 MG tablet, Take 500 mg by mouth every 6 (six) hours as needed., Disp: , Rfl:    amLODipine (NORVASC) 2.5 MG tablet, Take 1 tablet (2.5 mg total) by mouth daily., Disp: 90 tablet, Rfl: 3   apixaban (ELIQUIS) 5 MG TABS tablet, Take 1 tablet (5 mg total) by mouth 2 (two) times daily., Disp: 180 tablet, Rfl: 3   Ascorbic Acid (VITAMIN C PO), Take by mouth., Disp: , Rfl:    Blood Glucose Monitoring Suppl (ONE TOUCH ULTRA 2) w/Device KIT, 1 each by Does not apply route daily., Disp: 1 kit, Rfl: 0   cephALEXin (KEFLEX) 500 MG capsule, Take 1 capsule (500 mg total) by mouth 3 (three) times daily. (Patient not taking: Reported on 01/18/2022), Disp: 30 capsule, Rfl: 0   furosemide (LASIX) 20 MG tablet, Take 0.5 tablets (10 mg total) by mouth daily., Disp: 30 tablet, Rfl: 0   gabapentin (NEURONTIN) 100 MG capsule, Take 1 capsule (100 mg total) by mouth at bedtime. Will likely increase to 2 daily after a week or 2, Disp:  60 capsule, Rfl: 0   glucose blood (ONETOUCH ULTRA) test strip, Use as instructed, Disp: 100 each, Rfl: 12   metoprolol succinate (TOPROL XL) 25 MG 24 hr tablet, Take 0.5 tablets (12.5 mg total) by mouth daily. Hold if systolic blood pressure (top blood pressure number) less than 100 mmHg or heart rate less than 60 bpm (pulse)., Disp: 45 tablet, Rfl: 3   Multiple Vitamin (MULTIVITAMIN WITH MINERALS) TABS, Take 1 tablet by mouth every morning. Reported on 09/06/2015, Disp: , Rfl:    mupirocin ointment (BACTROBAN) 2 %, Apply 1 Application topically 2 (two) times daily., Disp: 22 g, Rfl: 0   simvastatin (ZOCOR) 20 MG tablet, Take 1 tablet (20 mg total) by mouth every evening., Disp: 90 tablet, Rfl: 3   VITAMIN D PO, Take by mouth., Disp: , Rfl:   Social History   Tobacco Use  Smoking Status Former   Packs/day: 2.00   Years: 20.00   Total pack years: 40.00   Types: Cigarettes   Quit date: 01/13/1972   Years since quitting: 50.0  Smokeless Tobacco Never    No Known Allergies Objective:  There were no vitals filed for this visit. There is no height or weight on file to calculate BMI. Constitutional Well developed. Well nourished.  Vascular Dorsalis pedis  pulses palpable bilaterally. Posterior tibial pulses palpable bilaterally. Capillary refill normal to all digits.  No cyanosis or clubbing noted. Pedal hair growth normal.  Neurologic Normal speech. Oriented to person, place, and time. Epicritic sensation to light touch grossly present bilaterally.  Dermatologic Right heel decubitus ulceration with fat layer exposed no redness noted granular wound bed noted no fibrotic tissue.  No purulent drainage noted.  Orthopedic: Normal joint ROM without pain or crepitus bilaterally. No visible deformities. No bony tenderness.   Radiographs: None Assessment:   1. Pressure ulcer of right heel, stage 2 ([place])   2. Type II diabetes mellitus with complication [facility])    Plan:  Patient was  evaluated and treated and all questions answered.  Right heel decubitus ulceration with exposed -Minimal debridement as the wound is primarily granular.  Encourage doing Betadine wet-to-dry dressing once a day.  Aggressive offloading. -Offloading shoes were given to the patient to be purchased from [employer].  Patient states understand will get them immediately. -If it continues to regress patient is a high risk of losing the limb.  He states understanding -He is unable to follow-up due to restrictive home schedule.  He is being followed by home health nurse.  If any foot and ankle issues arise or if he gets worse have asked him to come back and see me.  He states understanding  No follow-ups on file.

## 2022-01-18 NOTE — Progress Notes (Addendum)
   Subjective:    Patient ID: Richard Kent, male    DOB: [birth year], 88 y.o.   MRN: [id number]  HPI Check.  Since his last visit he has not started taking the antibiotic.  Wife was concerned about possible diarrhea because of this.  He is scheduled to see podiatry later today for further evaluation of the lesion on his right foot.  The lesion on the left leg seems to be dealing nicely.  He has been seen by PT OT and they did recommend a sliding board for transfers as well as a wide adult sized wheelchair cushion and transport chair.  He does have a mobility limitation impairing his ability to use a cane, crutches or walker to resolve his mobility issues.  His wife is providing assistance.  She can help with the transport chair.  He does not have the upper body strength to handle a standard wheelchair They also state that the Neurontin is working much better for his back pain .  Foot pain ulcer gave his medicine hold on the He also needs diabetes supplies. Review of Systems     Objective:   Physical Exam Back shows a 4 cm round erythematous growth in the midportion of his back.  Exam of the left leg does show the lesion to be healing well.  Right heel could not be fully assessed because of the area but minimal drainage was noted.  The area is very erythematous.  It does not warm.       Assessment & Plan:  Foot swelling  Ulcer of right foot, unspecified ulcer stage ([facility])  Type II diabetes mellitus with complication ([place]) - Plan: glucose blood (ONETOUCH ULTRA) test strip, Blood Glucose Monitoring Suppl (ONE TOUCH ULTRA 2) w/Device KIT  Skin lesion of back

## 2022-01-19 ENCOUNTER — Telehealth: Payer: Self-pay

## 2022-01-19 ENCOUNTER — Other Ambulatory Visit: Payer: Self-pay

## 2022-01-19 DIAGNOSIS — M7989 Other specified soft tissue disorders: Secondary | ICD-10-CM

## 2022-01-19 DIAGNOSIS — I6529 Occlusion and stenosis of unspecified carotid artery: Secondary | ICD-10-CM | POA: Diagnosis not present

## 2022-01-19 DIAGNOSIS — L97519 Non-pressure chronic ulcer of other part of right foot with unspecified severity: Secondary | ICD-10-CM

## 2022-01-19 DIAGNOSIS — E785 Hyperlipidemia, unspecified: Secondary | ICD-10-CM | POA: Diagnosis not present

## 2022-01-19 DIAGNOSIS — Z7409 Other reduced mobility: Secondary | ICD-10-CM

## 2022-01-19 DIAGNOSIS — R2689 Other abnormalities of gait and mobility: Secondary | ICD-10-CM

## 2022-01-19 DIAGNOSIS — I10 Essential (primary) hypertension: Secondary | ICD-10-CM | POA: Diagnosis not present

## 2022-01-19 DIAGNOSIS — L89612 Pressure ulcer of right heel, stage 2: Secondary | ICD-10-CM | POA: Diagnosis not present

## 2022-01-19 DIAGNOSIS — I69351 Hemiplegia and hemiparesis following cerebral infarction affecting right dominant side: Secondary | ICD-10-CM | POA: Diagnosis not present

## 2022-01-19 DIAGNOSIS — L989 Disorder of the skin and subcutaneous tissue, unspecified: Secondary | ICD-10-CM

## 2022-01-19 DIAGNOSIS — E119 Type 2 diabetes mellitus without complications: Secondary | ICD-10-CM | POA: Diagnosis not present

## 2022-01-19 NOTE — Telephone Encounter (Signed)
Pt. Wife called stating she wanted to know if it was ok to give him 2 of the gabapentin at night randomly just when he is having more pain or should she do it every night. The prescription stats may increase to two at bedtime and she gave him two last night because he had more pain. Shane sent in the Gabapentin the other day but Kendrick Fries is his PCP.

## 2022-01-20 DIAGNOSIS — E119 Type 2 diabetes mellitus without complications: Secondary | ICD-10-CM | POA: Diagnosis not present

## 2022-01-20 DIAGNOSIS — I69351 Hemiplegia and hemiparesis following cerebral infarction affecting right dominant side: Secondary | ICD-10-CM | POA: Diagnosis not present

## 2022-01-20 DIAGNOSIS — L89612 Pressure ulcer of right heel, stage 2: Secondary | ICD-10-CM | POA: Diagnosis not present

## 2022-01-20 DIAGNOSIS — I6529 Occlusion and stenosis of unspecified carotid artery: Secondary | ICD-10-CM | POA: Diagnosis not present

## 2022-01-20 DIAGNOSIS — E785 Hyperlipidemia, unspecified: Secondary | ICD-10-CM | POA: Diagnosis not present

## 2022-01-20 DIAGNOSIS — I10 Essential (primary) hypertension: Secondary | ICD-10-CM | POA: Diagnosis not present

## 2022-01-24 ENCOUNTER — Telehealth: Payer: Self-pay | Admitting: Family Medicine

## 2022-01-24 DIAGNOSIS — I69351 Hemiplegia and hemiparesis following cerebral infarction affecting right dominant side: Secondary | ICD-10-CM | POA: Diagnosis not present

## 2022-01-24 DIAGNOSIS — L89612 Pressure ulcer of right heel, stage 2: Secondary | ICD-10-CM | POA: Diagnosis not present

## 2022-01-24 DIAGNOSIS — I10 Essential (primary) hypertension: Secondary | ICD-10-CM | POA: Diagnosis not present

## 2022-01-24 DIAGNOSIS — I6529 Occlusion and stenosis of unspecified carotid artery: Secondary | ICD-10-CM | POA: Diagnosis not present

## 2022-01-24 DIAGNOSIS — E785 Hyperlipidemia, unspecified: Secondary | ICD-10-CM | POA: Diagnosis not present

## 2022-01-24 DIAGNOSIS — E119 Type 2 diabetes mellitus without complications: Secondary | ICD-10-CM | POA: Diagnosis not present

## 2022-01-24 NOTE — Telephone Encounter (Signed)
Judson Roch from [facility] says she needs request for womb care orders.   She states he has a left leg small skin tare and she would like to use womb cleaner, apply an antibiotic and cover with dressers every 3 days.  Also on his right foot he has a blister and she would like to use the womb cleaner, antibiotic ointment, and [place] Telfa pads when needed.  Also for the lesion on his back, she needs an order to keep it clean and dry so she can take a look at it.  Also he had 2 falls, one on Friday and the other on Saturday and they have installed a chair lift on his stairs.  Also he has had no bowel movement in 3 days, only gas when he is on the toilet and she wants to know if it is ok to give him a stool softener.   Provided a call back number [phone]

## 2022-01-25 ENCOUNTER — Encounter: Payer: Self-pay | Admitting: Internal Medicine

## 2022-01-25 ENCOUNTER — Telehealth: Payer: Self-pay | Admitting: Family Medicine

## 2022-01-25 DIAGNOSIS — L89612 Pressure ulcer of right heel, stage 2: Secondary | ICD-10-CM | POA: Diagnosis not present

## 2022-01-25 DIAGNOSIS — I6529 Occlusion and stenosis of unspecified carotid artery: Secondary | ICD-10-CM | POA: Diagnosis not present

## 2022-01-25 DIAGNOSIS — I69351 Hemiplegia and hemiparesis following cerebral infarction affecting right dominant side: Secondary | ICD-10-CM | POA: Diagnosis not present

## 2022-01-25 DIAGNOSIS — E119 Type 2 diabetes mellitus without complications: Secondary | ICD-10-CM | POA: Diagnosis not present

## 2022-01-25 DIAGNOSIS — I10 Essential (primary) hypertension: Secondary | ICD-10-CM | POA: Diagnosis not present

## 2022-01-25 DIAGNOSIS — E785 Hyperlipidemia, unspecified: Secondary | ICD-10-CM | POA: Diagnosis not present

## 2022-01-25 NOTE — Telephone Encounter (Signed)
Done KH 

## 2022-01-25 NOTE — Telephone Encounter (Signed)
PT ASSISTANCE ELIQUIS application done & approved til 05/21/22, daughter informed

## 2022-01-26 DIAGNOSIS — L89612 Pressure ulcer of right heel, stage 2: Secondary | ICD-10-CM | POA: Diagnosis not present

## 2022-01-26 DIAGNOSIS — E785 Hyperlipidemia, unspecified: Secondary | ICD-10-CM | POA: Diagnosis not present

## 2022-01-26 DIAGNOSIS — I10 Essential (primary) hypertension: Secondary | ICD-10-CM | POA: Diagnosis not present

## 2022-01-26 DIAGNOSIS — I69351 Hemiplegia and hemiparesis following cerebral infarction affecting right dominant side: Secondary | ICD-10-CM | POA: Diagnosis not present

## 2022-01-26 DIAGNOSIS — L989 Disorder of the skin and subcutaneous tissue, unspecified: Secondary | ICD-10-CM | POA: Diagnosis not present

## 2022-01-26 DIAGNOSIS — E119 Type 2 diabetes mellitus without complications: Secondary | ICD-10-CM | POA: Diagnosis not present

## 2022-01-26 DIAGNOSIS — I6529 Occlusion and stenosis of unspecified carotid artery: Secondary | ICD-10-CM | POA: Diagnosis not present

## 2022-01-27 DIAGNOSIS — E785 Hyperlipidemia, unspecified: Secondary | ICD-10-CM | POA: Diagnosis not present

## 2022-01-27 DIAGNOSIS — E119 Type 2 diabetes mellitus without complications: Secondary | ICD-10-CM | POA: Diagnosis not present

## 2022-01-27 DIAGNOSIS — I10 Essential (primary) hypertension: Secondary | ICD-10-CM | POA: Diagnosis not present

## 2022-01-27 DIAGNOSIS — I6529 Occlusion and stenosis of unspecified carotid artery: Secondary | ICD-10-CM | POA: Diagnosis not present

## 2022-01-27 DIAGNOSIS — L89612 Pressure ulcer of right heel, stage 2: Secondary | ICD-10-CM | POA: Diagnosis not present

## 2022-01-27 DIAGNOSIS — I69351 Hemiplegia and hemiparesis following cerebral infarction affecting right dominant side: Secondary | ICD-10-CM | POA: Diagnosis not present

## 2022-01-27 NOTE — Telephone Encounter (Signed)
Called Spoke with [facility] t# [phone], they called to verify the prescription for Eliquis, and they will ship Monday

## 2022-01-30 DIAGNOSIS — E119 Type 2 diabetes mellitus without complications: Secondary | ICD-10-CM | POA: Diagnosis not present

## 2022-01-30 DIAGNOSIS — E785 Hyperlipidemia, unspecified: Secondary | ICD-10-CM | POA: Diagnosis not present

## 2022-01-30 DIAGNOSIS — I69351 Hemiplegia and hemiparesis following cerebral infarction affecting right dominant side: Secondary | ICD-10-CM | POA: Diagnosis not present

## 2022-01-30 DIAGNOSIS — I10 Essential (primary) hypertension: Secondary | ICD-10-CM | POA: Diagnosis not present

## 2022-01-30 DIAGNOSIS — I6529 Occlusion and stenosis of unspecified carotid artery: Secondary | ICD-10-CM | POA: Diagnosis not present

## 2022-01-30 DIAGNOSIS — L89612 Pressure ulcer of right heel, stage 2: Secondary | ICD-10-CM | POA: Diagnosis not present

## 2022-01-31 ENCOUNTER — Telehealth: Payer: Self-pay | Admitting: Family Medicine

## 2022-01-31 NOTE — Telephone Encounter (Signed)
Form was sent over thru My Chart to be completed on 9/11, please call with status of form

## 2022-01-31 NOTE — Telephone Encounter (Signed)
Lvm for son to call back [facility]

## 2022-02-01 ENCOUNTER — Telehealth: Payer: Self-pay | Admitting: *Deleted

## 2022-02-01 ENCOUNTER — Telehealth: Payer: Self-pay

## 2022-02-01 DIAGNOSIS — I10 Essential (primary) hypertension: Secondary | ICD-10-CM | POA: Diagnosis not present

## 2022-02-01 DIAGNOSIS — E119 Type 2 diabetes mellitus without complications: Secondary | ICD-10-CM | POA: Diagnosis not present

## 2022-02-01 DIAGNOSIS — I69351 Hemiplegia and hemiparesis following cerebral infarction affecting right dominant side: Secondary | ICD-10-CM | POA: Diagnosis not present

## 2022-02-01 DIAGNOSIS — L89612 Pressure ulcer of right heel, stage 2: Secondary | ICD-10-CM | POA: Diagnosis not present

## 2022-02-01 DIAGNOSIS — I6529 Occlusion and stenosis of unspecified carotid artery: Secondary | ICD-10-CM | POA: Diagnosis not present

## 2022-02-01 DIAGNOSIS — E785 Hyperlipidemia, unspecified: Secondary | ICD-10-CM | POA: Diagnosis not present

## 2022-02-01 MED ORDER — MUPIROCIN 2 % EX OINT: 1.0000 | TOPICAL_OINTMENT | Freq: Two times a day (BID) | CUTANEOUS | 2 refills | Status: DC

## 2022-02-01 NOTE — Telephone Encounter (Signed)
Occupational Therapist w/ [facility] requesting verbal orders for patient to allow him to weight bear just to transfer safely. Family and caregiver will fully understand is having a difficult time w/ non weight bearing transfers to bed and commode, fell twice. The grandson has been lifting him currently.

## 2022-02-01 NOTE — Telephone Encounter (Signed)
Called and spoke with Manuela Schwartz, OT,giving verbal orders for weightbearing transfers to bed and commode, verbalized understanding.

## 2022-02-01 NOTE — Telephone Encounter (Signed)
Pt. Son called wanting to know if you could refill his Bactorban 2% to [place].

## 2022-02-02 ENCOUNTER — Encounter: Payer: Self-pay | Admitting: Family Medicine

## 2022-02-02 ENCOUNTER — Telehealth: Payer: Self-pay

## 2022-02-02 DIAGNOSIS — E785 Hyperlipidemia, unspecified: Secondary | ICD-10-CM | POA: Diagnosis not present

## 2022-02-02 DIAGNOSIS — I6529 Occlusion and stenosis of unspecified carotid artery: Secondary | ICD-10-CM | POA: Diagnosis not present

## 2022-02-02 DIAGNOSIS — I69351 Hemiplegia and hemiparesis following cerebral infarction affecting right dominant side: Secondary | ICD-10-CM | POA: Diagnosis not present

## 2022-02-02 DIAGNOSIS — L89612 Pressure ulcer of right heel, stage 2: Secondary | ICD-10-CM | POA: Diagnosis not present

## 2022-02-02 DIAGNOSIS — E119 Type 2 diabetes mellitus without complications: Secondary | ICD-10-CM | POA: Diagnosis not present

## 2022-02-02 DIAGNOSIS — I10 Essential (primary) hypertension: Secondary | ICD-10-CM | POA: Diagnosis not present

## 2022-02-02 NOTE — Telephone Encounter (Signed)
Form needs a virtual visit. Lvm for daughter that this is needed before form can be fill out. [place]

## 2022-02-02 NOTE — Telephone Encounter (Signed)
Spoke to [facility] and will have the note changed for pt DME supplies. Forms can be sent to pt daughter's email  [email or web address]

## 2022-02-03 DIAGNOSIS — E119 Type 2 diabetes mellitus without complications: Secondary | ICD-10-CM | POA: Diagnosis not present

## 2022-02-03 DIAGNOSIS — I10 Essential (primary) hypertension: Secondary | ICD-10-CM | POA: Diagnosis not present

## 2022-02-03 DIAGNOSIS — E785 Hyperlipidemia, unspecified: Secondary | ICD-10-CM | POA: Diagnosis not present

## 2022-02-03 DIAGNOSIS — I6529 Occlusion and stenosis of unspecified carotid artery: Secondary | ICD-10-CM | POA: Diagnosis not present

## 2022-02-03 DIAGNOSIS — L89612 Pressure ulcer of right heel, stage 2: Secondary | ICD-10-CM | POA: Diagnosis not present

## 2022-02-03 DIAGNOSIS — I69351 Hemiplegia and hemiparesis following cerebral infarction affecting right dominant side: Secondary | ICD-10-CM | POA: Diagnosis not present

## 2022-02-04 DIAGNOSIS — I69351 Hemiplegia and hemiparesis following cerebral infarction affecting right dominant side: Secondary | ICD-10-CM | POA: Diagnosis not present

## 2022-02-04 DIAGNOSIS — I10 Essential (primary) hypertension: Secondary | ICD-10-CM | POA: Diagnosis not present

## 2022-02-04 DIAGNOSIS — E785 Hyperlipidemia, unspecified: Secondary | ICD-10-CM | POA: Diagnosis not present

## 2022-02-04 DIAGNOSIS — L89612 Pressure ulcer of right heel, stage 2: Secondary | ICD-10-CM | POA: Diagnosis not present

## 2022-02-04 DIAGNOSIS — E119 Type 2 diabetes mellitus without complications: Secondary | ICD-10-CM | POA: Diagnosis not present

## 2022-02-04 DIAGNOSIS — I6529 Occlusion and stenosis of unspecified carotid artery: Secondary | ICD-10-CM | POA: Diagnosis not present

## 2022-02-06 ENCOUNTER — Telehealth (INDEPENDENT_AMBULATORY_CARE_PROVIDER_SITE_OTHER): Payer: Medicare Other | Admitting: Family Medicine

## 2022-02-06 ENCOUNTER — Encounter: Payer: Self-pay | Admitting: Family Medicine

## 2022-02-06 VITALS — BP 146/79 | HR 86

## 2022-02-06 DIAGNOSIS — I69959 Hemiplegia and hemiparesis following unspecified cerebrovascular disease affecting unspecified side: Secondary | ICD-10-CM

## 2022-02-06 DIAGNOSIS — Z8673 Personal history of transient ischemic attack (TIA), and cerebral infarction without residual deficits: Secondary | ICD-10-CM | POA: Diagnosis not present

## 2022-02-06 DIAGNOSIS — E785 Hyperlipidemia, unspecified: Secondary | ICD-10-CM | POA: Diagnosis not present

## 2022-02-06 DIAGNOSIS — E119 Type 2 diabetes mellitus without complications: Secondary | ICD-10-CM | POA: Diagnosis not present

## 2022-02-06 DIAGNOSIS — I10 Essential (primary) hypertension: Secondary | ICD-10-CM | POA: Diagnosis not present

## 2022-02-06 DIAGNOSIS — I6523 Occlusion and stenosis of bilateral carotid arteries: Secondary | ICD-10-CM | POA: Diagnosis not present

## 2022-02-06 DIAGNOSIS — I69351 Hemiplegia and hemiparesis following cerebral infarction affecting right dominant side: Secondary | ICD-10-CM | POA: Diagnosis not present

## 2022-02-06 DIAGNOSIS — L89612 Pressure ulcer of right heel, stage 2: Secondary | ICD-10-CM | POA: Diagnosis not present

## 2022-02-06 DIAGNOSIS — I6529 Occlusion and stenosis of unspecified carotid artery: Secondary | ICD-10-CM | POA: Diagnosis not present

## 2022-02-06 NOTE — Progress Notes (Signed)
   Subjective:    Patient ID: Richard Kent, male    DOB: 1928-10-14, 88 y.o.   MRN: [id number]  HPI Documentation for virtual audio telecommunications through [place] encounter: Unable to get video communication to work The patient was located at home. 2 patient identifiers used.  The provider was located in the office. The patient did consent to this visit and is aware of possible charges through their insurance for this visit. The other persons participating in this telemedicine service were none. Time spent on call was 5 minutes and in review of previous records >20 minutes total for counseling and coordination of care. This virtual service is not related to other E/M service within previous 7 days.  T visit is to assess his housebound status for regular aid and attendance.  He has a history of CVA in June 2011 and subsequent hemiplegia.  His wife has been taking care of him since the CVA.  He now has right-sided hemiplegia unable to use his right arm and can use his right leg but to a much lesser extent.  He is not able to walk on it.  He needs help with his ADLs including bathing, showering, dressing.  He uses a wheelchair to get around.  He only leaves the house to go to medical appointments.  His wife helps with medication management and with other ADLs.  Most recently he was diagnosed with type 2 diabetes in the early stages.  Review of Systems     Objective:   Physical Exam Patient not examined       Assessment & Plan:  History of CVA (cerebrovascular accident)  Hemiplegia as late effect of cerebrovascular disease, unspecified cerebrovascular disease type, unspecified hemiplegia type, unspecified laterality ([place])  New onset type 2 diabetes mellitus ([place]) Appropriate paperwork was filled out.

## 2022-02-07 DIAGNOSIS — I10 Essential (primary) hypertension: Secondary | ICD-10-CM | POA: Diagnosis not present

## 2022-02-07 DIAGNOSIS — I69351 Hemiplegia and hemiparesis following cerebral infarction affecting right dominant side: Secondary | ICD-10-CM | POA: Diagnosis not present

## 2022-02-07 DIAGNOSIS — E119 Type 2 diabetes mellitus without complications: Secondary | ICD-10-CM | POA: Diagnosis not present

## 2022-02-07 DIAGNOSIS — I6529 Occlusion and stenosis of unspecified carotid artery: Secondary | ICD-10-CM | POA: Diagnosis not present

## 2022-02-07 DIAGNOSIS — L89612 Pressure ulcer of right heel, stage 2: Secondary | ICD-10-CM | POA: Diagnosis not present

## 2022-02-07 DIAGNOSIS — E785 Hyperlipidemia, unspecified: Secondary | ICD-10-CM | POA: Diagnosis not present

## 2022-02-08 DIAGNOSIS — E119 Type 2 diabetes mellitus without complications: Secondary | ICD-10-CM | POA: Diagnosis not present

## 2022-02-08 DIAGNOSIS — L89612 Pressure ulcer of right heel, stage 2: Secondary | ICD-10-CM | POA: Diagnosis not present

## 2022-02-08 DIAGNOSIS — I10 Essential (primary) hypertension: Secondary | ICD-10-CM | POA: Diagnosis not present

## 2022-02-08 DIAGNOSIS — I69351 Hemiplegia and hemiparesis following cerebral infarction affecting right dominant side: Secondary | ICD-10-CM | POA: Diagnosis not present

## 2022-02-08 DIAGNOSIS — I6529 Occlusion and stenosis of unspecified carotid artery: Secondary | ICD-10-CM | POA: Diagnosis not present

## 2022-02-08 DIAGNOSIS — E785 Hyperlipidemia, unspecified: Secondary | ICD-10-CM | POA: Diagnosis not present

## 2022-02-10 DIAGNOSIS — I10 Essential (primary) hypertension: Secondary | ICD-10-CM | POA: Diagnosis not present

## 2022-02-10 DIAGNOSIS — I6529 Occlusion and stenosis of unspecified carotid artery: Secondary | ICD-10-CM | POA: Diagnosis not present

## 2022-02-10 DIAGNOSIS — Z993 Dependence on wheelchair: Secondary | ICD-10-CM | POA: Diagnosis not present

## 2022-02-10 DIAGNOSIS — Z9181 History of falling: Secondary | ICD-10-CM | POA: Diagnosis not present

## 2022-02-10 DIAGNOSIS — Z4800 Encounter for change or removal of nonsurgical wound dressing: Secondary | ICD-10-CM | POA: Diagnosis not present

## 2022-02-10 DIAGNOSIS — Z7901 Long term (current) use of anticoagulants: Secondary | ICD-10-CM | POA: Diagnosis not present

## 2022-02-10 DIAGNOSIS — E785 Hyperlipidemia, unspecified: Secondary | ICD-10-CM | POA: Diagnosis not present

## 2022-02-10 DIAGNOSIS — E119 Type 2 diabetes mellitus without complications: Secondary | ICD-10-CM | POA: Diagnosis not present

## 2022-02-10 DIAGNOSIS — F17210 Nicotine dependence, cigarettes, uncomplicated: Secondary | ICD-10-CM | POA: Diagnosis not present

## 2022-02-10 DIAGNOSIS — I69351 Hemiplegia and hemiparesis following cerebral infarction affecting right dominant side: Secondary | ICD-10-CM | POA: Diagnosis not present

## 2022-02-10 DIAGNOSIS — L89612 Pressure ulcer of right heel, stage 2: Secondary | ICD-10-CM | POA: Diagnosis not present

## 2022-02-10 DIAGNOSIS — K5790 Diverticulosis of intestine, part unspecified, without perforation or abscess without bleeding: Secondary | ICD-10-CM | POA: Diagnosis not present

## 2022-02-13 ENCOUNTER — Telehealth: Payer: Self-pay | Admitting: Family Medicine

## 2022-02-13 MED ORDER — GABAPENTIN 100 MG PO CAPS: 100.0000 mg | ORAL_CAPSULE | Freq: Every day | ORAL | 0 refills | Status: DC

## 2022-02-13 NOTE — Telephone Encounter (Signed)
Pt wife asks if you can give her a call. She has questions about Azekiel's medication.

## 2022-02-13 NOTE — Telephone Encounter (Signed)
Pt. Wife called wanting a refill on his gabapentin and she wanted to know if you wanted him to stay on the keflex or not?

## 2022-02-14 DIAGNOSIS — I10 Essential (primary) hypertension: Secondary | ICD-10-CM | POA: Diagnosis not present

## 2022-02-14 DIAGNOSIS — L89612 Pressure ulcer of right heel, stage 2: Secondary | ICD-10-CM | POA: Diagnosis not present

## 2022-02-14 DIAGNOSIS — I6529 Occlusion and stenosis of unspecified carotid artery: Secondary | ICD-10-CM | POA: Diagnosis not present

## 2022-02-14 DIAGNOSIS — I69351 Hemiplegia and hemiparesis following cerebral infarction affecting right dominant side: Secondary | ICD-10-CM | POA: Diagnosis not present

## 2022-02-14 DIAGNOSIS — E119 Type 2 diabetes mellitus without complications: Secondary | ICD-10-CM | POA: Diagnosis not present

## 2022-02-14 DIAGNOSIS — E785 Hyperlipidemia, unspecified: Secondary | ICD-10-CM | POA: Diagnosis not present

## 2022-02-16 DIAGNOSIS — I6529 Occlusion and stenosis of unspecified carotid artery: Secondary | ICD-10-CM | POA: Diagnosis not present

## 2022-02-16 DIAGNOSIS — I10 Essential (primary) hypertension: Secondary | ICD-10-CM | POA: Diagnosis not present

## 2022-02-16 DIAGNOSIS — L89612 Pressure ulcer of right heel, stage 2: Secondary | ICD-10-CM | POA: Diagnosis not present

## 2022-02-16 DIAGNOSIS — E785 Hyperlipidemia, unspecified: Secondary | ICD-10-CM | POA: Diagnosis not present

## 2022-02-16 DIAGNOSIS — E119 Type 2 diabetes mellitus without complications: Secondary | ICD-10-CM | POA: Diagnosis not present

## 2022-02-16 DIAGNOSIS — I69351 Hemiplegia and hemiparesis following cerebral infarction affecting right dominant side: Secondary | ICD-10-CM | POA: Diagnosis not present

## 2022-02-17 DIAGNOSIS — I10 Essential (primary) hypertension: Secondary | ICD-10-CM | POA: Diagnosis not present

## 2022-02-17 DIAGNOSIS — I69351 Hemiplegia and hemiparesis following cerebral infarction affecting right dominant side: Secondary | ICD-10-CM | POA: Diagnosis not present

## 2022-02-17 DIAGNOSIS — L89612 Pressure ulcer of right heel, stage 2: Secondary | ICD-10-CM | POA: Diagnosis not present

## 2022-02-17 DIAGNOSIS — E119 Type 2 diabetes mellitus without complications: Secondary | ICD-10-CM | POA: Diagnosis not present

## 2022-02-17 DIAGNOSIS — E785 Hyperlipidemia, unspecified: Secondary | ICD-10-CM | POA: Diagnosis not present

## 2022-02-17 DIAGNOSIS — I6529 Occlusion and stenosis of unspecified carotid artery: Secondary | ICD-10-CM | POA: Diagnosis not present

## 2022-02-20 DIAGNOSIS — E785 Hyperlipidemia, unspecified: Secondary | ICD-10-CM | POA: Diagnosis not present

## 2022-02-20 DIAGNOSIS — I6529 Occlusion and stenosis of unspecified carotid artery: Secondary | ICD-10-CM | POA: Diagnosis not present

## 2022-02-20 DIAGNOSIS — L89612 Pressure ulcer of right heel, stage 2: Secondary | ICD-10-CM | POA: Diagnosis not present

## 2022-02-20 DIAGNOSIS — I69351 Hemiplegia and hemiparesis following cerebral infarction affecting right dominant side: Secondary | ICD-10-CM | POA: Diagnosis not present

## 2022-02-20 DIAGNOSIS — E119 Type 2 diabetes mellitus without complications: Secondary | ICD-10-CM | POA: Diagnosis not present

## 2022-02-20 DIAGNOSIS — I10 Essential (primary) hypertension: Secondary | ICD-10-CM | POA: Diagnosis not present

## 2022-02-23 DIAGNOSIS — I69351 Hemiplegia and hemiparesis following cerebral infarction affecting right dominant side: Secondary | ICD-10-CM | POA: Diagnosis not present

## 2022-02-23 DIAGNOSIS — I6529 Occlusion and stenosis of unspecified carotid artery: Secondary | ICD-10-CM | POA: Diagnosis not present

## 2022-02-23 DIAGNOSIS — E785 Hyperlipidemia, unspecified: Secondary | ICD-10-CM | POA: Diagnosis not present

## 2022-02-23 DIAGNOSIS — I10 Essential (primary) hypertension: Secondary | ICD-10-CM | POA: Diagnosis not present

## 2022-02-23 DIAGNOSIS — L89612 Pressure ulcer of right heel, stage 2: Secondary | ICD-10-CM | POA: Diagnosis not present

## 2022-02-23 DIAGNOSIS — E119 Type 2 diabetes mellitus without complications: Secondary | ICD-10-CM | POA: Diagnosis not present

## 2022-02-24 DIAGNOSIS — I10 Essential (primary) hypertension: Secondary | ICD-10-CM | POA: Diagnosis not present

## 2022-02-24 DIAGNOSIS — I6529 Occlusion and stenosis of unspecified carotid artery: Secondary | ICD-10-CM | POA: Diagnosis not present

## 2022-02-24 DIAGNOSIS — L89612 Pressure ulcer of right heel, stage 2: Secondary | ICD-10-CM | POA: Diagnosis not present

## 2022-02-24 DIAGNOSIS — I69351 Hemiplegia and hemiparesis following cerebral infarction affecting right dominant side: Secondary | ICD-10-CM | POA: Diagnosis not present

## 2022-02-24 DIAGNOSIS — E119 Type 2 diabetes mellitus without complications: Secondary | ICD-10-CM | POA: Diagnosis not present

## 2022-02-24 DIAGNOSIS — E785 Hyperlipidemia, unspecified: Secondary | ICD-10-CM | POA: Diagnosis not present

## 2022-02-27 DIAGNOSIS — E119 Type 2 diabetes mellitus without complications: Secondary | ICD-10-CM | POA: Diagnosis not present

## 2022-02-27 DIAGNOSIS — I10 Essential (primary) hypertension: Secondary | ICD-10-CM | POA: Diagnosis not present

## 2022-02-27 DIAGNOSIS — I69351 Hemiplegia and hemiparesis following cerebral infarction affecting right dominant side: Secondary | ICD-10-CM | POA: Diagnosis not present

## 2022-02-27 DIAGNOSIS — E785 Hyperlipidemia, unspecified: Secondary | ICD-10-CM | POA: Diagnosis not present

## 2022-02-27 DIAGNOSIS — I6529 Occlusion and stenosis of unspecified carotid artery: Secondary | ICD-10-CM | POA: Diagnosis not present

## 2022-02-27 DIAGNOSIS — L89612 Pressure ulcer of right heel, stage 2: Secondary | ICD-10-CM | POA: Diagnosis not present

## 2022-02-28 ENCOUNTER — Encounter: Payer: Self-pay | Admitting: Internal Medicine

## 2022-03-01 ENCOUNTER — Ambulatory Visit: Payer: Medicare Other | Admitting: Podiatry

## 2022-03-01 DIAGNOSIS — E785 Hyperlipidemia, unspecified: Secondary | ICD-10-CM | POA: Diagnosis not present

## 2022-03-01 DIAGNOSIS — I10 Essential (primary) hypertension: Secondary | ICD-10-CM | POA: Diagnosis not present

## 2022-03-01 DIAGNOSIS — I6529 Occlusion and stenosis of unspecified carotid artery: Secondary | ICD-10-CM | POA: Diagnosis not present

## 2022-03-01 DIAGNOSIS — E119 Type 2 diabetes mellitus without complications: Secondary | ICD-10-CM | POA: Diagnosis not present

## 2022-03-01 DIAGNOSIS — L89612 Pressure ulcer of right heel, stage 2: Secondary | ICD-10-CM | POA: Diagnosis not present

## 2022-03-01 DIAGNOSIS — I69351 Hemiplegia and hemiparesis following cerebral infarction affecting right dominant side: Secondary | ICD-10-CM | POA: Diagnosis not present

## 2022-03-02 ENCOUNTER — Telehealth: Payer: Self-pay | Admitting: Family Medicine

## 2022-03-02 DIAGNOSIS — E119 Type 2 diabetes mellitus without complications: Secondary | ICD-10-CM | POA: Diagnosis not present

## 2022-03-02 DIAGNOSIS — I6529 Occlusion and stenosis of unspecified carotid artery: Secondary | ICD-10-CM | POA: Diagnosis not present

## 2022-03-02 DIAGNOSIS — L89612 Pressure ulcer of right heel, stage 2: Secondary | ICD-10-CM | POA: Diagnosis not present

## 2022-03-02 DIAGNOSIS — E785 Hyperlipidemia, unspecified: Secondary | ICD-10-CM | POA: Diagnosis not present

## 2022-03-02 DIAGNOSIS — I69351 Hemiplegia and hemiparesis following cerebral infarction affecting right dominant side: Secondary | ICD-10-CM | POA: Diagnosis not present

## 2022-03-02 DIAGNOSIS — I10 Essential (primary) hypertension: Secondary | ICD-10-CM | POA: Diagnosis not present

## 2022-03-02 NOTE — Telephone Encounter (Signed)
Received a call from Judson Roch at [name]. She states that when she saw pt she was informed that pt had a redness on his bottom that was bothering him. After looking at it she believes he has a yeast issue.  She is requesting that nystatin powder be sent in for him. She states to send to pt's normal pharmacy.   ALSO  She is requesting to extend in home nursing care. She is requesting   1 time a week for 9 weeks.   Please advise Judson Roch at [phone].

## 2022-03-03 ENCOUNTER — Other Ambulatory Visit: Payer: Self-pay | Admitting: Medical

## 2022-03-03 NOTE — Telephone Encounter (Signed)
Done KH 

## 2022-03-06 DIAGNOSIS — I69351 Hemiplegia and hemiparesis following cerebral infarction affecting right dominant side: Secondary | ICD-10-CM | POA: Diagnosis not present

## 2022-03-06 DIAGNOSIS — L89612 Pressure ulcer of right heel, stage 2: Secondary | ICD-10-CM | POA: Diagnosis not present

## 2022-03-06 DIAGNOSIS — I6529 Occlusion and stenosis of unspecified carotid artery: Secondary | ICD-10-CM | POA: Diagnosis not present

## 2022-03-06 DIAGNOSIS — E785 Hyperlipidemia, unspecified: Secondary | ICD-10-CM | POA: Diagnosis not present

## 2022-03-06 DIAGNOSIS — E119 Type 2 diabetes mellitus without complications: Secondary | ICD-10-CM | POA: Diagnosis not present

## 2022-03-06 DIAGNOSIS — I10 Essential (primary) hypertension: Secondary | ICD-10-CM | POA: Diagnosis not present

## 2022-03-08 DIAGNOSIS — I6529 Occlusion and stenosis of unspecified carotid artery: Secondary | ICD-10-CM | POA: Diagnosis not present

## 2022-03-08 DIAGNOSIS — E785 Hyperlipidemia, unspecified: Secondary | ICD-10-CM | POA: Diagnosis not present

## 2022-03-08 DIAGNOSIS — E119 Type 2 diabetes mellitus without complications: Secondary | ICD-10-CM | POA: Diagnosis not present

## 2022-03-08 DIAGNOSIS — I69351 Hemiplegia and hemiparesis following cerebral infarction affecting right dominant side: Secondary | ICD-10-CM | POA: Diagnosis not present

## 2022-03-08 DIAGNOSIS — I10 Essential (primary) hypertension: Secondary | ICD-10-CM | POA: Diagnosis not present

## 2022-03-08 DIAGNOSIS — L89612 Pressure ulcer of right heel, stage 2: Secondary | ICD-10-CM | POA: Diagnosis not present

## 2022-03-09 ENCOUNTER — Telehealth: Payer: Self-pay | Admitting: Family Medicine

## 2022-03-09 NOTE — Telephone Encounter (Signed)
Received a request from pt's pharmacy from documentation on how many times pt test. I do not see anything in pt's chart. Please advise.

## 2022-03-10 DIAGNOSIS — I69351 Hemiplegia and hemiparesis following cerebral infarction affecting right dominant side: Secondary | ICD-10-CM | POA: Diagnosis not present

## 2022-03-10 DIAGNOSIS — I6529 Occlusion and stenosis of unspecified carotid artery: Secondary | ICD-10-CM | POA: Diagnosis not present

## 2022-03-10 DIAGNOSIS — E785 Hyperlipidemia, unspecified: Secondary | ICD-10-CM | POA: Diagnosis not present

## 2022-03-10 DIAGNOSIS — E119 Type 2 diabetes mellitus without complications: Secondary | ICD-10-CM | POA: Diagnosis not present

## 2022-03-10 DIAGNOSIS — L89612 Pressure ulcer of right heel, stage 2: Secondary | ICD-10-CM | POA: Diagnosis not present

## 2022-03-10 DIAGNOSIS — I10 Essential (primary) hypertension: Secondary | ICD-10-CM | POA: Diagnosis not present

## 2022-03-12 DIAGNOSIS — I69351 Hemiplegia and hemiparesis following cerebral infarction affecting right dominant side: Secondary | ICD-10-CM | POA: Diagnosis not present

## 2022-03-12 DIAGNOSIS — Z4800 Encounter for change or removal of nonsurgical wound dressing: Secondary | ICD-10-CM | POA: Diagnosis not present

## 2022-03-12 DIAGNOSIS — F17210 Nicotine dependence, cigarettes, uncomplicated: Secondary | ICD-10-CM | POA: Diagnosis not present

## 2022-03-12 DIAGNOSIS — I6529 Occlusion and stenosis of unspecified carotid artery: Secondary | ICD-10-CM | POA: Diagnosis not present

## 2022-03-12 DIAGNOSIS — E119 Type 2 diabetes mellitus without complications: Secondary | ICD-10-CM | POA: Diagnosis not present

## 2022-03-12 DIAGNOSIS — L89612 Pressure ulcer of right heel, stage 2: Secondary | ICD-10-CM | POA: Diagnosis not present

## 2022-03-12 DIAGNOSIS — K5790 Diverticulosis of intestine, part unspecified, without perforation or abscess without bleeding: Secondary | ICD-10-CM | POA: Diagnosis not present

## 2022-03-12 DIAGNOSIS — E785 Hyperlipidemia, unspecified: Secondary | ICD-10-CM | POA: Diagnosis not present

## 2022-03-12 DIAGNOSIS — Z9181 History of falling: Secondary | ICD-10-CM | POA: Diagnosis not present

## 2022-03-12 DIAGNOSIS — I10 Essential (primary) hypertension: Secondary | ICD-10-CM | POA: Diagnosis not present

## 2022-03-12 DIAGNOSIS — Z7901 Long term (current) use of anticoagulants: Secondary | ICD-10-CM | POA: Diagnosis not present

## 2022-03-12 DIAGNOSIS — Z993 Dependence on wheelchair: Secondary | ICD-10-CM | POA: Diagnosis not present

## 2022-03-13 ENCOUNTER — Encounter: Payer: Self-pay | Admitting: Internal Medicine

## 2022-03-13 ENCOUNTER — Other Ambulatory Visit: Payer: Self-pay | Admitting: Family Medicine

## 2022-03-13 NOTE — Telephone Encounter (Signed)
Refill request last apt 01/27/22 next apt 03/24/22.

## 2022-03-15 ENCOUNTER — Other Ambulatory Visit: Payer: Self-pay

## 2022-03-15 DIAGNOSIS — E118 Type 2 diabetes mellitus with unspecified complications: Secondary | ICD-10-CM

## 2022-03-15 MED ORDER — ACCU-CHEK SOFTCLIX LANCETS MISC
12 refills | Status: DC
Start: 2022-03-15 — End: 2022-04-12

## 2022-03-15 MED ORDER — ONETOUCH ULTRA VI STRP: ORAL_STRIP | 12 refills | Status: DC

## 2022-03-16 ENCOUNTER — Telehealth: Payer: Self-pay | Admitting: Family Medicine

## 2022-03-16 DIAGNOSIS — I6529 Occlusion and stenosis of unspecified carotid artery: Secondary | ICD-10-CM | POA: Diagnosis not present

## 2022-03-16 DIAGNOSIS — E119 Type 2 diabetes mellitus without complications: Secondary | ICD-10-CM | POA: Diagnosis not present

## 2022-03-16 DIAGNOSIS — I10 Essential (primary) hypertension: Secondary | ICD-10-CM | POA: Diagnosis not present

## 2022-03-16 DIAGNOSIS — I69351 Hemiplegia and hemiparesis following cerebral infarction affecting right dominant side: Secondary | ICD-10-CM | POA: Diagnosis not present

## 2022-03-16 DIAGNOSIS — L89612 Pressure ulcer of right heel, stage 2: Secondary | ICD-10-CM | POA: Diagnosis not present

## 2022-03-16 DIAGNOSIS — E785 Hyperlipidemia, unspecified: Secondary | ICD-10-CM | POA: Diagnosis not present

## 2022-03-16 NOTE — Telephone Encounter (Signed)
Sarahh from [facility] called and stated Richard Kent and Richard Kent have been congested and sick for the past week. They are both coughing and nausea but no vomiting. They both come in next Friday so she wasn't sure if they needed to be seen before then due to their sickness.

## 2022-03-22 ENCOUNTER — Ambulatory Visit: Payer: Medicare Other | Admitting: Family Medicine

## 2022-03-22 DIAGNOSIS — I10 Essential (primary) hypertension: Secondary | ICD-10-CM | POA: Diagnosis not present

## 2022-03-22 DIAGNOSIS — E119 Type 2 diabetes mellitus without complications: Secondary | ICD-10-CM | POA: Diagnosis not present

## 2022-03-22 DIAGNOSIS — E785 Hyperlipidemia, unspecified: Secondary | ICD-10-CM | POA: Diagnosis not present

## 2022-03-22 DIAGNOSIS — L89612 Pressure ulcer of right heel, stage 2: Secondary | ICD-10-CM | POA: Diagnosis not present

## 2022-03-22 DIAGNOSIS — I6529 Occlusion and stenosis of unspecified carotid artery: Secondary | ICD-10-CM | POA: Diagnosis not present

## 2022-03-22 DIAGNOSIS — I69351 Hemiplegia and hemiparesis following cerebral infarction affecting right dominant side: Secondary | ICD-10-CM | POA: Diagnosis not present

## 2022-03-24 ENCOUNTER — Ambulatory Visit: Payer: Medicare Other | Admitting: Podiatry

## 2022-03-24 ENCOUNTER — Ambulatory Visit: Payer: Medicare Other | Admitting: Family Medicine

## 2022-03-24 DIAGNOSIS — L89612 Pressure ulcer of right heel, stage 2: Secondary | ICD-10-CM | POA: Diagnosis not present

## 2022-03-24 DIAGNOSIS — I69351 Hemiplegia and hemiparesis following cerebral infarction affecting right dominant side: Secondary | ICD-10-CM | POA: Diagnosis not present

## 2022-03-24 DIAGNOSIS — E785 Hyperlipidemia, unspecified: Secondary | ICD-10-CM | POA: Diagnosis not present

## 2022-03-24 DIAGNOSIS — I10 Essential (primary) hypertension: Secondary | ICD-10-CM | POA: Diagnosis not present

## 2022-03-24 DIAGNOSIS — I6529 Occlusion and stenosis of unspecified carotid artery: Secondary | ICD-10-CM | POA: Diagnosis not present

## 2022-03-24 DIAGNOSIS — E119 Type 2 diabetes mellitus without complications: Secondary | ICD-10-CM | POA: Diagnosis not present

## 2022-03-27 DIAGNOSIS — I10 Essential (primary) hypertension: Secondary | ICD-10-CM | POA: Diagnosis not present

## 2022-03-27 DIAGNOSIS — E119 Type 2 diabetes mellitus without complications: Secondary | ICD-10-CM | POA: Diagnosis not present

## 2022-03-27 DIAGNOSIS — I6529 Occlusion and stenosis of unspecified carotid artery: Secondary | ICD-10-CM | POA: Diagnosis not present

## 2022-03-27 DIAGNOSIS — L89612 Pressure ulcer of right heel, stage 2: Secondary | ICD-10-CM | POA: Diagnosis not present

## 2022-03-27 DIAGNOSIS — E785 Hyperlipidemia, unspecified: Secondary | ICD-10-CM | POA: Diagnosis not present

## 2022-03-27 DIAGNOSIS — I69351 Hemiplegia and hemiparesis following cerebral infarction affecting right dominant side: Secondary | ICD-10-CM | POA: Diagnosis not present

## 2022-03-28 DIAGNOSIS — I6529 Occlusion and stenosis of unspecified carotid artery: Secondary | ICD-10-CM | POA: Diagnosis not present

## 2022-03-28 DIAGNOSIS — I69351 Hemiplegia and hemiparesis following cerebral infarction affecting right dominant side: Secondary | ICD-10-CM | POA: Diagnosis not present

## 2022-03-28 DIAGNOSIS — E785 Hyperlipidemia, unspecified: Secondary | ICD-10-CM | POA: Diagnosis not present

## 2022-03-28 DIAGNOSIS — L89612 Pressure ulcer of right heel, stage 2: Secondary | ICD-10-CM | POA: Diagnosis not present

## 2022-03-28 DIAGNOSIS — E119 Type 2 diabetes mellitus without complications: Secondary | ICD-10-CM | POA: Diagnosis not present

## 2022-03-28 DIAGNOSIS — I10 Essential (primary) hypertension: Secondary | ICD-10-CM | POA: Diagnosis not present

## 2022-03-29 DIAGNOSIS — I10 Essential (primary) hypertension: Secondary | ICD-10-CM | POA: Diagnosis not present

## 2022-03-29 DIAGNOSIS — I69351 Hemiplegia and hemiparesis following cerebral infarction affecting right dominant side: Secondary | ICD-10-CM | POA: Diagnosis not present

## 2022-03-29 DIAGNOSIS — I6529 Occlusion and stenosis of unspecified carotid artery: Secondary | ICD-10-CM | POA: Diagnosis not present

## 2022-03-29 DIAGNOSIS — E785 Hyperlipidemia, unspecified: Secondary | ICD-10-CM | POA: Diagnosis not present

## 2022-03-29 DIAGNOSIS — E119 Type 2 diabetes mellitus without complications: Secondary | ICD-10-CM | POA: Diagnosis not present

## 2022-03-29 DIAGNOSIS — L89612 Pressure ulcer of right heel, stage 2: Secondary | ICD-10-CM | POA: Diagnosis not present

## 2022-03-30 ENCOUNTER — Telehealth: Payer: Self-pay | Admitting: Family Medicine

## 2022-03-30 ENCOUNTER — Encounter: Payer: Self-pay | Admitting: Family Medicine

## 2022-03-30 ENCOUNTER — Telehealth: Payer: Self-pay | Admitting: *Deleted

## 2022-03-30 DIAGNOSIS — E119 Type 2 diabetes mellitus without complications: Secondary | ICD-10-CM | POA: Diagnosis not present

## 2022-03-30 DIAGNOSIS — I10 Essential (primary) hypertension: Secondary | ICD-10-CM | POA: Diagnosis not present

## 2022-03-30 DIAGNOSIS — I6529 Occlusion and stenosis of unspecified carotid artery: Secondary | ICD-10-CM | POA: Diagnosis not present

## 2022-03-30 DIAGNOSIS — I69351 Hemiplegia and hemiparesis following cerebral infarction affecting right dominant side: Secondary | ICD-10-CM | POA: Diagnosis not present

## 2022-03-30 DIAGNOSIS — E785 Hyperlipidemia, unspecified: Secondary | ICD-10-CM | POA: Diagnosis not present

## 2022-03-30 DIAGNOSIS — L89612 Pressure ulcer of right heel, stage 2: Secondary | ICD-10-CM | POA: Diagnosis not present

## 2022-03-30 NOTE — Telephone Encounter (Signed)
Richard Kent from [facility] just wanted me to let you know. [name] has had a cough for about 2 weeks that wont go away. She doesn't think he needs an appointment at this time but she wanted to make his pcp aware of what is going on with him.  I did let them know you were out of office for the next week.

## 2022-03-30 NOTE — Telephone Encounter (Signed)
Called and left message for Judson Roch w/ [facility] that it is ok to switch patient to [place].

## 2022-03-30 NOTE — Telephone Encounter (Addendum)
Sarah with [phone]) is calling for new orders if needed before upcoming appointment on tomorrow. She is also wanting to make the physician aware that a new area has developed on the left heel,skin got dry and cracked opened. She is wanting to ask if gentamicin prescribed by PCP can be changed to medi honey,covering w/ non stick bandage. Please advise.

## 2022-03-31 ENCOUNTER — Ambulatory Visit (INDEPENDENT_AMBULATORY_CARE_PROVIDER_SITE_OTHER): Payer: Medicare Other | Admitting: Podiatry

## 2022-03-31 DIAGNOSIS — L97422 Non-pressure chronic ulcer of left heel and midfoot with fat layer exposed: Secondary | ICD-10-CM | POA: Diagnosis not present

## 2022-03-31 DIAGNOSIS — E118 Type 2 diabetes mellitus with unspecified complications: Secondary | ICD-10-CM

## 2022-03-31 NOTE — Progress Notes (Signed)
Subjective:  Patient ID: Richard Kent, male    DOB: 11/30/1928,  MRN: [id number]  Chief Complaint  Patient presents with   Foot Ulcer    88 y.o. male presents with the above complaint.  Patient presents with a follow-up of right heel ulceration.  The right he has had completely healed.  Now is most of the left side.  He is being managed at the nursing center.  They would like to know if they can do Medihoney.   Review of Systems: Negative except as noted in the HPI. Denies N/V/F/Ch.  Past Medical History:  Diagnosis Date   Carotid stenosis    CVA (cerebral infarction) 2011   Diverticulosis    Hyperlipidemia    Hypertension    Kidney stone    Smoker    Stroke ([place]) 11/17/09    Current Outpatient Medications:    Accu-Chek Softclix Lancets lancets, Check blood sugar once daily, Disp: 200 each, Rfl: 12   acetaminophen (TYLENOL) 500 MG tablet, Take 500 mg by mouth every 6 (six) hours as needed. (Patient not taking: Reported on 02/06/2022), Disp: , Rfl:    amLODipine (NORVASC) 2.5 MG tablet, Take 1 tablet (2.5 mg total) by mouth daily., Disp: 90 tablet, Rfl: 3   apixaban (ELIQUIS) 5 MG TABS tablet, Take 1 tablet (5 mg total) by mouth 2 (two) times daily., Disp: 180 tablet, Rfl: 3   Ascorbic Acid (VITAMIN C PO), Take by mouth., Disp: , Rfl:    Blood Glucose Monitoring Suppl (ONE TOUCH ULTRA 2) w/Device KIT, 1 each by Does not apply route daily., Disp: 1 kit, Rfl: 0   cephALEXin (KEFLEX) 500 MG capsule, Take 1 capsule (500 mg total) by mouth 3 (three) times daily. (Patient not taking: Reported on 01/18/2022), Disp: 30 capsule, Rfl: 0   furosemide (LASIX) 20 MG tablet, Take 1/2 (one-half) tablet by mouth once daily, Disp: 30 tablet, Rfl: 1   gabapentin (NEURONTIN) 100 MG capsule, TAKE 1 CAPSULE BY MOUTH AT BEDTIME. WILL LIKELY INCREASE TO 2 DAILY AFTER A WEEK OR TWO, Disp: 60 capsule, Rfl: 0   glucose blood (ONETOUCH ULTRA) test strip, Check blood sugar once daily, Disp: 100 each, Rfl: 12    metoprolol succinate (TOPROL XL) 25 MG 24 hr tablet, Take 0.5 tablets (12.5 mg total) by mouth daily. Hold if systolic blood pressure (top blood pressure number) less than 100 mmHg or heart rate less than 60 bpm (pulse)., Disp: 45 tablet, Rfl: 3   Multiple Vitamin (MULTIVITAMIN WITH MINERALS) TABS, Take 1 tablet by mouth every morning. Reported on 09/06/2015, Disp: , Rfl:    mupirocin ointment (BACTROBAN) 2 %, Apply 1 Application topically 2 (two) times daily., Disp: 22 g, Rfl: 2   simvastatin (ZOCOR) 20 MG tablet, Take 1 tablet (20 mg total) by mouth every evening., Disp: 90 tablet, Rfl: 3   VITAMIN D PO, Take by mouth., Disp: , Rfl:   Social History   Tobacco Use  Smoking Status Former   Packs/day: 2.00   Years: 20.00   Total pack years: 40.00   Types: Cigarettes   Quit date: 01/13/1972   Years since quitting: 50.2  Smokeless Tobacco Never    No Known Allergies Objective:  There were no vitals filed for this visit. There is no height or weight on file to calculate BMI. Constitutional Well developed. Well nourished.  Vascular Dorsalis pedis pulses palpable bilaterally. Posterior tibial pulses palpable bilaterally. Capillary refill normal to all digits.  No cyanosis or clubbing noted. Pedal hair  growth normal.  Neurologic Normal speech. Oriented to person, place, and time. Epicritic sensation to light touch grossly present bilaterally.  Dermatologic Left heel decubitus ulceration with fat layer exposed no redness noted granular wound bed noted no fibrotic tissue.  No purulent drainage noted.  Orthopedic: Normal joint ROM without pain or crepitus bilaterally. No visible deformities. No bony tenderness.   Radiographs: None Assessment:   No diagnosis found.  Plan:  Patient was evaluated and treated and all questions answered.  Right heel decubitus ulceration with exposed -Clinically healed.  No further signs of ulceration noted no breakdown noted.  At this time I discussed  offloading and shoe gear modification with the patient in extensive detail.  He states understanding  Left heel superficial breakdown -I explained the patient the etiology of heel ulceration and various treatment options were discussed. -The wound is being managed at the nursing center.  I encouraged doing Medihoney to the wound and see if there is an improvement with offloading as well.  They both state understanding. -Minimal debridement was carried out  No follow-ups on file.

## 2022-04-05 DIAGNOSIS — I6529 Occlusion and stenosis of unspecified carotid artery: Secondary | ICD-10-CM | POA: Diagnosis not present

## 2022-04-05 DIAGNOSIS — E785 Hyperlipidemia, unspecified: Secondary | ICD-10-CM | POA: Diagnosis not present

## 2022-04-05 DIAGNOSIS — E119 Type 2 diabetes mellitus without complications: Secondary | ICD-10-CM | POA: Diagnosis not present

## 2022-04-05 DIAGNOSIS — L89612 Pressure ulcer of right heel, stage 2: Secondary | ICD-10-CM | POA: Diagnosis not present

## 2022-04-05 DIAGNOSIS — I10 Essential (primary) hypertension: Secondary | ICD-10-CM | POA: Diagnosis not present

## 2022-04-05 DIAGNOSIS — I69351 Hemiplegia and hemiparesis following cerebral infarction affecting right dominant side: Secondary | ICD-10-CM | POA: Diagnosis not present

## 2022-04-06 DIAGNOSIS — I6529 Occlusion and stenosis of unspecified carotid artery: Secondary | ICD-10-CM | POA: Diagnosis not present

## 2022-04-06 DIAGNOSIS — I10 Essential (primary) hypertension: Secondary | ICD-10-CM | POA: Diagnosis not present

## 2022-04-06 DIAGNOSIS — E119 Type 2 diabetes mellitus without complications: Secondary | ICD-10-CM | POA: Diagnosis not present

## 2022-04-06 DIAGNOSIS — E785 Hyperlipidemia, unspecified: Secondary | ICD-10-CM | POA: Diagnosis not present

## 2022-04-06 DIAGNOSIS — L89612 Pressure ulcer of right heel, stage 2: Secondary | ICD-10-CM | POA: Diagnosis not present

## 2022-04-06 DIAGNOSIS — I69351 Hemiplegia and hemiparesis following cerebral infarction affecting right dominant side: Secondary | ICD-10-CM | POA: Diagnosis not present

## 2022-04-07 DIAGNOSIS — E119 Type 2 diabetes mellitus without complications: Secondary | ICD-10-CM | POA: Diagnosis not present

## 2022-04-07 DIAGNOSIS — I6529 Occlusion and stenosis of unspecified carotid artery: Secondary | ICD-10-CM | POA: Diagnosis not present

## 2022-04-07 DIAGNOSIS — L89612 Pressure ulcer of right heel, stage 2: Secondary | ICD-10-CM | POA: Diagnosis not present

## 2022-04-07 DIAGNOSIS — I10 Essential (primary) hypertension: Secondary | ICD-10-CM | POA: Diagnosis not present

## 2022-04-07 DIAGNOSIS — I69351 Hemiplegia and hemiparesis following cerebral infarction affecting right dominant side: Secondary | ICD-10-CM | POA: Diagnosis not present

## 2022-04-07 DIAGNOSIS — E785 Hyperlipidemia, unspecified: Secondary | ICD-10-CM | POA: Diagnosis not present

## 2022-04-11 ENCOUNTER — Telehealth: Payer: Self-pay

## 2022-04-11 ENCOUNTER — Other Ambulatory Visit: Payer: Self-pay | Admitting: Family Medicine

## 2022-04-11 DIAGNOSIS — Z9181 History of falling: Secondary | ICD-10-CM | POA: Diagnosis not present

## 2022-04-11 DIAGNOSIS — Z993 Dependence on wheelchair: Secondary | ICD-10-CM | POA: Diagnosis not present

## 2022-04-11 DIAGNOSIS — E119 Type 2 diabetes mellitus without complications: Secondary | ICD-10-CM | POA: Diagnosis not present

## 2022-04-11 DIAGNOSIS — Z7901 Long term (current) use of anticoagulants: Secondary | ICD-10-CM | POA: Diagnosis not present

## 2022-04-11 DIAGNOSIS — F17210 Nicotine dependence, cigarettes, uncomplicated: Secondary | ICD-10-CM | POA: Diagnosis not present

## 2022-04-11 DIAGNOSIS — I69351 Hemiplegia and hemiparesis following cerebral infarction affecting right dominant side: Secondary | ICD-10-CM | POA: Diagnosis not present

## 2022-04-11 DIAGNOSIS — Z4800 Encounter for change or removal of nonsurgical wound dressing: Secondary | ICD-10-CM | POA: Diagnosis not present

## 2022-04-11 DIAGNOSIS — K5790 Diverticulosis of intestine, part unspecified, without perforation or abscess without bleeding: Secondary | ICD-10-CM | POA: Diagnosis not present

## 2022-04-11 DIAGNOSIS — I10 Essential (primary) hypertension: Secondary | ICD-10-CM | POA: Diagnosis not present

## 2022-04-11 DIAGNOSIS — E785 Hyperlipidemia, unspecified: Secondary | ICD-10-CM | POA: Diagnosis not present

## 2022-04-11 DIAGNOSIS — L89612 Pressure ulcer of right heel, stage 2: Secondary | ICD-10-CM | POA: Diagnosis not present

## 2022-04-11 DIAGNOSIS — I6529 Occlusion and stenosis of unspecified carotid artery: Secondary | ICD-10-CM | POA: Diagnosis not present

## 2022-04-11 MED ORDER — ACETAMINOPHEN-CODEINE 300-30 MG PO TABS: 1.0000 | ORAL_TABLET | ORAL | 0 refills | Status: DC | PRN

## 2022-04-11 NOTE — Telephone Encounter (Signed)
Patient scheduled virtual for Monday at 1:30 was not sure if you wanted me to decline or fill?

## 2022-04-11 NOTE — Telephone Encounter (Signed)
Is this okay to refill? 

## 2022-04-11 NOTE — Telephone Encounter (Signed)
No additional information need from me

## 2022-04-12 ENCOUNTER — Other Ambulatory Visit: Payer: Self-pay | Admitting: *Deleted

## 2022-04-12 MED ORDER — ONETOUCH DELICA LANCETS 33G MISC: 1.0000 | Freq: Every day | 11 refills | Status: DC

## 2022-04-12 MED ORDER — ACCU-CHEK SOFTCLIX LANCETS MISC: 11 refills | Status: DC

## 2022-04-12 NOTE — Telephone Encounter (Signed)
Monday @ 1:30

## 2022-04-17 ENCOUNTER — Telehealth (INDEPENDENT_AMBULATORY_CARE_PROVIDER_SITE_OTHER): Payer: Medicare Other | Admitting: Family Medicine

## 2022-04-17 ENCOUNTER — Encounter: Payer: Self-pay | Admitting: Family Medicine

## 2022-04-17 VITALS — Ht 72.0 in | Wt 175.0 lb

## 2022-04-17 DIAGNOSIS — I6523 Occlusion and stenosis of bilateral carotid arteries: Secondary | ICD-10-CM

## 2022-04-17 DIAGNOSIS — L97519 Non-pressure chronic ulcer of other part of right foot with unspecified severity: Secondary | ICD-10-CM | POA: Diagnosis not present

## 2022-04-17 NOTE — Progress Notes (Signed)
   Subjective:    Patient ID: Richard Kent, male    DOB: 1928/12/21, 88 y.o.   MRN: [id number]  HPI Documentation for virtual audio and video telecommunications through [facility] encounter: The patient was located at home. 2 patient identifiers used.  The provider was located in the office. The patient did consent to this visit and is aware of possible charges through their insurance for this visit. The other persons participating in this telemedicine service were none. Time spent on call was 5 minutes and in review of previous records >15 minutes total for counseling and coordination of care. This virtual service is not related to other E/M service within previous 7 days.  He continues to have foot pain.  He was given Neurontin to help with this pain and apparently this has worked fairly well.  He continues to be followed by Dr. Posey Pronto for the foot pain.  He apparently gave a codeine combination and so the wife decided to stop taking the Neurontin.  Review of Systems     Objective:   Physical Exam Alert and in no distress otherwise not examined       Assessment & Plan:  Ulcer of right foot, unspecified ulcer stage ([place]) Since he continues have difficulty with the pain and the Neurontin seems to work better, I will have her increase the dosing to 300 mg at bedtime and give it a week.  She will call me at that point.  I discussed the fact that I might increase this to twice daily since his pain is really all day long.  I said that they can still use the codeine preparation but judiciously because it can also cause sedation.

## 2022-04-18 DIAGNOSIS — I10 Essential (primary) hypertension: Secondary | ICD-10-CM | POA: Diagnosis not present

## 2022-04-18 DIAGNOSIS — E119 Type 2 diabetes mellitus without complications: Secondary | ICD-10-CM | POA: Diagnosis not present

## 2022-04-18 DIAGNOSIS — I6529 Occlusion and stenosis of unspecified carotid artery: Secondary | ICD-10-CM | POA: Diagnosis not present

## 2022-04-18 DIAGNOSIS — I69351 Hemiplegia and hemiparesis following cerebral infarction affecting right dominant side: Secondary | ICD-10-CM | POA: Diagnosis not present

## 2022-04-18 DIAGNOSIS — L89612 Pressure ulcer of right heel, stage 2: Secondary | ICD-10-CM | POA: Diagnosis not present

## 2022-04-18 DIAGNOSIS — E785 Hyperlipidemia, unspecified: Secondary | ICD-10-CM | POA: Diagnosis not present

## 2022-04-20 DIAGNOSIS — I10 Essential (primary) hypertension: Secondary | ICD-10-CM | POA: Diagnosis not present

## 2022-04-20 DIAGNOSIS — I69351 Hemiplegia and hemiparesis following cerebral infarction affecting right dominant side: Secondary | ICD-10-CM | POA: Diagnosis not present

## 2022-04-20 DIAGNOSIS — E785 Hyperlipidemia, unspecified: Secondary | ICD-10-CM | POA: Diagnosis not present

## 2022-04-20 DIAGNOSIS — E119 Type 2 diabetes mellitus without complications: Secondary | ICD-10-CM | POA: Diagnosis not present

## 2022-04-20 DIAGNOSIS — L89612 Pressure ulcer of right heel, stage 2: Secondary | ICD-10-CM | POA: Diagnosis not present

## 2022-04-20 DIAGNOSIS — I6529 Occlusion and stenosis of unspecified carotid artery: Secondary | ICD-10-CM | POA: Diagnosis not present

## 2022-04-21 DIAGNOSIS — I69351 Hemiplegia and hemiparesis following cerebral infarction affecting right dominant side: Secondary | ICD-10-CM | POA: Diagnosis not present

## 2022-04-21 DIAGNOSIS — I10 Essential (primary) hypertension: Secondary | ICD-10-CM | POA: Diagnosis not present

## 2022-04-21 DIAGNOSIS — I6529 Occlusion and stenosis of unspecified carotid artery: Secondary | ICD-10-CM | POA: Diagnosis not present

## 2022-04-21 DIAGNOSIS — E119 Type 2 diabetes mellitus without complications: Secondary | ICD-10-CM | POA: Diagnosis not present

## 2022-04-21 DIAGNOSIS — L89612 Pressure ulcer of right heel, stage 2: Secondary | ICD-10-CM | POA: Diagnosis not present

## 2022-04-21 DIAGNOSIS — E785 Hyperlipidemia, unspecified: Secondary | ICD-10-CM | POA: Diagnosis not present

## 2022-04-24 ENCOUNTER — Telehealth: Payer: Self-pay | Admitting: Family Medicine

## 2022-04-24 NOTE — Telephone Encounter (Signed)
Pt's wife called and states that increasing the gabapentin to 300 mg has made a slight improvement in patient's pain. Pt's wife [name] would like to give patient 300 mg twice a day as discussed previously. She wanted to get the ok from [facility] first. Please advise [name] at [phone].

## 2022-04-25 DIAGNOSIS — E785 Hyperlipidemia, unspecified: Secondary | ICD-10-CM | POA: Diagnosis not present

## 2022-04-25 DIAGNOSIS — I69351 Hemiplegia and hemiparesis following cerebral infarction affecting right dominant side: Secondary | ICD-10-CM | POA: Diagnosis not present

## 2022-04-25 DIAGNOSIS — L89612 Pressure ulcer of right heel, stage 2: Secondary | ICD-10-CM | POA: Diagnosis not present

## 2022-04-25 DIAGNOSIS — I6529 Occlusion and stenosis of unspecified carotid artery: Secondary | ICD-10-CM | POA: Diagnosis not present

## 2022-04-25 DIAGNOSIS — I10 Essential (primary) hypertension: Secondary | ICD-10-CM | POA: Diagnosis not present

## 2022-04-25 DIAGNOSIS — E119 Type 2 diabetes mellitus without complications: Secondary | ICD-10-CM | POA: Diagnosis not present

## 2022-04-25 NOTE — Telephone Encounter (Signed)
Richard Kent was advised

## 2022-04-27 DIAGNOSIS — E785 Hyperlipidemia, unspecified: Secondary | ICD-10-CM | POA: Diagnosis not present

## 2022-04-27 DIAGNOSIS — I6529 Occlusion and stenosis of unspecified carotid artery: Secondary | ICD-10-CM | POA: Diagnosis not present

## 2022-04-27 DIAGNOSIS — I10 Essential (primary) hypertension: Secondary | ICD-10-CM | POA: Diagnosis not present

## 2022-04-27 DIAGNOSIS — L89612 Pressure ulcer of right heel, stage 2: Secondary | ICD-10-CM | POA: Diagnosis not present

## 2022-04-27 DIAGNOSIS — E119 Type 2 diabetes mellitus without complications: Secondary | ICD-10-CM | POA: Diagnosis not present

## 2022-04-27 DIAGNOSIS — I69351 Hemiplegia and hemiparesis following cerebral infarction affecting right dominant side: Secondary | ICD-10-CM | POA: Diagnosis not present

## 2022-05-02 ENCOUNTER — Other Ambulatory Visit: Payer: Self-pay | Admitting: Family Medicine

## 2022-05-02 DIAGNOSIS — I69351 Hemiplegia and hemiparesis following cerebral infarction affecting right dominant side: Secondary | ICD-10-CM | POA: Diagnosis not present

## 2022-05-02 DIAGNOSIS — E119 Type 2 diabetes mellitus without complications: Secondary | ICD-10-CM | POA: Diagnosis not present

## 2022-05-02 DIAGNOSIS — L89612 Pressure ulcer of right heel, stage 2: Secondary | ICD-10-CM | POA: Diagnosis not present

## 2022-05-02 DIAGNOSIS — I6529 Occlusion and stenosis of unspecified carotid artery: Secondary | ICD-10-CM | POA: Diagnosis not present

## 2022-05-02 DIAGNOSIS — I10 Essential (primary) hypertension: Secondary | ICD-10-CM | POA: Diagnosis not present

## 2022-05-02 DIAGNOSIS — E785 Hyperlipidemia, unspecified: Secondary | ICD-10-CM | POA: Diagnosis not present

## 2022-05-02 NOTE — Telephone Encounter (Signed)
Richard Kent called in and expressed he is out of gabopentin '300mg'$  and really needs it filled today. Also stated he has stopped the tylenol with codene. Prefers Product/process development scientist [address] [place] - [address]

## 2022-05-02 NOTE — Telephone Encounter (Signed)
He has been taking Neurontin 300 mg twice per day and his wife thinks that he might do better with a little higher dosing.  I will call in the 400 mg twice daily and see what that we will do.  She will call me in a month.

## 2022-05-04 ENCOUNTER — Telehealth: Payer: Self-pay | Admitting: Family Medicine

## 2022-05-04 DIAGNOSIS — I69351 Hemiplegia and hemiparesis following cerebral infarction affecting right dominant side: Secondary | ICD-10-CM | POA: Diagnosis not present

## 2022-05-04 DIAGNOSIS — E785 Hyperlipidemia, unspecified: Secondary | ICD-10-CM | POA: Diagnosis not present

## 2022-05-04 DIAGNOSIS — I6529 Occlusion and stenosis of unspecified carotid artery: Secondary | ICD-10-CM | POA: Diagnosis not present

## 2022-05-04 DIAGNOSIS — L89612 Pressure ulcer of right heel, stage 2: Secondary | ICD-10-CM | POA: Diagnosis not present

## 2022-05-04 DIAGNOSIS — I10 Essential (primary) hypertension: Secondary | ICD-10-CM | POA: Diagnosis not present

## 2022-05-04 DIAGNOSIS — E119 Type 2 diabetes mellitus without complications: Secondary | ICD-10-CM | POA: Diagnosis not present

## 2022-05-04 NOTE — Telephone Encounter (Signed)
Clarise Cruz with [facility] needs you to know patient Lower extremities markedly increase in swelling and pitting. Left foot pitting 1+ Rt foot Pitting 2+  Wound between left 2nd and 3rd toe.  Stage #2 Can she get order to Clean wound and pat dry?  Apply small or thin layer of antibiotic ointment.  Then place non adhesive foam between toes.  Should we secure toes with Curlex or Coban?  Can pt increase Lasix to 20 mg for a little while to get the swelling down?  Pt now taking 10 mg lasix.  Please advise Clarise Cruz [phone]

## 2022-05-07 DIAGNOSIS — L89612 Pressure ulcer of right heel, stage 2: Secondary | ICD-10-CM | POA: Diagnosis not present

## 2022-05-07 DIAGNOSIS — I10 Essential (primary) hypertension: Secondary | ICD-10-CM | POA: Diagnosis not present

## 2022-05-07 DIAGNOSIS — I6529 Occlusion and stenosis of unspecified carotid artery: Secondary | ICD-10-CM | POA: Diagnosis not present

## 2022-05-07 DIAGNOSIS — E785 Hyperlipidemia, unspecified: Secondary | ICD-10-CM | POA: Diagnosis not present

## 2022-05-07 DIAGNOSIS — I69351 Hemiplegia and hemiparesis following cerebral infarction affecting right dominant side: Secondary | ICD-10-CM | POA: Diagnosis not present

## 2022-05-07 DIAGNOSIS — E119 Type 2 diabetes mellitus without complications: Secondary | ICD-10-CM | POA: Diagnosis not present

## 2022-05-08 NOTE — Telephone Encounter (Signed)
Advised nurse of Dr Lanice Shirts order.  She will increase Lasix for 1 week and let us know.

## 2022-05-09 ENCOUNTER — Telehealth: Payer: Self-pay | Admitting: Internal Medicine

## 2022-05-09 DIAGNOSIS — I10 Essential (primary) hypertension: Secondary | ICD-10-CM | POA: Diagnosis not present

## 2022-05-09 DIAGNOSIS — L89612 Pressure ulcer of right heel, stage 2: Secondary | ICD-10-CM | POA: Diagnosis not present

## 2022-05-09 DIAGNOSIS — E119 Type 2 diabetes mellitus without complications: Secondary | ICD-10-CM | POA: Diagnosis not present

## 2022-05-09 DIAGNOSIS — I6529 Occlusion and stenosis of unspecified carotid artery: Secondary | ICD-10-CM | POA: Diagnosis not present

## 2022-05-09 DIAGNOSIS — I69351 Hemiplegia and hemiparesis following cerebral infarction affecting right dominant side: Secondary | ICD-10-CM | POA: Diagnosis not present

## 2022-05-09 DIAGNOSIS — E785 Hyperlipidemia, unspecified: Secondary | ICD-10-CM | POA: Diagnosis not present

## 2022-05-09 NOTE — Telephone Encounter (Signed)
Tillie Rung with PT called and wanted to know if you would recommend patient trying over the counter Norvelt which is a nutritional supplement to help with wound healing. Please advise [phone]

## 2022-05-09 NOTE — Telephone Encounter (Signed)
Tillie Rung was notified

## 2022-05-10 DIAGNOSIS — E119 Type 2 diabetes mellitus without complications: Secondary | ICD-10-CM | POA: Diagnosis not present

## 2022-05-10 DIAGNOSIS — I10 Essential (primary) hypertension: Secondary | ICD-10-CM | POA: Diagnosis not present

## 2022-05-10 DIAGNOSIS — E785 Hyperlipidemia, unspecified: Secondary | ICD-10-CM | POA: Diagnosis not present

## 2022-05-10 DIAGNOSIS — I69351 Hemiplegia and hemiparesis following cerebral infarction affecting right dominant side: Secondary | ICD-10-CM | POA: Diagnosis not present

## 2022-05-10 DIAGNOSIS — I6529 Occlusion and stenosis of unspecified carotid artery: Secondary | ICD-10-CM | POA: Diagnosis not present

## 2022-05-10 DIAGNOSIS — L89612 Pressure ulcer of right heel, stage 2: Secondary | ICD-10-CM | POA: Diagnosis not present

## 2022-05-11 DIAGNOSIS — I6529 Occlusion and stenosis of unspecified carotid artery: Secondary | ICD-10-CM | POA: Diagnosis not present

## 2022-05-11 DIAGNOSIS — L89612 Pressure ulcer of right heel, stage 2: Secondary | ICD-10-CM | POA: Diagnosis not present

## 2022-05-11 DIAGNOSIS — I10 Essential (primary) hypertension: Secondary | ICD-10-CM | POA: Diagnosis not present

## 2022-05-11 DIAGNOSIS — E11621 Type 2 diabetes mellitus with foot ulcer: Secondary | ICD-10-CM | POA: Diagnosis not present

## 2022-05-11 DIAGNOSIS — E785 Hyperlipidemia, unspecified: Secondary | ICD-10-CM | POA: Diagnosis not present

## 2022-05-11 DIAGNOSIS — Z993 Dependence on wheelchair: Secondary | ICD-10-CM | POA: Diagnosis not present

## 2022-05-11 DIAGNOSIS — L97521 Non-pressure chronic ulcer of other part of left foot limited to breakdown of skin: Secondary | ICD-10-CM | POA: Diagnosis not present

## 2022-05-11 DIAGNOSIS — Z4800 Encounter for change or removal of nonsurgical wound dressing: Secondary | ICD-10-CM | POA: Diagnosis not present

## 2022-05-11 DIAGNOSIS — K5790 Diverticulosis of intestine, part unspecified, without perforation or abscess without bleeding: Secondary | ICD-10-CM | POA: Diagnosis not present

## 2022-05-11 DIAGNOSIS — Z9181 History of falling: Secondary | ICD-10-CM | POA: Diagnosis not present

## 2022-05-11 DIAGNOSIS — F17210 Nicotine dependence, cigarettes, uncomplicated: Secondary | ICD-10-CM | POA: Diagnosis not present

## 2022-05-11 DIAGNOSIS — I69351 Hemiplegia and hemiparesis following cerebral infarction affecting right dominant side: Secondary | ICD-10-CM | POA: Diagnosis not present

## 2022-05-11 DIAGNOSIS — Z7901 Long term (current) use of anticoagulants: Secondary | ICD-10-CM | POA: Diagnosis not present

## 2022-05-16 DIAGNOSIS — L97521 Non-pressure chronic ulcer of other part of left foot limited to breakdown of skin: Secondary | ICD-10-CM | POA: Diagnosis not present

## 2022-05-16 DIAGNOSIS — I10 Essential (primary) hypertension: Secondary | ICD-10-CM | POA: Diagnosis not present

## 2022-05-16 DIAGNOSIS — I6529 Occlusion and stenosis of unspecified carotid artery: Secondary | ICD-10-CM | POA: Diagnosis not present

## 2022-05-16 DIAGNOSIS — L89612 Pressure ulcer of right heel, stage 2: Secondary | ICD-10-CM | POA: Diagnosis not present

## 2022-05-16 DIAGNOSIS — E11621 Type 2 diabetes mellitus with foot ulcer: Secondary | ICD-10-CM | POA: Diagnosis not present

## 2022-05-16 DIAGNOSIS — I69351 Hemiplegia and hemiparesis following cerebral infarction affecting right dominant side: Secondary | ICD-10-CM | POA: Diagnosis not present

## 2022-05-18 ENCOUNTER — Telehealth: Payer: Self-pay | Admitting: Family Medicine

## 2022-05-18 DIAGNOSIS — I6529 Occlusion and stenosis of unspecified carotid artery: Secondary | ICD-10-CM | POA: Diagnosis not present

## 2022-05-18 DIAGNOSIS — I69351 Hemiplegia and hemiparesis following cerebral infarction affecting right dominant side: Secondary | ICD-10-CM | POA: Diagnosis not present

## 2022-05-18 DIAGNOSIS — I10 Essential (primary) hypertension: Secondary | ICD-10-CM | POA: Diagnosis not present

## 2022-05-18 DIAGNOSIS — L89612 Pressure ulcer of right heel, stage 2: Secondary | ICD-10-CM | POA: Diagnosis not present

## 2022-05-18 DIAGNOSIS — L97521 Non-pressure chronic ulcer of other part of left foot limited to breakdown of skin: Secondary | ICD-10-CM | POA: Diagnosis not present

## 2022-05-18 DIAGNOSIS — E11621 Type 2 diabetes mellitus with foot ulcer: Secondary | ICD-10-CM | POA: Diagnosis not present

## 2022-05-18 NOTE — Telephone Encounter (Signed)
Richard Kent with a [employer]  [phone]  called  Pt is showing sign of infection on 3rd toe. Bright red, some yellow drainage  ? Prescribe medication  Also, previous increase in lasix really helped but since that stopped, he is "ballooning up" again  She would like to have him take 1 tablet on Mon, Wed & Friday and then 1/2 tablet on Tues, Thursdays, Sat & Sunday   If you agree with increase in lasix, please also send new rx with new directions to pharmacy  Please call

## 2022-05-19 ENCOUNTER — Telehealth: Payer: Medicare Other | Admitting: Family Medicine

## 2022-05-19 ENCOUNTER — Ambulatory Visit (INDEPENDENT_AMBULATORY_CARE_PROVIDER_SITE_OTHER): Payer: Medicare Other | Admitting: Family Medicine

## 2022-05-19 ENCOUNTER — Encounter: Payer: Self-pay | Admitting: Family Medicine

## 2022-05-19 VITALS — BP 132/60 | HR 58 | Temp 98.1°F

## 2022-05-19 DIAGNOSIS — M79604 Pain in right leg: Secondary | ICD-10-CM

## 2022-05-19 DIAGNOSIS — E11628 Type 2 diabetes mellitus with other skin complications: Secondary | ICD-10-CM | POA: Diagnosis not present

## 2022-05-19 DIAGNOSIS — Z23 Encounter for immunization: Secondary | ICD-10-CM | POA: Diagnosis not present

## 2022-05-19 DIAGNOSIS — I4891 Unspecified atrial fibrillation: Secondary | ICD-10-CM

## 2022-05-19 DIAGNOSIS — I6523 Occlusion and stenosis of bilateral carotid arteries: Secondary | ICD-10-CM

## 2022-05-19 DIAGNOSIS — L03119 Cellulitis of unspecified part of limb: Secondary | ICD-10-CM

## 2022-05-19 DIAGNOSIS — E118 Type 2 diabetes mellitus with unspecified complications: Secondary | ICD-10-CM

## 2022-05-19 LAB — POCT GLYCOSYLATED HEMOGLOBIN (HGB A1C): Hemoglobin A1C: 6.3 % — AB (ref 4.0–5.6)

## 2022-05-19 MED ORDER — TRAMADOL HCL 50 MG PO TABS: 50.0000 mg | ORAL_TABLET | Freq: Three times a day (TID) | ORAL | 1 refills | Status: DC | PRN

## 2022-05-19 MED ORDER — AMOXICILLIN-POT CLAVULANATE 875-125 MG PO TABS: 1.0000 | ORAL_TABLET | Freq: Two times a day (BID) | ORAL | 0 refills | Status: DC

## 2022-05-19 NOTE — Progress Notes (Signed)
  Subjective:    Patient ID: Richard Kent, male    DOB: [birth year], 88 y.o.   MRN: [id number]  Richard Kent is a 88 y.o. male who presents for follow-up he is being cared for by his wife who is the primary caregiver.  They do have home health agency coming in.  I did receive a message stating the nurse thought he needed an antibiotic.  Per discussion with the wife indicates that he has had pain, erythema and swelling in his left foot for the last several months.  He does have an underlying history of diabetes but is on no medication at the present time.  He is taking Neurontin for neuropathic pain which has not helped with this.  He is also taking Eliquis And does have an underlying history of A-fib. The following portions of the patient's history were reviewed and updated as appropriate: allergies, current medications, past medical history, past social history and problem list.  ROS as in subjective above.     Objective:    Physical Exam Alert and in no distress exam of the left foot shows extensive erythema, swelling and slight maceration between the toes. Hemoglobin A1c is 6.3. Lab Review    Latest Ref Rng & Units 05/19/2022    3:49 PM 01/06/2022    3:52 PM 09/19/2021    2:32 PM 02/17/2020    3:08 PM 11/11/2019    3:26 PM  Diabetic Labs  HbA1c 4.0 - 5.6 % 6.3  6.4  6.7     Chol 100 - 199 mg/dL   119     HDL >39 mg/dL   39     Calc LDL 0 - 99 mg/dL   52     Triglycerides 0 - 149 mg/dL   164     Creatinine 0.76 - 1.27 mg/dL   1.02  0.90  0.92       04/17/2022    1:01 PM 02/06/2022    8:59 AM 01/18/2022    1:51 PM 01/08/2022    5:45 AM 01/08/2022    5:30 AM  BP/Weight  Systolic BP  160 737 106 269  Diastolic BP  79 70 78 74  Wt. (Lbs) 175      BMI 23.73 kg/m2           No data to display              Assessment & Plan:    Cellulitis of foot associated with diabetes mellitus ([place]) - Plan: Ambulatory referral to Orthopedic Surgery, traMADol (ULTRAM) 50 MG tablet,  amoxicillin-clavulanate (AUGMENTIN) 875-125 MG tablet  Type II diabetes mellitus with complication ([place]) - Plan: Ambulatory referral to Orthopedic Surgery, POCT glycosylated hemoglobin (Hb A1C)  Pain of right lower extremity - Plan: Ambulatory referral to Orthopedic Surgery  Need for COVID-19 vaccine - Plan: [place] Fall 2023 Covid-19 Vaccine 65yr and older  Need for influenza vaccination - Plan: Flu vaccine HIGH DOSE PF (Fluzone High dose)  I explained to him and his wife the fact that this is potentially very dangerous with an infection in the foot of a diabetic.  I will place him on an antibiotic, given pain medication and refer him to Dr. DSharol Givenfor further evaluation and treatment.  His immunizations were updated.

## 2022-05-23 ENCOUNTER — Ambulatory Visit (INDEPENDENT_AMBULATORY_CARE_PROVIDER_SITE_OTHER): Payer: Medicare Other | Admitting: Orthopedic Surgery

## 2022-05-23 ENCOUNTER — Encounter: Payer: Self-pay | Admitting: Orthopedic Surgery

## 2022-05-23 DIAGNOSIS — I69351 Hemiplegia and hemiparesis following cerebral infarction affecting right dominant side: Secondary | ICD-10-CM | POA: Diagnosis not present

## 2022-05-23 DIAGNOSIS — I890 Lymphedema, not elsewhere classified: Secondary | ICD-10-CM | POA: Diagnosis not present

## 2022-05-23 DIAGNOSIS — I872 Venous insufficiency (chronic) (peripheral): Secondary | ICD-10-CM

## 2022-05-23 DIAGNOSIS — E11621 Type 2 diabetes mellitus with foot ulcer: Secondary | ICD-10-CM | POA: Diagnosis not present

## 2022-05-23 DIAGNOSIS — L89612 Pressure ulcer of right heel, stage 2: Secondary | ICD-10-CM | POA: Diagnosis not present

## 2022-05-23 DIAGNOSIS — I10 Essential (primary) hypertension: Secondary | ICD-10-CM | POA: Diagnosis not present

## 2022-05-23 DIAGNOSIS — I6529 Occlusion and stenosis of unspecified carotid artery: Secondary | ICD-10-CM | POA: Diagnosis not present

## 2022-05-23 DIAGNOSIS — L97521 Non-pressure chronic ulcer of other part of left foot limited to breakdown of skin: Secondary | ICD-10-CM | POA: Diagnosis not present

## 2022-05-23 NOTE — Progress Notes (Unsigned)
Office Visit Note   Patient: Richard Kent           Date of Birth: [birth year]           MRN: [id number] Visit Date: 05/23/2022              Requested by: Denita Lung, MD [address] [place],  [place] [address] PCP: Denita Lung, MD  Chief Complaint  Patient presents with   Right Foot - Wound Check   Left Foot - Wound Check      HPI: Patient is a 89 year old gentleman who was seen for initial evaluation for limb venous and lymphatic insufficiency with ulceration of both lower extremities.  Patient is status post a stroke involving the right upper and right lower extremities.  He has a history of type 2 diabetes and hypertension.  Assessment & Plan: Visit Diagnoses:  1. Lymphedema   2. Venous stasis dermatitis of both lower extremities     Plan: Both legs were wrapped.  Discussed the importance with the patient and his family regarding elevation and compression.  Will set up advanced home care to proceed with home health nursing compression wraps weekly.  Follow-Up Instructions: Return in about 4 weeks (around 06/20/2022).   Ortho Exam  Patient is alert, oriented, no adenopathy, well-dressed, normal affect, normal respiratory effort. Examination patient has massive pitting edema both lower extremities with weeping edema.  There is no cellulitis.  There is some mild ischemic ulcers over the toes.  No heel decubitus ulcers.  I cannot palpate a pulse secondary to swelling the Doppler was used and he has a strong posterior tibial pulse bilaterally.  No dorsalis pedis pulse bilaterally most likely secondary to swelling.  Imaging: No results found. No images are attached to the encounter.  Labs: Lab Results  Component Value Date   HGBA1C 6.3 (A) 05/19/2022   HGBA1C 6.4 (H) 01/06/2022   HGBA1C 6.7 (H) 09/19/2021   REPTSTATUS 01/20/2012 FINAL 01/19/2012   CULT NO GROWTH 01/19/2012   LABORGA ESCHERICHIA COLI 06/04/2010     Lab Results  Component Value Date    ALBUMIN 4.1 09/19/2021   ALBUMIN 4.2 11/11/2019   ALBUMIN 3.6 09/26/2019    No results found for: "MG" No results found for: "VD25OH"  No results found for: "PREALBUMIN"    Latest Ref Rng & Units 01/06/2022    3:52 PM 09/19/2021    2:32 PM 02/17/2020    3:05 PM  CBC EXTENDED  WBC 3.4 - 10.8 x10E3/uL 6.5  7.2  7.8   RBC 4.14 - 5.80 x10E6/uL 4.41  4.75  4.69   Hemoglobin 13.0 - 17.7 g/dL 14.3  16.0  16.3   HCT 37.5 - 51.0 % 43.0  45.1  45.1   Platelets 150 - 450 x10E3/uL 255  222  224   NEUT# 1.4 - 7.0 x10E3/uL 4.6  4.9    Lymph# 0.7 - 3.1 x10E3/uL 1.1  1.4       There is no height or weight on file to calculate BMI.  Orders:  No orders of the defined types were placed in this encounter.  No orders of the defined types were placed in this encounter.    Procedures: No procedures performed  Clinical Data: No additional findings.  ROS:  All other systems negative, except as noted in the HPI. Review of Systems  Objective: Vital Signs: There were no vitals taken for this visit.  Specialty Comments:  No specialty comments available.  PMFS History:  Patient Active Problem List   Diagnosis Date Noted   Atrial fibrillation ([place]) 05/19/2022   Type II diabetes mellitus with complication ([place]) 93/81/0175   New onset type 2 diabetes mellitus ([place]) 09/29/2021   Aortic atherosclerosis ([place]) 09/19/2021   Diarrhea 09/27/2019   Senile purpura ([place]) 01/09/2017   Carotid stenosis, bilateral 01/14/2012   Hypertension 01/12/2012   Hyperlipidemia LDL goal <100 12/17/2009   Hemiplegia, late effect of cerebrovascular disease ([place]) 12/17/2009   RBBB 10/21/2009   Past Medical History:  Diagnosis Date   Carotid stenosis    CVA (cerebral infarction) 2011   Diverticulosis    Hyperlipidemia    Hypertension    Kidney stone    Smoker    Stroke ([place]) 11/17/09    Family History  Problem Relation Age of Onset   Cancer Mother    Stroke Mother    Heart disease Father     Cervical cancer Sister    Heart attack Sister     Past Surgical History:  Procedure Laterality Date   APPENDECTOMY     HERNIA REPAIR     RIGHT   HIP ARTHROPLASTY  01/15/2012   Procedure: ARTHROPLASTY BIPOLAR HIP;  Surgeon: Rozanna Box, MD;  Location: [facility];  Service: Orthopedics;  Laterality: Right;   Social History   Occupational History   Occupation: retired    Fish farm manager: RETIRED  Tobacco Use   Smoking status: Former    Packs/day: 2.00    Years: 20.00    Total pack years: 40.00    Types: Cigarettes    Quit date: 01/13/1972    Years since quitting: 50.3   Smokeless tobacco: Never  Vaping Use   Vaping Use: Never used  Substance and Sexual Activity   Alcohol use: No    Comment: Patient quit alcohol 11/17/2009   Drug use: No   Sexual activity: Not Currently

## 2022-05-24 DIAGNOSIS — C44519 Basal cell carcinoma of skin of other part of trunk: Secondary | ICD-10-CM | POA: Diagnosis not present

## 2022-05-24 DIAGNOSIS — C4491 Basal cell carcinoma of skin, unspecified: Secondary | ICD-10-CM

## 2022-05-24 HISTORY — DX: Basal cell carcinoma of skin, unspecified: C44.91

## 2022-05-25 ENCOUNTER — Telehealth: Payer: Self-pay | Admitting: Orthopedic Surgery

## 2022-05-25 ENCOUNTER — Ambulatory Visit: Payer: Medicare Other | Admitting: Orthopedic Surgery

## 2022-05-25 DIAGNOSIS — I69351 Hemiplegia and hemiparesis following cerebral infarction affecting right dominant side: Secondary | ICD-10-CM | POA: Diagnosis not present

## 2022-05-25 DIAGNOSIS — L89612 Pressure ulcer of right heel, stage 2: Secondary | ICD-10-CM | POA: Diagnosis not present

## 2022-05-25 DIAGNOSIS — I10 Essential (primary) hypertension: Secondary | ICD-10-CM | POA: Diagnosis not present

## 2022-05-25 DIAGNOSIS — E11621 Type 2 diabetes mellitus with foot ulcer: Secondary | ICD-10-CM | POA: Diagnosis not present

## 2022-05-25 DIAGNOSIS — I6529 Occlusion and stenosis of unspecified carotid artery: Secondary | ICD-10-CM | POA: Diagnosis not present

## 2022-05-25 DIAGNOSIS — L97521 Non-pressure chronic ulcer of other part of left foot limited to breakdown of skin: Secondary | ICD-10-CM | POA: Diagnosis not present

## 2022-05-25 NOTE — Telephone Encounter (Signed)
Home health nurse need orders confirmed please  call (sarah) [phone]

## 2022-05-26 ENCOUNTER — Telehealth: Payer: Self-pay | Admitting: Internal Medicine

## 2022-05-26 DIAGNOSIS — I6529 Occlusion and stenosis of unspecified carotid artery: Secondary | ICD-10-CM | POA: Diagnosis not present

## 2022-05-26 DIAGNOSIS — L03119 Cellulitis of unspecified part of limb: Secondary | ICD-10-CM

## 2022-05-26 DIAGNOSIS — L89612 Pressure ulcer of right heel, stage 2: Secondary | ICD-10-CM | POA: Diagnosis not present

## 2022-05-26 DIAGNOSIS — E11621 Type 2 diabetes mellitus with foot ulcer: Secondary | ICD-10-CM | POA: Diagnosis not present

## 2022-05-26 DIAGNOSIS — I69351 Hemiplegia and hemiparesis following cerebral infarction affecting right dominant side: Secondary | ICD-10-CM | POA: Diagnosis not present

## 2022-05-26 DIAGNOSIS — I10 Essential (primary) hypertension: Secondary | ICD-10-CM | POA: Diagnosis not present

## 2022-05-26 DIAGNOSIS — L97521 Non-pressure chronic ulcer of other part of left foot limited to breakdown of skin: Secondary | ICD-10-CM | POA: Diagnosis not present

## 2022-05-26 MED ORDER — TRAMADOL HCL 50 MG PO TABS: 100.0000 mg | ORAL_TABLET | Freq: Three times a day (TID) | ORAL | 1 refills | Status: DC | PRN

## 2022-05-26 NOTE — Telephone Encounter (Signed)
Pt's wife called and states that he only has 5 pills of tramadol left. She has been giving him 2 pills at once as the '50mg'$  hasn't been working. She now needs a refill on this. Direction say one tablet every 8 hours so pharmacy isn't going to be able to refill as its too early.   Also they want to know if they need additional antibiotic. They have 2.5 days left and the foot is improving but still having difficultly with it. Please advise

## 2022-05-26 NOTE — Telephone Encounter (Signed)
Pt was notified. Wanted to make sure he got a tramadol rx. Sent into pharmacy. You were going to send in '100mg'$  but I do not see this sent  Pt would like a order for a wheel chair. Is this ok. The one they have is falling about and over 13 years.

## 2022-05-29 ENCOUNTER — Emergency Department (HOSPITAL_COMMUNITY): Payer: Medicare Other

## 2022-05-29 ENCOUNTER — Encounter (HOSPITAL_COMMUNITY): Payer: Self-pay

## 2022-05-29 ENCOUNTER — Inpatient Hospital Stay (HOSPITAL_COMMUNITY)
Admission: EM | Admit: 2022-05-29 | Discharge: 2022-07-04 | DRG: 871 | Disposition: A | Discharge status: Skilled Nursing Facility | Payer: Medicare Other | Attending: Internal Medicine | Admitting: Internal Medicine

## 2022-05-29 ENCOUNTER — Other Ambulatory Visit: Payer: Self-pay

## 2022-05-29 DIAGNOSIS — Z993 Dependence on wheelchair: Secondary | ICD-10-CM

## 2022-05-29 DIAGNOSIS — E8809 Other disorders of plasma-protein metabolism, not elsewhere classified: Secondary | ICD-10-CM | POA: Diagnosis not present

## 2022-05-29 DIAGNOSIS — E118 Type 2 diabetes mellitus with unspecified complications: Secondary | ICD-10-CM | POA: Diagnosis present

## 2022-05-29 DIAGNOSIS — J9811 Atelectasis: Secondary | ICD-10-CM | POA: Diagnosis present

## 2022-05-29 DIAGNOSIS — I480 Paroxysmal atrial fibrillation: Secondary | ICD-10-CM | POA: Diagnosis present

## 2022-05-29 DIAGNOSIS — I10 Essential (primary) hypertension: Secondary | ICD-10-CM | POA: Diagnosis not present

## 2022-05-29 DIAGNOSIS — Z7401 Bed confinement status: Secondary | ICD-10-CM | POA: Diagnosis not present

## 2022-05-29 DIAGNOSIS — I4891 Unspecified atrial fibrillation: Secondary | ICD-10-CM | POA: Diagnosis present

## 2022-05-29 DIAGNOSIS — N210 Calculus in bladder: Secondary | ICD-10-CM | POA: Diagnosis present

## 2022-05-29 DIAGNOSIS — E785 Hyperlipidemia, unspecified: Secondary | ICD-10-CM | POA: Diagnosis present

## 2022-05-29 DIAGNOSIS — R0902 Hypoxemia: Secondary | ICD-10-CM | POA: Diagnosis not present

## 2022-05-29 DIAGNOSIS — W06XXXA Fall from bed, initial encounter: Secondary | ICD-10-CM | POA: Diagnosis present

## 2022-05-29 DIAGNOSIS — I872 Venous insufficiency (chronic) (peripheral): Secondary | ICD-10-CM | POA: Diagnosis present

## 2022-05-29 DIAGNOSIS — M5136 Other intervertebral disc degeneration, lumbar region: Secondary | ICD-10-CM | POA: Diagnosis not present

## 2022-05-29 DIAGNOSIS — E871 Hypo-osmolality and hyponatremia: Secondary | ICD-10-CM | POA: Diagnosis not present

## 2022-05-29 DIAGNOSIS — M47812 Spondylosis without myelopathy or radiculopathy, cervical region: Secondary | ICD-10-CM | POA: Diagnosis not present

## 2022-05-29 DIAGNOSIS — F0283 Dementia in other diseases classified elsewhere, unspecified severity, with mood disturbance: Secondary | ICD-10-CM | POA: Diagnosis not present

## 2022-05-29 DIAGNOSIS — I6381 Other cerebral infarction due to occlusion or stenosis of small artery: Secondary | ICD-10-CM | POA: Diagnosis not present

## 2022-05-29 DIAGNOSIS — Y92003 Bedroom of unspecified non-institutional (private) residence as the place of occurrence of the external cause: Secondary | ICD-10-CM | POA: Diagnosis not present

## 2022-05-29 DIAGNOSIS — E119 Type 2 diabetes mellitus without complications: Secondary | ICD-10-CM | POA: Diagnosis not present

## 2022-05-29 DIAGNOSIS — F03911 Unspecified dementia, unspecified severity, with agitation: Secondary | ICD-10-CM | POA: Diagnosis present

## 2022-05-29 DIAGNOSIS — M8588 Other specified disorders of bone density and structure, other site: Secondary | ICD-10-CM | POA: Diagnosis not present

## 2022-05-29 DIAGNOSIS — R531 Weakness: Secondary | ICD-10-CM | POA: Diagnosis not present

## 2022-05-29 DIAGNOSIS — Z7901 Long term (current) use of anticoagulants: Secondary | ICD-10-CM

## 2022-05-29 DIAGNOSIS — G311 Senile degeneration of brain, not elsewhere classified: Secondary | ICD-10-CM | POA: Diagnosis not present

## 2022-05-29 DIAGNOSIS — J159 Unspecified bacterial pneumonia: Secondary | ICD-10-CM | POA: Diagnosis present

## 2022-05-29 DIAGNOSIS — Z515 Encounter for palliative care: Secondary | ICD-10-CM

## 2022-05-29 DIAGNOSIS — J189 Pneumonia, unspecified organism: Secondary | ICD-10-CM

## 2022-05-29 DIAGNOSIS — J9601 Acute respiratory failure with hypoxia: Secondary | ICD-10-CM | POA: Diagnosis present

## 2022-05-29 DIAGNOSIS — W19XXXA Unspecified fall, initial encounter: Secondary | ICD-10-CM | POA: Diagnosis not present

## 2022-05-29 DIAGNOSIS — R451 Restlessness and agitation: Secondary | ICD-10-CM | POA: Diagnosis not present

## 2022-05-29 DIAGNOSIS — M25551 Pain in right hip: Secondary | ICD-10-CM | POA: Diagnosis not present

## 2022-05-29 DIAGNOSIS — Z87891 Personal history of nicotine dependence: Secondary | ICD-10-CM

## 2022-05-29 DIAGNOSIS — I482 Chronic atrial fibrillation, unspecified: Secondary | ICD-10-CM | POA: Diagnosis present

## 2022-05-29 DIAGNOSIS — J168 Pneumonia due to other specified infectious organisms: Secondary | ICD-10-CM | POA: Diagnosis not present

## 2022-05-29 DIAGNOSIS — Z66 Do not resuscitate: Secondary | ICD-10-CM | POA: Diagnosis not present

## 2022-05-29 DIAGNOSIS — F0394 Unspecified dementia, unspecified severity, with anxiety: Secondary | ICD-10-CM | POA: Diagnosis not present

## 2022-05-29 DIAGNOSIS — R52 Pain, unspecified: Secondary | ICD-10-CM | POA: Diagnosis not present

## 2022-05-29 DIAGNOSIS — E1140 Type 2 diabetes mellitus with diabetic neuropathy, unspecified: Secondary | ICD-10-CM | POA: Diagnosis not present

## 2022-05-29 DIAGNOSIS — Z823 Family history of stroke: Secondary | ICD-10-CM

## 2022-05-29 DIAGNOSIS — I96 Gangrene, not elsewhere classified: Secondary | ICD-10-CM | POA: Diagnosis not present

## 2022-05-29 DIAGNOSIS — I739 Peripheral vascular disease, unspecified: Secondary | ICD-10-CM | POA: Diagnosis not present

## 2022-05-29 DIAGNOSIS — U071 COVID-19: Secondary | ICD-10-CM | POA: Diagnosis present

## 2022-05-29 DIAGNOSIS — L0390 Cellulitis, unspecified: Secondary | ICD-10-CM | POA: Diagnosis not present

## 2022-05-29 DIAGNOSIS — R296 Repeated falls: Secondary | ICD-10-CM | POA: Diagnosis present

## 2022-05-29 DIAGNOSIS — E1152 Type 2 diabetes mellitus with diabetic peripheral angiopathy with gangrene: Secondary | ICD-10-CM | POA: Diagnosis present

## 2022-05-29 DIAGNOSIS — Z043 Encounter for examination and observation following other accident: Secondary | ICD-10-CM | POA: Diagnosis not present

## 2022-05-29 DIAGNOSIS — A4189 Other specified sepsis: Secondary | ICD-10-CM | POA: Diagnosis not present

## 2022-05-29 DIAGNOSIS — Z8673 Personal history of transient ischemic attack (TIA), and cerebral infarction without residual deficits: Secondary | ICD-10-CM

## 2022-05-29 DIAGNOSIS — Z789 Other specified health status: Secondary | ICD-10-CM | POA: Diagnosis not present

## 2022-05-29 DIAGNOSIS — I4510 Unspecified right bundle-branch block: Secondary | ICD-10-CM | POA: Diagnosis present

## 2022-05-29 DIAGNOSIS — R6520 Severe sepsis without septic shock: Secondary | ICD-10-CM | POA: Diagnosis present

## 2022-05-29 DIAGNOSIS — J1282 Pneumonia due to coronavirus disease 2019: Secondary | ICD-10-CM | POA: Diagnosis present

## 2022-05-29 DIAGNOSIS — I2510 Atherosclerotic heart disease of native coronary artery without angina pectoris: Secondary | ICD-10-CM | POA: Diagnosis present

## 2022-05-29 DIAGNOSIS — Z85828 Personal history of other malignant neoplasm of skin: Secondary | ICD-10-CM

## 2022-05-29 DIAGNOSIS — K5730 Diverticulosis of large intestine without perforation or abscess without bleeding: Secondary | ICD-10-CM | POA: Diagnosis not present

## 2022-05-29 DIAGNOSIS — Z7189 Other specified counseling: Secondary | ICD-10-CM | POA: Diagnosis not present

## 2022-05-29 DIAGNOSIS — Z96641 Presence of right artificial hip joint: Secondary | ICD-10-CM | POA: Diagnosis present

## 2022-05-29 DIAGNOSIS — R638 Other symptoms and signs concerning food and fluid intake: Secondary | ICD-10-CM | POA: Diagnosis not present

## 2022-05-29 DIAGNOSIS — N200 Calculus of kidney: Secondary | ICD-10-CM | POA: Diagnosis not present

## 2022-05-29 DIAGNOSIS — I700 Atherosclerosis of aorta: Secondary | ICD-10-CM | POA: Diagnosis not present

## 2022-05-29 DIAGNOSIS — R6889 Other general symptoms and signs: Secondary | ICD-10-CM | POA: Diagnosis present

## 2022-05-29 DIAGNOSIS — R404 Transient alteration of awareness: Secondary | ICD-10-CM | POA: Diagnosis not present

## 2022-05-29 DIAGNOSIS — Z79899 Other long term (current) drug therapy: Secondary | ICD-10-CM

## 2022-05-29 DIAGNOSIS — Z8249 Family history of ischemic heart disease and other diseases of the circulatory system: Secondary | ICD-10-CM

## 2022-05-29 LAB — CBC WITH DIFFERENTIAL/PLATELET
Abs Immature Granulocytes: 0.05 10*3/uL (ref 0.00–0.07)
Basophils Absolute: 0.1 10*3/uL (ref 0.0–0.1)
Basophils Relative: 1 %
Eosinophils Absolute: 0.1 10*3/uL (ref 0.0–0.5)
Eosinophils Relative: 1 %
HCT: 40.2 % (ref 39.0–52.0)
Hemoglobin: 13.4 g/dL (ref 13.0–17.0)
Immature Granulocytes: 0 %
Lymphocytes Relative: 11 %
Lymphs Abs: 1.3 10*3/uL (ref 0.7–4.0)
MCH: 32.9 pg (ref 26.0–34.0)
MCHC: 33.3 g/dL (ref 30.0–36.0)
MCV: 98.8 fL (ref 80.0–100.0)
Monocytes Absolute: 1.2 10*3/uL — ABNORMAL HIGH (ref 0.1–1.0)
Monocytes Relative: 11 %
Neutro Abs: 8.6 10*3/uL — ABNORMAL HIGH (ref 1.7–7.7)
Neutrophils Relative %: 76 %
Platelets: 278 10*3/uL (ref 150–400)
RBC: 4.07 MIL/uL — ABNORMAL LOW (ref 4.22–5.81)
RDW: 14.1 % (ref 11.5–15.5)
WBC: 11.3 10*3/uL — ABNORMAL HIGH (ref 4.0–10.5)
nRBC: 0 % (ref 0.0–0.2)

## 2022-05-29 LAB — URINALYSIS, ROUTINE W REFLEX MICROSCOPIC
Bilirubin Urine: NEGATIVE
Glucose, UA: NEGATIVE mg/dL
Hgb urine dipstick: NEGATIVE
Ketones, ur: 5 mg/dL — AB
Leukocytes,Ua: NEGATIVE
Nitrite: NEGATIVE
Protein, ur: NEGATIVE mg/dL
Specific Gravity, Urine: 1.025 (ref 1.005–1.030)
pH: 6 (ref 5.0–8.0)

## 2022-05-29 LAB — COMPREHENSIVE METABOLIC PANEL
ALT: 28 U/L (ref 0–44)
AST: 36 U/L (ref 15–41)
Albumin: 3.2 g/dL — ABNORMAL LOW (ref 3.5–5.0)
Alkaline Phosphatase: 66 U/L (ref 38–126)
Anion gap: 11 (ref 5–15)
BUN: 16 mg/dL (ref 8–23)
CO2: 23 mmol/L (ref 22–32)
Calcium: 8.6 mg/dL — ABNORMAL LOW (ref 8.9–10.3)
Chloride: 103 mmol/L (ref 98–111)
Creatinine, Ser: 0.86 mg/dL (ref 0.61–1.24)
GFR, Estimated: >60 mL/min (ref >60)
Glucose, Bld: 114 mg/dL — ABNORMAL HIGH (ref 70–99)
Potassium: 4.2 mmol/L (ref 3.5–5.1)
Sodium: 137 mmol/L (ref 135–145)
Total Bilirubin: 0.9 mg/dL (ref 0.3–1.2)
Total Protein: 6.6 g/dL (ref 6.5–8.1)

## 2022-05-29 LAB — LACTIC ACID, PLASMA: Lactic Acid, Venous: 1.9 mmol/L (ref 0.5–1.9)

## 2022-05-29 LAB — MAGNESIUM: Magnesium: 2.1 mg/dL (ref 1.7–2.4)

## 2022-05-29 MED ORDER — IOHEXOL 350 MG/ML SOLN
75.0000 mL | Freq: Once | INTRAVENOUS | Status: AC | PRN
  Administered 2022-05-29: 75 mL via INTRAVENOUS

## 2022-05-29 MED ORDER — SODIUM CHLORIDE 0.9 % IV SOLN
500.0000 mg | Freq: Once | INTRAVENOUS | Status: AC
  Administered 2022-05-29: 500 mg via INTRAVENOUS
  Filled 2022-05-29: qty 5

## 2022-05-29 MED ORDER — LACTATED RINGERS IV BOLUS (SEPSIS)
500.0000 mL | Freq: Once | INTRAVENOUS | Status: AC
  Administered 2022-05-29: 500 mL via INTRAVENOUS

## 2022-05-29 MED ORDER — FENTANYL CITRATE PF 50 MCG/ML IJ SOSY
25.0000 ug | PREFILLED_SYRINGE | INTRAMUSCULAR | Status: AC | PRN
  Administered 2022-05-29 (×2): 25 ug via INTRAVENOUS
  Filled 2022-05-29 (×2): qty 1

## 2022-05-29 MED ORDER — SODIUM CHLORIDE 0.9 % IV SOLN
1.0000 g | Freq: Once | INTRAVENOUS | Status: AC
  Administered 2022-05-29: 1 g via INTRAVENOUS
  Filled 2022-05-29: qty 10

## 2022-05-29 MED ORDER — FENTANYL CITRATE PF 50 MCG/ML IJ SOSY
50.0000 ug | PREFILLED_SYRINGE | Freq: Once | INTRAMUSCULAR | Status: AC
  Administered 2022-05-29: 50 ug via INTRAVENOUS
  Filled 2022-05-29: qty 1

## 2022-05-29 NOTE — ED Notes (Signed)
Patient transported to CT 

## 2022-05-29 NOTE — ED Provider Notes (Signed)
[facility] EMERGENCY DEPARTMENT Provider Note   CSN: [id number] Arrival date & time: 05/29/22  4580     History  Chief Complaint  Patient presents with   Fall   Rt leg pain    Richard Kent is a 89 y.o. male.   Fall  Patient presents after a fall.  Medical history includes HLD, RBBB, T2DM, atrial fibrillation, venous insufficiency, CVA.  He has severe bilateral lymphedema to his lower extremities secondary to venous insufficiency.  He was seen by Ortho 1 week ago.  Legs were wrapped in compression wraps.  He was noted to have ischemic ulcers over the toes.  EMS reports recent operative procedures on his legs although I am not able to identify this in EMR.  At baseline, he is ambulatory but since recent "procedure?"  He has been standing for transfers only.  Earlier this morning, at approximately 5 AM, he had a fall.  EMS was called to his home for lift assist.  They assisted him back into bed.  Later in the morning, he awoke with proximal right leg pain.  He has not been able to bear weight.  For this reason, he presents to the ED.  History per wife: Patient does not ambulate at baseline.  He has not walked in months.  He has ongoing pain in his distal bilateral lower extremities.  He takes tramadol for this.  He is able to stand with assistance normally but this is still painful for him.  Early this morning, she woke up and found him seated on the floor next to the bed, as if he had slid out of it.  He has dementia and typically will try to slide out of the bed.  He does seem to have new pain in his proximal right leg.  Left leg compression wrap was last changed 6 days ago.  Right leg compression wrap was last changed 3 days ago.  He has not undergone any recent procedures on his legs.  He did have a skin biopsy done on his back.     Home Medications Prior to Admission medications   Medication Sig Start Date End Date Taking? Authorizing Provider  acetaminophen  (TYLENOL) 500 MG tablet Take 500 mg by mouth every 6 (six) hours as needed. Patient not taking: Reported on 02/06/2022    [provider]  amLODipine (NORVASC) 2.5 MG tablet Take 1 tablet (2.5 mg total) by mouth daily. 09/19/21   Denita Lung, MD  amoxicillin-clavulanate (AUGMENTIN) 875-125 MG tablet Take 1 tablet by mouth 2 (two) times daily. 05/19/22   Denita Lung, MD  apixaban (ELIQUIS) 5 MG TABS tablet Take 1 tablet (5 mg total) by mouth 2 (two) times daily. 09/19/21   Denita Lung, MD  Ascorbic Acid (VITAMIN C PO) Take by mouth.    [provider]  Blood Glucose Monitoring Suppl (ONE TOUCH ULTRA 2) w/Device KIT 1 each by Does not apply route daily. 01/18/22   Denita Lung, MD  furosemide (LASIX) 20 MG tablet Take 1/2 (one-half) tablet by mouth once daily 03/03/22   Denita Lung, MD  gabapentin (NEURONTIN) 400 MG capsule Take 1 capsule (400 mg total) by mouth 2 (two) times daily. 05/02/22   Denita Lung, MD  glucose blood (ONETOUCH ULTRA) test strip Check blood sugar once daily 03/15/22   Denita Lung, MD  metoprolol succinate (TOPROL XL) 25 MG 24 hr tablet Take 0.5 tablets (12.5 mg total) by mouth daily.  Hold if systolic blood pressure (top blood pressure number) less than 100 mmHg or heart rate less than 60 bpm (pulse). 08/30/21 02/26/22  Tolia, Sunit, DO  Multiple Vitamin (MULTIVITAMIN WITH MINERALS) TABS Take 1 tablet by mouth every morning. Reported on 09/06/2015    [provider]  mupirocin ointment (BACTROBAN) 2 % Apply 1 Application topically 2 (two) times daily. 02/01/22   Denita Lung, MD  OneTouch Delica Lancets 38G MISC 1 each by Does not apply route daily. 04/12/22   Denita Lung, MD  simvastatin (ZOCOR) 20 MG tablet Take 1 tablet (20 mg total) by mouth every evening. 09/19/21   Denita Lung, MD  traMADol (ULTRAM) 50 MG tablet Take 2 tablets (100 mg total) by mouth every 8 (eight) hours as needed. 05/26/22 06/25/22  Denita Lung, MD   VITAMIN D PO Take by mouth.    [provider]      Allergies    Patient has no known allergies.    Review of Systems   Review of Systems  Unable to perform ROS: Dementia    Physical Exam Updated Vital Signs BP (!) 146/69   Pulse 82   Temp 98.7 F (37.1 C)   Resp 20   SpO2 97%  Physical Exam Vitals and nursing note reviewed.  Constitutional:      General: He is not in acute distress.    Appearance: He is well-developed. He is ill-appearing (Chronically). He is not toxic-appearing or diaphoretic.  HENT:     Head: Normocephalic and atraumatic.     Right Ear: External ear normal.     Left Ear: External ear normal.     Nose: Nose normal.     Mouth/Throat:     Mouth: Mucous membranes are moist.  Eyes:     Extraocular Movements: Extraocular movements intact.     Conjunctiva/sclera: Conjunctivae normal.  Cardiovascular:     Rate and Rhythm: Normal rate and regular rhythm.     Heart sounds: No murmur heard. Pulmonary:     Effort: Pulmonary effort is normal. No respiratory distress.     Breath sounds: Normal breath sounds. No wheezing or rales.  Chest:     Chest wall: No tenderness.  Abdominal:     General: There is no distension.     Palpations: Abdomen is soft.     Tenderness: There is no abdominal tenderness.  Musculoskeletal:        General: No swelling.     Cervical back: Normal range of motion and neck supple. No rigidity or tenderness.     Right lower leg: Edema present.     Left lower leg: Edema present.     Comments: Compression wraps in place.  Skin:    General: Skin is warm and dry.     Capillary Refill: Capillary refill takes less than 2 seconds.     Coloration: Skin is not jaundiced.  Neurological:     General: No focal deficit present.     Mental Status: He is alert. Mental status is at baseline.  Psychiatric:        Mood and Affect: Mood normal.        Behavior: Behavior normal.          ED Results / Procedures / Treatments    Labs (all labs ordered are listed, but only abnormal results are displayed) Labs Reviewed  CBC WITH DIFFERENTIAL/PLATELET - Abnormal; Notable for the following components:      Result Value  WBC 11.3 (*)    RBC 4.07 (*)    Neutro Abs 8.6 (*)    Monocytes Absolute 1.2 (*)    All other components within normal limits  COMPREHENSIVE METABOLIC PANEL - Abnormal; Notable for the following components:   Glucose, Bld 114 (*)    Calcium 8.6 (*)    Albumin 3.2 (*)    All other components within normal limits  URINALYSIS, ROUTINE W REFLEX MICROSCOPIC - Abnormal; Notable for the following components:   Ketones, ur 5 (*)    All other components within normal limits  CULTURE, BLOOD (ROUTINE X 2)  CULTURE, BLOOD (ROUTINE X 2)  MAGNESIUM  LACTIC ACID, PLASMA  PROTIME-INR    EKG EKG Interpretation  Date/Time:  Monday May 29 2022 09:39:36 EST Ventricular Rate:  96 PR Interval:    QRS Duration: 134 QT Interval:  385 QTC Calculation: 487 R Axis:   22 Text Interpretation: Atrial fibrillation Right bundle branch block Nonspecific T abnormalities, lateral leads Confirmed by Godfrey Pick [phone]) on 05/29/2022 3:31:08 PM  Radiology CT T-SPINE NO CHARGE  Result Date: 05/29/2022 CLINICAL DATA:  Trauma. EXAM: CT THORACIC AND LUMBAR SPINE WITHOUT CONTRAST TECHNIQUE: Multidetector CT imaging of the thoracic and lumbar spine was performed without contrast. Multiplanar CT image reconstructions were also generated. RADIATION DOSE REDUCTION: This exam was performed according to the departmental dose-optimization program which includes automated exposure control, adjustment of the mA and/or kV according to patient size and/or use of iterative reconstruction technique. COMPARISON:  CT of the chest abdomen pelvis dated 05/29/2022. FINDINGS: CT THORACIC SPINE FINDINGS Alignment: No acute subluxation. Vertebrae: No acute fracture.  Osteopenia. Paraspinal and other soft tissues: No acute findings. No perispinal  fluid collection or hematoma. Disc levels: No acute findings.  Degenerative changes. CT LUMBAR SPINE FINDINGS Segmentation: 5 lumbar type vertebrae. Alignment: No acute subluxation. Vertebrae: No acute fracture.  Osteopenia. Paraspinal and other soft tissues: No acute findings. Disc levels: No acute findings. Multilevel degenerative changes with disc desiccation and vacuum phenomena. IMPRESSION: No acute/traumatic thoracic or lumbar spine pathology. Electronically Signed   By: Anner Crete M.D.   On: 05/29/2022 14:38   CT L-SPINE NO CHARGE  Result Date: 05/29/2022 CLINICAL DATA:  Trauma. EXAM: CT THORACIC AND LUMBAR SPINE WITHOUT CONTRAST TECHNIQUE: Multidetector CT imaging of the thoracic and lumbar spine was performed without contrast. Multiplanar CT image reconstructions were also generated. RADIATION DOSE REDUCTION: This exam was performed according to the departmental dose-optimization program which includes automated exposure control, adjustment of the mA and/or kV according to patient size and/or use of iterative reconstruction technique. COMPARISON:  CT of the chest abdomen pelvis dated 05/29/2022. FINDINGS: CT THORACIC SPINE FINDINGS Alignment: No acute subluxation. Vertebrae: No acute fracture.  Osteopenia. Paraspinal and other soft tissues: No acute findings. No perispinal fluid collection or hematoma. Disc levels: No acute findings.  Degenerative changes. CT LUMBAR SPINE FINDINGS Segmentation: 5 lumbar type vertebrae. Alignment: No acute subluxation. Vertebrae: No acute fracture.  Osteopenia. Paraspinal and other soft tissues: No acute findings. Disc levels: No acute findings. Multilevel degenerative changes with disc desiccation and vacuum phenomena. IMPRESSION: No acute/traumatic thoracic or lumbar spine pathology. Electronically Signed   By: Anner Crete M.D.   On: 05/29/2022 14:38   CT Angio Chest PE W and/or Wo Contrast  Result Date: 05/29/2022 CLINICAL DATA:  Concern for pulmonary  embolism.  Blunt trauma. EXAM: CT ANGIOGRAPHY CHEST CT ABDOMEN AND PELVIS WITH CONTRAST TECHNIQUE: Multidetector CT imaging of the chest was performed using the  standard protocol during bolus administration of intravenous contrast. Multiplanar CT image reconstructions and MIPs were obtained to evaluate the vascular anatomy. Multidetector CT imaging of the abdomen and pelvis was performed using the standard protocol during bolus administration of intravenous contrast. RADIATION DOSE REDUCTION: This exam was performed according to the departmental dose-optimization program which includes automated exposure control, adjustment of the mA and/or kV according to patient size and/or use of iterative reconstruction technique. CONTRAST:  71m OMNIPAQUE IOHEXOL 350 MG/ML SOLN COMPARISON:  Chest radiograph dated 05/29/2022. FINDINGS: CTA CHEST FINDINGS Cardiovascular: There is mild cardiomegaly. No pericardial effusion. Three-vessel coronary vascular calcification. There is moderate atherosclerotic calcification of the thoracic aorta. The aortic isthmus measures approximately 3.5 cm in diameter. No aortic dissection. The origins of the great vessels of the aortic arch appear patent as visualized. Evaluation of the pulmonary arteries is limited due to respiratory motion and streak artifact caused by patient's arms. No pulmonary artery embolus identified. Mediastinum/Nodes: No obvious hilar adenopathy. Mildly enlarged rounded subcarinal lymph node measures 11 mm. The esophagus is grossly unremarkable. No mediastinal fluid collection. Lungs/Pleura: Bilateral lower lobe streaky and bandlike densities and areas of airspace disease primarily involving the left lower lobe may represent atelectasis/scarring. Aspiration or developing infiltrate is not entirely excluded. Clinical correlation is recommended. Bilateral, left greater right, perihilar and peribronchial thickening. There is no pleural effusion or pneumothorax. The central  airways remain patent. Musculoskeletal: Degenerative changes of the spine. No acute osseous pathology. No displaced rib fractures. Evaluation however is limited due to osteopenia and streak artifact caused by patient's arms. Review of the MIP images confirms the above findings. CT ABDOMEN and PELVIS FINDINGS No intra-abdominal free air or free fluid. Hepatobiliary: The liver is grossly unremarkable. No biliary dilatation. No calcified gallstone or pericholecystic fluid. Pancreas: Unremarkable. No pancreatic ductal dilatation or surrounding inflammatory changes. Spleen: Normal in size without focal abnormality. Adrenals/Urinary Tract: The adrenal glands are unremarkable. There is no hydronephrosis on either side. There is symmetric enhancement and excretion of contrast by both kidneys. Small foci of high attenuation within the renal collecting system may represent contrast versus small nonobstructing calculi. The visualized ureters are unremarkable. There is a 2 mm stone along the left posterior bladder wall, likely the recently passed stone. The urinary bladder is otherwise unremarkable. Stomach/Bowel: There is sigmoid diverticulosis without active inflammatory changes. There is no bowel obstruction or active inflammation. The appendix is not visualized with certainty. No inflammatory changes identified in the right lower quadrant. Vascular/Lymphatic: Moderate aortoiliac atherosclerotic disease. The IVC is unremarkable. No portal venous gas. There is no adenopathy. Reproductive: The prostate and seminal vesicles are grossly unremarkable. No pelvic mass. Other: None Musculoskeletal: Osteopenia with degenerative changes of the spine. Total right hip arthroplasty. No acute osseous pathology. Review of the MIP images confirms the above findings. IMPRESSION: 1. No acute intrathoracic, abdominal, or pelvic pathology. No pulmonary artery embolus identified. 2. Bilateral lower lobe atelectasis/scarring. Developing  infiltrate or aspiration, predominantly in the left lower lobe, is not excluded. Clinical correlation is recommended. 3. Probable recently passed 2 mm stone within the urinary bladder. No hydronephrosis. 4. Sigmoid diverticulosis. No bowel obstruction. Electronically Signed   By: AAnner CreteM.D.   On: 05/29/2022 14:29   CT ABDOMEN PELVIS W CONTRAST  Result Date: 05/29/2022 CLINICAL DATA:  Concern for pulmonary embolism.  Blunt trauma. EXAM: CT ANGIOGRAPHY CHEST CT ABDOMEN AND PELVIS WITH CONTRAST TECHNIQUE: Multidetector CT imaging of the chest was performed using the standard protocol during bolus administration of intravenous  contrast. Multiplanar CT image reconstructions and MIPs were obtained to evaluate the vascular anatomy. Multidetector CT imaging of the abdomen and pelvis was performed using the standard protocol during bolus administration of intravenous contrast. RADIATION DOSE REDUCTION: This exam was performed according to the departmental dose-optimization program which includes automated exposure control, adjustment of the mA and/or kV according to patient size and/or use of iterative reconstruction technique. CONTRAST:  54m OMNIPAQUE IOHEXOL 350 MG/ML SOLN COMPARISON:  Chest radiograph dated 05/29/2022. FINDINGS: CTA CHEST FINDINGS Cardiovascular: There is mild cardiomegaly. No pericardial effusion. Three-vessel coronary vascular calcification. There is moderate atherosclerotic calcification of the thoracic aorta. The aortic isthmus measures approximately 3.5 cm in diameter. No aortic dissection. The origins of the great vessels of the aortic arch appear patent as visualized. Evaluation of the pulmonary arteries is limited due to respiratory motion and streak artifact caused by patient's arms. No pulmonary artery embolus identified. Mediastinum/Nodes: No obvious hilar adenopathy. Mildly enlarged rounded subcarinal lymph node measures 11 mm. The esophagus is grossly unremarkable. No  mediastinal fluid collection. Lungs/Pleura: Bilateral lower lobe streaky and bandlike densities and areas of airspace disease primarily involving the left lower lobe may represent atelectasis/scarring. Aspiration or developing infiltrate is not entirely excluded. Clinical correlation is recommended. Bilateral, left greater right, perihilar and peribronchial thickening. There is no pleural effusion or pneumothorax. The central airways remain patent. Musculoskeletal: Degenerative changes of the spine. No acute osseous pathology. No displaced rib fractures. Evaluation however is limited due to osteopenia and streak artifact caused by patient's arms. Review of the MIP images confirms the above findings. CT ABDOMEN and PELVIS FINDINGS No intra-abdominal free air or free fluid. Hepatobiliary: The liver is grossly unremarkable. No biliary dilatation. No calcified gallstone or pericholecystic fluid. Pancreas: Unremarkable. No pancreatic ductal dilatation or surrounding inflammatory changes. Spleen: Normal in size without focal abnormality. Adrenals/Urinary Tract: The adrenal glands are unremarkable. There is no hydronephrosis on either side. There is symmetric enhancement and excretion of contrast by both kidneys. Small foci of high attenuation within the renal collecting system may represent contrast versus small nonobstructing calculi. The visualized ureters are unremarkable. There is a 2 mm stone along the left posterior bladder wall, likely the recently passed stone. The urinary bladder is otherwise unremarkable. Stomach/Bowel: There is sigmoid diverticulosis without active inflammatory changes. There is no bowel obstruction or active inflammation. The appendix is not visualized with certainty. No inflammatory changes identified in the right lower quadrant. Vascular/Lymphatic: Moderate aortoiliac atherosclerotic disease. The IVC is unremarkable. No portal venous gas. There is no adenopathy. Reproductive: The prostate  and seminal vesicles are grossly unremarkable. No pelvic mass. Other: None Musculoskeletal: Osteopenia with degenerative changes of the spine. Total right hip arthroplasty. No acute osseous pathology. Review of the MIP images confirms the above findings. IMPRESSION: 1. No acute intrathoracic, abdominal, or pelvic pathology. No pulmonary artery embolus identified. 2. Bilateral lower lobe atelectasis/scarring. Developing infiltrate or aspiration, predominantly in the left lower lobe, is not excluded. Clinical correlation is recommended. 3. Probable recently passed 2 mm stone within the urinary bladder. No hydronephrosis. 4. Sigmoid diverticulosis. No bowel obstruction. Electronically Signed   By: AAnner CreteM.D.   On: 05/29/2022 14:29   DG Chest 1 View  Result Date: 05/29/2022 CLINICAL DATA:  Fall EXAM: CHEST  1 VIEW COMPARISON:  CXR 09/26/19 FINDINGS: No pleural effusion. No pneumothorax. The left costophrenic angle is excluded from the field of view. Unchanged cardiac and mediastinal contours. No radiographically apparent displaced rib fractures. Visualized upper abdomen is unremarkable.  IMPRESSION: No radiographically apparent displaced rib fractures. Please note the left costophrenic angle is excluded from the field of view. Electronically Signed   By: Marin Roberts M.D.   On: 05/29/2022 10:45   DG Hip Unilat With Pelvis 2-3 Views Right  Result Date: 05/29/2022 CLINICAL DATA:  Right hip pain after fall today. EXAM: DG HIP (WITH OR WITHOUT PELVIS) 2-3V RIGHT COMPARISON:  December 07, 2014. FINDINGS: Status post right hip hemiarthroplasty. No fracture or dislocation is noted. IMPRESSION: No acute abnormality seen. Electronically Signed   By: Marijo Conception M.D.   On: 05/29/2022 10:43   CT HEAD WO CONTRAST  Result Date: 05/29/2022 CLINICAL DATA:  Minor trauma. EXAM: CT HEAD WITHOUT CONTRAST CT CERVICAL SPINE WITHOUT CONTRAST TECHNIQUE: Multidetector CT imaging of the head and cervical spine was performed  following the standard protocol without intravenous contrast. Multiplanar CT image reconstructions of the cervical spine were also generated. RADIATION DOSE REDUCTION: This exam was performed according to the departmental dose-optimization program which includes automated exposure control, adjustment of the mA and/or kV according to patient size and/or use of iterative reconstruction technique. COMPARISON:  01/08/2022 FINDINGS: CT HEAD FINDINGS Brain: Ventricles, cisterns and other CSF spaces are within normal. There is moderate chronic ischemic microvascular disease. Old left basal ganglia lacunar infarct. No mass, mass effect, shift of midline structures or acute hemorrhage. No evidence of acute infarction. Vascular: No hyperdense vessel or unexpected calcification. Skull: Normal. Negative for fracture or focal lesion. Sinuses/Orbits: No acute finding. Other: None. CT CERVICAL SPINE FINDINGS Alignment: Normal. Skull base and vertebrae: Vertebral body heights are normal. There is moderate spondylosis throughout the cervical spine to include uncovertebral joint spurring and facet arthropathy. Atlantoaxial articulation is normal. No acute fracture. Mild left-sided neural foraminal narrowing at the C3-4 level with mild bilateral neural foraminal narrowing at the C4-5 level. Moderate to severe bilateral neural foraminal narrowing at the C6-7 level greater than the C5-6 level. Soft tissues and spinal canal: Prevertebral soft tissues are normal. Minimal narrowing of the AP diameter of the spinal canal at the C5-6 level. Disc levels: Disc space narrowing from the C3-4 level to the C7-T1 level. Upper chest: No acute findings. Other: None. IMPRESSION: 1. No acute brain injury. 2. Moderate chronic ischemic microvascular disease. Old left basal ganglia lacunar infarct. 3. No acute cervical spine injury. 4. Moderate spondylosis throughout the cervical spine with disc disease from the C3-4 level to the C7-T1 level. Multilevel  neural foraminal narrowing as described. Minimal narrowing of the AP diameter of the spinal canal at the C5-6 level. Electronically Signed   By: Marin Olp M.D.   On: 05/29/2022 09:34   CT CERVICAL SPINE WO CONTRAST  Result Date: 05/29/2022 CLINICAL DATA:  Minor trauma. EXAM: CT HEAD WITHOUT CONTRAST CT CERVICAL SPINE WITHOUT CONTRAST TECHNIQUE: Multidetector CT imaging of the head and cervical spine was performed following the standard protocol without intravenous contrast. Multiplanar CT image reconstructions of the cervical spine were also generated. RADIATION DOSE REDUCTION: This exam was performed according to the departmental dose-optimization program which includes automated exposure control, adjustment of the mA and/or kV according to patient size and/or use of iterative reconstruction technique. COMPARISON:  01/08/2022 FINDINGS: CT HEAD FINDINGS Brain: Ventricles, cisterns and other CSF spaces are within normal. There is moderate chronic ischemic microvascular disease. Old left basal ganglia lacunar infarct. No mass, mass effect, shift of midline structures or acute hemorrhage. No evidence of acute infarction. Vascular: No hyperdense vessel or unexpected calcification. Skull: Normal. Negative  for fracture or focal lesion. Sinuses/Orbits: No acute finding. Other: None. CT CERVICAL SPINE FINDINGS Alignment: Normal. Skull base and vertebrae: Vertebral body heights are normal. There is moderate spondylosis throughout the cervical spine to include uncovertebral joint spurring and facet arthropathy. Atlantoaxial articulation is normal. No acute fracture. Mild left-sided neural foraminal narrowing at the C3-4 level with mild bilateral neural foraminal narrowing at the C4-5 level. Moderate to severe bilateral neural foraminal narrowing at the C6-7 level greater than the C5-6 level. Soft tissues and spinal canal: Prevertebral soft tissues are normal. Minimal narrowing of the AP diameter of the spinal canal at  the C5-6 level. Disc levels: Disc space narrowing from the C3-4 level to the C7-T1 level. Upper chest: No acute findings. Other: None. IMPRESSION: 1. No acute brain injury. 2. Moderate chronic ischemic microvascular disease. Old left basal ganglia lacunar infarct. 3. No acute cervical spine injury. 4. Moderate spondylosis throughout the cervical spine with disc disease from the C3-4 level to the C7-T1 level. Multilevel neural foraminal narrowing as described. Minimal narrowing of the AP diameter of the spinal canal at the C5-6 level. Electronically Signed   By: Marin Olp M.D.   On: 05/29/2022 09:34    Procedures Procedures    Medications Ordered in ED Medications  fentaNYL (SUBLIMAZE) injection 25 mcg (25 mcg Intravenous Given 05/29/22 1319)  lactated ringers bolus 500 mL (0 mLs Intravenous Stopped 05/29/22 1246)  fentaNYL (SUBLIMAZE) injection 50 mcg (50 mcg Intravenous Given 05/29/22 1229)  iohexol (OMNIPAQUE) 350 MG/ML injection 75 mL (75 mLs Intravenous Contrast Given 05/29/22 1350)    ED Course/ Medical Decision Making/ A&P                           Medical Decision Making Amount and/or Complexity of Data Reviewed Labs: ordered. Radiology: ordered.  Risk Prescription drug management.   This patient presents to the ED for concern of fall, this involves an extensive number of treatment options, and is a complaint that carries with it a high risk of complications and morbidity.  The differential diagnosis includes acute injuries   Co morbidities that complicate the patient evaluation  Dementia, HLD, RBBB, T2DM, atrial fibrillation, venous insufficiency, CVA with residual right-sided deficits, BLE lymphedema   Additional history obtained:  Additional history obtained from EMS, patient's wife External records from outside source obtained and reviewed including EMR   Lab Tests:  I Ordered, and personally interpreted labs.  The pertinent results include: A mild leukocytosis is  present.  Hemoglobin is normal.  Kidney function and electrolytes are normal.  No evidence of UTI   Imaging Studies ordered:  I ordered imaging studies including x-ray imaging of chest and right hip; CT imaging of head, cervical spine, chest, abdomen, pelvis, T-spine, L-spine I independently visualized and interpreted imaging which showed 2 mm stone in bladder suggesting recent passage without other acute findings I agree with the radiologist interpretation   Cardiac Monitoring: / EKG:  The patient was maintained on a cardiac monitor.  I personally viewed and interpreted the cardiac monitored which showed an underlying rhythm of: Atrial fibrillation   Problem List / ED Course / Critical interventions / Medication management  Patient presents to the ED for proximal right leg pain following an unwitnessed fall out of his bed earlier this morning.  History is provided by both EMS and his wife, who joins him in the ED shortly after his arrival.  On arrival, patient's vital signs are reassuring.  He is disoriented and wife confirms that this is baseline.  He has no external evidence of trauma.  He has compression wraps on his bilateral lower extremities.  On removal of these, there is no underlying areas of concerning erythema.  He does have swelling and some signs of necrosis of his toes.  This is currently being managed by orthopedic surgery.  Patient also was complaining of testicular pain.  On direct inspection, with chaperone present, he does have some mild erythema suggestive of contact dermatitis from a wet diaper.  Diaper was removed.  Patient has no testicular swelling or tenderness.  Lab work and imaging studies were ordered to assess for acute injuries.  On pelvic x-ray, there was no evidence of right hip injury.  CT imaging was ordered to further evaluate.  Given the unwitnessed nature of this fall, pan scans were ordered.  While in the ED, patient was given fentanyl for analgesia.  On  reassessment, it does not appear that he is not any longer in pain.  When asked, he simply smiles and laughs.  On CT imaging, there is a 2 mm stone in his bladder.  This could suggest recent passage which would explain his groin and testicle pain.  Imaging was negative for any other acute findings.  I discussed with this the patient's wife.  She does feel comfortable with discharge home.  She has 3 adult grandchildren who live with her and are able to help assist with caring for this patient with dementia.  Urinalysis did not show evidence of infection.  Compression wraps to be redone here in the ED and plan will be for discharge.  Care of patient was signed out to oncoming ED provider. I ordered medication including fentanyl for analgesia; IV fluid for hydration Reevaluation of the patient after these medicines showed that the patient improved I have reviewed the patients home medicines and have made adjustments as needed   Social Determinants of Health:  Lives at home with family, dementia at baseline         Final Clinical Impression(s) / ED Diagnoses Final diagnoses:  Fall, initial encounter    Rx / DC Orders ED Discharge Orders     None         Godfrey Pick, MD 05/29/22 1700

## 2022-05-29 NOTE — ED Provider Notes (Signed)
Patient was initially signed out to me pending discharge after evaluation by Dr. Doren Custard for abdominal/groin pain after a fall. His work up showed evidence of a recently passed kidney stone and was planned for discharge.  I was alerted by nursing the patient had discharge vital signs performed when PTR arrived for transport home and was found to be hypoxic to 85% with a good waveform.  Patient reports that he has been having a cough recently and upon review of his vitals in the ED he has been satting between 98 to 94% previously.  Patient had a CT PE study performed that showed concern for a developing left lower lobe pneumonia and he will be started on antibiotics and will require admission in the setting of his hypoxia.   Kemper Durie, DO 05/29/22 2230

## 2022-05-29 NOTE — Discharge Instructions (Signed)
Follow-up with your primary care doctor for management of chronic pain.  Return to the emergency department at anytime for any new or worsening symptoms of concern.

## 2022-05-29 NOTE — ED Notes (Signed)
Pt's O2 ranging 84-86% on RA, pt placed on 2lpm [place]. O2 ranging 94-98%.

## 2022-05-29 NOTE — ED Triage Notes (Signed)
Pt BIB [facility] from home d/t slipping out of bed from a sitting position. Family states that it happened around 4/5am & that he did not hit his head & called EMS to help get him out of the floor. He then kept c/o Rt femur pain so family decided to have him brought in for evaluation. Dementia at baseline, wife reported he is at baseline, does take Eliquis & has had none of his daily meds so far today. During transfers by EMS since pt cannot stand he had a skin tear to his Left arm get re-opened. 170/78, 82 bpm, 94% on RA, CBG 133.

## 2022-05-29 NOTE — Telephone Encounter (Signed)
I have written order and called pt and left a message with someone at their home and they said they will call back.  Looked in chart and pt is in ED right now due to fall.   I will put order in brown folder for pick up. They can take to medical supply place to get wheelchair

## 2022-05-29 NOTE — ED Notes (Signed)
Got patient on the monitor did vutals patient is resting with call bell in reach

## 2022-05-29 NOTE — ED Notes (Signed)
Ptar Called [facility] out for dispatch, will be a significant wait time

## 2022-05-30 DIAGNOSIS — U071 COVID-19: Secondary | ICD-10-CM

## 2022-05-30 DIAGNOSIS — I2510 Atherosclerotic heart disease of native coronary artery without angina pectoris: Secondary | ICD-10-CM | POA: Insufficient documentation

## 2022-05-30 DIAGNOSIS — J9601 Acute respiratory failure with hypoxia: Secondary | ICD-10-CM | POA: Insufficient documentation

## 2022-05-30 LAB — RESP PANEL BY RT-PCR (RSV, FLU A&B, COVID)  RVPGX2
Influenza A by PCR: NEGATIVE
Influenza B by PCR: NEGATIVE
Resp Syncytial Virus by PCR: NEGATIVE
SARS Coronavirus 2 by RT PCR: POSITIVE — AB

## 2022-05-30 LAB — LACTATE DEHYDROGENASE: LDH: 220 U/L — ABNORMAL HIGH (ref 98–192)

## 2022-05-30 LAB — CBC
HCT: 37.5 % — ABNORMAL LOW (ref 39.0–52.0)
Hemoglobin: 12.4 g/dL — ABNORMAL LOW (ref 13.0–17.0)
MCH: 32.5 pg (ref 26.0–34.0)
MCHC: 33.1 g/dL (ref 30.0–36.0)
MCV: 98.2 fL (ref 80.0–100.0)
Platelets: 258 10*3/uL (ref 150–400)
RBC: 3.82 MIL/uL — ABNORMAL LOW (ref 4.22–5.81)
RDW: 14.2 % (ref 11.5–15.5)
WBC: 10.9 10*3/uL — ABNORMAL HIGH (ref 4.0–10.5)
nRBC: 0 % (ref 0.0–0.2)

## 2022-05-30 LAB — FERRITIN: Ferritin: 90 ng/mL (ref 24–336)

## 2022-05-30 LAB — GLUCOSE, CAPILLARY
Glucose-Capillary: 109 mg/dL — ABNORMAL HIGH (ref 70–99)
Glucose-Capillary: 107 mg/dL — ABNORMAL HIGH (ref 70–99)
Glucose-Capillary: 106 mg/dL — ABNORMAL HIGH (ref 70–99)
Glucose-Capillary: 177 mg/dL — ABNORMAL HIGH (ref 70–99)
Glucose-Capillary: 218 mg/dL — ABNORMAL HIGH (ref 70–99)

## 2022-05-30 LAB — C-REACTIVE PROTEIN: CRP: 4.3 mg/dL — ABNORMAL HIGH (ref <1.0)

## 2022-05-30 MED ORDER — DEXAMETHASONE SODIUM PHOSPHATE 10 MG/ML IJ SOLN
6.0000 mg | INTRAMUSCULAR | Status: DC
  Administered 2022-05-30 – 2022-06-05 (×7): 6 mg via INTRAVENOUS
  Filled 2022-05-30 (×7): qty 0.6

## 2022-05-30 MED ORDER — INSULIN ASPART 100 UNIT/ML IJ SOLN: 0.0000 [IU] | Freq: Every day | INTRAMUSCULAR | Status: DC

## 2022-05-30 MED ORDER — GABAPENTIN 400 MG PO CAPS
400.0000 mg | ORAL_CAPSULE | Freq: Two times a day (BID) | ORAL | Status: DC
  Administered 2022-05-30 – 2022-07-02 (×64): 400 mg via ORAL
  Filled 2022-05-30 (×68): qty 1

## 2022-05-30 MED ORDER — INSULIN ASPART 100 UNIT/ML IJ SOLN: 0.0000 [IU] | INTRAMUSCULAR | Status: DC

## 2022-05-30 MED ORDER — SIMVASTATIN 20 MG PO TABS
20.0000 mg | ORAL_TABLET | Freq: Every evening | ORAL | Status: DC
  Administered 2022-05-30 – 2022-06-04 (×6): 20 mg via ORAL
  Filled 2022-05-30 (×6): qty 1

## 2022-05-30 MED ORDER — SODIUM CHLORIDE 0.9 % IV SOLN
100.0000 mg | Freq: Every day | INTRAVENOUS | Status: AC
  Administered 2022-05-31 – 2022-06-01 (×2): 100 mg via INTRAVENOUS
  Filled 2022-05-30 (×2): qty 20

## 2022-05-30 MED ORDER — INSULIN ASPART 100 UNIT/ML IJ SOLN
0.0000 [IU] | Freq: Three times a day (TID) | INTRAMUSCULAR | Status: DC
  Administered 2022-05-30: 2 [IU] via SUBCUTANEOUS
  Administered 2022-05-31 – 2022-06-01 (×2): 3 [IU] via SUBCUTANEOUS
  Administered 2022-06-02 (×2): 2 [IU] via SUBCUTANEOUS
  Administered 2022-06-03: 3 [IU] via SUBCUTANEOUS
  Administered 2022-06-03 – 2022-06-04 (×2): 2 [IU] via SUBCUTANEOUS
  Administered 2022-06-04: 5 [IU] via SUBCUTANEOUS
  Administered 2022-06-05 (×2): 2 [IU] via SUBCUTANEOUS

## 2022-05-30 MED ORDER — SODIUM CHLORIDE 0.9 % IV SOLN
200.0000 mg | Freq: Once | INTRAVENOUS | Status: AC
  Administered 2022-05-30: 200 mg via INTRAVENOUS
  Filled 2022-05-30: qty 40

## 2022-05-30 MED ORDER — ACETAMINOPHEN 325 MG PO TABS: 650.0000 mg | ORAL_TABLET | Freq: Four times a day (QID) | ORAL | Status: DC | PRN

## 2022-05-30 MED ORDER — APIXABAN 5 MG PO TABS
5.0000 mg | ORAL_TABLET | Freq: Two times a day (BID) | ORAL | Status: DC
  Administered 2022-05-30 – 2022-06-05 (×13): 5 mg via ORAL
  Filled 2022-05-30 (×13): qty 1

## 2022-05-30 MED ORDER — METOPROLOL SUCCINATE ER 25 MG PO TB24
12.5000 mg | ORAL_TABLET | Freq: Every day | ORAL | Status: DC
  Administered 2022-05-30 – 2022-06-05 (×6): 12.5 mg via ORAL
  Filled 2022-05-30 (×7): qty 1

## 2022-05-30 MED ORDER — TRAMADOL HCL 50 MG PO TABS
100.0000 mg | ORAL_TABLET | Freq: Three times a day (TID) | ORAL | Status: DC | PRN
  Administered 2022-05-30 – 2022-06-01 (×3): 100 mg via ORAL
  Filled 2022-05-30 (×3): qty 2

## 2022-05-30 MED ORDER — INSULIN ASPART 100 UNIT/ML IJ SOLN
0.0000 [IU] | Freq: Every day | INTRAMUSCULAR | Status: DC
  Administered 2022-05-30 – 2022-06-04 (×3): 2 [IU] via SUBCUTANEOUS

## 2022-05-30 MED ORDER — ACETAMINOPHEN 500 MG PO TABS
500.0000 mg | ORAL_TABLET | Freq: Four times a day (QID) | ORAL | Status: DC | PRN
  Administered 2022-05-31 – 2022-06-03 (×3): 500 mg via ORAL
  Filled 2022-05-30 (×3): qty 1

## 2022-05-30 MED ORDER — ACETAMINOPHEN 650 MG RE SUPP: 650.0000 mg | Freq: Four times a day (QID) | RECTAL | Status: DC | PRN

## 2022-05-30 MED ORDER — INSULIN ASPART 100 UNIT/ML IJ SOLN: 0.0000 [IU] | Freq: Three times a day (TID) | INTRAMUSCULAR | Status: DC

## 2022-05-30 MED ORDER — FUROSEMIDE 20 MG PO TABS
10.0000 mg | ORAL_TABLET | Freq: Every day | ORAL | Status: DC
  Administered 2022-05-30 – 2022-07-02 (×31): 10 mg via ORAL
  Filled 2022-05-30 (×33): qty 1

## 2022-05-30 MED ORDER — SODIUM CHLORIDE 0.9 % IV SOLN
3.0000 g | Freq: Four times a day (QID) | INTRAVENOUS | Status: AC
  Administered 2022-05-30 – 2022-06-03 (×20): 3 g via INTRAVENOUS
  Filled 2022-05-30 (×20): qty 8

## 2022-05-30 MED ORDER — AMLODIPINE BESYLATE 2.5 MG PO TABS
2.5000 mg | ORAL_TABLET | Freq: Every day | ORAL | Status: DC
  Administered 2022-05-30 – 2022-06-05 (×7): 2.5 mg via ORAL
  Filled 2022-05-30 (×7): qty 1

## 2022-05-30 NOTE — ED Notes (Signed)
ED TO INPATIENT HANDOFF REPORT  ED Nurse Name and Phone #: [phone]  S Name/Age/Gender Richard Kent 89 y.o. male Room/Bed: 010C/010C  Code Status   Code Status: Prior  Home/SNF/Other Home Patient oriented to: self Is this baseline? Yes   Triage Complete: Triage complete  Chief Complaint Pneumonia [J18.9]  Triage Note Pt BIB [facility] from home d/t slipping out of bed from a sitting position. Family states that it happened around 4/5am & that he did not hit his head & called EMS to help get him out of the floor. He then kept c/o Rt femur pain so family decided to have him brought in for evaluation. Dementia at baseline, wife reported he is at baseline, does take Eliquis & has had none of his daily meds so far today. During transfers by EMS since pt cannot stand he had a skin tear to his Left arm get re-opened. 170/78, 82 bpm, 94% on RA, CBG 133.    Allergies No Known Allergies  Level of Care/Admitting Diagnosis ED Disposition     ED Disposition  Admit   Condition  --   Comment  Hospital Area: [facility] [[id number]]  Level of Care: Telemetry Medical [104]  May [facility] patient to [facility] or [facility] if equivalent level of care is available:: Yes  Covid Evaluation: Asymptomatic - no recent exposure (last 10 days) testing not required  Diagnosis: Pneumonia [633354]  Admitting Physician: Shela Leff [[id number]]  Attending Physician: Shela Leff [[id number]]  Certification:: I certify this patient will need inpatient services for at least 2 midnights  Estimated Length of Stay: 2          B Medical/Surgery History Past Medical History:  Diagnosis Date   Carotid stenosis    CVA (cerebral infarction) 2011   Diverticulosis    Hyperlipidemia    Hypertension    Kidney stone    Smoker    Stroke ([place]) 11/17/09   Past Surgical History:  Procedure Laterality Date   APPENDECTOMY     HERNIA REPAIR     RIGHT   HIP ARTHROPLASTY  01/15/2012    Procedure: ARTHROPLASTY BIPOLAR HIP;  Surgeon: Rozanna Box, MD;  Location: [place] [facility];  Service: Orthopedics;  Laterality: Right;     A IV Location/Drains/Wounds Patient Lines/Drains/Airways Status     Active Line/Drains/Airways     Name Placement date Placement time Site Days   Peripheral IV 05/29/22 20 G Anterior;Left Forearm 05/29/22  0856  Forearm  1            Intake/Output Last 24 hours No intake or output data in the 24 hours ending 05/30/22 0209  Labs/Imaging Results for orders placed or performed during the hospital encounter of 05/29/22 (from the past 48 hour(s))  CBC with Differential     Status: Abnormal   Collection Time: 05/29/22  9:02 AM  Result Value Ref Range   WBC 11.3 (H) 4.0 - 10.5 K/uL   RBC 4.07 (L) 4.22 - 5.81 MIL/uL   Hemoglobin 13.4 13.0 - 17.0 g/dL   HCT 40.2 39.0 - 52.0 %   MCV 98.8 80.0 - 100.0 fL   MCH 32.9 26.0 - 34.0 pg   MCHC 33.3 30.0 - 36.0 g/dL   RDW 14.1 11.5 - 15.5 %   Platelets 278 150 - 400 K/uL   nRBC 0.0 0.0 - 0.2 %   Neutrophils Relative % 76 %   Neutro Abs 8.6 (H) 1.7 - 7.7 K/uL   Lymphocytes Relative 11 %  Lymphs Abs 1.3 0.7 - 4.0 K/uL   Monocytes Relative 11 %   Monocytes Absolute 1.2 (H) 0.1 - 1.0 K/uL   Eosinophils Relative 1 %   Eosinophils Absolute 0.1 0.0 - 0.5 K/uL   Basophils Relative 1 %   Basophils Absolute 0.1 0.0 - 0.1 K/uL   Immature Granulocytes 0 %   Abs Immature Granulocytes 0.05 0.00 - 0.07 K/uL    Comment: Performed at [facility] [address], [place], [place] [address]  Comprehensive metabolic panel     Status: Abnormal   Collection Time: 05/29/22  9:02 AM  Result Value Ref Range   Sodium 137 135 - 145 mmol/L   Potassium 4.2 3.5 - 5.1 mmol/L   Chloride 103 98 - 111 mmol/L   CO2 23 22 - 32 mmol/L   Glucose, Bld 114 (H) 70 - 99 mg/dL    Comment: Glucose reference range applies only to samples taken after fasting for at least 8 hours.   BUN 16 8 - 23 mg/dL   Creatinine, Ser 0.86 0.61 -  1.24 mg/dL   Calcium 8.6 (L) 8.9 - 10.3 mg/dL   Total Protein 6.6 6.5 - 8.1 g/dL   Albumin 3.2 (L) 3.5 - 5.0 g/dL   AST 36 15 - 41 U/L   ALT 28 0 - 44 U/L   Alkaline Phosphatase 66 38 - 126 U/L   Total Bilirubin 0.9 0.3 - 1.2 mg/dL   GFR, Estimated >60 >60 mL/min    Comment: (NOTE) Calculated using the CKD-EPI Creatinine Equation (2021)    Anion gap 11 5 - 15    Comment: Performed at [facility] [address] [place], [place] [address]  Magnesium     Status: None   Collection Time: 05/29/22  9:02 AM  Result Value Ref Range   Magnesium 2.1 1.7 - 2.4 mg/dL    Comment: Performed at [facility] [address] [place], [place] [address]  Lactic acid, plasma     Status: None   Collection Time: 05/29/22 12:50 PM  Result Value Ref Range   Lactic Acid, Venous 1.9 0.5 - 1.9 mmol/L    Comment: Performed at [facility] [address], [place],  [address]  Urinalysis, Routine w reflex microscopic     Status: Abnormal   Collection Time: 05/29/22  3:49 PM  Result Value Ref Range   Color, Urine YELLOW YELLOW   APPearance CLEAR CLEAR   Specific Gravity, Urine 1.025 1.005 - 1.030   pH 6.0 5.0 - 8.0   Glucose, UA NEGATIVE NEGATIVE mg/dL   Hgb urine dipstick NEGATIVE NEGATIVE   Bilirubin Urine NEGATIVE NEGATIVE   Ketones, ur 5 (A) NEGATIVE mg/dL   Protein, ur NEGATIVE NEGATIVE mg/dL   Nitrite NEGATIVE NEGATIVE   Leukocytes,Ua NEGATIVE NEGATIVE    Comment: Performed at [facility] [address], [place],  [address]   CT T-SPINE NO CHARGE  Result Date: 05/29/2022 CLINICAL DATA:  Trauma. EXAM: CT THORACIC AND LUMBAR SPINE WITHOUT CONTRAST TECHNIQUE: Multidetector CT imaging of the thoracic and lumbar spine was performed without contrast. Multiplanar CT image reconstructions were also generated. RADIATION DOSE REDUCTION: This exam was performed according to the departmental dose-optimization program which includes automated exposure control,  adjustment of the mA and/or kV according to patient size and/or use of iterative reconstruction technique. COMPARISON:  CT of the chest abdomen pelvis dated 05/29/2022. FINDINGS: CT THORACIC SPINE FINDINGS Alignment: No acute subluxation.  Vertebrae: No acute fracture.  Osteopenia. Paraspinal and other soft tissues: No acute findings. No perispinal fluid collection or hematoma. Disc levels: No acute findings.  Degenerative changes. CT LUMBAR SPINE FINDINGS Segmentation: 5 lumbar type vertebrae. Alignment: No acute subluxation. Vertebrae: No acute fracture.  Osteopenia. Paraspinal and other soft tissues: No acute findings. Disc levels: No acute findings. Multilevel degenerative changes with disc desiccation and vacuum phenomena. IMPRESSION: No acute/traumatic thoracic or lumbar spine pathology. Electronically Signed   By: Anner Crete M.D.   On: 05/29/2022 14:38   CT L-SPINE NO CHARGE  Result Date: 05/29/2022 CLINICAL DATA:  Trauma. EXAM: CT THORACIC AND LUMBAR SPINE WITHOUT CONTRAST TECHNIQUE: Multidetector CT imaging of the thoracic and lumbar spine was performed without contrast. Multiplanar CT image reconstructions were also generated. RADIATION DOSE REDUCTION: This exam was performed according to the departmental dose-optimization program which includes automated exposure control, adjustment of the mA and/or kV according to patient size and/or use of iterative reconstruction technique. COMPARISON:  CT of the chest abdomen pelvis dated 05/29/2022. FINDINGS: CT THORACIC SPINE FINDINGS Alignment: No acute subluxation. Vertebrae: No acute fracture.  Osteopenia. Paraspinal and other soft tissues: No acute findings. No perispinal fluid collection or hematoma. Disc levels: No acute findings.  Degenerative changes. CT LUMBAR SPINE FINDINGS Segmentation: 5 lumbar type vertebrae. Alignment: No acute subluxation. Vertebrae: No acute fracture.  Osteopenia. Paraspinal and other soft tissues: No acute findings. Disc  levels: No acute findings. Multilevel degenerative changes with disc desiccation and vacuum phenomena. IMPRESSION: No acute/traumatic thoracic or lumbar spine pathology. Electronically Signed   By: Anner Crete M.D.   On: 05/29/2022 14:38   CT Angio Chest PE W and/or Wo Contrast  Result Date: 05/29/2022 CLINICAL DATA:  Concern for pulmonary embolism.  Blunt trauma. EXAM: CT ANGIOGRAPHY CHEST CT ABDOMEN AND PELVIS WITH CONTRAST TECHNIQUE: Multidetector CT imaging of the chest was performed using the standard protocol during bolus administration of intravenous contrast. Multiplanar CT image reconstructions and MIPs were obtained to evaluate the vascular anatomy. Multidetector CT imaging of the abdomen and pelvis was performed using the standard protocol during bolus administration of intravenous contrast. RADIATION DOSE REDUCTION: This exam was performed according to the departmental dose-optimization program which includes automated exposure control, adjustment of the mA and/or kV according to patient size and/or use of iterative reconstruction technique. CONTRAST:  80m OMNIPAQUE IOHEXOL 350 MG/ML SOLN COMPARISON:  Chest radiograph dated 05/29/2022. FINDINGS: CTA CHEST FINDINGS Cardiovascular: There is mild cardiomegaly. No pericardial effusion. Three-vessel coronary vascular calcification. There is moderate atherosclerotic calcification of the thoracic aorta. The aortic isthmus measures approximately 3.5 cm in diameter. No aortic dissection. The origins of the great vessels of the aortic arch appear patent as visualized. Evaluation of the pulmonary arteries is limited due to respiratory motion and streak artifact caused by patient's arms. No pulmonary artery embolus identified. Mediastinum/Nodes: No obvious hilar adenopathy. Mildly enlarged rounded subcarinal lymph node measures 11 mm. The esophagus is grossly unremarkable. No mediastinal fluid collection. Lungs/Pleura: Bilateral lower lobe streaky and  bandlike densities and areas of airspace disease primarily involving the left lower lobe may represent atelectasis/scarring. Aspiration or developing infiltrate is not entirely excluded. Clinical correlation is recommended. Bilateral, left greater right, perihilar and peribronchial thickening. There is no pleural effusion or pneumothorax. The central airways remain patent. Musculoskeletal: Degenerative changes of the spine. No acute osseous pathology. No displaced rib fractures. Evaluation however is limited due to osteopenia and streak artifact caused by patient's arms. Review of the MIP images confirms the  above findings. CT ABDOMEN and PELVIS FINDINGS No intra-abdominal free air or free fluid. Hepatobiliary: The liver is grossly unremarkable. No biliary dilatation. No calcified gallstone or pericholecystic fluid. Pancreas: Unremarkable. No pancreatic ductal dilatation or surrounding inflammatory changes. Spleen: Normal in size without focal abnormality. Adrenals/Urinary Tract: The adrenal glands are unremarkable. There is no hydronephrosis on either side. There is symmetric enhancement and excretion of contrast by both kidneys. Small foci of high attenuation within the renal collecting system may represent contrast versus small nonobstructing calculi. The visualized ureters are unremarkable. There is a 2 mm stone along the left posterior bladder wall, likely the recently passed stone. The urinary bladder is otherwise unremarkable. Stomach/Bowel: There is sigmoid diverticulosis without active inflammatory changes. There is no bowel obstruction or active inflammation. The appendix is not visualized with certainty. No inflammatory changes identified in the right lower quadrant. Vascular/Lymphatic: Moderate aortoiliac atherosclerotic disease. The IVC is unremarkable. No portal venous gas. There is no adenopathy. Reproductive: The prostate and seminal vesicles are grossly unremarkable. No pelvic mass. Other: None  Musculoskeletal: Osteopenia with degenerative changes of the spine. Total right hip arthroplasty. No acute osseous pathology. Review of the MIP images confirms the above findings. IMPRESSION: 1. No acute intrathoracic, abdominal, or pelvic pathology. No pulmonary artery embolus identified. 2. Bilateral lower lobe atelectasis/scarring. Developing infiltrate or aspiration, predominantly in the left lower lobe, is not excluded. Clinical correlation is recommended. 3. Probable recently passed 2 mm stone within the urinary bladder. No hydronephrosis. 4. Sigmoid diverticulosis. No bowel obstruction. Electronically Signed   By: Anner Crete M.D.   On: 05/29/2022 14:29   CT ABDOMEN PELVIS W CONTRAST  Result Date: 05/29/2022 CLINICAL DATA:  Concern for pulmonary embolism.  Blunt trauma. EXAM: CT ANGIOGRAPHY CHEST CT ABDOMEN AND PELVIS WITH CONTRAST TECHNIQUE: Multidetector CT imaging of the chest was performed using the standard protocol during bolus administration of intravenous contrast. Multiplanar CT image reconstructions and MIPs were obtained to evaluate the vascular anatomy. Multidetector CT imaging of the abdomen and pelvis was performed using the standard protocol during bolus administration of intravenous contrast. RADIATION DOSE REDUCTION: This exam was performed according to the departmental dose-optimization program which includes automated exposure control, adjustment of the mA and/or kV according to patient size and/or use of iterative reconstruction technique. CONTRAST:  81m OMNIPAQUE IOHEXOL 350 MG/ML SOLN COMPARISON:  Chest radiograph dated 05/29/2022. FINDINGS: CTA CHEST FINDINGS Cardiovascular: There is mild cardiomegaly. No pericardial effusion. Three-vessel coronary vascular calcification. There is moderate atherosclerotic calcification of the thoracic aorta. The aortic isthmus measures approximately 3.5 cm in diameter. No aortic dissection. The origins of the great vessels of the aortic arch  appear patent as visualized. Evaluation of the pulmonary arteries is limited due to respiratory motion and streak artifact caused by patient's arms. No pulmonary artery embolus identified. Mediastinum/Nodes: No obvious hilar adenopathy. Mildly enlarged rounded subcarinal lymph node measures 11 mm. The esophagus is grossly unremarkable. No mediastinal fluid collection. Lungs/Pleura: Bilateral lower lobe streaky and bandlike densities and areas of airspace disease primarily involving the left lower lobe may represent atelectasis/scarring. Aspiration or developing infiltrate is not entirely excluded. Clinical correlation is recommended. Bilateral, left greater right, perihilar and peribronchial thickening. There is no pleural effusion or pneumothorax. The central airways remain patent. Musculoskeletal: Degenerative changes of the spine. No acute osseous pathology. No displaced rib fractures. Evaluation however is limited due to osteopenia and streak artifact caused by patient's arms. Review of the MIP images confirms the above findings. CT ABDOMEN and PELVIS FINDINGS  No intra-abdominal free air or free fluid. Hepatobiliary: The liver is grossly unremarkable. No biliary dilatation. No calcified gallstone or pericholecystic fluid. Pancreas: Unremarkable. No pancreatic ductal dilatation or surrounding inflammatory changes. Spleen: Normal in size without focal abnormality. Adrenals/Urinary Tract: The adrenal glands are unremarkable. There is no hydronephrosis on either side. There is symmetric enhancement and excretion of contrast by both kidneys. Small foci of high attenuation within the renal collecting system may represent contrast versus small nonobstructing calculi. The visualized ureters are unremarkable. There is a 2 mm stone along the left posterior bladder wall, likely the recently passed stone. The urinary bladder is otherwise unremarkable. Stomach/Bowel: There is sigmoid diverticulosis without active  inflammatory changes. There is no bowel obstruction or active inflammation. The appendix is not visualized with certainty. No inflammatory changes identified in the right lower quadrant. Vascular/Lymphatic: Moderate aortoiliac atherosclerotic disease. The IVC is unremarkable. No portal venous gas. There is no adenopathy. Reproductive: The prostate and seminal vesicles are grossly unremarkable. No pelvic mass. Other: None Musculoskeletal: Osteopenia with degenerative changes of the spine. Total right hip arthroplasty. No acute osseous pathology. Review of the MIP images confirms the above findings. IMPRESSION: 1. No acute intrathoracic, abdominal, or pelvic pathology. No pulmonary artery embolus identified. 2. Bilateral lower lobe atelectasis/scarring. Developing infiltrate or aspiration, predominantly in the left lower lobe, is not excluded. Clinical correlation is recommended. 3. Probable recently passed 2 mm stone within the urinary bladder. No hydronephrosis. 4. Sigmoid diverticulosis. No bowel obstruction. Electronically Signed   By: Anner Crete M.D.   On: 05/29/2022 14:29   DG Chest 1 View  Result Date: 05/29/2022 CLINICAL DATA:  Fall EXAM: CHEST  1 VIEW COMPARISON:  CXR 09/26/19 FINDINGS: No pleural effusion. No pneumothorax. The left costophrenic angle is excluded from the field of view. Unchanged cardiac and mediastinal contours. No radiographically apparent displaced rib fractures. Visualized upper abdomen is unremarkable. IMPRESSION: No radiographically apparent displaced rib fractures. Please note the left costophrenic angle is excluded from the field of view. Electronically Signed   By: Marin Roberts M.D.   On: 05/29/2022 10:45   DG Hip Unilat With Pelvis 2-3 Views Right  Result Date: 05/29/2022 CLINICAL DATA:  Right hip pain after fall today. EXAM: DG HIP (WITH OR WITHOUT PELVIS) 2-3V RIGHT COMPARISON:  December 07, 2014. FINDINGS: Status post right hip hemiarthroplasty. No fracture or dislocation  is noted. IMPRESSION: No acute abnormality seen. Electronically Signed   By: Marijo Conception M.D.   On: 05/29/2022 10:43   CT HEAD WO CONTRAST  Result Date: 05/29/2022 CLINICAL DATA:  Minor trauma. EXAM: CT HEAD WITHOUT CONTRAST CT CERVICAL SPINE WITHOUT CONTRAST TECHNIQUE: Multidetector CT imaging of the head and cervical spine was performed following the standard protocol without intravenous contrast. Multiplanar CT image reconstructions of the cervical spine were also generated. RADIATION DOSE REDUCTION: This exam was performed according to the departmental dose-optimization program which includes automated exposure control, adjustment of the mA and/or kV according to patient size and/or use of iterative reconstruction technique. COMPARISON:  01/08/2022 FINDINGS: CT HEAD FINDINGS Brain: Ventricles, cisterns and other CSF spaces are within normal. There is moderate chronic ischemic microvascular disease. Old left basal ganglia lacunar infarct. No mass, mass effect, shift of midline structures or acute hemorrhage. No evidence of acute infarction. Vascular: No hyperdense vessel or unexpected calcification. Skull: Normal. Negative for fracture or focal lesion. Sinuses/Orbits: No acute finding. Other: None. CT CERVICAL SPINE FINDINGS Alignment: Normal. Skull base and vertebrae: Vertebral body heights are normal. There  is moderate spondylosis throughout the cervical spine to include uncovertebral joint spurring and facet arthropathy. Atlantoaxial articulation is normal. No acute fracture. Mild left-sided neural foraminal narrowing at the C3-4 level with mild bilateral neural foraminal narrowing at the C4-5 level. Moderate to severe bilateral neural foraminal narrowing at the C6-7 level greater than the C5-6 level. Soft tissues and spinal canal: Prevertebral soft tissues are normal. Minimal narrowing of the AP diameter of the spinal canal at the C5-6 level. Disc levels: Disc space narrowing from the C3-4 level to the  C7-T1 level. Upper chest: No acute findings. Other: None. IMPRESSION: 1. No acute brain injury. 2. Moderate chronic ischemic microvascular disease. Old left basal ganglia lacunar infarct. 3. No acute cervical spine injury. 4. Moderate spondylosis throughout the cervical spine with disc disease from the C3-4 level to the C7-T1 level. Multilevel neural foraminal narrowing as described. Minimal narrowing of the AP diameter of the spinal canal at the C5-6 level. Electronically Signed   By: Marin Olp M.D.   On: 05/29/2022 09:34   CT CERVICAL SPINE WO CONTRAST  Result Date: 05/29/2022 CLINICAL DATA:  Minor trauma. EXAM: CT HEAD WITHOUT CONTRAST CT CERVICAL SPINE WITHOUT CONTRAST TECHNIQUE: Multidetector CT imaging of the head and cervical spine was performed following the standard protocol without intravenous contrast. Multiplanar CT image reconstructions of the cervical spine were also generated. RADIATION DOSE REDUCTION: This exam was performed according to the departmental dose-optimization program which includes automated exposure control, adjustment of the mA and/or kV according to patient size and/or use of iterative reconstruction technique. COMPARISON:  01/08/2022 FINDINGS: CT HEAD FINDINGS Brain: Ventricles, cisterns and other CSF spaces are within normal. There is moderate chronic ischemic microvascular disease. Old left basal ganglia lacunar infarct. No mass, mass effect, shift of midline structures or acute hemorrhage. No evidence of acute infarction. Vascular: No hyperdense vessel or unexpected calcification. Skull: Normal. Negative for fracture or focal lesion. Sinuses/Orbits: No acute finding. Other: None. CT CERVICAL SPINE FINDINGS Alignment: Normal. Skull base and vertebrae: Vertebral body heights are normal. There is moderate spondylosis throughout the cervical spine to include uncovertebral joint spurring and facet arthropathy. Atlantoaxial articulation is normal. No acute fracture. Mild  left-sided neural foraminal narrowing at the C3-4 level with mild bilateral neural foraminal narrowing at the C4-5 level. Moderate to severe bilateral neural foraminal narrowing at the C6-7 level greater than the C5-6 level. Soft tissues and spinal canal: Prevertebral soft tissues are normal. Minimal narrowing of the AP diameter of the spinal canal at the C5-6 level. Disc levels: Disc space narrowing from the C3-4 level to the C7-T1 level. Upper chest: No acute findings. Other: None. IMPRESSION: 1. No acute brain injury. 2. Moderate chronic ischemic microvascular disease. Old left basal ganglia lacunar infarct. 3. No acute cervical spine injury. 4. Moderate spondylosis throughout the cervical spine with disc disease from the C3-4 level to the C7-T1 level. Multilevel neural foraminal narrowing as described. Minimal narrowing of the AP diameter of the spinal canal at the C5-6 level. Electronically Signed   By: Marin Olp M.D.   On: 05/29/2022 09:34    Pending Labs Unresulted Labs (From admission, onward)     Start     Ordered   05/29/22 1159  Blood Culture (routine x 2)  (Undifferentiated presentation (screening labs and basic nursing orders))  BLOOD CULTURE X 2,   STAT      05/29/22 1200   05/29/22 0905  Protime-INR  Once,   STAT  05/29/22 0905            Vitals/Pain Today's Vitals   05/29/22 2300 05/29/22 2330 05/30/22 0000 05/30/22 0030  BP: 108/60 (!) 118/52 121/68 (!) 154/81  Pulse: 79 93 81 87  Resp: '18 18 18 18  '$ Temp:      TempSrc:      SpO2: 98% 99% 99% 96%  PainSc:        Isolation Precautions No active isolations  Medications Medications  fentaNYL (SUBLIMAZE) injection 25 mcg (25 mcg Intravenous Given 05/29/22 1319)  lactated ringers bolus 500 mL (0 mLs Intravenous Stopped 05/29/22 1246)  fentaNYL (SUBLIMAZE) injection 50 mcg (50 mcg Intravenous Given 05/29/22 1229)  iohexol (OMNIPAQUE) 350 MG/ML injection 75 mL (75 mLs Intravenous Contrast Given 05/29/22 1350)   cefTRIAXone (ROCEPHIN) 1 g in sodium chloride 0.9 % 100 mL IVPB (0 g Intravenous Stopped 05/29/22 2316)  azithromycin (ZITHROMAX) 500 mg in sodium chloride 0.9 % 250 mL IVPB (0 mg Intravenous Stopped 05/30/22 0022)    Mobility non-ambulatory High fall risk   Focused Assessments     R Recommendations: See Admitting Provider Note  Report given to:   Additional Notes: pt is on 2lpm Speculator / has mitts on / granddaughter at bedside

## 2022-05-30 NOTE — Telephone Encounter (Signed)
Called number again, wrong number.

## 2022-05-30 NOTE — H&P (Addendum)
History and Physical    Prem Richard Kent SEG:[id number] DOB: 04/03/1929 DOA: 05/29/2022  PCP: Richard Lung, MD  Patient coming from: Home  Chief Complaint: Fall  HPI: Richard Kent is a 89 y.o. male with medical history significant of CAD, hypertension, hyperlipidemia, nephrolithiasis, type 2 diabetes, paroxysmal A-fib on Eliquis, dementia, bilateral lower extremity lymphedema presented to the ED with abdominal/right groin pain following an unwitnessed fall out of his bed earlier yesterday morning.  Patient is not able to give any history.  Granddaughter states patient has dementia and yesterday morning his wife found him on the floor next to their bed.  He was conscious at that time.  He has had a cough.  No episodes of vomiting reported.  Patient has bilateral lower extremity lymphedema and venous stasis dermatitis for which he is followed by Dr. Sharol Kent, the last clinic visit was a week ago.  ED Course: Oxygen saturation 84-86% on room air, placed on 3 L O2.  Afebrile.  Labs significant for WBC 11.3, hemoglobin 13.4, glucose 114, creatinine 0.8, normal LFTs, lactic acid normal, UA not suggestive of infection, blood cultures drawn.  CTA chest negative for PE but showing developing infiltrate or aspiration in the left lower lobe.  CT abdomen pelvis showing probable recently passed 2 mm stone within the urinary bladder and no hydronephrosis.  CT head/C-spine/T-spine/L-spine, x-ray of right hip/pelvis negative for acute traumatic injury. Patient was Kent fentanyl, ceftriaxone, azithromycin, and 500 cc LR bolus.  Review of Systems:  Review of Systems  All other systems reviewed and are negative.   Past Medical History:  Diagnosis Date   Carotid stenosis    CVA (cerebral infarction) 2011   Diverticulosis    Hyperlipidemia    Hypertension    Kidney stone    Smoker    Stroke ([place]) 11/17/09    Past Surgical History:  Procedure Laterality Date   APPENDECTOMY     HERNIA REPAIR     RIGHT    HIP ARTHROPLASTY  01/15/2012   Procedure: ARTHROPLASTY BIPOLAR HIP;  Surgeon: Richard Box, MD;  Location: [facility];  Service: Orthopedics;  Laterality: Right;     reports that he quit smoking about 50 years ago. His smoking use included cigarettes. He has a 40.00 pack-year smoking history. He has never used smokeless tobacco. He reports that he does not drink alcohol and does not use drugs.  No Known Allergies  Family History  Problem Relation Age of Onset   Cancer Mother    Stroke Mother    Heart disease Father    Cervical cancer Sister    Heart attack Sister     Prior to Admission medications   Medication Sig Start Date End Date Taking? Authorizing Provider  acetaminophen (TYLENOL) 500 MG tablet Take 500 mg by mouth every 6 (six) hours as needed for moderate pain.   Yes [provider]  amLODipine (NORVASC) 2.5 MG tablet Take 1 tablet (2.5 mg total) by mouth daily. 09/19/21  Yes Richard Lung, MD  amoxicillin-clavulanate (AUGMENTIN) 875-125 MG tablet Take 1 tablet by mouth 2 (two) times daily. 05/19/22  Yes Richard Lung, MD  apixaban (ELIQUIS) 5 MG TABS tablet Take 1 tablet (5 mg total) by mouth 2 (two) times daily. 09/19/21  Yes Richard Lung, MD  Ascorbic Acid (VITAMIN C PO) Take by mouth.   Yes [provider]  furosemide (LASIX) 20 MG tablet Take 1/2 (one-half) tablet by mouth once daily Patient taking differently: Take 10 mg by  mouth daily. 03/03/22  Yes Richard Lung, MD  gabapentin (NEURONTIN) 400 MG capsule Take 1 capsule (400 mg total) by mouth 2 (two) times daily. 05/02/22  Yes Richard Lung, MD  metoprolol succinate (TOPROL XL) 25 MG 24 hr tablet Take 0.5 tablets (12.5 mg total) by mouth daily. Hold if systolic blood pressure (top blood pressure number) less than 100 mmHg or heart rate less than 60 bpm (pulse). 08/30/21 05/31/23 Yes Kent, Sunit, DO  Multiple Vitamin (MULTIVITAMIN WITH MINERALS) TABS Take 1 tablet by mouth every morning. Reported on  09/06/2015   Yes [provider]  simvastatin (ZOCOR) 20 MG tablet Take 1 tablet (20 mg total) by mouth every evening. 09/19/21  Yes Richard Lung, MD  traMADol (ULTRAM) 50 MG tablet Take 2 tablets (100 mg total) by mouth every 8 (eight) hours as needed. 05/26/22 06/25/22 Yes Richard Lung, MD  VITAMIN D PO Take 1 tablet by mouth daily.   Yes [provider]  Blood Glucose Monitoring Suppl (ONE TOUCH ULTRA 2) w/Device KIT 1 each by Does not apply route daily. 01/18/22   Richard Lung, MD  glucose blood [facility] ULTRA) test strip Check blood sugar once daily 03/15/22   Richard Lung, MD  mupirocin ointment (BACTROBAN) 2 % Apply 1 Application topically 2 (two) times daily. 02/01/22   Richard Lung, MD  OneTouch Delica Lancets 97D MISC 1 each by Does not apply route daily. 04/12/22   Richard Lung, MD    Physical Exam: Vitals:   05/29/22 2300 05/29/22 2330 05/30/22 0000 05/30/22 0030  BP: 108/60 (!) 118/52 121/68 (!) 154/81  Pulse: 79 93 81 87  Resp: '18 18 18 18  '$ Temp:      TempSrc:      SpO2: 98% 99% 99% 96%    Physical Exam Vitals reviewed.  Constitutional:      General: He is not in acute distress. HENT:     Head: Normocephalic and atraumatic.  Cardiovascular:     Rate and Rhythm: Normal rate and regular rhythm.     Pulses: Normal pulses.  Pulmonary:     Effort: Pulmonary effort is normal. No respiratory distress.     Breath sounds: No wheezing.     Comments: Crackles appreciated at the left Kent base Abdominal:     General: Bowel sounds are normal. There is no distension.     Palpations: Abdomen is soft.     Tenderness: There is no abdominal tenderness.  Musculoskeletal:     Cervical back: Normal range of motion.  Skin:    General: Skin is warm and dry.  Neurological:     General: No focal deficit present.     Labs on Admission: I have personally reviewed following labs and imaging studies  CBC: Recent Labs  Lab 05/29/22 0902  WBC 11.3*   NEUTROABS 8.6*  HGB 13.4  HCT 40.2  MCV 98.8  PLT 532   Basic Metabolic Panel: Recent Labs  Lab 05/29/22 0902  NA 137  K 4.2  CL 103  CO2 23  GLUCOSE 114*  BUN 16  CREATININE 0.86  CALCIUM 8.6*  MG 2.1   GFR: CrCl cannot be calculated (Unknown ideal weight.). Liver Function Tests: Recent Labs  Lab 05/29/22 0902  AST 36  ALT 28  ALKPHOS 66  BILITOT 0.9  PROT 6.6  ALBUMIN 3.2*   No results for input(s): "LIPASE", "AMYLASE" in the last 168 hours. No results for input(s): "AMMONIA" in the  last 168 hours. Coagulation Profile: No results for input(s): "INR", "PROTIME" in the last 168 hours. Cardiac Enzymes: No results for input(s): "CKTOTAL", "CKMB", "CKMBINDEX", "TROPONINI" in the last 168 hours. BNP (last 3 results) No results for input(s): "PROBNP" in the last 8760 hours. HbA1C: No results for input(s): "HGBA1C" in the last 72 hours. CBG: No results for input(s): "GLUCAP" in the last 168 hours. Lipid Profile: No results for input(s): "CHOL", "HDL", "LDLCALC", "TRIG", "CHOLHDL", "LDLDIRECT" in the last 72 hours. Thyroid Function Tests: No results for input(s): "TSH", "T4TOTAL", "FREET4", "T3FREE", "THYROIDAB" in the last 72 hours. Anemia Panel: No results for input(s): "VITAMINB12", "FOLATE", "FERRITIN", "TIBC", "IRON", "RETICCTPCT" in the last 72 hours. Urine analysis:    Component Value Date/Time   COLORURINE YELLOW 05/29/2022 [place] 05/29/2022 1549   LABSPEC 1.025 05/29/2022 1549   PHURINE 6.0 05/29/2022 1549   GLUCOSEU NEGATIVE 05/29/2022 1549   HGBUR NEGATIVE 05/29/2022 1549   BILIRUBINUR NEGATIVE 05/29/2022 1549   KETONESUR 5 (A) 05/29/2022 1549   PROTEINUR NEGATIVE 05/29/2022 1549   UROBILINOGEN 1.0 01/13/2012 1638   NITRITE NEGATIVE 05/29/2022 1549   LEUKOCYTESUR NEGATIVE 05/29/2022 1549    Radiological Exams on Admission: CT T-SPINE NO CHARGE  Result Date: 05/29/2022 CLINICAL DATA:  Trauma. EXAM: CT THORACIC AND LUMBAR  SPINE WITHOUT CONTRAST TECHNIQUE: Multidetector CT imaging of the thoracic and lumbar spine was performed without contrast. Multiplanar CT image reconstructions were also generated. RADIATION DOSE REDUCTION: This exam was performed according to the departmental dose-optimization program which includes automated exposure control, adjustment of the mA and/or kV according to patient size and/or use of iterative reconstruction technique. COMPARISON:  CT of the chest abdomen pelvis dated 05/29/2022. FINDINGS: CT THORACIC SPINE FINDINGS Alignment: No acute subluxation. Vertebrae: No acute fracture.  Osteopenia. Paraspinal and other soft tissues: No acute findings. No perispinal fluid collection or hematoma. Disc levels: No acute findings.  Degenerative changes. CT LUMBAR SPINE FINDINGS Segmentation: 5 lumbar type vertebrae. Alignment: No acute subluxation. Vertebrae: No acute fracture.  Osteopenia. Paraspinal and other soft tissues: No acute findings. Disc levels: No acute findings. Multilevel degenerative changes with disc desiccation and vacuum phenomena. IMPRESSION: No acute/traumatic thoracic or lumbar spine pathology. Electronically Signed   By: Anner Crete M.D.   On: 05/29/2022 14:38   CT L-SPINE NO CHARGE  Result Date: 05/29/2022 CLINICAL DATA:  Trauma. EXAM: CT THORACIC AND LUMBAR SPINE WITHOUT CONTRAST TECHNIQUE: Multidetector CT imaging of the thoracic and lumbar spine was performed without contrast. Multiplanar CT image reconstructions were also generated. RADIATION DOSE REDUCTION: This exam was performed according to the departmental dose-optimization program which includes automated exposure control, adjustment of the mA and/or kV according to patient size and/or use of iterative reconstruction technique. COMPARISON:  CT of the chest abdomen pelvis dated 05/29/2022. FINDINGS: CT THORACIC SPINE FINDINGS Alignment: No acute subluxation. Vertebrae: No acute fracture.  Osteopenia. Paraspinal and other  soft tissues: No acute findings. No perispinal fluid collection or hematoma. Disc levels: No acute findings.  Degenerative changes. CT LUMBAR SPINE FINDINGS Segmentation: 5 lumbar type vertebrae. Alignment: No acute subluxation. Vertebrae: No acute fracture.  Osteopenia. Paraspinal and other soft tissues: No acute findings. Disc levels: No acute findings. Multilevel degenerative changes with disc desiccation and vacuum phenomena. IMPRESSION: No acute/traumatic thoracic or lumbar spine pathology. Electronically Signed   By: Anner Crete M.D.   On: 05/29/2022 14:38   CT Angio Chest PE W and/or Wo Contrast  Result Date: 05/29/2022 CLINICAL DATA:  Concern for pulmonary  embolism.  Blunt trauma. EXAM: CT ANGIOGRAPHY CHEST CT ABDOMEN AND PELVIS WITH CONTRAST TECHNIQUE: Multidetector CT imaging of the chest was performed using the standard protocol during bolus administration of intravenous contrast. Multiplanar CT image reconstructions and MIPs were obtained to evaluate the vascular anatomy. Multidetector CT imaging of the abdomen and pelvis was performed using the standard protocol during bolus administration of intravenous contrast. RADIATION DOSE REDUCTION: This exam was performed according to the departmental dose-optimization program which includes automated exposure control, adjustment of the mA and/or kV according to patient size and/or use of iterative reconstruction technique. CONTRAST:  1m OMNIPAQUE IOHEXOL 350 MG/ML SOLN COMPARISON:  Chest radiograph dated 05/29/2022. FINDINGS: CTA CHEST FINDINGS Cardiovascular: There is mild cardiomegaly. No pericardial effusion. Three-vessel coronary vascular calcification. There is moderate atherosclerotic calcification of the thoracic aorta. The aortic isthmus measures approximately 3.5 cm in diameter. No aortic dissection. The origins of the great vessels of the aortic arch appear patent as visualized. Evaluation of the pulmonary arteries is limited due to  respiratory motion and streak artifact caused by patient's arms. No pulmonary artery embolus identified. Mediastinum/Nodes: No obvious hilar adenopathy. Mildly enlarged rounded subcarinal lymph node measures 11 mm. The esophagus is grossly unremarkable. No mediastinal fluid collection. Lungs/Pleura: Bilateral lower lobe streaky and bandlike densities and areas of airspace disease primarily involving the left lower lobe may represent atelectasis/scarring. Aspiration or developing infiltrate is not entirely excluded. Clinical correlation is recommended. Bilateral, left greater right, perihilar and peribronchial thickening. There is no pleural effusion or pneumothorax. The central airways remain patent. Musculoskeletal: Degenerative changes of the spine. No acute osseous pathology. No displaced rib fractures. Evaluation however is limited due to osteopenia and streak artifact caused by patient's arms. Review of the MIP images confirms the above findings. CT ABDOMEN and PELVIS FINDINGS No intra-abdominal free air or free fluid. Hepatobiliary: The liver is grossly unremarkable. No biliary dilatation. No calcified gallstone or pericholecystic fluid. Pancreas: Unremarkable. No pancreatic ductal dilatation or surrounding inflammatory changes. Spleen: Normal in size without focal abnormality. Adrenals/Urinary Tract: The adrenal glands are unremarkable. There is no hydronephrosis on either side. There is symmetric enhancement and excretion of contrast by both kidneys. Small foci of high attenuation within the renal collecting system may represent contrast versus small nonobstructing calculi. The visualized ureters are unremarkable. There is a 2 mm stone along the left posterior bladder wall, likely the recently passed stone. The urinary bladder is otherwise unremarkable. Stomach/Bowel: There is sigmoid diverticulosis without active inflammatory changes. There is no bowel obstruction or active inflammation. The appendix is not  visualized with certainty. No inflammatory changes identified in the right lower quadrant. Vascular/Lymphatic: Moderate aortoiliac atherosclerotic disease. The IVC is unremarkable. No portal venous gas. There is no adenopathy. Reproductive: The prostate and seminal vesicles are grossly unremarkable. No pelvic mass. Other: None Musculoskeletal: Osteopenia with degenerative changes of the spine. Total right hip arthroplasty. No acute osseous pathology. Review of the MIP images confirms the above findings. IMPRESSION: 1. No acute intrathoracic, abdominal, or pelvic pathology. No pulmonary artery embolus identified. 2. Bilateral lower lobe atelectasis/scarring. Developing infiltrate or aspiration, predominantly in the left lower lobe, is not excluded. Clinical correlation is recommended. 3. Probable recently passed 2 mm stone within the urinary bladder. No hydronephrosis. 4. Sigmoid diverticulosis. No bowel obstruction. Electronically Signed   By: AAnner CreteM.D.   On: 05/29/2022 14:29   CT ABDOMEN PELVIS W CONTRAST  Result Date: 05/29/2022 CLINICAL DATA:  Concern for pulmonary embolism.  Blunt trauma. EXAM: CT ANGIOGRAPHY  CHEST CT ABDOMEN AND PELVIS WITH CONTRAST TECHNIQUE: Multidetector CT imaging of the chest was performed using the standard protocol during bolus administration of intravenous contrast. Multiplanar CT image reconstructions and MIPs were obtained to evaluate the vascular anatomy. Multidetector CT imaging of the abdomen and pelvis was performed using the standard protocol during bolus administration of intravenous contrast. RADIATION DOSE REDUCTION: This exam was performed according to the departmental dose-optimization program which includes automated exposure control, adjustment of the mA and/or kV according to patient size and/or use of iterative reconstruction technique. CONTRAST:  63m OMNIPAQUE IOHEXOL 350 MG/ML SOLN COMPARISON:  Chest radiograph dated 05/29/2022. FINDINGS: CTA CHEST  FINDINGS Cardiovascular: There is mild cardiomegaly. No pericardial effusion. Three-vessel coronary vascular calcification. There is moderate atherosclerotic calcification of the thoracic aorta. The aortic isthmus measures approximately 3.5 cm in diameter. No aortic dissection. The origins of the great vessels of the aortic arch appear patent as visualized. Evaluation of the pulmonary arteries is limited due to respiratory motion and streak artifact caused by patient's arms. No pulmonary artery embolus identified. Mediastinum/Nodes: No obvious hilar adenopathy. Mildly enlarged rounded subcarinal lymph node measures 11 mm. The esophagus is grossly unremarkable. No mediastinal fluid collection. Lungs/Pleura: Bilateral lower lobe streaky and bandlike densities and areas of airspace disease primarily involving the left lower lobe may represent atelectasis/scarring. Aspiration or developing infiltrate is not entirely excluded. Clinical correlation is recommended. Bilateral, left greater right, perihilar and peribronchial thickening. There is no pleural effusion or pneumothorax. The central airways remain patent. Musculoskeletal: Degenerative changes of the spine. No acute osseous pathology. No displaced rib fractures. Evaluation however is limited due to osteopenia and streak artifact caused by patient's arms. Review of the MIP images confirms the above findings. CT ABDOMEN and PELVIS FINDINGS No intra-abdominal free air or free fluid. Hepatobiliary: The liver is grossly unremarkable. No biliary dilatation. No calcified gallstone or pericholecystic fluid. Pancreas: Unremarkable. No pancreatic ductal dilatation or surrounding inflammatory changes. Spleen: Normal in size without focal abnormality. Adrenals/Urinary Tract: The adrenal glands are unremarkable. There is no hydronephrosis on either side. There is symmetric enhancement and excretion of contrast by both kidneys. Small foci of high attenuation within the renal  collecting system may represent contrast versus small nonobstructing calculi. The visualized ureters are unremarkable. There is a 2 mm stone along the left posterior bladder wall, likely the recently passed stone. The urinary bladder is otherwise unremarkable. Stomach/Bowel: There is sigmoid diverticulosis without active inflammatory changes. There is no bowel obstruction or active inflammation. The appendix is not visualized with certainty. No inflammatory changes identified in the right lower quadrant. Vascular/Lymphatic: Moderate aortoiliac atherosclerotic disease. The IVC is unremarkable. No portal venous gas. There is no adenopathy. Reproductive: The prostate and seminal vesicles are grossly unremarkable. No pelvic mass. Other: None Musculoskeletal: Osteopenia with degenerative changes of the spine. Total right hip arthroplasty. No acute osseous pathology. Review of the MIP images confirms the above findings. IMPRESSION: 1. No acute intrathoracic, abdominal, or pelvic pathology. No pulmonary artery embolus identified. 2. Bilateral lower lobe atelectasis/scarring. Developing infiltrate or aspiration, predominantly in the left lower lobe, is not excluded. Clinical correlation is recommended. 3. Probable recently passed 2 mm stone within the urinary bladder. No hydronephrosis. 4. Sigmoid diverticulosis. No bowel obstruction. Electronically Signed   By: AAnner CreteM.D.   On: 05/29/2022 14:29   DG Chest 1 View  Result Date: 05/29/2022 CLINICAL DATA:  Fall EXAM: CHEST  1 VIEW COMPARISON:  CXR 09/26/19 FINDINGS: No pleural effusion. No pneumothorax. The left  costophrenic angle is excluded from the field of view. Unchanged cardiac and mediastinal contours. No radiographically apparent displaced rib fractures. Visualized upper abdomen is unremarkable. IMPRESSION: No radiographically apparent displaced rib fractures. Please note the left costophrenic angle is excluded from the field of view. Electronically Signed    By: Marin Roberts M.D.   On: 05/29/2022 10:45   DG Hip Unilat With Pelvis 2-3 Views Right  Result Date: 05/29/2022 CLINICAL DATA:  Right hip pain after fall today. EXAM: DG HIP (WITH OR WITHOUT PELVIS) 2-3V RIGHT COMPARISON:  December 07, 2014. FINDINGS: Status post right hip hemiarthroplasty. No fracture or dislocation is noted. IMPRESSION: No acute abnormality seen. Electronically Signed   By: Marijo Conception M.D.   On: 05/29/2022 10:43   CT HEAD WO CONTRAST  Result Date: 05/29/2022 CLINICAL DATA:  Minor trauma. EXAM: CT HEAD WITHOUT CONTRAST CT CERVICAL SPINE WITHOUT CONTRAST TECHNIQUE: Multidetector CT imaging of the head and cervical spine was performed following the standard protocol without intravenous contrast. Multiplanar CT image reconstructions of the cervical spine were also generated. RADIATION DOSE REDUCTION: This exam was performed according to the departmental dose-optimization program which includes automated exposure control, adjustment of the mA and/or kV according to patient size and/or use of iterative reconstruction technique. COMPARISON:  01/08/2022 FINDINGS: CT HEAD FINDINGS Brain: Ventricles, cisterns and other CSF spaces are within normal. There is moderate chronic ischemic microvascular disease. Old left basal ganglia lacunar infarct. No mass, mass effect, shift of midline structures or acute hemorrhage. No evidence of acute infarction. Vascular: No hyperdense vessel or unexpected calcification. Skull: Normal. Negative for fracture or focal lesion. Sinuses/Orbits: No acute finding. Other: None. CT CERVICAL SPINE FINDINGS Alignment: Normal. Skull base and vertebrae: Vertebral body heights are normal. There is moderate spondylosis throughout the cervical spine to include uncovertebral joint spurring and facet arthropathy. Atlantoaxial articulation is normal. No acute fracture. Mild left-sided neural foraminal narrowing at the C3-4 level with mild bilateral neural foraminal narrowing at  the C4-5 level. Moderate to severe bilateral neural foraminal narrowing at the C6-7 level greater than the C5-6 level. Soft tissues and spinal canal: Prevertebral soft tissues are normal. Minimal narrowing of the AP diameter of the spinal canal at the C5-6 level. Disc levels: Disc space narrowing from the C3-4 level to the C7-T1 level. Upper chest: No acute findings. Other: None. IMPRESSION: 1. No acute brain injury. 2. Moderate chronic ischemic microvascular disease. Old left basal ganglia lacunar infarct. 3. No acute cervical spine injury. 4. Moderate spondylosis throughout the cervical spine with disc disease from the C3-4 level to the C7-T1 level. Multilevel neural foraminal narrowing as described. Minimal narrowing of the AP diameter of the spinal canal at the C5-6 level. Electronically Signed   By: Marin Olp M.D.   On: 05/29/2022 09:34   CT CERVICAL SPINE WO CONTRAST  Result Date: 05/29/2022 CLINICAL DATA:  Minor trauma. EXAM: CT HEAD WITHOUT CONTRAST CT CERVICAL SPINE WITHOUT CONTRAST TECHNIQUE: Multidetector CT imaging of the head and cervical spine was performed following the standard protocol without intravenous contrast. Multiplanar CT image reconstructions of the cervical spine were also generated. RADIATION DOSE REDUCTION: This exam was performed according to the departmental dose-optimization program which includes automated exposure control, adjustment of the mA and/or kV according to patient size and/or use of iterative reconstruction technique. COMPARISON:  01/08/2022 FINDINGS: CT HEAD FINDINGS Brain: Ventricles, cisterns and other CSF spaces are within normal. There is moderate chronic ischemic microvascular disease. Old left basal ganglia lacunar infarct. No  mass, mass effect, shift of midline structures or acute hemorrhage. No evidence of acute infarction. Vascular: No hyperdense vessel or unexpected calcification. Skull: Normal. Negative for fracture or focal lesion. Sinuses/Orbits: No  acute finding. Other: None. CT CERVICAL SPINE FINDINGS Alignment: Normal. Skull base and vertebrae: Vertebral body heights are normal. There is moderate spondylosis throughout the cervical spine to include uncovertebral joint spurring and facet arthropathy. Atlantoaxial articulation is normal. No acute fracture. Mild left-sided neural foraminal narrowing at the C3-4 level with mild bilateral neural foraminal narrowing at the C4-5 level. Moderate to severe bilateral neural foraminal narrowing at the C6-7 level greater than the C5-6 level. Soft tissues and spinal canal: Prevertebral soft tissues are normal. Minimal narrowing of the AP diameter of the spinal canal at the C5-6 level. Disc levels: Disc space narrowing from the C3-4 level to the C7-T1 level. Upper chest: No acute findings. Other: None. IMPRESSION: 1. No acute brain injury. 2. Moderate chronic ischemic microvascular disease. Old left basal ganglia lacunar infarct. 3. No acute cervical spine injury. 4. Moderate spondylosis throughout the cervical spine with disc disease from the C3-4 level to the C7-T1 level. Multilevel neural foraminal narrowing as described. Minimal narrowing of the AP diameter of the spinal canal at the C5-6 level. Electronically Signed   By: Marin Olp M.D.   On: 05/29/2022 09:34    EKG: Independently reviewed.  Rate controlled A-fib, RBBB.  No significant change since prior tracing.  Assessment and Plan  Acute hypoxemic respiratory failure secondary to COVID-19 infection and left lower lobe pneumonia Oxygen saturation in the mid 80s on room air, currently stable on 2 L.  CT showing developing infiltrate or aspiration in the left lower lobe.  Has borderline leukocytosis and no signs of sepsis.  He was started on Unasyn for possible aspiration pneumonia.  SARS-CoV-2 PCR later came back positive. -Remdesivir -IV Decadron 6 mg daily -On Unasyn, check procalcitonin level -Keep n.p.o. at this time, aspiration precautions, SLP  eval -Check inflammatory markers including ferritin, fibrinogen, D-dimer, CRP, LDH -Airborne and contact precautions -Incentive spirometry, flutter valve -Encourage prone positioning -Monitor WBC count -Blood cultures pending -Continue supplemental oxygen, wean as tolerated  CAD EKG without acute ischemic changes and patient is not endorsing chest pain. -Continue metoprolol and Zocor when no longer NPO/passes swallow eval  Chronic bilateral lower extremity edema/lymphedema -Seen by Dr. Sharol Kent and last clinic visit was a week ago -He is on oral Lasix 10 mg daily, continue when no longer NPO/passes swallow eval  Hypertension Systolic currently in the 140s. -Continue home meds when no longer n.p.o./passes swallow eval -Monitor blood pressure  Type 2 diabetes with neuropathy A1c 6.3 on 05/19/2022. -Sensitive sliding scale insulin every 4 hours for now as patient is currently n.p.o. -Continue gabapentin when no longer n.p.o./passes swallow eval  A-fib Rate controlled. -Continue Eliquis and metoprolol when no longer n.p.o./passes swallow eval  Dementia -Fall and delirium precautions  Code Status: Unable to discuss CODE STATUS with the patient due to his dementia.  Patient's granddaughter was at bedside and called the patient's wife over the phone.  CODE STATUS was discussed with the patient's wife, he is DNR/DNI. Level of care: Telemetry bed Admission status: It is my clinical opinion that admission to INPATIENT is reasonable and necessary because of the expectation that this patient will require hospital care that crosses at least 2 midnights to treat this condition based on the medical complexity of the problems presented.  Kent the aforementioned information, the predictability of an adverse  outcome is felt to be significant.   Shela Leff MD [facility]  If 7PM-7AM, please contact night-coverage [email or web address]  05/30/2022, 2:13 AM

## 2022-05-30 NOTE — Progress Notes (Signed)
PROGRESS NOTE    [name]  HLK:[id number] DOB: 27-Dec-1928 DOA: 05/29/2022 PCP: Denita Lung, MD   Brief Narrative:  HPI: Richard Kent is a 89 y.o. male with medical history significant of CAD, hypertension, hyperlipidemia, nephrolithiasis, type 2 diabetes, paroxysmal A-fib on Eliquis, dementia, bilateral lower extremity lymphedema presented to the ED with abdominal/right groin pain following an unwitnessed fall out of his bed earlier yesterday morning.  Patient is not able to give any history.  Granddaughter states patient has dementia and yesterday morning his wife found him on the floor next to their bed.  He was conscious at that time.  He has had a cough.  No episodes of vomiting reported.  Patient has bilateral lower extremity lymphedema and venous stasis dermatitis for which he is followed by Dr. Sharol Given, the last clinic visit was a week ago.   ED Course: Oxygen saturation 84-86% on room air, placed on 3 L O2.  Afebrile.  Labs significant for WBC 11.3, hemoglobin 13.4, glucose 114, creatinine 0.8, normal LFTs, lactic acid normal, UA not suggestive of infection, blood cultures drawn.  CTA chest negative for PE but showing developing infiltrate or aspiration in the left lower lobe.  CT abdomen pelvis showing probable recently passed 2 mm stone within the urinary bladder and no hydronephrosis.  CT head/C-spine/T-spine/L-spine, x-ray of right hip/pelvis negative for acute traumatic injury. Patient was given fentanyl, ceftriaxone, azithromycin, and 500 cc LR bolus.  Assessment & Plan:   Principal Problem:   COVID-19 virus infection Active Problems:   Hyperlipidemia LDL goal <100   Hypertension   Type II diabetes mellitus with complication (HCC)   Atrial fibrillation (HCC)   Pneumonia   Acute hypoxemic respiratory failure (HCC)   CAD (coronary artery disease)  Severe sepsis and acute hypoxemic respiratory and severe sepsis failure secondary to COVID-19 infection and left lower lobe  pneumonia, POA: Oxygen saturation in the mid 80s on room air, currently stable on 2 L.  CT showing developing infiltrate or aspiration in the left lower lobe.  Has borderline leukocytosis which does not fit the criteria for sepsis however patient also had tachycardia and tachypnea so based on that, patient did meet criteria for sepsis and due to hypoxia, severe sepsis.  He was started on Unasyn which I will continue as well as remdesivir and dexamethasone which I will continue.  CTA negative for PE.  Patient was encouraged to prone, out of bed to chair, to use incentive spirometry and flutter valve.  I will consult SLP.  Fall: Mechanical fall, no prodromal symptoms.  Patient had extensive workup with several imaging studies and no fracture was found.  Will consult PT OT.   CAD EKG without acute ischemic changes and patient is not endorsing chest pain. -Continue metoprolol and Zocor.    Chronic bilateral lower extremity edema/lymphedema -Seen by Dr. Sharol Given and last clinic visit was a week ago -He is on oral Lasix 10 mg daily, continue when no longer NPO/passes swallow eval   Essential hypertension Blood pressure controlled.  Continue home medications.   Type 2 diabetes with neuropathy A1c 6.3 on 05/19/2022.  Diet controlled, continue SSI.   A-fib Rate controlled. -Continue Eliquis and metoprolol.    Dementia -Fall and delirium precautions  DVT prophylaxis: Eliquis   Code Status: DNR  Family Communication:  None present at bedside.  Plan of care discussed with patient in length and he/she verbalized understanding and agreed with it.  Discussed with and updated her daughter collan schoenfeld over the  phone.  Status is: Inpatient Remains inpatient appropriate because: Patient is still hypoxic   Estimated body mass index is 24.76 kg/m as calculated from the following:   Height as of this encounter: 6' (1.829 m).   Weight as of this encounter: 82.8 kg.    Nutritional Assessment: Body  mass index is 24.76 kg/m.Marland Kitchen Seen by dietician.  I agree with the assessment and plan as outlined below: Nutrition Status:        . Skin Assessment: I have examined the patient's skin and I agree with the wound assessment as performed by the wound care RN as outlined below:    Consultants:  None  Procedures:  None  Antimicrobials:  Anti-infectives (From admission, onward)    Start     Dose/Rate Route Frequency Ordered Stop   05/31/22 1000  remdesivir 100 mg in sodium chloride 0.9 % 100 mL IVPB       See Hyperspace for full Linked Orders Report.   100 mg 200 mL/hr over 30 Minutes Intravenous Daily 05/30/22 0807 06/02/22 0959   05/30/22 0900  remdesivir 200 mg in sodium chloride 0.9% 250 mL IVPB       See Hyperspace for full Linked Orders Report.   200 mg 580 mL/hr over 30 Minutes Intravenous Once 05/30/22 0807     05/30/22 0400  Ampicillin-Sulbactam (UNASYN) 3 g in sodium chloride 0.9 % 100 mL IVPB        3 g 200 mL/hr over 30 Minutes Intravenous Every 6 hours 05/30/22 0333     05/29/22 2230  cefTRIAXone (ROCEPHIN) 1 g in sodium chloride 0.9 % 100 mL IVPB        1 g 200 mL/hr over 30 Minutes Intravenous  Once 05/29/22 2227 05/29/22 2316   05/29/22 2230  azithromycin (ZITHROMAX) 500 mg in sodium chloride 0.9 % 250 mL IVPB        500 mg 250 mL/hr over 60 Minutes Intravenous  Once 05/29/22 2227 05/30/22 0022         Subjective: Patient seen and examined, patient appears to be stable and comfortable.  He was fully alert but not oriented, likely at his baseline.  Objective: Vitals:   05/30/22 0255 05/30/22 0300 05/30/22 0615 05/30/22 0913  BP: (!) 159/80  (!) 146/64 134/75  Pulse: 97  82 85  Resp: '20  18 18  '$ Temp: 98.3 F (36.8 C)  98.2 F (36.8 C) 98.6 F (37 C)  TempSrc: Oral  Oral Axillary  SpO2: 93%  94% 95%  Weight:  82.8 kg    Height:  6' (1.829 m)      Intake/Output Summary (Last 24 hours) at 05/30/2022 1105 Last data filed at 05/30/2022 0900 Gross per 24  hour  Intake 100 ml  Output --  Net 100 ml   Filed Weights   05/30/22 0300  Weight: 82.8 kg    Examination:  General exam: Appears calm and comfortable  Respiratory system: Clear to auscultation. Respiratory effort normal. Cardiovascular system: S1 & S2 heard, RRR. No JVD, murmurs, rubs, gallops or clicks. No pedal edema. Gastrointestinal system: Abdomen is nondistended, soft and nontender. No organomegaly or masses felt. Normal bowel sounds heard. Central nervous system: Alert and oriented x 1. No focal neurological deficits. Extremities: Symmetric 5 x 5 power. Skin: No rashes, lesions or ulcers  Data Reviewed: I have personally reviewed following labs and imaging studies  CBC: Recent Labs  Lab 05/29/22 0902 05/30/22 0732  WBC 11.3* 10.9*  NEUTROABS 8.6*  --  HGB 13.4 12.4*  HCT 40.2 37.5*  MCV 98.8 98.2  PLT 278 419   Basic Metabolic Panel: Recent Labs  Lab 05/29/22 0902  NA 137  K 4.2  CL 103  CO2 23  GLUCOSE 114*  BUN 16  CREATININE 0.86  CALCIUM 8.6*  MG 2.1   GFR: Estimated Creatinine Clearance: 61.4 mL/min (by C-G formula based on SCr of 0.86 mg/dL). Liver Function Tests: Recent Labs  Lab 05/29/22 0902  AST 36  ALT 28  ALKPHOS 66  BILITOT 0.9  PROT 6.6  ALBUMIN 3.2*   No results for input(s): "LIPASE", "AMYLASE" in the last 168 hours. No results for input(s): "AMMONIA" in the last 168 hours. Coagulation Profile: No results for input(s): "INR", "PROTIME" in the last 168 hours. Cardiac Enzymes: No results for input(s): "CKTOTAL", "CKMB", "CKMBINDEX", "TROPONINI" in the last 168 hours. BNP (last 3 results) No results for input(s): "PROBNP" in the last 8760 hours. HbA1C: No results for input(s): "HGBA1C" in the last 72 hours. CBG: Recent Labs  Lab 05/30/22 0248 05/30/22 0911  GLUCAP 109* 107*   Lipid Profile: No results for input(s): "CHOL", "HDL", "LDLCALC", "TRIG", "CHOLHDL", "LDLDIRECT" in the last 72 hours. Thyroid Function  Tests: No results for input(s): "TSH", "T4TOTAL", "FREET4", "T3FREE", "THYROIDAB" in the last 72 hours. Anemia Panel: Recent Labs    05/30/22 0820  FERRITIN 90   Sepsis Labs: Recent Labs  Lab 05/29/22 1250  LATICACIDVEN 1.9    Recent Results (from the past 240 hour(s))  Blood Culture (routine x 2)     Status: None (Preliminary result)   Collection Time: 05/29/22 12:35 PM   Specimen: BLOOD LEFT HAND  Result Value Ref Range Status   Specimen Description BLOOD LEFT HAND  Final   Special Requests   Final    BOTTLES DRAWN AEROBIC AND ANAEROBIC Blood Culture adequate volume   Culture   Final    NO GROWTH < 24 HOURS Performed at [facility], [place] [address], [place], [place] [address]    Report Status PENDING  Incomplete  Blood Culture (routine x 2)     Status: None (Preliminary result)   Collection Time: 05/29/22 12:42 PM   Specimen: BLOOD RIGHT HAND  Result Value Ref Range Status   Specimen Description BLOOD RIGHT HAND  Final   Special Requests   Final    BOTTLES DRAWN AEROBIC ONLY Blood Culture results may not be optimal due to an inadequate volume of blood received in culture bottles   Culture   Final    NO GROWTH < 24 HOURS Performed at [facility], [place] [address] [place], [place] [address]    Report Status PENDING  Incomplete  Resp panel by RT-PCR (RSV, Flu A&B, Covid) Anterior Nasal Swab     Status: Abnormal   Collection Time: 05/30/22  4:00 AM   Specimen: Anterior Nasal Swab  Result Value Ref Range Status   SARS Coronavirus 2 by RT PCR POSITIVE (A) NEGATIVE Final    Comment: (NOTE) SARS-CoV-2 target nucleic acids are DETECTED.  The SARS-CoV-2 RNA is generally detectable in upper respiratory specimens during the acute phase of infection. Positive results are indicative of the presence of the identified virus, but do not rule out bacterial infection or co-infection with other pathogens not detected by the test. Clinical correlation with patient history  and other diagnostic information is necessary to determine patient infection status. The expected result is Negative.  Fact Sheet for Patients: EntrepreneurPulse.com.au  Fact Sheet  for Healthcare Providers: IncredibleEmployment.be  This test is not yet approved or cleared by the [place] and  has been authorized for detection and/or diagnosis of SARS-CoV-2 by FDA under an Emergency Use Authorization (EUA).  This EUA will remain in effect (meaning this test can be used) for the duration of  the COVID-19 declaration under Section 564(b)(1) of the A ct, 21 U.S.C. section 360bbb-3(b)(1), unless the authorization is terminated or revoked sooner.     Influenza A by PCR NEGATIVE NEGATIVE Final   Influenza B by PCR NEGATIVE NEGATIVE Final    Comment: (NOTE) The Xpert Xpress SARS-CoV-2/FLU/RSV plus assay is intended as an aid in the diagnosis of influenza from Nasopharyngeal swab specimens and should not be used as a sole basis for treatment. Nasal washings and aspirates are unacceptable for Xpert Xpress SARS-CoV-2/FLU/RSV testing.  Fact Sheet for Patients: EntrepreneurPulse.com.au  Fact Sheet for Healthcare Providers: IncredibleEmployment.be  This test is not yet approved or cleared by the [place] FDA and has been authorized for detection and/or diagnosis of SARS-CoV-2 by FDA under an Emergency Use Authorization (EUA). This EUA will remain in effect (meaning this test can be used) for the duration of the COVID-19 declaration under Section 564(b)(1) of the Act, 21 U.S.C. section 360bbb-3(b)(1), unless the authorization is terminated or revoked.     Resp Syncytial Virus by PCR NEGATIVE NEGATIVE Final    Comment: (NOTE) Fact Sheet for Patients: EntrepreneurPulse.com.au  Fact Sheet for Healthcare Providers: IncredibleEmployment.be  This test is not yet  approved or cleared by the [place] FDA and has been authorized for detection and/or diagnosis of SARS-CoV-2 by FDA under an Emergency Use Authorization (EUA). This EUA will remain in effect (meaning this test can be used) for the duration of the COVID-19 declaration under Section 564(b)(1) of the Act, 21 U.S.C. section 360bbb-3(b)(1), unless the authorization is terminated or revoked.  Performed at [facility], [place] [address] [place], [place] [address]      Radiology Studies: CT T-SPINE NO CHARGE  Result Date: 05/29/2022 CLINICAL DATA:  Trauma. EXAM: CT THORACIC AND LUMBAR SPINE WITHOUT CONTRAST TECHNIQUE: Multidetector CT imaging of the thoracic and lumbar spine was performed without contrast. Multiplanar CT image reconstructions were also generated. RADIATION DOSE REDUCTION: This exam was performed according to the departmental dose-optimization program which includes automated exposure control, adjustment of the mA and/or kV according to patient size and/or use of iterative reconstruction technique. COMPARISON:  CT of the chest abdomen pelvis dated 05/29/2022. FINDINGS: CT THORACIC SPINE FINDINGS Alignment: No acute subluxation. Vertebrae: No acute fracture.  Osteopenia. Paraspinal and other soft tissues: No acute findings. No perispinal fluid collection or hematoma. Disc levels: No acute findings.  Degenerative changes. CT LUMBAR SPINE FINDINGS Segmentation: 5 lumbar type vertebrae. Alignment: No acute subluxation. Vertebrae: No acute fracture.  Osteopenia. Paraspinal and other soft tissues: No acute findings. Disc levels: No acute findings. Multilevel degenerative changes with disc desiccation and vacuum phenomena. IMPRESSION: No acute/traumatic thoracic or lumbar spine pathology. Electronically Signed   By: Anner Crete M.D.   On: 05/29/2022 14:38   CT L-SPINE NO CHARGE  Result Date: 05/29/2022 CLINICAL DATA:  Trauma. EXAM: CT THORACIC AND LUMBAR SPINE WITHOUT CONTRAST  TECHNIQUE: Multidetector CT imaging of the thoracic and lumbar spine was performed without contrast. Multiplanar CT image reconstructions were also generated. RADIATION DOSE REDUCTION: This exam was performed according to the departmental dose-optimization program which includes automated exposure control, adjustment of the mA and/or kV according to patient  size and/or use of iterative reconstruction technique. COMPARISON:  CT of the chest abdomen pelvis dated 05/29/2022. FINDINGS: CT THORACIC SPINE FINDINGS Alignment: No acute subluxation. Vertebrae: No acute fracture.  Osteopenia. Paraspinal and other soft tissues: No acute findings. No perispinal fluid collection or hematoma. Disc levels: No acute findings.  Degenerative changes. CT LUMBAR SPINE FINDINGS Segmentation: 5 lumbar type vertebrae. Alignment: No acute subluxation. Vertebrae: No acute fracture.  Osteopenia. Paraspinal and other soft tissues: No acute findings. Disc levels: No acute findings. Multilevel degenerative changes with disc desiccation and vacuum phenomena. IMPRESSION: No acute/traumatic thoracic or lumbar spine pathology. Electronically Signed   By: Anner Crete M.D.   On: 05/29/2022 14:38   CT Angio Chest PE W and/or Wo Contrast  Result Date: 05/29/2022 CLINICAL DATA:  Concern for pulmonary embolism.  Blunt trauma. EXAM: CT ANGIOGRAPHY CHEST CT ABDOMEN AND PELVIS WITH CONTRAST TECHNIQUE: Multidetector CT imaging of the chest was performed using the standard protocol during bolus administration of intravenous contrast. Multiplanar CT image reconstructions and MIPs were obtained to evaluate the vascular anatomy. Multidetector CT imaging of the abdomen and pelvis was performed using the standard protocol during bolus administration of intravenous contrast. RADIATION DOSE REDUCTION: This exam was performed according to the departmental dose-optimization program which includes automated exposure control, adjustment of the mA and/or kV  according to patient size and/or use of iterative reconstruction technique. CONTRAST:  13m OMNIPAQUE IOHEXOL 350 MG/ML SOLN COMPARISON:  Chest radiograph dated 05/29/2022. FINDINGS: CTA CHEST FINDINGS Cardiovascular: There is mild cardiomegaly. No pericardial effusion. Three-vessel coronary vascular calcification. There is moderate atherosclerotic calcification of the thoracic aorta. The aortic isthmus measures approximately 3.5 cm in diameter. No aortic dissection. The origins of the great vessels of the aortic arch appear patent as visualized. Evaluation of the pulmonary arteries is limited due to respiratory motion and streak artifact caused by patient's arms. No pulmonary artery embolus identified. Mediastinum/Nodes: No obvious hilar adenopathy. Mildly enlarged rounded subcarinal lymph node measures 11 mm. The esophagus is grossly unremarkable. No mediastinal fluid collection. Lungs/Pleura: Bilateral lower lobe streaky and bandlike densities and areas of airspace disease primarily involving the left lower lobe may represent atelectasis/scarring. Aspiration or developing infiltrate is not entirely excluded. Clinical correlation is recommended. Bilateral, left greater right, perihilar and peribronchial thickening. There is no pleural effusion or pneumothorax. The central airways remain patent. Musculoskeletal: Degenerative changes of the spine. No acute osseous pathology. No displaced rib fractures. Evaluation however is limited due to osteopenia and streak artifact caused by patient's arms. Review of the MIP images confirms the above findings. CT ABDOMEN and PELVIS FINDINGS No intra-abdominal free air or free fluid. Hepatobiliary: The liver is grossly unremarkable. No biliary dilatation. No calcified gallstone or pericholecystic fluid. Pancreas: Unremarkable. No pancreatic ductal dilatation or surrounding inflammatory changes. Spleen: Normal in size without focal abnormality. Adrenals/Urinary Tract: The adrenal  glands are unremarkable. There is no hydronephrosis on either side. There is symmetric enhancement and excretion of contrast by both kidneys. Small foci of high attenuation within the renal collecting system may represent contrast versus small nonobstructing calculi. The visualized ureters are unremarkable. There is a 2 mm stone along the left posterior bladder wall, likely the recently passed stone. The urinary bladder is otherwise unremarkable. Stomach/Bowel: There is sigmoid diverticulosis without active inflammatory changes. There is no bowel obstruction or active inflammation. The appendix is not visualized with certainty. No inflammatory changes identified in the right lower quadrant. Vascular/Lymphatic: Moderate aortoiliac atherosclerotic disease. The IVC is unremarkable. No portal venous  gas. There is no adenopathy. Reproductive: The prostate and seminal vesicles are grossly unremarkable. No pelvic mass. Other: None Musculoskeletal: Osteopenia with degenerative changes of the spine. Total right hip arthroplasty. No acute osseous pathology. Review of the MIP images confirms the above findings. IMPRESSION: 1. No acute intrathoracic, abdominal, or pelvic pathology. No pulmonary artery embolus identified. 2. Bilateral lower lobe atelectasis/scarring. Developing infiltrate or aspiration, predominantly in the left lower lobe, is not excluded. Clinical correlation is recommended. 3. Probable recently passed 2 mm stone within the urinary bladder. No hydronephrosis. 4. Sigmoid diverticulosis. No bowel obstruction. Electronically Signed   By: Anner Crete M.D.   On: 05/29/2022 14:29   CT ABDOMEN PELVIS W CONTRAST  Result Date: 05/29/2022 CLINICAL DATA:  Concern for pulmonary embolism.  Blunt trauma. EXAM: CT ANGIOGRAPHY CHEST CT ABDOMEN AND PELVIS WITH CONTRAST TECHNIQUE: Multidetector CT imaging of the chest was performed using the standard protocol during bolus administration of intravenous contrast.  Multiplanar CT image reconstructions and MIPs were obtained to evaluate the vascular anatomy. Multidetector CT imaging of the abdomen and pelvis was performed using the standard protocol during bolus administration of intravenous contrast. RADIATION DOSE REDUCTION: This exam was performed according to the departmental dose-optimization program which includes automated exposure control, adjustment of the mA and/or kV according to patient size and/or use of iterative reconstruction technique. CONTRAST:  21m OMNIPAQUE IOHEXOL 350 MG/ML SOLN COMPARISON:  Chest radiograph dated 05/29/2022. FINDINGS: CTA CHEST FINDINGS Cardiovascular: There is mild cardiomegaly. No pericardial effusion. Three-vessel coronary vascular calcification. There is moderate atherosclerotic calcification of the thoracic aorta. The aortic isthmus measures approximately 3.5 cm in diameter. No aortic dissection. The origins of the great vessels of the aortic arch appear patent as visualized. Evaluation of the pulmonary arteries is limited due to respiratory motion and streak artifact caused by patient's arms. No pulmonary artery embolus identified. Mediastinum/Nodes: No obvious hilar adenopathy. Mildly enlarged rounded subcarinal lymph node measures 11 mm. The esophagus is grossly unremarkable. No mediastinal fluid collection. Lungs/Pleura: Bilateral lower lobe streaky and bandlike densities and areas of airspace disease primarily involving the left lower lobe may represent atelectasis/scarring. Aspiration or developing infiltrate is not entirely excluded. Clinical correlation is recommended. Bilateral, left greater right, perihilar and peribronchial thickening. There is no pleural effusion or pneumothorax. The central airways remain patent. Musculoskeletal: Degenerative changes of the spine. No acute osseous pathology. No displaced rib fractures. Evaluation however is limited due to osteopenia and streak artifact caused by patient's arms. Review of  the MIP images confirms the above findings. CT ABDOMEN and PELVIS FINDINGS No intra-abdominal free air or free fluid. Hepatobiliary: The liver is grossly unremarkable. No biliary dilatation. No calcified gallstone or pericholecystic fluid. Pancreas: Unremarkable. No pancreatic ductal dilatation or surrounding inflammatory changes. Spleen: Normal in size without focal abnormality. Adrenals/Urinary Tract: The adrenal glands are unremarkable. There is no hydronephrosis on either side. There is symmetric enhancement and excretion of contrast by both kidneys. Small foci of high attenuation within the renal collecting system may represent contrast versus small nonobstructing calculi. The visualized ureters are unremarkable. There is a 2 mm stone along the left posterior bladder wall, likely the recently passed stone. The urinary bladder is otherwise unremarkable. Stomach/Bowel: There is sigmoid diverticulosis without active inflammatory changes. There is no bowel obstruction or active inflammation. The appendix is not visualized with certainty. No inflammatory changes identified in the right lower quadrant. Vascular/Lymphatic: Moderate aortoiliac atherosclerotic disease. The IVC is unremarkable. No portal venous gas. There is no adenopathy. Reproductive: The  prostate and seminal vesicles are grossly unremarkable. No pelvic mass. Other: None Musculoskeletal: Osteopenia with degenerative changes of the spine. Total right hip arthroplasty. No acute osseous pathology. Review of the MIP images confirms the above findings. IMPRESSION: 1. No acute intrathoracic, abdominal, or pelvic pathology. No pulmonary artery embolus identified. 2. Bilateral lower lobe atelectasis/scarring. Developing infiltrate or aspiration, predominantly in the left lower lobe, is not excluded. Clinical correlation is recommended. 3. Probable recently passed 2 mm stone within the urinary bladder. No hydronephrosis. 4. Sigmoid diverticulosis. No bowel  obstruction. Electronically Signed   By: Anner Crete M.D.   On: 05/29/2022 14:29   DG Chest 1 View  Result Date: 05/29/2022 CLINICAL DATA:  Fall EXAM: CHEST  1 VIEW COMPARISON:  CXR 09/26/19 FINDINGS: No pleural effusion. No pneumothorax. The left costophrenic angle is excluded from the field of view. Unchanged cardiac and mediastinal contours. No radiographically apparent displaced rib fractures. Visualized upper abdomen is unremarkable. IMPRESSION: No radiographically apparent displaced rib fractures. Please note the left costophrenic angle is excluded from the field of view. Electronically Signed   By: Marin Roberts M.D.   On: 05/29/2022 10:45   DG Hip Unilat With Pelvis 2-3 Views Right  Result Date: 05/29/2022 CLINICAL DATA:  Right hip pain after fall today. EXAM: DG HIP (WITH OR WITHOUT PELVIS) 2-3V RIGHT COMPARISON:  December 07, 2014. FINDINGS: Status post right hip hemiarthroplasty. No fracture or dislocation is noted. IMPRESSION: No acute abnormality seen. Electronically Signed   By: Marijo Conception M.D.   On: 05/29/2022 10:43   CT HEAD WO CONTRAST  Result Date: 05/29/2022 CLINICAL DATA:  Minor trauma. EXAM: CT HEAD WITHOUT CONTRAST CT CERVICAL SPINE WITHOUT CONTRAST TECHNIQUE: Multidetector CT imaging of the head and cervical spine was performed following the standard protocol without intravenous contrast. Multiplanar CT image reconstructions of the cervical spine were also generated. RADIATION DOSE REDUCTION: This exam was performed according to the departmental dose-optimization program which includes automated exposure control, adjustment of the mA and/or kV according to patient size and/or use of iterative reconstruction technique. COMPARISON:  01/08/2022 FINDINGS: CT HEAD FINDINGS Brain: Ventricles, cisterns and other CSF spaces are within normal. There is moderate chronic ischemic microvascular disease. Old left basal ganglia lacunar infarct. No mass, mass effect, shift of midline structures  or acute hemorrhage. No evidence of acute infarction. Vascular: No hyperdense vessel or unexpected calcification. Skull: Normal. Negative for fracture or focal lesion. Sinuses/Orbits: No acute finding. Other: None. CT CERVICAL SPINE FINDINGS Alignment: Normal. Skull base and vertebrae: Vertebral body heights are normal. There is moderate spondylosis throughout the cervical spine to include uncovertebral joint spurring and facet arthropathy. Atlantoaxial articulation is normal. No acute fracture. Mild left-sided neural foraminal narrowing at the C3-4 level with mild bilateral neural foraminal narrowing at the C4-5 level. Moderate to severe bilateral neural foraminal narrowing at the C6-7 level greater than the C5-6 level. Soft tissues and spinal canal: Prevertebral soft tissues are normal. Minimal narrowing of the AP diameter of the spinal canal at the C5-6 level. Disc levels: Disc space narrowing from the C3-4 level to the C7-T1 level. Upper chest: No acute findings. Other: None. IMPRESSION: 1. No acute brain injury. 2. Moderate chronic ischemic microvascular disease. Old left basal ganglia lacunar infarct. 3. No acute cervical spine injury. 4. Moderate spondylosis throughout the cervical spine with disc disease from the C3-4 level to the C7-T1 level. Multilevel neural foraminal narrowing as described. Minimal narrowing of the AP diameter of the spinal canal at the  C5-6 level. Electronically Signed   By: Marin Olp M.D.   On: 05/29/2022 09:34   CT CERVICAL SPINE WO CONTRAST  Result Date: 05/29/2022 CLINICAL DATA:  Minor trauma. EXAM: CT HEAD WITHOUT CONTRAST CT CERVICAL SPINE WITHOUT CONTRAST TECHNIQUE: Multidetector CT imaging of the head and cervical spine was performed following the standard protocol without intravenous contrast. Multiplanar CT image reconstructions of the cervical spine were also generated. RADIATION DOSE REDUCTION: This exam was performed according to the departmental dose-optimization  program which includes automated exposure control, adjustment of the mA and/or kV according to patient size and/or use of iterative reconstruction technique. COMPARISON:  01/08/2022 FINDINGS: CT HEAD FINDINGS Brain: Ventricles, cisterns and other CSF spaces are within normal. There is moderate chronic ischemic microvascular disease. Old left basal ganglia lacunar infarct. No mass, mass effect, shift of midline structures or acute hemorrhage. No evidence of acute infarction. Vascular: No hyperdense vessel or unexpected calcification. Skull: Normal. Negative for fracture or focal lesion. Sinuses/Orbits: No acute finding. Other: None. CT CERVICAL SPINE FINDINGS Alignment: Normal. Skull base and vertebrae: Vertebral body heights are normal. There is moderate spondylosis throughout the cervical spine to include uncovertebral joint spurring and facet arthropathy. Atlantoaxial articulation is normal. No acute fracture. Mild left-sided neural foraminal narrowing at the C3-4 level with mild bilateral neural foraminal narrowing at the C4-5 level. Moderate to severe bilateral neural foraminal narrowing at the C6-7 level greater than the C5-6 level. Soft tissues and spinal canal: Prevertebral soft tissues are normal. Minimal narrowing of the AP diameter of the spinal canal at the C5-6 level. Disc levels: Disc space narrowing from the C3-4 level to the C7-T1 level. Upper chest: No acute findings. Other: None. IMPRESSION: 1. No acute brain injury. 2. Moderate chronic ischemic microvascular disease. Old left basal ganglia lacunar infarct. 3. No acute cervical spine injury. 4. Moderate spondylosis throughout the cervical spine with disc disease from the C3-4 level to the C7-T1 level. Multilevel neural foraminal narrowing as described. Minimal narrowing of the AP diameter of the spinal canal at the C5-6 level. Electronically Signed   By: Marin Olp M.D.   On: 05/29/2022 09:34    Scheduled Meds:  amLODipine  2.5 mg Oral Daily    apixaban  5 mg Oral BID   dexamethasone (DECADRON) injection  6 mg Intravenous Q24H   furosemide  10 mg Oral Daily   gabapentin  400 mg Oral BID   insulin aspart  0-9 Units Subcutaneous Q4H   metoprolol succinate  12.5 mg Oral Daily   simvastatin  20 mg Oral QPM   Continuous Infusions:  ampicillin-sulbactam (UNASYN) IV 3 g (05/30/22 0505)   remdesivir 200 mg in sodium chloride 0.9% 250 mL IVPB 200 mg (05/30/22 1055)   Followed by   Derrill Memo ON 05/31/2022] remdesivir 100 mg in sodium chloride 0.9 % 100 mL IVPB       LOS: 1 day   Darliss Cheney, MD [facility]  05/30/2022, 11:05 AM   *Please note that this is a verbal dictation therefore any spelling or grammatical errors are due to the "[place] [employer]" system interpretation.  Please page via [place] and do not message via secure chat for urgent patient care matters. Secure chat can be used for non urgent patient care matters.  How to contact the [facility] Attending or Consulting provider [place] or covering provider during after hours [place], for this patient?  Check the care team in [facility] and look for a) attending/consulting [facility] provider listed  and b) the [facility] team listed. Page or secure chat [phone]. Log into [email or web address] and use [place]'s universal password to access. If you do not have the password, please contact the hospital operator. Locate the [facility] provider you are looking for under [facility] and page to a number that you can be directly reached. If you still have difficulty reaching the provider, please page the [facility] (Director on Call) for the Hospitalists listed on amion for assistance.

## 2022-05-30 NOTE — ED Notes (Signed)
Pt continues to remove equipment. Equipment placed back onto pt. Pt placed in mittens. Pt's family at bedside educated on purpose and need of mittens. Family okay with mittens.

## 2022-05-30 NOTE — Progress Notes (Signed)
Pharmacy Antibiotic Note  Richard Kent is a 89 y.o. male admitted on 05/29/2022 with concern for aspiration pneumonia.  Pharmacy has been consulted for Unasyn dosing.  Plan: Unasyn 3g IV Q6H.  Height: 6' (182.9 cm) Weight: 82.8 kg (182 lb 8.7 oz) IBW/kg (Calculated) : 77.6  Temp (24hrs), Avg:98.4 F (36.9 C), Min:97.6 F (36.4 C), Max:98.9 F (37.2 C)  Recent Labs  Lab 05/29/22 0902 05/29/22 1250  WBC 11.3*  --   CREATININE 0.86  --   LATICACIDVEN  --  1.9    Estimated Creatinine Clearance: 61.4 mL/min (by C-G formula based on SCr of 0.86 mg/dL).    No Known Allergies   Thank you for allowing pharmacy to be a part of this patient's care.  Wynona Neat, PharmD, BCPS  05/30/2022 3:33 AM

## 2022-05-30 NOTE — Evaluation (Signed)
Clinical/Bedside Swallow Evaluation Patient Details  Name: Richard Kent MRN: [id number] Date of Birth: [birth year]  Today's Date: 05/30/2022 Time: SLP Start Time (ACUTE ONLY): 34 SLP Stop Time (ACUTE ONLY): 1525 SLP Time Calculation (min) (ACUTE ONLY): 15 min  Past Medical History:  Past Medical History:  Diagnosis Date   Carotid stenosis    CVA (cerebral infarction) 2011   Diverticulosis    Hyperlipidemia    Hypertension    Kidney stone    Smoker    Stroke ([place]) 11/17/09   Past Surgical History:  Past Surgical History:  Procedure Laterality Date   APPENDECTOMY     HERNIA REPAIR     RIGHT   HIP ARTHROPLASTY  01/15/2012   Procedure: ARTHROPLASTY BIPOLAR HIP;  Surgeon: Rozanna Box, MD;  Location: [place];  Service: Orthopedics;  Laterality: Right;   HPI:   Pt is a 89 y/o male who presents to the ED 05/29/2022 with abdominal/right groin pain following an unwitnessed fall out of his bed a day prior to presentation. On presentation, he was hypoxic in the 80s on room air requiring supplemental oxygen. CTA chest was negative for PE but showed developing infiltrate or aspiration in the left lower lobe. PMH significant for CAD, hypertension, hyperlipidemia, nephrolithiasis, type 2 diabetes, paroxysmal A-fib on Eliquis, dementia, bilateral lower extremity lymphedema.        Assessment / Plan / Recommendation  Clinical Impression  Patient presents with clinical s/s of what appears to be an oral phase dysphagia with likely impact from cognitive deficits. Patient consumed thin liquids via straw sips with timely swallow initiation  and no overt s/s aspiration or penetration. He did exhibit mildly prolonged mastication of dys 2 solids (diced canned peaches) but with full clearance post swallows. SLP recommending adjust diet to Dys 3 (mechanical soft) solids and continue with thin liquids. Plan for SLP to return one time to ensure diet toleration. SLP Visit Diagnosis: Dysphagia, unspecified  (R13.10);Dysphagia, oral phase (R13.11)    Aspiration Risk  No limitations;Mild aspiration risk    Diet Recommendation Dysphagia 3 (Mech soft);Thin liquid   Liquid Administration via: Cup;Straw Medication Administration: Whole meds with puree Supervision: Patient able to self feed;Full supervision/cueing for compensatory strategies;Staff to assist with self feeding Compensations: Slow rate;Small sips/bites;Minimize environmental distractions Postural Changes: Seated upright at 90 degrees    Other  Recommendations Oral Care Recommendations: Oral care BID;Staff/trained caregiver to provide oral care    Recommendations for follow up therapy are one component of a multi-disciplinary discharge planning process, led by the attending physician.  Recommendations may be updated based on patient status, additional functional criteria and insurance authorization.  Follow up Recommendations No SLP follow up      Assistance Recommended at Discharge    Functional Status Assessment Patient has had a recent decline in their functional status and demonstrates the ability to make significant improvements in function in a reasonable and predictable amount of time.  Frequency and Duration min 1 x/week  1 week       Prognosis Prognosis for Safe Diet Advancement: Good Barriers to Reach Goals: Cognitive deficits      Swallow Study   General Date of Onset: 05/29/22 Type of Study: Bedside Swallow Evaluation Previous Swallow Assessment: none found Diet Prior to this Study: Regular;Thin liquids Temperature Spikes Noted: No Respiratory Status: Room air (nasal cannula present but patient seems to have taken it off) History of Recent Intubation: No Behavior/Cognition: Alert;Cooperative;Pleasant mood;Confused Oral Cavity Assessment: Within Functional Limits Oral Care Completed by  SLP: No Oral Cavity - Dentition: Missing dentition Vision: Functional for self-feeding Self-Feeding Abilities: Needs  assist;Needs set up Patient Positioning: Upright in bed Baseline Vocal Quality: Normal;Low vocal intensity Volitional Cough: Cognitively unable to elicit Volitional Swallow: Unable to elicit    Oral/Motor/Sensory Function Overall Oral Motor/Sensory Function: Within functional limits   Ice Chips     Thin Liquid Thin Liquid: Within functional limits Presentation: Straw    Nectar Thick     Honey Thick     Puree Puree: Not tested   Solid     Solid: Impaired Presentation: Spoon Oral Phase Impairments: Impaired mastication Oral Phase Functional Implications: Impaired mastication     Sonia Baller, MA, CCC-SLP Speech Therapy

## 2022-05-31 DIAGNOSIS — J189 Pneumonia, unspecified organism: Secondary | ICD-10-CM | POA: Diagnosis not present

## 2022-05-31 DIAGNOSIS — U071 COVID-19: Secondary | ICD-10-CM | POA: Diagnosis not present

## 2022-05-31 DIAGNOSIS — J9601 Acute respiratory failure with hypoxia: Secondary | ICD-10-CM

## 2022-05-31 LAB — PROCALCITONIN: Procalcitonin: <0.1 ng/mL

## 2022-05-31 LAB — GLUCOSE, CAPILLARY
Glucose-Capillary: 126 mg/dL — ABNORMAL HIGH (ref 70–99)
Glucose-Capillary: 134 mg/dL — ABNORMAL HIGH (ref 70–99)
Glucose-Capillary: 140 mg/dL — ABNORMAL HIGH (ref 70–99)
Glucose-Capillary: 143 mg/dL — ABNORMAL HIGH (ref 70–99)
Glucose-Capillary: 203 mg/dL — ABNORMAL HIGH (ref 70–99)

## 2022-05-31 NOTE — Progress Notes (Signed)
PROGRESS NOTE    Key Cen  CZY:[id number] DOB: 01-01-1929 DOA: 05/29/2022 PCP: Denita Lung, MD   Brief Narrative:   89 y.o. male with medical history significant of CAD, hypertension, hyperlipidemia, nephrolithiasis, type 2 diabetes, paroxysmal A-fib on Eliquis, dementia, bilateral lower extremity lymphedema presented to the ED with abdominal/right groin pain following an unwitnessed fall out of his bed a day prior to presentation.  On presentation, he was hypoxic in the 80s on room air requiring supplemental oxygen.  CTA chest was negative for PE but showed developing infiltrate or aspiration in the left lower lobe. CT abdomen pelvis showing probable recently passed 2 mm stone within the urinary bladder and no hydronephrosis. CT head/C-spine/T-spine/L-spine, x-ray of right hip/pelvis negative for acute traumatic injury.  He was started on IV antibiotics.  He subsequently tested positive for COVID-19 and was started on remdesivir and Decadron as well.  Assessment & Plan:   Acute respiratory failure with hypoxia COVID-19 infection Left lower lobe pneumonia -Currently still on 2 L oxygen via nasal cannula.  Wean off as able.  Blood cultures negative so far. -Currently on Unasyn.  Diet as per acid recommendations -COVID-19 Labs  Recent Labs    05/30/22 0820  FERRITIN 90  LDH 220*  CRP 4.3*    Lab Results  Component Value Date   SARSCOV2NAA POSITIVE (A) 05/30/2022   [place] NEGATIVE 09/27/2019   -Monitor daily inflammatory markers -Currently on remdesivir and Decadron  CAD -Stable.  No chest pain.  Continue metoprolol succinate and statin.  Outpatient follow-up with cardiology  Chronic bilateral lower extremity edema/lymphedema -Follows up with Dr. Sharol Given as an outpatient: Last clinic visit was a week prior to presentation -Continue oral Lasix  Hypertension -Continue amlodipine, Lasix and metoprolol succinate  Diabetes mellitus type 2 with neuropathy -A1c 6.3 on  05/19/2022 -continue CBGs with SSI -Continue gabapentin  Paroxysmal A-fib -Rate controlled.  Continue Eliquis and metoprolol succinate  Dementia -Fall and delirium precautions.  PT eval.  DVT prophylaxis: Eliquis Code Status: DNR Family Communication: None at bedside Disposition Plan: Status is: Inpatient Of severity of illness.    Consultants: None  Procedures: None  Antimicrobials:  Anti-infectives (From admission, onward)    Start     Dose/Rate Route Frequency Ordered Stop   05/31/22 1000  remdesivir 100 mg in sodium chloride 0.9 % 100 mL IVPB       See Hyperspace for full Linked Orders Report.   100 mg 200 mL/hr over 30 Minutes Intravenous Daily 05/30/22 0807 06/02/22 0959   05/30/22 0900  remdesivir 200 mg in sodium chloride 0.9% 250 mL IVPB       See Hyperspace for full Linked Orders Report.   200 mg 580 mL/hr over 30 Minutes Intravenous Once 05/30/22 0807 05/30/22 1220   05/30/22 0400  Ampicillin-Sulbactam (UNASYN) 3 g in sodium chloride 0.9 % 100 mL IVPB        3 g 200 mL/hr over 30 Minutes Intravenous Every 6 hours 05/30/22 0333     05/29/22 2230  cefTRIAXone (ROCEPHIN) 1 g in sodium chloride 0.9 % 100 mL IVPB        1 g 200 mL/hr over 30 Minutes Intravenous  Once 05/29/22 2227 05/29/22 2316   05/29/22 2230  azithromycin (ZITHROMAX) 500 mg in sodium chloride 0.9 % 250 mL IVPB        500 mg 250 mL/hr over 60 Minutes Intravenous  Once 05/29/22 2227 05/30/22 0022         Subjective: Patient  seen and examined at bedside.  Looks infected, extremely slow to respond, slightly confused.  No fever, agitation, seizures reported.  Objective: Vitals:   05/30/22 0913 05/30/22 2127 05/31/22 0024 05/31/22 0753  BP: 134/75 137/73 127/82 131/70  Pulse: 85 (!) 102 77 70  Resp: '18 19 16 17  '$ Temp: 98.6 F (37 C) 98.3 F (36.8 C) 97.6 F (36.4 C) 97.8 F (36.6 C)  TempSrc: Axillary Oral Oral Oral  SpO2: 95% 93% 96% 96%  Weight:      Height:         Intake/Output Summary (Last 24 hours) at 05/31/2022 1039 Last data filed at 05/31/2022 1000 Gross per 24 hour  Intake 888.67 ml  Output --  Net 888.67 ml   Filed Weights   05/30/22 0300  Weight: 82.8 kg    Examination:  General exam: Appears calm and comfortable.  Looks chronically ill and deconditioned.  Elderly male lying in bed.  On 2 L oxygen by nasal cannula. Respiratory system: Bilateral decreased breath sounds at bases with scattered crackles Cardiovascular system: S1 & S2 heard, Rate controlled Gastrointestinal system: Abdomen is nondistended, soft and nontender. Normal bowel sounds heard. Extremities: No cyanosis, clubbing; bilateral lower extremity edema present Central nervous system: Awake, slow to respond.  Poor historian.  Confused.  No focal neurological deficits. Moving extremities Skin: No rashes, lesions or ulcers Psychiatry: Affect is flat.  No signs of agitation    Data Reviewed: I have personally reviewed following labs and imaging studies  CBC: Recent Labs  Lab 05/29/22 0902 05/30/22 0732  WBC 11.3* 10.9*  NEUTROABS 8.6*  --   HGB 13.4 12.4*  HCT 40.2 37.5*  MCV 98.8 98.2  PLT 278 735   Basic Metabolic Panel: Recent Labs  Lab 05/29/22 0902  NA 137  K 4.2  CL 103  CO2 23  GLUCOSE 114*  BUN 16  CREATININE 0.86  CALCIUM 8.6*  MG 2.1   GFR: Estimated Creatinine Clearance: 61.4 mL/min (by C-G formula based on SCr of 0.86 mg/dL). Liver Function Tests: Recent Labs  Lab 05/29/22 0902  AST 36  ALT 28  ALKPHOS 66  BILITOT 0.9  PROT 6.6  ALBUMIN 3.2*   No results for input(s): "LIPASE", "AMYLASE" in the last 168 hours. No results for input(s): "AMMONIA" in the last 168 hours. Coagulation Profile: No results for input(s): "INR", "PROTIME" in the last 168 hours. Cardiac Enzymes: No results for input(s): "CKTOTAL", "CKMB", "CKMBINDEX", "TROPONINI" in the last 168 hours. BNP (last 3 results) No results for input(s): "PROBNP" in the  last 8760 hours. HbA1C: No results for input(s): "HGBA1C" in the last 72 hours. CBG: Recent Labs  Lab 05/30/22 0911 05/30/22 1151 05/30/22 1757 05/30/22 2120 05/31/22 0754  GLUCAP 107* 106* 177* 218* 134*   Lipid Profile: No results for input(s): "CHOL", "HDL", "LDLCALC", "TRIG", "CHOLHDL", "LDLDIRECT" in the last 72 hours. Thyroid Function Tests: No results for input(s): "TSH", "T4TOTAL", "FREET4", "T3FREE", "THYROIDAB" in the last 72 hours. Anemia Panel: Recent Labs    05/30/22 0820  FERRITIN 90   Sepsis Labs: Recent Labs  Lab 05/29/22 1250 05/31/22 0812  PROCALCITON  --  <0.10  LATICACIDVEN 1.9  --     Recent Results (from the past 240 hour(s))  Blood Culture (routine x 2)     Status: None (Preliminary result)   Collection Time: 05/29/22 12:35 PM   Specimen: BLOOD LEFT HAND  Result Value Ref Range Status   Specimen Description BLOOD LEFT HAND  Final  Special Requests   Final    BOTTLES DRAWN AEROBIC AND ANAEROBIC Blood Culture adequate volume   Culture   Final    NO GROWTH 2 DAYS Performed at [facility], [place] [address], [place], [place] [address]    Report Status PENDING  Incomplete  Blood Culture (routine x 2)     Status: None (Preliminary result)   Collection Time: 05/29/22 12:42 PM   Specimen: BLOOD RIGHT HAND  Result Value Ref Range Status   Specimen Description BLOOD RIGHT HAND  Final   Special Requests   Final    BOTTLES DRAWN AEROBIC ONLY Blood Culture results may not be optimal due to an inadequate volume of blood received in culture bottles   Culture   Final    NO GROWTH 2 DAYS Performed at [facility], [place] [address], [place], [place] [address]    Report Status PENDING  Incomplete  Resp panel by RT-PCR (RSV, Flu A&B, Covid) Anterior Nasal Swab     Status: Abnormal   Collection Time: 05/30/22  4:00 AM   Specimen: Anterior Nasal Swab  Result Value Ref Range Status   SARS Coronavirus 2 by RT PCR POSITIVE (A) NEGATIVE Final     Comment: (NOTE) SARS-CoV-2 target nucleic acids are DETECTED.  The SARS-CoV-2 RNA is generally detectable in upper respiratory specimens during the acute phase of infection. Positive results are indicative of the presence of the identified virus, but do not rule out bacterial infection or co-infection with other pathogens not detected by the test. Clinical correlation with patient history and other diagnostic information is necessary to determine patient infection status. The expected result is Negative.  Fact Sheet for Patients: EntrepreneurPulse.com.au  Fact Sheet for Healthcare Providers: IncredibleEmployment.be  This test is not yet approved or cleared by the [place] FDA and  has been authorized for detection and/or diagnosis of SARS-CoV-2 by FDA under an Emergency Use Authorization (EUA).  This EUA will remain in effect (meaning this test can be used) for the duration of  the COVID-19 declaration under Section 564(b)(1) of the A ct, 21 U.S.C. section 360bbb-3(b)(1), unless the authorization is terminated or revoked sooner.     Influenza A by PCR NEGATIVE NEGATIVE Final   Influenza B by PCR NEGATIVE NEGATIVE Final    Comment: (NOTE) The Xpert Xpress SARS-CoV-2/FLU/RSV plus assay is intended as an aid in the diagnosis of influenza from Nasopharyngeal swab specimens and should not be used as a sole basis for treatment. Nasal washings and aspirates are unacceptable for Xpert Xpress SARS-CoV-2/FLU/RSV testing.  Fact Sheet for Patients: EntrepreneurPulse.com.au  Fact Sheet for Healthcare Providers: IncredibleEmployment.be  This test is not yet approved or cleared by the [place] FDA and has been authorized for detection and/or diagnosis of SARS-CoV-2 by FDA under an Emergency Use Authorization (EUA). This EUA will remain in effect (meaning this test can be used) for the duration of  the COVID-19 declaration under Section 564(b)(1) of the Act, 21 U.S.C. section 360bbb-3(b)(1), unless the authorization is terminated or revoked.     Resp Syncytial Virus by PCR NEGATIVE NEGATIVE Final    Comment: (NOTE) Fact Sheet for Patients: EntrepreneurPulse.com.au  Fact Sheet for Healthcare Providers: IncredibleEmployment.be  This test is not yet approved or cleared by the [place] FDA and has been authorized for detection and/or diagnosis of SARS-CoV-2 by FDA under an Emergency Use Authorization (EUA). This EUA will remain in effect (meaning this test can be used) for the duration of the  COVID-19 declaration under Section 564(b)(1) of the Act, 21 U.S.C. section 360bbb-3(b)(1), unless the authorization is terminated or revoked.  Performed at [facility], [place] [address] [place], [place] [address]          Radiology Studies: CT T-SPINE NO CHARGE  Result Date: 05/29/2022 CLINICAL DATA:  Trauma. EXAM: CT THORACIC AND LUMBAR SPINE WITHOUT CONTRAST TECHNIQUE: Multidetector CT imaging of the thoracic and lumbar spine was performed without contrast. Multiplanar CT image reconstructions were also generated. RADIATION DOSE REDUCTION: This exam was performed according to the departmental dose-optimization program which includes automated exposure control, adjustment of the mA and/or kV according to patient size and/or use of iterative reconstruction technique. COMPARISON:  CT of the chest abdomen pelvis dated 05/29/2022. FINDINGS: CT THORACIC SPINE FINDINGS Alignment: No acute subluxation. Vertebrae: No acute fracture.  Osteopenia. Paraspinal and other soft tissues: No acute findings. No perispinal fluid collection or hematoma. Disc levels: No acute findings.  Degenerative changes. CT LUMBAR SPINE FINDINGS Segmentation: 5 lumbar type vertebrae. Alignment: No acute subluxation. Vertebrae: No acute fracture.  Osteopenia. Paraspinal and other  soft tissues: No acute findings. Disc levels: No acute findings. Multilevel degenerative changes with disc desiccation and vacuum phenomena. IMPRESSION: No acute/traumatic thoracic or lumbar spine pathology. Electronically Signed   By: Anner Crete M.D.   On: 05/29/2022 14:38   CT L-SPINE NO CHARGE  Result Date: 05/29/2022 CLINICAL DATA:  Trauma. EXAM: CT THORACIC AND LUMBAR SPINE WITHOUT CONTRAST TECHNIQUE: Multidetector CT imaging of the thoracic and lumbar spine was performed without contrast. Multiplanar CT image reconstructions were also generated. RADIATION DOSE REDUCTION: This exam was performed according to the departmental dose-optimization program which includes automated exposure control, adjustment of the mA and/or kV according to patient size and/or use of iterative reconstruction technique. COMPARISON:  CT of the chest abdomen pelvis dated 05/29/2022. FINDINGS: CT THORACIC SPINE FINDINGS Alignment: No acute subluxation. Vertebrae: No acute fracture.  Osteopenia. Paraspinal and other soft tissues: No acute findings. No perispinal fluid collection or hematoma. Disc levels: No acute findings.  Degenerative changes. CT LUMBAR SPINE FINDINGS Segmentation: 5 lumbar type vertebrae. Alignment: No acute subluxation. Vertebrae: No acute fracture.  Osteopenia. Paraspinal and other soft tissues: No acute findings. Disc levels: No acute findings. Multilevel degenerative changes with disc desiccation and vacuum phenomena. IMPRESSION: No acute/traumatic thoracic or lumbar spine pathology. Electronically Signed   By: Anner Crete M.D.   On: 05/29/2022 14:38   CT Angio Chest PE W and/or Wo Contrast  Result Date: 05/29/2022 CLINICAL DATA:  Concern for pulmonary embolism.  Blunt trauma. EXAM: CT ANGIOGRAPHY CHEST CT ABDOMEN AND PELVIS WITH CONTRAST TECHNIQUE: Multidetector CT imaging of the chest was performed using the standard protocol during bolus administration of intravenous contrast. Multiplanar CT  image reconstructions and MIPs were obtained to evaluate the vascular anatomy. Multidetector CT imaging of the abdomen and pelvis was performed using the standard protocol during bolus administration of intravenous contrast. RADIATION DOSE REDUCTION: This exam was performed according to the departmental dose-optimization program which includes automated exposure control, adjustment of the mA and/or kV according to patient size and/or use of iterative reconstruction technique. CONTRAST:  16m OMNIPAQUE IOHEXOL 350 MG/ML SOLN COMPARISON:  Chest radiograph dated 05/29/2022. FINDINGS: CTA CHEST FINDINGS Cardiovascular: There is mild cardiomegaly. No pericardial effusion. Three-vessel coronary vascular calcification. There is moderate atherosclerotic calcification of the thoracic aorta. The aortic isthmus measures approximately 3.5 cm in diameter. No aortic dissection. The origins of the great vessels of the aortic arch appear  patent as visualized. Evaluation of the pulmonary arteries is limited due to respiratory motion and streak artifact caused by patient's arms. No pulmonary artery embolus identified. Mediastinum/Nodes: No obvious hilar adenopathy. Mildly enlarged rounded subcarinal lymph node measures 11 mm. The esophagus is grossly unremarkable. No mediastinal fluid collection. Lungs/Pleura: Bilateral lower lobe streaky and bandlike densities and areas of airspace disease primarily involving the left lower lobe may represent atelectasis/scarring. Aspiration or developing infiltrate is not entirely excluded. Clinical correlation is recommended. Bilateral, left greater right, perihilar and peribronchial thickening. There is no pleural effusion or pneumothorax. The central airways remain patent. Musculoskeletal: Degenerative changes of the spine. No acute osseous pathology. No displaced rib fractures. Evaluation however is limited due to osteopenia and streak artifact caused by patient's arms. Review of the MIP images  confirms the above findings. CT ABDOMEN and PELVIS FINDINGS No intra-abdominal free air or free fluid. Hepatobiliary: The liver is grossly unremarkable. No biliary dilatation. No calcified gallstone or pericholecystic fluid. Pancreas: Unremarkable. No pancreatic ductal dilatation or surrounding inflammatory changes. Spleen: Normal in size without focal abnormality. Adrenals/Urinary Tract: The adrenal glands are unremarkable. There is no hydronephrosis on either side. There is symmetric enhancement and excretion of contrast by both kidneys. Small foci of high attenuation within the renal collecting system may represent contrast versus small nonobstructing calculi. The visualized ureters are unremarkable. There is a 2 mm stone along the left posterior bladder wall, likely the recently passed stone. The urinary bladder is otherwise unremarkable. Stomach/Bowel: There is sigmoid diverticulosis without active inflammatory changes. There is no bowel obstruction or active inflammation. The appendix is not visualized with certainty. No inflammatory changes identified in the right lower quadrant. Vascular/Lymphatic: Moderate aortoiliac atherosclerotic disease. The IVC is unremarkable. No portal venous gas. There is no adenopathy. Reproductive: The prostate and seminal vesicles are grossly unremarkable. No pelvic mass. Other: None Musculoskeletal: Osteopenia with degenerative changes of the spine. Total right hip arthroplasty. No acute osseous pathology. Review of the MIP images confirms the above findings. IMPRESSION: 1. No acute intrathoracic, abdominal, or pelvic pathology. No pulmonary artery embolus identified. 2. Bilateral lower lobe atelectasis/scarring. Developing infiltrate or aspiration, predominantly in the left lower lobe, is not excluded. Clinical correlation is recommended. 3. Probable recently passed 2 mm stone within the urinary bladder. No hydronephrosis. 4. Sigmoid diverticulosis. No bowel obstruction.  Electronically Signed   By: Anner Crete M.D.   On: 05/29/2022 14:29   CT ABDOMEN PELVIS W CONTRAST  Result Date: 05/29/2022 CLINICAL DATA:  Concern for pulmonary embolism.  Blunt trauma. EXAM: CT ANGIOGRAPHY CHEST CT ABDOMEN AND PELVIS WITH CONTRAST TECHNIQUE: Multidetector CT imaging of the chest was performed using the standard protocol during bolus administration of intravenous contrast. Multiplanar CT image reconstructions and MIPs were obtained to evaluate the vascular anatomy. Multidetector CT imaging of the abdomen and pelvis was performed using the standard protocol during bolus administration of intravenous contrast. RADIATION DOSE REDUCTION: This exam was performed according to the departmental dose-optimization program which includes automated exposure control, adjustment of the mA and/or kV according to patient size and/or use of iterative reconstruction technique. CONTRAST:  89m OMNIPAQUE IOHEXOL 350 MG/ML SOLN COMPARISON:  Chest radiograph dated 05/29/2022. FINDINGS: CTA CHEST FINDINGS Cardiovascular: There is mild cardiomegaly. No pericardial effusion. Three-vessel coronary vascular calcification. There is moderate atherosclerotic calcification of the thoracic aorta. The aortic isthmus measures approximately 3.5 cm in diameter. No aortic dissection. The origins of the great vessels of the aortic arch appear patent as visualized. Evaluation of the pulmonary  arteries is limited due to respiratory motion and streak artifact caused by patient's arms. No pulmonary artery embolus identified. Mediastinum/Nodes: No obvious hilar adenopathy. Mildly enlarged rounded subcarinal lymph node measures 11 mm. The esophagus is grossly unremarkable. No mediastinal fluid collection. Lungs/Pleura: Bilateral lower lobe streaky and bandlike densities and areas of airspace disease primarily involving the left lower lobe may represent atelectasis/scarring. Aspiration or developing infiltrate is not entirely  excluded. Clinical correlation is recommended. Bilateral, left greater right, perihilar and peribronchial thickening. There is no pleural effusion or pneumothorax. The central airways remain patent. Musculoskeletal: Degenerative changes of the spine. No acute osseous pathology. No displaced rib fractures. Evaluation however is limited due to osteopenia and streak artifact caused by patient's arms. Review of the MIP images confirms the above findings. CT ABDOMEN and PELVIS FINDINGS No intra-abdominal free air or free fluid. Hepatobiliary: The liver is grossly unremarkable. No biliary dilatation. No calcified gallstone or pericholecystic fluid. Pancreas: Unremarkable. No pancreatic ductal dilatation or surrounding inflammatory changes. Spleen: Normal in size without focal abnormality. Adrenals/Urinary Tract: The adrenal glands are unremarkable. There is no hydronephrosis on either side. There is symmetric enhancement and excretion of contrast by both kidneys. Small foci of high attenuation within the renal collecting system may represent contrast versus small nonobstructing calculi. The visualized ureters are unremarkable. There is a 2 mm stone along the left posterior bladder wall, likely the recently passed stone. The urinary bladder is otherwise unremarkable. Stomach/Bowel: There is sigmoid diverticulosis without active inflammatory changes. There is no bowel obstruction or active inflammation. The appendix is not visualized with certainty. No inflammatory changes identified in the right lower quadrant. Vascular/Lymphatic: Moderate aortoiliac atherosclerotic disease. The IVC is unremarkable. No portal venous gas. There is no adenopathy. Reproductive: The prostate and seminal vesicles are grossly unremarkable. No pelvic mass. Other: None Musculoskeletal: Osteopenia with degenerative changes of the spine. Total right hip arthroplasty. No acute osseous pathology. Review of the MIP images confirms the above findings.  IMPRESSION: 1. No acute intrathoracic, abdominal, or pelvic pathology. No pulmonary artery embolus identified. 2. Bilateral lower lobe atelectasis/scarring. Developing infiltrate or aspiration, predominantly in the left lower lobe, is not excluded. Clinical correlation is recommended. 3. Probable recently passed 2 mm stone within the urinary bladder. No hydronephrosis. 4. Sigmoid diverticulosis. No bowel obstruction. Electronically Signed   By: Anner Crete M.D.   On: 05/29/2022 14:29        Scheduled Meds:  amLODipine  2.5 mg Oral Daily   apixaban  5 mg Oral BID   dexamethasone (DECADRON) injection  6 mg Intravenous Q24H   furosemide  10 mg Oral Daily   gabapentin  400 mg Oral BID   insulin aspart  0-5 Units Subcutaneous QHS   insulin aspart  0-9 Units Subcutaneous TID WC   metoprolol succinate  12.5 mg Oral Daily   simvastatin  20 mg Oral QPM   Continuous Infusions:  ampicillin-sulbactam (UNASYN) IV 3 g (05/31/22 0828)   remdesivir 100 mg in sodium chloride 0.9 % 100 mL IVPB            Aline August, MD [facility] 05/31/2022, 10:39 AM

## 2022-05-31 NOTE — Progress Notes (Signed)
Speech Language Pathology Treatment: Dysphagia  Patient Details Name: Richard Kent MRN: [id number] DOB: Jul 08, 1928 Today's Date: 05/31/2022 Time: 5784-6962 SLP Time Calculation (min) (ACUTE ONLY): 15 min  Assessment / Plan / Recommendation Clinical Impression  Patient seen by SLP for skilled treatment session focused on dysphagia goals. Patient was awake, alert and finishing up with PT session when SLP arrived. Patient was pleasantly confused, thinking he was in prison for "murder". PT and SLP attempted to redirect him and orient him to being in the hospital after a fall; he was not agitated but was calm and pleasantly confused. Patient was agreeable to PO's of applesauce and thin liquids (soda). He was able to feed self with all PO's and no overt s/s aspiration or penetration observed, swallow initiation appeared to be timely. SLP recommending patient continue on Dys 3 (mechanical soft) solids, thin liquids however as he is likely at or near baseline with swallow function, he may return to prior diet as he was at home if desired. SLP to s/o at this time.   HPI HPI: Pt is a 89 y/o male who presents to the ED 05/29/2022 with abdominal/right groin pain following an unwitnessed fall out of his bed a day prior to presentation. On presentation, he was hypoxic in the 80s on room air requiring supplemental oxygen. CTA chest was negative for PE but showed developing infiltrate or aspiration in the left lower lobe. PMH significant for CAD, hypertension, hyperlipidemia, nephrolithiasis, type 2 diabetes, paroxysmal A-fib on Eliquis, dementia, bilateral lower extremity lymphedema.      SLP Plan  All goals met;Discharge SLP treatment due to (comment)      Recommendations for follow up therapy are one component of a multi-disciplinary discharge planning process, led by the attending physician.  Recommendations may be updated based on patient status, additional functional criteria and insurance authorization.     Recommendations  Diet recommendations: Dysphagia 3 (mechanical soft);Thin liquid Liquids provided via: Cup;Straw Medication Administration: Whole meds with puree Supervision: Patient able to self feed;Intermittent supervision to cue for compensatory strategies Compensations: Slow rate;Small sips/bites;Minimize environmental distractions Postural Changes and/or Swallow Maneuvers: Seated upright 90 degrees                Oral Care Recommendations: Oral care BID;Staff/trained caregiver to provide oral care Follow Up Recommendations: No SLP follow up Assistance recommended at discharge: Intermittent Supervision/Assistance SLP Visit Diagnosis: Dysphagia, unspecified (R13.10);Dysphagia, oral phase (R13.11) Plan: All goals met;Discharge SLP treatment due to (comment)          Richard Baller, MA, CCC-SLP Speech Therapy

## 2022-05-31 NOTE — Evaluation (Signed)
Physical Therapy Evaluation  Patient Details Name: Richard Kent MRN: [id number] DOB: 12-06-1928 Today's Date: 05/31/2022  History of Present Illness  Pt is a 89 y/o male who presents to the ED 05/29/2022 with abdominal/right groin pain following an unwitnessed fall out of his bed a day prior to presentation. On presentation, he was hypoxic in the 80s on room air requiring supplemental oxygen. CTA chest was negative for PE but showed developing infiltrate or aspiration in the left lower lobe. PMH significant for CAD, hypertension, hyperlipidemia, nephrolithiasis, type 2 diabetes, paroxysmal A-fib on Eliquis, dementia, bilateral lower extremity lymphedema.   Clinical Impression  Pt admitted with above diagnosis. Pt currently with functional limitations due to the deficits listed below (see PT Problem List). At the time of PT eval pt confused, oriented to self only and reporting he was being accused of murder and in jail. RN provided some history after session but pt unable to provide any meaningful history. It appears that pt's grandson has been transferring pt to/from wheelchair and pt has not been ambulating. It is unclear how long it's been since pt has transferred himself or ambulated. He currently requires +2 assist for OOB transfers, ideally with a lift for optimal safety. Recommend SNF level rehab at d/c to maximize functional return and decrease risk for falls in the future. Acutely, pt will benefit from skilled PT to increase their independence and safety with mobility to allow discharge to the venue listed below.          Recommendations for follow up therapy are one component of a multi-disciplinary discharge planning process, led by the attending physician.  Recommendations may be updated based on patient status, additional functional criteria and insurance authorization.  Follow Up Recommendations Skilled nursing-short term rehab (<3 hours/day) Can patient physically be transported by  private vehicle: No    Assistance Recommended at Discharge Frequent or constant Supervision/Assistance  Patient can return home with the following  Two people to help with walking and/or transfers;Two people to help with bathing/dressing/bathroom;Assistance with cooking/housework;Assist for transportation;Help with stairs or ramp for entrance    Equipment Recommendations Other (comment) (TBD by next venue of care)  Recommendations for Other Services       Functional Status Assessment Patient has had a recent decline in their functional status and demonstrates the ability to make significant improvements in function in a reasonable and predictable amount of time.     Precautions / Restrictions Precautions Precautions: Fall Restrictions Weight Bearing Restrictions: No      Mobility  Bed Mobility Overal bed mobility: Needs Assistance Bed Mobility: Rolling, Supine to Sit, Sit to Supine Rolling: Mod assist   Supine to sit: Mod assist Sit to supine: Max assist   General bed mobility comments: Assist for transitioning to/from EOB. Pt reaching out for therapist's hand to pull to full sitting position. Increased time and use of bed pad to scoot out and get feet on the floor.    Transfers Overall transfer level: Needs assistance Equipment used: Ambulation equipment used Transfers: Sit to/from Stand Sit to Stand: Total assist           General transfer comment: Attempted sit<>stand with [facility] for support. Pt was able to pull with LUE but B feet sliding forward and unable to achieve stand. Attempted x2.    Ambulation/Gait               General Gait Details: Unable - appears to be nonambulatory at baseline.  Stairs  Wheelchair Mobility    Modified Rankin (Stroke Patients Only)       Balance Overall balance assessment: Needs assistance Sitting-balance support: Feet supported, No upper extremity supported Sitting balance-Leahy Scale: Fair      Standing balance support: Single extremity supported, Reliant on assistive device for balance Standing balance-Leahy Scale: Zero                               Pertinent Vitals/Pain Pain Assessment Pain Assessment: Faces Faces Pain Scale: Hurts even more Pain Location: RLE Pain Descriptors / Indicators: Grimacing, Guarding, Sore Pain Intervention(s): Limited activity within patient's tolerance, Monitored during session, Repositioned    Home Living Family/patient expects to be discharged to:: Skilled nursing facility                        Prior Function Prior Level of Function : Needs assist             Mobility Comments: No family present to provide information and pt is a poor historian. Per nursing staff, grandson was essentially lifting pt to transfer to/from the wheelchair as pt having difficulty putting weight through his feet ADLs Comments: No family present to provide information and pt is a poor historian.     Hand Dominance   Dominant Hand: Right (But now left due to prior CVA (~2011?))    Extremity/Trunk Assessment   Upper Extremity Assessment Upper Extremity Assessment: RUE deficits/detail RUE Deficits / Details: RUE deficits from prior stroke. Elbow flexion contracture with grossly limited use of UE throughout functional mobility.    Lower Extremity Assessment Lower Extremity Assessment: RLE deficits/detail;LLE deficits/detail RLE Deficits / Details: Bilaterally, LE's wrapped and noted toes on R side black. Strength and AROM less on R than L.    Cervical / Trunk Assessment Cervical / Trunk Assessment: Kyphotic;Other exceptions Cervical / Trunk Exceptions: Forward head posture with rounded shoulders  Communication   Communication: HOH  Cognition Arousal/Alertness: Awake/alert Behavior During Therapy: WFL for tasks assessed/performed Overall Cognitive Status: Impaired/Different from baseline Area of Impairment: Orientation,  Attention, Memory, Following commands, Safety/judgement, Awareness, Problem solving                 Orientation Level: Disoriented to, Place, Time, Situation Current Attention Level: Focused Memory: Decreased short-term memory Following Commands: Follows one step commands with increased time Safety/Judgement: Decreased awareness of safety, Decreased awareness of deficits Awareness: Intellectual Problem Solving: Slow processing, Decreased initiation, Requires verbal cues          General Comments      Exercises     Assessment/Plan    PT Assessment Patient needs continued PT services  PT Problem List Decreased strength;Decreased range of motion;Decreased activity tolerance;Decreased balance;Decreased mobility;Decreased coordination;Decreased cognition;Decreased knowledge of use of DME;Decreased safety awareness;Decreased knowledge of precautions;Pain       PT Treatment Interventions DME instruction;Gait training;Functional mobility training;Therapeutic activities;Therapeutic exercise;Balance training;Cognitive remediation;Patient/family education    PT Goals (Current goals can be found in the Care Plan section)  Acute Rehab PT Goals Patient Stated Goal: None stated PT Goal Formulation: Patient unable to participate in goal setting Time For Goal Achievement: 06/14/22 Potential to Achieve Goals: Fair    Frequency Min 2X/week     Co-evaluation               AM-PAC PT "6 Clicks" Mobility  Outcome Measure Help needed turning from your back to your side while in  a flat bed without using bedrails?: A Lot Help needed moving from lying on your back to sitting on the side of a flat bed without using bedrails?: A Lot Help needed moving to and from a bed to a chair (including a wheelchair)?: Total Help needed standing up from a chair using your arms (e.g., wheelchair or bedside chair)?: Total Help needed to walk in hospital room?: Total Help needed climbing 3-5 steps  with a railing? : Total 6 Click Score: 8    End of Session Equipment Utilized During Treatment: Gait belt Activity Tolerance: Patient tolerated treatment well Patient left: in bed;with call bell/phone within reach;Other (comment) (SLP present in room for session) Nurse Communication: Mobility status;Need for lift equipment PT Visit Diagnosis: Muscle weakness (generalized) (M62.81);History of falling (Z91.81);Difficulty in walking, not elsewhere classified (R26.2)    Time: 8309-4076 PT Time Calculation (min) (ACUTE ONLY): 32 min   Charges:   PT Evaluation $PT Eval High Complexity: 1 High PT Treatments $Therapeutic Activity: 8-22 mins        Rolinda Roan, PT, DPT Acute Rehabilitation Services Secure Chat Preferred Office: [phone]   Thelma Comp 05/31/2022, 4:40 PM

## 2022-05-31 NOTE — Plan of Care (Signed)

## 2022-06-01 DIAGNOSIS — J9601 Acute respiratory failure with hypoxia: Secondary | ICD-10-CM | POA: Diagnosis not present

## 2022-06-01 DIAGNOSIS — U071 COVID-19: Secondary | ICD-10-CM | POA: Diagnosis not present

## 2022-06-01 LAB — COMPREHENSIVE METABOLIC PANEL
ALT: 27 U/L (ref 0–44)
AST: 26 U/L (ref 15–41)
Albumin: 2.5 g/dL — ABNORMAL LOW (ref 3.5–5.0)
Alkaline Phosphatase: 49 U/L (ref 38–126)
Anion gap: 11 (ref 5–15)
BUN: 20 mg/dL (ref 8–23)
CO2: 23 mmol/L (ref 22–32)
Calcium: 8 mg/dL — ABNORMAL LOW (ref 8.9–10.3)
Chloride: 103 mmol/L (ref 98–111)
Creatinine, Ser: 1.01 mg/dL (ref 0.61–1.24)
GFR, Estimated: >60 mL/min (ref >60)
Glucose, Bld: 107 mg/dL — ABNORMAL HIGH (ref 70–99)
Potassium: 3.9 mmol/L (ref 3.5–5.1)
Sodium: 137 mmol/L (ref 135–145)
Total Bilirubin: 0.8 mg/dL (ref 0.3–1.2)
Total Protein: 5.7 g/dL — ABNORMAL LOW (ref 6.5–8.1)

## 2022-06-01 LAB — CBC WITH DIFFERENTIAL/PLATELET
Abs Immature Granulocytes: 0.05 10*3/uL (ref 0.00–0.07)
Basophils Absolute: 0 10*3/uL (ref 0.0–0.1)
Basophils Relative: 0 %
Eosinophils Absolute: 0 10*3/uL (ref 0.0–0.5)
Eosinophils Relative: 0 %
HCT: 35.2 % — ABNORMAL LOW (ref 39.0–52.0)
Hemoglobin: 12.3 g/dL — ABNORMAL LOW (ref 13.0–17.0)
Immature Granulocytes: 0 %
Lymphocytes Relative: 7 %
Lymphs Abs: 1 10*3/uL (ref 0.7–4.0)
MCH: 33.7 pg (ref 26.0–34.0)
MCHC: 34.9 g/dL (ref 30.0–36.0)
MCV: 96.4 fL (ref 80.0–100.0)
Monocytes Absolute: 1 10*3/uL (ref 0.1–1.0)
Monocytes Relative: 7 %
Neutro Abs: 11.5 10*3/uL — ABNORMAL HIGH (ref 1.7–7.7)
Neutrophils Relative %: 86 %
Platelets: 250 10*3/uL (ref 150–400)
RBC: 3.65 MIL/uL — ABNORMAL LOW (ref 4.22–5.81)
RDW: 14 % (ref 11.5–15.5)
WBC: 13.6 10*3/uL — ABNORMAL HIGH (ref 4.0–10.5)
nRBC: 0 % (ref 0.0–0.2)

## 2022-06-01 LAB — C-REACTIVE PROTEIN: CRP: 3.2 mg/dL — ABNORMAL HIGH (ref <1.0)

## 2022-06-01 LAB — GLUCOSE, CAPILLARY
Glucose-Capillary: 117 mg/dL — ABNORMAL HIGH (ref 70–99)
Glucose-Capillary: 109 mg/dL — ABNORMAL HIGH (ref 70–99)
Glucose-Capillary: 131 mg/dL — ABNORMAL HIGH (ref 70–99)
Glucose-Capillary: 214 mg/dL — ABNORMAL HIGH (ref 70–99)
Glucose-Capillary: 155 mg/dL — ABNORMAL HIGH (ref 70–99)

## 2022-06-01 LAB — PROCALCITONIN: Procalcitonin: <0.1 ng/mL

## 2022-06-01 LAB — MAGNESIUM: Magnesium: 2.1 mg/dL (ref 1.7–2.4)

## 2022-06-01 NOTE — [place] FL2 (Signed)
[place] MEDICAID FL2 LEVEL OF CARE FORM     IDENTIFICATION  Patient Name: Richard Kent Birthdate: 1928/07/01 Sex: male Admission Date (Current Location): 05/29/2022  [facility] and [place] Number:  Herbalist and Address:  [facility] [place] [address], [place], [place] [address]      Provider Number: [id number]  Attending Physician Name and Address:  Aline August, MD  Relative Name and Phone Number:  Camillo Quadros  [phone]    Current Level of Care: Hospital Recommended Level of Care: [place] Prior Approval Number:    Date Approved/Denied:   PASRR Number: [id number]  Discharge Plan: SNF    Current Diagnoses: Patient Active Problem List   Diagnosis Date Noted   COVID-19 virus infection 05/30/2022   Acute hypoxemic respiratory failure ([place]) 05/30/2022   CAD (coronary artery disease) 05/30/2022   Pneumonia 05/29/2022   Basal cell carcinoma 05/24/2022   Atrial fibrillation ([place]) 05/19/2022   Type II diabetes mellitus with complication ([place]) 36/64/4034   New onset type 2 diabetes mellitus ([place]) 09/29/2021   Aortic atherosclerosis ([place]) 09/19/2021   Diarrhea 09/27/2019   Senile purpura ([place]) 01/09/2017   Carotid stenosis, bilateral 01/14/2012   Hypertension 01/12/2012   Hyperlipidemia LDL goal <100 12/17/2009   Hemiplegia, late effect of cerebrovascular disease ([place]) 12/17/2009   RBBB 10/21/2009    Orientation RESPIRATION BLADDER Height & Weight     Self  Normal Incontinent, External catheter Weight: 186 lb 4.6 oz (84.5 kg) Height:  6' (182.9 cm)  BEHAVIORAL SYMPTOMS/MOOD NEUROLOGICAL BOWEL NUTRITION STATUS      Incontinent Diet (See DC summary)  AMBULATORY STATUS COMMUNICATION OF NEEDS Skin   Extensive Assist Verbally Surgical wounds, Other (Comment) (L great and middle toes with wound, Vertebral column incision)                       Personal Care Assistance Level of Assistance  Bathing, Feeding,  Dressing Bathing Assistance: Maximum assistance Feeding assistance: Limited assistance Dressing Assistance: Maximum assistance     Functional Limitations Info  Sight, Hearing, Speech Sight Info: Adequate Hearing Info: Adequate Speech Info: Adequate    SPECIAL CARE FACTORS FREQUENCY  PT (By licensed PT), OT (By licensed OT)     PT Frequency: 5x week OT Frequency: 5x week            Contractures Contractures Info: Not present    Additional Factors Info  Code Status, Allergies, Insulin Sliding Scale Code Status Info: DNR Allergies Info: NKA   Insulin Sliding Scale Info: See DC summary       Current Medications (06/01/2022):  This is the current hospital active medication list Current Facility-Administered Medications  Medication Dose Route Frequency Provider Last Rate Last Admin   acetaminophen (TYLENOL) tablet 500 mg  500 mg Oral Q6H PRN Darliss Cheney, MD   500 mg at 05/31/22 2202   amLODipine (NORVASC) tablet 2.5 mg  2.5 mg Oral Daily Shela Leff, MD   2.5 mg at 06/01/22 0856   Ampicillin-Sulbactam (UNASYN) 3 g in sodium chloride 0.9 % 100 mL IVPB  3 g Intravenous Q6H Bryk, Veronda P, RPH 200 mL/hr at 06/01/22 0859 3 g at 06/01/22 0859   apixaban (ELIQUIS) tablet 5 mg  5 mg Oral BID Shela Leff, MD   5 mg at 06/01/22 0857   dexamethasone (DECADRON) injection 6 mg  6 mg Intravenous Q24H Shela Leff, MD   6 mg at 06/01/22 1025   furosemide (  LASIX) tablet 10 mg  10 mg Oral Daily Shela Leff, MD   10 mg at 06/01/22 0856   gabapentin (NEURONTIN) capsule 400 mg  400 mg Oral BID Shela Leff, MD   400 mg at 06/01/22 0854   insulin aspart (novoLOG) injection 0-5 Units  0-5 Units Subcutaneous QHS Darliss Cheney, MD   2 Units at 05/30/22 2146   insulin aspart (novoLOG) injection 0-9 Units  0-9 Units Subcutaneous TID WC Darliss Cheney, MD   3 Units at 05/31/22 1727   metoprolol succinate (TOPROL-XL) 24 hr tablet 12.5 mg  12.5 mg Oral Daily Shela Leff, MD   12.5 mg at 06/01/22 0854   simvastatin (ZOCOR) tablet 20 mg  20 mg Oral QPM Shela Leff, MD   20 mg at 05/31/22 1727   traMADol (ULTRAM) tablet 100 mg  100 mg Oral Q8H PRN Darliss Cheney, MD   100 mg at 05/30/22 2109     Discharge Medications: Please see discharge summary for a list of discharge medications.  Relevant Imaging Results:  Relevant Lab Results:   Additional Information SS# [place], [place]

## 2022-06-01 NOTE — Progress Notes (Signed)
1/11 Pt under Airborne and Enteric Precautions. I spoke with patient's daughter Santiago Glad) and explained and received acknowledgement of IMM Letter. Letter will be mailed to address on file.

## 2022-06-01 NOTE — Evaluation (Signed)
Occupational Therapy Evaluation Patient Details Name: Richard Kent MRN: [id number] DOB: March 11, 1929 Today's Date: 06/01/2022   History of Present Illness Pt is a 89 y/o male who presents to the ED 05/29/2022 with abdominal/right groin pain following an unwitnessed fall out of his bed a day prior to presentation. On presentation, he was hypoxic in the 80s on room air requiring supplemental oxygen. CTA chest was negative for PE but showed developing infiltrate or aspiration in the left lower lobe. PMH significant for CAD, hypertension, hyperlipidemia, nephrolithiasis, type 2 diabetes, paroxysmal A-fib on Eliquis, dementia, bilateral lower extremity lymphedema.   Clinical Impression   Pt in bed upon therapy arrival and agreeable to participate in OT evaluation. Prior to hospitalization, pt was receiving total assist from spouse and family members to complete all BADL tasks and functional transfers. Pt was non-ambulatory transferring to and from the w/c with max-total assist. Maximove was used to complete functional transfers this session with 2 person assist. Pt was able to assist with bed mobility while using bed rails to roll right and left. SNF is recommended at discharge to focus on functional transfers and increase pt's sitting balance in order to return to previous level of function. All OT needs can be met at next venue of care.      Recommendations for follow up therapy are one component of a multi-disciplinary discharge planning process, led by the attending physician.  Recommendations may be updated based on patient status, additional functional criteria and insurance authorization.   Follow Up Recommendations  Skilled nursing-short term rehab (<3 hours/day)     Assistance Recommended at Discharge Frequent or constant Supervision/Assistance  Patient can return home with the following Two people to help with walking and/or transfers;Two people to help with bathing/dressing/bathroom;Assistance  with feeding;Assist for transportation    Functional Status Assessment  Patient has had a recent decline in their functional status and demonstrates the ability to make significant improvements in function in a reasonable and predictable amount of time.  Equipment Recommendations  Other (comment) (TBD)       Precautions / Restrictions Precautions Precautions: Fall Precaution Comments: right side hemi baseline with hx of CVA, dementia, HOH Restrictions Weight Bearing Restrictions: No      Mobility Bed Mobility Overal bed mobility: Needs Assistance Bed Mobility: Rolling Rolling: Mod assist           Patient Response: Cooperative  Transfers Overall transfer level: Needs assistance Equipment used: Ambulation equipment used Transfers: Bed to chair/wheelchair/BSC   Transfer via Lift Equipment: Maximove    Balance Overall balance assessment: Mild deficits observed, not formally tested         ADL either performed or assessed with clinical judgement   ADL Overall ADL's : At baseline     General ADL Comments: Pt requires total assist for bathing, dressing, toileting, grooming, and functional transfers.     Vision Baseline Vision/History: 0 No visual deficits Ability to See in Adequate Light: 0 Adequate Patient Visual Report: No change from baseline Vision Assessment?: No apparent visual deficits            Pertinent Vitals/Pain Pain Assessment Pain Assessment: Faces Faces Pain Scale: No hurt     Hand Dominance Right (initially right handed although now left handed due to CVA)   Extremity/Trunk Assessment Upper Extremity Assessment Upper Extremity Assessment: RUE deficits/detail;LUE deficits/detail RUE Deficits / Details: Right side hemi at baseline due to past CVA. Right shoulder and elbow contracture limiting functional use of UE. Able to passively perform  shoulder flexion through 25% range. Elbow remains at 90 degrees flexion with very minimal give.  Functional passive supination/pronation, wrist flexion/extension. Able to demonstrate functional composite finger extension and flexion with weak gross grasp present. RUE Coordination: decreased gross motor;decreased fine motor LUE Deficits / Details: Demonstrates functional strength needed to complete bed mobility (rolling righ and left using bed rails).   Lower Extremity Assessment Lower Extremity Assessment: Defer to PT evaluation       Communication Communication Communication: HOH   Cognition Arousal/Alertness: Awake/alert Behavior During Therapy: WFL for tasks assessed/performed Overall Cognitive Status: History of cognitive impairments - at baseline         Memory: Decreased short-term memory Following Commands: Follows one step commands with increased time       General Comments: Pt with history of dementia                Home Living Family/patient expects to be discharged to:: Skilled nursing facility Living Arrangements: Other relatives;Spouse/significant other;Children            Prior Functioning/Environment Prior Level of Function : Needs assist  Cognitive Assist : ADLs (cognitive);Mobility (cognitive)     Physical Assist : Mobility (physical);ADLs (physical) Mobility (physical): Bed mobility;Transfers ADLs (physical): Grooming;Bathing;Dressing;Toileting;IADLs Mobility Comments: Pt is nonambulatory completing only transfers from bed to w/c with family assisting. Grandson reports that he would lift him up and get him into the w/c. Unable to bear weight into bilateral feet due to wounds. ADLs Comments: Pt completed bed baths and dressed in bed with Wife assisting. Pt was able to button his shirt.        OT Problem List: Decreased strength;Impaired balance (sitting and/or standing)         OT Goals(Current goals can be found in the care plan section) Acute Rehab OT Goals Patient Stated Goal: None stated  OT Frequency:  1 time visit       AM-PAC  OT "6 Clicks" Daily Activity     Outcome Measure Help from another person eating meals?: A Little Help from another person taking care of personal grooming?: Total Help from another person toileting, which includes using toliet, bedpan, or urinal?: Total Help from another person bathing (including washing, rinsing, drying)?: Total Help from another person to put on and taking off regular upper body clothing?: Total Help from another person to put on and taking off regular lower body clothing?: Total 6 Click Score: 8   End of Session Equipment Utilized During Treatment: Oxygen Nurse Communication: Mobility status;Need for lift equipment  Activity Tolerance: Patient tolerated treatment well Patient left: in chair;with call bell/phone within reach;with chair alarm set;with family/visitor present;with nursing/sitter in room  OT Visit Diagnosis: Muscle weakness (generalized) (M62.81)                Time: 1308-6578 OT Time Calculation (min): 27 min Charges:  OT General Charges $OT Visit: 1 Visit OT Evaluation $OT Eval Moderate Complexity: 1 Mod  [employer], OTR/L,CBIS  Supplemental OT - [facility] and [facility] Secure Chat Preferred    Jaelee Laughter, Clarene Duke 06/01/2022, 4:07 PM

## 2022-06-01 NOTE — Care Management Important Message (Signed)
Important Message  Patient Details  Name: Richard Kent MRN: [id number] Date of Birth: 1928-06-08   Medicare Important Message Given:  Other (see comment)     Hannah Beat 06/01/2022, 2:47 PM

## 2022-06-01 NOTE — Progress Notes (Addendum)
PROGRESS NOTE    Richard Kent  XBJ:[id number] DOB: 15-Jun-1928 DOA: 05/29/2022 PCP: Denita Lung, MD   Brief Narrative:   89 y.o. male with medical history significant of CAD, hypertension, hyperlipidemia, nephrolithiasis, type 2 diabetes, paroxysmal A-fib on Eliquis, dementia, bilateral lower extremity lymphedema presented to the ED with abdominal/right groin pain following an unwitnessed fall out of his bed a day prior to presentation.  On presentation, he was hypoxic in the 80s on room air requiring supplemental oxygen.  CTA chest was negative for PE but showed developing infiltrate or aspiration in the left lower lobe. CT abdomen pelvis showing probable recently passed 2 mm stone within the urinary bladder and no hydronephrosis. CT head/C-spine/T-spine/L-spine, x-ray of right hip/pelvis negative for acute traumatic injury.  He was started on IV antibiotics.  He subsequently tested positive for COVID-19 and was started on remdesivir and Decadron as well.  Assessment & Plan:   Acute respiratory failure with hypoxia COVID-19 infection Left lower lobe pneumonia: COVID 19 pneumonia with possibly combination of aspiration pneumonia -Currently still on 2 L oxygen via nasal cannula.  Wean off as able.  Blood cultures negative so far. -Currently on Unasyn.  Diet as per SLP recommendations -COVID-19 Labs  Recent Labs    05/30/22 0820 06/01/22 0433  FERRITIN 90  --   LDH 220*  --   CRP 4.3* 3.2*     Lab Results  Component Value Date   SARSCOV2NAA POSITIVE (A) 05/30/2022   [place] NEGATIVE 09/27/2019   -Monitor daily inflammatory markers -Currently on remdesivir and Decadron  CAD -Stable.  No chest pain.  Continue metoprolol succinate and statin.  Outpatient follow-up with cardiology  Chronic bilateral lower extremity edema/lymphedema Gangrenous changes of toes of left foot -Follows up with Dr. Sharol Given as an outpatient: Last clinic visit was a week prior to presentation.  Family  requested evaluation by Dr. Sharol Given while the patient is in the hospital.  Dr. Sharol Given is aware.  Follow his recommendations. -Continue oral Lasix  Hypertension -Continue amlodipine, Lasix and metoprolol succinate  Leukocytosis -Mild.  Monitor intermittently.  Diabetes mellitus type 2 with neuropathy -A1c 6.3 on 05/19/2022 -continue CBGs with SSI -Continue gabapentin  Paroxysmal A-fib -Rate controlled.  Continue Eliquis and metoprolol succinate  Dementia -Fall and delirium precautions.    Physical deconditioning -PT recommends SNF placement.  TOC consulted.  DVT prophylaxis: Eliquis Code Status: DNR Family Communication: None at bedside Disposition Plan: Status is: Inpatient Of severity of illness.   Consultants: Orthopedics/Dr. Sharol Given  Procedures: None  Antimicrobials:  Anti-infectives (From admission, onward)    Start     Dose/Rate Route Frequency Ordered Stop   05/31/22 1000  remdesivir 100 mg in sodium chloride 0.9 % 100 mL IVPB       See Hyperspace for full Linked Orders Report.   100 mg 200 mL/hr over 30 Minutes Intravenous Daily 05/30/22 0807 06/02/22 0959   05/30/22 0900  remdesivir 200 mg in sodium chloride 0.9% 250 mL IVPB       See Hyperspace for full Linked Orders Report.   200 mg 580 mL/hr over 30 Minutes Intravenous Once 05/30/22 0807 05/30/22 1220   05/30/22 0400  Ampicillin-Sulbactam (UNASYN) 3 g in sodium chloride 0.9 % 100 mL IVPB        3 g 200 mL/hr over 30 Minutes Intravenous Every 6 hours 05/30/22 0333     05/29/22 2230  cefTRIAXone (ROCEPHIN) 1 g in sodium chloride 0.9 % 100 mL IVPB  1 g 200 mL/hr over 30 Minutes Intravenous  Once 05/29/22 2227 05/29/22 2316   05/29/22 2230  azithromycin (ZITHROMAX) 500 mg in sodium chloride 0.9 % 250 mL IVPB        500 mg 250 mL/hr over 60 Minutes Intravenous  Once 05/29/22 2227 05/30/22 0022         Subjective: Patient seen and examined at bedside.  Poor historian; still confused and slow to  respond.  No seizures, fever, vomiting or agitation reported.  Objective: Vitals:   05/31/22 2010 06/01/22 0418 06/01/22 0419 06/01/22 0805  BP: 126/67 139/62  (!) 162/65  Pulse: 76 61  65  Resp: '18 18  16  '$ Temp: 98.4 F (36.9 C) 98.2 F (36.8 C)  97.8 F (36.6 C)  TempSrc: Oral Oral  Oral  SpO2: 94% 91%  95%  Weight:   84.5 kg   Height:        Intake/Output Summary (Last 24 hours) at 06/01/2022 0811 Last data filed at 05/31/2022 2024 Gross per 24 hour  Intake 691.21 ml  Output 500 ml  Net 191.21 ml    Filed Weights   05/30/22 0300 06/01/22 0419  Weight: 82.8 kg 84.5 kg    Examination:  General: On 2 L oxygen via nasal cannula.  No distress.  Looks chronically ill and deconditioned ENT/neck: No thyromegaly.  JVD is not elevated  respiratory: Decreased breath sounds at bases bilaterally with some crackles; no wheezing  CVS: S1-S2 heard, rate controlled currently Abdominal: Soft, nontender, slightly distended; no organomegaly, bowel sounds are heard Extremities: Trace lower extremity edema; no cyanosis  CNS: Awake, still very slow to respond.  Confused.  Poor historian.  No focal neurologic deficit.  Moves extremities Lymph: No obvious lymphadenopathy Skin: Gangrenous changes of left great toe and left third toe  psych: Not agitated currently.  Flat affect. musculoskeletal: No obvious joint swelling/deformity     Data Reviewed: I have personally reviewed following labs and imaging studies  CBC: Recent Labs  Lab 05/29/22 0902 05/30/22 0732 06/01/22 0433  WBC 11.3* 10.9* 13.6*  NEUTROABS 8.6*  --  11.5*  HGB 13.4 12.4* 12.3*  HCT 40.2 37.5* 35.2*  MCV 98.8 98.2 96.4  PLT 278 258 834    Basic Metabolic Panel: Recent Labs  Lab 05/29/22 0902 06/01/22 0433  NA 137 137  K 4.2 3.9  CL 103 103  CO2 23 23  GLUCOSE 114* 107*  BUN 16 20  CREATININE 0.86 1.01  CALCIUM 8.6* 8.0*  MG 2.1 2.1    GFR: Estimated Creatinine Clearance: 52.3 mL/min (by C-G  formula based on SCr of 1.01 mg/dL). Liver Function Tests: Recent Labs  Lab 05/29/22 0902 06/01/22 0433  AST 36 26  ALT 28 27  ALKPHOS 66 49  BILITOT 0.9 0.8  PROT 6.6 5.7*  ALBUMIN 3.2* 2.5*    No results for input(s): "LIPASE", "AMYLASE" in the last 168 hours. No results for input(s): "AMMONIA" in the last 168 hours. Coagulation Profile: No results for input(s): "INR", "PROTIME" in the last 168 hours. Cardiac Enzymes: No results for input(s): "CKTOTAL", "CKMB", "CKMBINDEX", "TROPONINI" in the last 168 hours. BNP (last 3 results) No results for input(s): "PROBNP" in the last 8760 hours. HbA1C: No results for input(s): "HGBA1C" in the last 72 hours. CBG: Recent Labs  Lab 05/31/22 1659 05/31/22 2007 05/31/22 2358 06/01/22 0421 06/01/22 0808  GLUCAP 203* 140* 126* 117* 109*    Lipid Profile: No results for input(s): "CHOL", "HDL", "LDLCALC", "TRIG", "CHOLHDL", "  LDLDIRECT" in the last 72 hours. Thyroid Function Tests: No results for input(s): "TSH", "T4TOTAL", "FREET4", "T3FREE", "THYROIDAB" in the last 72 hours. Anemia Panel: Recent Labs    05/30/22 0820  FERRITIN 90    Sepsis Labs: Recent Labs  Lab 05/29/22 1250 05/31/22 0812 06/01/22 0433  PROCALCITON  --  <0.10 <0.10  LATICACIDVEN 1.9  --   --      Recent Results (from the past 240 hour(s))  Blood Culture (routine x 2)     Status: None (Preliminary result)   Collection Time: 05/29/22 12:35 PM   Specimen: BLOOD LEFT HAND  Result Value Ref Range Status   Specimen Description BLOOD LEFT HAND  Final   Special Requests   Final    BOTTLES DRAWN AEROBIC AND ANAEROBIC Blood Culture adequate volume   Culture   Final    NO GROWTH 2 DAYS Performed at [facility], [place] [address], [place], [place] [address]    Report Status PENDING  Incomplete  Blood Culture (routine x 2)     Status: None (Preliminary result)   Collection Time: 05/29/22 12:42 PM   Specimen: BLOOD RIGHT HAND  Result Value Ref Range  Status   Specimen Description BLOOD RIGHT HAND  Final   Special Requests   Final    BOTTLES DRAWN AEROBIC ONLY Blood Culture results may not be optimal due to an inadequate volume of blood received in culture bottles   Culture   Final    NO GROWTH 2 DAYS Performed at [facility], [place] [address] [place], [place] [address]    Report Status PENDING  Incomplete  Resp panel by RT-PCR (RSV, Flu A&B, Covid) Anterior Nasal Swab     Status: Abnormal   Collection Time: 05/30/22  4:00 AM   Specimen: Anterior Nasal Swab  Result Value Ref Range Status   SARS Coronavirus 2 by RT PCR POSITIVE (A) NEGATIVE Final    Comment: (NOTE) SARS-CoV-2 target nucleic acids are DETECTED.  The SARS-CoV-2 RNA is generally detectable in upper respiratory specimens during the acute phase of infection. Positive results are indicative of the presence of the identified virus, but do not rule out bacterial infection or co-infection with other pathogens not detected by the test. Clinical correlation with patient history and other diagnostic information is necessary to determine patient infection status. The expected result is Negative.  Fact Sheet for Patients: EntrepreneurPulse.com.au  Fact Sheet for Healthcare Providers: IncredibleEmployment.be  This test is not yet approved or cleared by the [place] FDA and  has been authorized for detection and/or diagnosis of SARS-CoV-2 by FDA under an Emergency Use Authorization (EUA).  This EUA will remain in effect (meaning this test can be used) for the duration of  the COVID-19 declaration under Section 564(b)(1) of the A ct, 21 U.S.C. section 360bbb-3(b)(1), unless the authorization is terminated or revoked sooner.     Influenza A by PCR NEGATIVE NEGATIVE Final   Influenza B by PCR NEGATIVE NEGATIVE Final    Comment: (NOTE) The Xpert Xpress SARS-CoV-2/FLU/RSV plus assay is intended as an aid in the diagnosis of  influenza from Nasopharyngeal swab specimens and should not be used as a sole basis for treatment. Nasal washings and aspirates are unacceptable for Xpert Xpress SARS-CoV-2/FLU/RSV testing.  Fact Sheet for Patients: EntrepreneurPulse.com.au  Fact Sheet for Healthcare Providers: IncredibleEmployment.be  This test is not yet approved or cleared by the [place] FDA and has been authorized for detection and/or diagnosis of SARS-CoV-2 by  FDA under an Emergency Use Authorization (EUA). This EUA will remain in effect (meaning this test can be used) for the duration of the COVID-19 declaration under Section 564(b)(1) of the Act, 21 U.S.C. section 360bbb-3(b)(1), unless the authorization is terminated or revoked.     Resp Syncytial Virus by PCR NEGATIVE NEGATIVE Final    Comment: (NOTE) Fact Sheet for Patients: EntrepreneurPulse.com.au  Fact Sheet for Healthcare Providers: IncredibleEmployment.be  This test is not yet approved or cleared by the [place] FDA and has been authorized for detection and/or diagnosis of SARS-CoV-2 by FDA under an Emergency Use Authorization (EUA). This EUA will remain in effect (meaning this test can be used) for the duration of the COVID-19 declaration under Section 564(b)(1) of the Act, 21 U.S.C. section 360bbb-3(b)(1), unless the authorization is terminated or revoked.  Performed at [facility], [place] [address] [place], [place] [address]          Radiology Studies: No results found.      Scheduled Meds:  amLODipine  2.5 mg Oral Daily   apixaban  5 mg Oral BID   dexamethasone (DECADRON) injection  6 mg Intravenous Q24H   furosemide  10 mg Oral Daily   gabapentin  400 mg Oral BID   insulin aspart  0-5 Units Subcutaneous QHS   insulin aspart  0-9 Units Subcutaneous TID WC   metoprolol succinate  12.5 mg Oral Daily   simvastatin  20 mg Oral QPM    Continuous Infusions:  ampicillin-sulbactam (UNASYN) IV 3 g (06/01/22 0354)   remdesivir 100 mg in sodium chloride 0.9 % 100 mL IVPB 100 mg (05/31/22 1041)          Aline August, MD [facility] 06/01/2022, 8:11 AM

## 2022-06-01 NOTE — TOC Initial Note (Addendum)
Transition of Care [facility]) - Initial/Assessment Note    Patient Details  Name: Richard Kent MRN: [id number] Date of Birth: Jun 19, 1928  [facility] CM/SW Contact:    Coralee Pesa, [place] Phone Number: 06/01/2022, 1:40 PM  Clinical Narrative:                 CSW spoke with daughter, [name] and advised her of SNF rec. Per daughter pt has been slowly declining over the last few months. Pt lives at home and the entire family helps. They have PT/OT and other services. Pt has been wheelchair bound due to foot injury. Pt had been to [facility] after his CVA in 2011. Family is working with an elder care lawyer and may pursue LTC after SNF. Family mentioned [facility] as a preference, are agreeable to a further fax out. Medicare . Gov info provided and medicare coverage discussed. All questions answered. TOC will continue to follow for DC needs.   Expected Discharge Plan: Skilled Nursing Facility Barriers to Discharge: Continued Medical Work up, SNF Pending bed offer   Patient Goals and CMS Choice Patient states their goals for this hospitalization and ongoing recovery are:: Pt disoriented and unable to participate in goal setting. CMS Medicare.gov Compare Post Acute Care list provided to:: Patient Represenative (must comment) (son) Choice offered to / list presented to : Adult Children      Expected Discharge Plan and Services     Post Acute Care Choice: [place] Living arrangements for the past 2 months: Single Family Home                                      Prior Living Arrangements/Services Living arrangements for the past 2 months: Single Family Home Lives with:: Adult Children Patient language and need for interpreter reviewed:: No Do you feel safe going back to the place where you live?: Yes      Need for Family Participation in Patient Care: Yes (Comment) Care giver support system in place?: Yes (comment) Current home services: DME, Home  OT, Home PT Criminal Activity/Legal Involvement Pertinent to Current Situation/Hospitalization: Yes - Comment as needed  Activities of Daily Living Home Assistive Devices/Equipment: Built-in shower seat, Wheelchair, Transfer belt, Grab bars in shower, [employer], CBG Meter ADL Screening (condition at time of admission) Patient's cognitive ability adequate to safely complete daily activities?: Yes Is the patient deaf or have difficulty hearing?: No Does the patient have difficulty seeing, even when wearing glasses/contacts?: Yes Does the patient have difficulty concentrating, remembering, or making decisions?: Yes Patient able to express need for assistance with ADLs?: Yes Does the patient have difficulty dressing or bathing?: Yes Independently performs ADLs?: No Communication: Independent Dressing (OT): Needs assistance Is this a change from baseline?: Pre-admission baseline Grooming: Needs assistance Is this a change from baseline?: Pre-admission baseline Feeding: Needs assistance Is this a change from baseline?: Pre-admission baseline Bathing: Needs assistance Is this a change from baseline?: Pre-admission baseline Toileting: Needs assistance Is this a change from baseline?: Pre-admission baseline In/Out Bed: Independent with device (comment), Needs assistance Is this a change from baseline?: Pre-admission baseline Walks in Home: Needs assistance Is this a change from baseline?: Pre-admission baseline Does the patient have difficulty walking or climbing stairs?: Yes Weakness of Legs: Both Weakness of Arms/Hands: Right  Permission Sought/Granted Permission sought to share information with : Family Supports Permission granted to share information with :  Yes, Verbal Permission Granted  Share Information with NAME: Durante Violett     Permission granted to share info w Relationship: Son     Emotional Assessment Appearance:: Appears stated age Attitude/Demeanor/Rapport: Unable  to Assess Affect (typically observed): Unable to Assess Orientation: : Oriented to Self Alcohol / Substance Use: Not Applicable Psych Involvement: No (comment)  Admission diagnosis:  Pneumonia [J18.9] Hypoxia [R09.02] Fall, initial encounter [W19.XXXA] Pneumonia of left lower lobe due to infectious organism [J18.9] Patient Active Problem List   Diagnosis Date Noted   COVID-19 virus infection 05/30/2022   Acute hypoxemic respiratory failure ([place]) 05/30/2022   CAD (coronary artery disease) 05/30/2022   Pneumonia 05/29/2022   Basal cell carcinoma 05/24/2022   Atrial fibrillation ([place]) 05/19/2022   Type II diabetes mellitus with complication ([place]) 62/70/3500   New onset type 2 diabetes mellitus ([place]) 09/29/2021   Aortic atherosclerosis ([place]) 09/19/2021   Diarrhea 09/27/2019   Senile purpura ([place]) 01/09/2017   Carotid stenosis, bilateral 01/14/2012   Hypertension 01/12/2012   Hyperlipidemia LDL goal <100 12/17/2009   Hemiplegia, late effect of cerebrovascular disease ([place]) 12/17/2009   RBBB 10/21/2009   PCP:  Denita Lung, MD Pharmacy:   [place], [place] - [address] [place] [place] [address] Phone: [phone] Fax: [phone]     Social Determinants of Health (SDOH) Social History: [place]: No Food Insecurity (05/30/2022)  Housing: Low Risk  (05/30/2022)  Transportation Needs: No Transportation Needs (05/30/2022)  Utilities: Not At Risk (05/30/2022)  Depression (PHQ2-9): Low Risk  (09/09/2021)  Financial Resource Strain: Low Risk  (09/09/2021)  Physical Activity: Sufficiently Active (09/09/2021)  Stress: No Stress Concern Present (09/09/2021)  Tobacco Use: Medium Risk (05/31/2022)   SDOH Interventions:     Readmission Risk Interventions    09/28/2019    2:22 PM  Readmission Risk Prevention Plan  Post Dischage Appt Complete  Medication Screening Complete  Transportation Screening  Complete

## 2022-06-01 NOTE — Social Work (Addendum)
CSW acknowledges consult for SNF placement. CSW left VM for daughter [name]. Spoke with dtr Santiago Glad who stated that [name] would be the point of contact. Will complete workup when family is available.

## 2022-06-02 DIAGNOSIS — J9601 Acute respiratory failure with hypoxia: Secondary | ICD-10-CM | POA: Diagnosis not present

## 2022-06-02 DIAGNOSIS — I739 Peripheral vascular disease, unspecified: Secondary | ICD-10-CM

## 2022-06-02 DIAGNOSIS — J189 Pneumonia, unspecified organism: Secondary | ICD-10-CM | POA: Diagnosis not present

## 2022-06-02 DIAGNOSIS — U071 COVID-19: Secondary | ICD-10-CM | POA: Diagnosis not present

## 2022-06-02 DIAGNOSIS — E118 Type 2 diabetes mellitus with unspecified complications: Secondary | ICD-10-CM | POA: Diagnosis not present

## 2022-06-02 LAB — CBC WITH DIFFERENTIAL/PLATELET
Abs Immature Granulocytes: 0.06 10*3/uL (ref 0.00–0.07)
Basophils Absolute: 0 10*3/uL (ref 0.0–0.1)
Basophils Relative: 0 %
Eosinophils Absolute: 0 10*3/uL (ref 0.0–0.5)
Eosinophils Relative: 0 %
HCT: 36.4 % — ABNORMAL LOW (ref 39.0–52.0)
Hemoglobin: 12.4 g/dL — ABNORMAL LOW (ref 13.0–17.0)
Immature Granulocytes: 1 %
Lymphocytes Relative: 6 %
Lymphs Abs: 0.7 10*3/uL (ref 0.7–4.0)
MCH: 33.2 pg (ref 26.0–34.0)
MCHC: 34.1 g/dL (ref 30.0–36.0)
MCV: 97.6 fL (ref 80.0–100.0)
Monocytes Absolute: 0.9 10*3/uL (ref 0.1–1.0)
Monocytes Relative: 7 %
Neutro Abs: 10.5 10*3/uL — ABNORMAL HIGH (ref 1.7–7.7)
Neutrophils Relative %: 86 %
Platelets: 263 10*3/uL (ref 150–400)
RBC: 3.73 MIL/uL — ABNORMAL LOW (ref 4.22–5.81)
RDW: 13.9 % (ref 11.5–15.5)
WBC: 12.2 10*3/uL — ABNORMAL HIGH (ref 4.0–10.5)
nRBC: 0 % (ref 0.0–0.2)

## 2022-06-02 LAB — COMPREHENSIVE METABOLIC PANEL
ALT: 42 U/L (ref 0–44)
AST: 33 U/L (ref 15–41)
Albumin: 2.7 g/dL — ABNORMAL LOW (ref 3.5–5.0)
Alkaline Phosphatase: 49 U/L (ref 38–126)
Anion gap: 8 (ref 5–15)
BUN: 21 mg/dL (ref 8–23)
CO2: 22 mmol/L (ref 22–32)
Calcium: 7.9 mg/dL — ABNORMAL LOW (ref 8.9–10.3)
Chloride: 104 mmol/L (ref 98–111)
Creatinine, Ser: 0.95 mg/dL (ref 0.61–1.24)
GFR, Estimated: >60 mL/min (ref >60)
Glucose, Bld: 139 mg/dL — ABNORMAL HIGH (ref 70–99)
Potassium: 4 mmol/L (ref 3.5–5.1)
Sodium: 134 mmol/L — ABNORMAL LOW (ref 135–145)
Total Bilirubin: 0.6 mg/dL (ref 0.3–1.2)
Total Protein: 5.8 g/dL — ABNORMAL LOW (ref 6.5–8.1)

## 2022-06-02 LAB — GLUCOSE, CAPILLARY
Glucose-Capillary: 117 mg/dL — ABNORMAL HIGH (ref 70–99)
Glucose-Capillary: 127 mg/dL — ABNORMAL HIGH (ref 70–99)
Glucose-Capillary: 137 mg/dL — ABNORMAL HIGH (ref 70–99)
Glucose-Capillary: 148 mg/dL — ABNORMAL HIGH (ref 70–99)
Glucose-Capillary: 154 mg/dL — ABNORMAL HIGH (ref 70–99)
Glucose-Capillary: 176 mg/dL — ABNORMAL HIGH (ref 70–99)
Glucose-Capillary: 214 mg/dL — ABNORMAL HIGH (ref 70–99)

## 2022-06-02 LAB — PROCALCITONIN: Procalcitonin: <0.1 ng/mL

## 2022-06-02 LAB — MAGNESIUM: Magnesium: 2.1 mg/dL (ref 1.7–2.4)

## 2022-06-02 LAB — C-REACTIVE PROTEIN: CRP: 1.6 mg/dL — ABNORMAL HIGH (ref <1.0)

## 2022-06-02 MED ORDER — MELATONIN 3 MG PO TABS
6.0000 mg | ORAL_TABLET | Freq: Every evening | ORAL | Status: AC | PRN
  Administered 2022-06-02 – 2022-06-03 (×3): 6 mg via ORAL
  Filled 2022-06-02 (×3): qty 2

## 2022-06-02 NOTE — Plan of Care (Signed)
  Problem: Education: Goal: Knowledge of General Education information will improve Description: Including pain rating scale, medication(s)/side effects and non-pharmacologic comfort measures Outcome: Progressing

## 2022-06-02 NOTE — Progress Notes (Signed)
Physical Therapy Treatment Patient Details Name: Richard Kent MRN: [id number] DOB: January 27, 1929 Today's Date: 06/02/2022   History of Present Illness Pt is a 89 y/o male who presents to the ED 05/29/2022 with abdominal/right groin pain following an unwitnessed fall out of his bed a day prior to presentation. On presentation, he was hypoxic in the 80s on room air requiring supplemental oxygen. CTA chest was negative for PE but showed developing infiltrate or aspiration in the left lower lobe. PMH significant for CAD, hypertension, hyperlipidemia, nephrolithiasis, type 2 diabetes, paroxysmal A-fib on Eliquis, dementia, bilateral lower extremity lymphedema.    PT Comments    Focus of session was transfer OOB to chair with Maximove lift. Pt had a BM on the bedpan at beginning of session. Noted skin over sacrum red and sacral foam replaced as it was soiled. Will continue to follow and progress as able per POC.    Recommendations for follow up therapy are one component of a multi-disciplinary discharge planning process, led by the attending physician.  Recommendations may be updated based on patient status, additional functional criteria and insurance authorization.  Follow Up Recommendations  Skilled nursing-short term rehab (<3 hours/day) Can patient physically be transported by private vehicle: No   Assistance Recommended at Discharge Frequent or constant Supervision/Assistance  Patient can return home with the following Two people to help with walking and/or transfers;Two people to help with bathing/dressing/bathroom;Assistance with cooking/housework;Assist for transportation;Help with stairs or ramp for entrance   Equipment Recommendations  Other (comment) (TBD by next venue of care)    Recommendations for Other Services       Precautions / Restrictions Precautions Precautions: Fall Precaution Comments: right side hemi baseline with hx of CVA, dementia, HOH Restrictions Weight Bearing  Restrictions: No     Mobility  Bed Mobility Overal bed mobility: Needs Assistance Bed Mobility: Rolling Rolling: Mod assist, Min assist         General bed mobility comments: Pt rolled x6 for placement/removal of bed pan, hygiene, and lift pad placement. Min assist required when roiling R and mod assist required when rolling L.    Transfers Overall transfer level: Needs assistance Equipment used: Ambulation equipment used Transfers: Bed to chair/wheelchair/BSC             General transfer comment: Maximove lift utilized for bed to chair. Pt tolerated well and was able to be positioned well in the chair at end of session with RUE supported on pillows and heels floated. Transfer via Lift Equipment: Maximove  Ambulation/Gait               General Gait Details: Unable - appears to be nonambulatory at baseline.   Stairs             Wheelchair Mobility    Modified Rankin (Stroke Patients Only)       Balance Overall balance assessment: Mild deficits observed, not formally tested Sitting-balance support: Feet supported, No upper extremity supported Sitting balance-Leahy Scale: Fair     Standing balance support: Single extremity supported, Reliant on assistive device for balance Standing balance-Leahy Scale: Zero                              Cognition Arousal/Alertness: Awake/alert Behavior During Therapy: WFL for tasks assessed/performed Overall Cognitive Status: History of cognitive impairments - at baseline Area of Impairment: Orientation, Attention, Memory, Following commands, Safety/judgement, Awareness, Problem solving  Orientation Level: Disoriented to, Place, Time, Situation Current Attention Level: Focused Memory: Decreased short-term memory Following Commands: Follows one step commands with increased time Safety/Judgement: Decreased awareness of safety, Decreased awareness of deficits Awareness:  Intellectual Problem Solving: Slow processing, Decreased initiation, Requires verbal cues General Comments: Pt with history of dementia        Exercises      General Comments        Pertinent Vitals/Pain Pain Assessment Pain Assessment: Faces Faces Pain Scale: No hurt Breathing: normal Negative Vocalization: none Facial Expression: smiling or inexpressive Body Language: relaxed Consolability: no need to console PAINAD Score: 0 Pain Location: RLE Pain Descriptors / Indicators: Grimacing, Guarding, Sore Pain Intervention(s): Limited activity within patient's tolerance, Monitored during session, Repositioned    Home Living                          Prior Function            PT Goals (current goals can now be found in the care plan section) Acute Rehab PT Goals Patient Stated Goal: None stated PT Goal Formulation: Patient unable to participate in goal setting Time For Goal Achievement: 06/14/22 Potential to Achieve Goals: Fair Progress towards PT goals: Progressing toward goals    Frequency    Min 2X/week      PT Plan Current plan remains appropriate    Co-evaluation              AM-PAC PT "6 Clicks" Mobility   Outcome Measure  Help needed turning from your back to your side while in a flat bed without using bedrails?: A Lot Help needed moving from lying on your back to sitting on the side of a flat bed without using bedrails?: A Lot Help needed moving to and from a bed to a chair (including a wheelchair)?: Total Help needed standing up from a chair using your arms (e.g., wheelchair or bedside chair)?: Total Help needed to walk in hospital room?: Total Help needed climbing 3-5 steps with a railing? : Total 6 Click Score: 8    End of Session Equipment Utilized During Treatment: Gait belt Activity Tolerance: Patient tolerated treatment well Patient left: in chair;with call bell/phone within reach;with chair alarm set;with family/visitor  present Nurse Communication: Mobility status;Need for lift equipment PT Visit Diagnosis: Muscle weakness (generalized) (M62.81);History of falling (Z91.81);Difficulty in walking, not elsewhere classified (R26.2)     Time: 2951-8841 PT Time Calculation (min) (ACUTE ONLY): 38 min  Charges:  $Therapeutic Activity: 38-52 mins                     Richard Kent, PT, DPT Acute Rehabilitation Services Secure Chat Preferred Office: [phone]    Richard Kent 06/02/2022, 1:07 PM

## 2022-06-02 NOTE — Progress Notes (Signed)
PROGRESS NOTE    Bright Spielmann  WIO:[id number] DOB: 1928/12/23 DOA: 05/29/2022 PCP: Denita Lung, MD   Brief Narrative:   89 y.o. male with medical history significant of CAD, hypertension, hyperlipidemia, nephrolithiasis, type 2 diabetes, paroxysmal A-fib on Eliquis, dementia, bilateral lower extremity lymphedema presented to the ED with abdominal/right groin pain following an unwitnessed fall out of his bed a day prior to presentation.  On presentation, he was hypoxic in the 80s on room air requiring supplemental oxygen.  CTA chest was negative for PE but showed developing infiltrate or aspiration in the left lower lobe. CT abdomen pelvis showing probable recently passed 2 mm stone within the urinary bladder and no hydronephrosis. CT head/C-spine/T-spine/L-spine, x-ray of right hip/pelvis negative for acute traumatic injury.  He was started on IV antibiotics.  He subsequently tested positive for COVID-19 and was started on remdesivir and Decadron as well.  Assessment & Plan:   Acute respiratory failure with hypoxia COVID-19 infection Left lower lobe pneumonia: COVID 19 pneumonia with possibly combination of aspiration pneumonia -Currently still on 2 L oxygen via nasal cannula.  Wean off as able.  Blood cultures negative so far. -Currently on Unasyn.  Diet as per SLP recommendations -COVID-19 Labs  Recent Labs    05/30/22 0820 06/01/22 0433 06/02/22 0352  FERRITIN 90  --   --   LDH 220*  --   --   CRP 4.3* 3.2* 1.6*     Lab Results  Component Value Date   SARSCOV2NAA POSITIVE (A) 05/30/2022   [place] NEGATIVE 09/27/2019   -Monitor daily inflammatory markers -Currently on Decadron.  Completed remdesivir  CAD -Stable.  No chest pain.  Continue metoprolol succinate and statin.  Outpatient follow-up with cardiology  Chronic bilateral lower extremity edema/lymphedema Gangrenous changes of toes of left foot -Follows up with Dr. Sharol Given as an outpatient: Last clinic visit was a  week prior to presentation.  Family requested evaluation by Dr. Sharol Given while the patient is in the hospital.  Dr. Sharol Given is aware.  Follow his recommendations. -Continue oral Lasix  Hypertension -Continue amlodipine, Lasix and metoprolol succinate  Leukocytosis -Mild.  Monitor intermittently.  Diabetes mellitus type 2 with neuropathy -A1c 6.3 on 05/19/2022 -continue CBGs with SSI -Continue gabapentin  Paroxysmal A-fib -Rate controlled.  Continue Eliquis and metoprolol succinate  Dementia -Fall and delirium precautions.   -Consult palliative care for goals of care discussion  Physical deconditioning -PT recommends SNF placement.  TOC consulted.  DVT prophylaxis: Eliquis Code Status: DNR Family Communication: None at bedside Disposition Plan: Status is: Inpatient Of severity of illness.  Need for SNF placement   Consultants: Orthopedics/Dr. Sharol Given  Procedures: None  Antimicrobials:  Anti-infectives (From admission, onward)    Start     Dose/Rate Route Frequency Ordered Stop   05/31/22 1000  remdesivir 100 mg in sodium chloride 0.9 % 100 mL IVPB       See Hyperspace for full Linked Orders Report.   100 mg 200 mL/hr over 30 Minutes Intravenous Daily 05/30/22 0807 06/01/22 1055   05/30/22 0900  remdesivir 200 mg in sodium chloride 0.9% 250 mL IVPB       See Hyperspace for full Linked Orders Report.   200 mg 580 mL/hr over 30 Minutes Intravenous Once 05/30/22 0807 05/30/22 1220   05/30/22 0400  Ampicillin-Sulbactam (UNASYN) 3 g in sodium chloride 0.9 % 100 mL IVPB        3 g 200 mL/hr over 30 Minutes Intravenous Every 6 hours 05/30/22 0333  05/29/22 2230  cefTRIAXone (ROCEPHIN) 1 g in sodium chloride 0.9 % 100 mL IVPB        1 g 200 mL/hr over 30 Minutes Intravenous  Once 05/29/22 2227 05/29/22 2316   05/29/22 2230  azithromycin (ZITHROMAX) 500 mg in sodium chloride 0.9 % 250 mL IVPB        500 mg 250 mL/hr over 60 Minutes Intravenous  Once 05/29/22 2227 05/30/22 0022          Subjective: Patient seen and examined at bedside.  Poor historian; slow to respond and confused.  No agitation, seizures, vomiting or fever reported.   Objective: Vitals:   06/01/22 2043 06/02/22 0353 06/02/22 0500 06/02/22 0801  BP: (!) 146/54 139/79  (!) 159/79  Pulse: 78 62  68  Resp: 17   18  Temp: 97.6 F (36.4 C) 97.7 F (36.5 C)  97.8 F (36.6 C)  TempSrc: Oral Oral  Axillary  SpO2: 95% 95%  96%  Weight:   84.5 kg   Height:        Intake/Output Summary (Last 24 hours) at 06/02/2022 0803 Last data filed at 06/01/2022 1657 Gross per 24 hour  Intake 480 ml  Output 1350 ml  Net -870 ml    Filed Weights   05/30/22 0300 06/01/22 0419 06/02/22 0500  Weight: 82.8 kg 84.5 kg 84.5 kg    Examination:  General: No acute distress currently.  Still on 2 L oxygen via nasal cannula.  Looks chronically ill and deconditioned ENT/neck: No palpable neck masses or JVD elevation noted respiratory: Bilateral decreased breath sounds at bases with scattered crackles  CVS: Mostly rate controlled; S1-S2 heard Abdominal: Soft, nontender, distended mildly; no organomegaly, normal bowel sounds heard  extremities: No clubbing; mild lower extremity edema present  CNS: Wakes up slightly; extremely slow to respond.  Still confused.  Poor historian.  No focal neurologic deficit.  Able to move extremities  lymph: No obvious cervical lymphadenopathy noted  skin: Gangrenous changes of left great toe and left third toe present psych: Affect is extremely flat.  Not agitated. musculoskeletal: No obvious other joint erythema/tenderness     Data Reviewed: I have personally reviewed following labs and imaging studies  CBC: Recent Labs  Lab 05/29/22 0902 05/30/22 0732 06/01/22 0433 06/02/22 0352  WBC 11.3* 10.9* 13.6* 12.2*  NEUTROABS 8.6*  --  11.5* 10.5*  HGB 13.4 12.4* 12.3* 12.4*  HCT 40.2 37.5* 35.2* 36.4*  MCV 98.8 98.2 96.4 97.6  PLT 278 258 250 062    Basic Metabolic  Panel: Recent Labs  Lab 05/29/22 0902 06/01/22 0433 06/02/22 0352  NA 137 137 134*  K 4.2 3.9 4.0  CL 103 103 104  CO2 '23 23 22  '$ GLUCOSE 114* 107* 139*  BUN '16 20 21  '$ CREATININE 0.86 1.01 0.95  CALCIUM 8.6* 8.0* 7.9*  MG 2.1 2.1 2.1    GFR: Estimated Creatinine Clearance: 55.6 mL/min (by C-G formula based on SCr of 0.95 mg/dL). Liver Function Tests: Recent Labs  Lab 05/29/22 0902 06/01/22 0433 06/02/22 0352  AST 36 26 33  ALT 28 27 42  ALKPHOS 66 49 49  BILITOT 0.9 0.8 0.6  PROT 6.6 5.7* 5.8*  ALBUMIN 3.2* 2.5* 2.7*    No results for input(s): "LIPASE", "AMYLASE" in the last 168 hours. No results for input(s): "AMMONIA" in the last 168 hours. Coagulation Profile: No results for input(s): "INR", "PROTIME" in the last 168 hours. Cardiac Enzymes: No results for input(s): "CKTOTAL", "CKMB", "CKMBINDEX", "  TROPONINI" in the last 168 hours. BNP (last 3 results) No results for input(s): "PROBNP" in the last 8760 hours. HbA1C: No results for input(s): "HGBA1C" in the last 72 hours. CBG: Recent Labs  Lab 06/01/22 1654 06/01/22 2045 06/02/22 0011 06/02/22 0350 06/02/22 0758  GLUCAP 214* 155* 127* 148* 117*    Lipid Profile: No results for input(s): "CHOL", "HDL", "LDLCALC", "TRIG", "CHOLHDL", "LDLDIRECT" in the last 72 hours. Thyroid Function Tests: No results for input(s): "TSH", "T4TOTAL", "FREET4", "T3FREE", "THYROIDAB" in the last 72 hours. Anemia Panel: Recent Labs    05/30/22 0820  FERRITIN 90    Sepsis Labs: Recent Labs  Lab 05/29/22 1250 05/31/22 0812 06/01/22 0433 06/02/22 0352  PROCALCITON  --  <0.10 <0.10 <0.10  LATICACIDVEN 1.9  --   --   --      Recent Results (from the past 240 hour(s))  Blood Culture (routine x 2)     Status: None (Preliminary result)   Collection Time: 05/29/22 12:35 PM   Specimen: BLOOD LEFT HAND  Result Value Ref Range Status   Specimen Description BLOOD LEFT HAND  Final   Special Requests   Final    BOTTLES  DRAWN AEROBIC AND ANAEROBIC Blood Culture adequate volume   Culture   Final    NO GROWTH 3 DAYS Performed at [facility], [place] [address], [place], [place] [address]    Report Status PENDING  Incomplete  Blood Culture (routine x 2)     Status: None (Preliminary result)   Collection Time: 05/29/22 12:42 PM   Specimen: BLOOD RIGHT HAND  Result Value Ref Range Status   Specimen Description BLOOD RIGHT HAND  Final   Special Requests   Final    BOTTLES DRAWN AEROBIC ONLY Blood Culture results may not be optimal due to an inadequate volume of blood received in culture bottles   Culture   Final    NO GROWTH 3 DAYS Performed at [facility], [place] [address], [place], [place] [address]    Report Status PENDING  Incomplete  Resp panel by RT-PCR (RSV, Flu A&B, Covid) Anterior Nasal Swab     Status: Abnormal   Collection Time: 05/30/22  4:00 AM   Specimen: Anterior Nasal Swab  Result Value Ref Range Status   SARS Coronavirus 2 by RT PCR POSITIVE (A) NEGATIVE Final    Comment: (NOTE) SARS-CoV-2 target nucleic acids are DETECTED.  The SARS-CoV-2 RNA is generally detectable in upper respiratory specimens during the acute phase of infection. Positive results are indicative of the presence of the identified virus, but do not rule out bacterial infection or co-infection with other pathogens not detected by the test. Clinical correlation with patient history and other diagnostic information is necessary to determine patient infection status. The expected result is Negative.  Fact Sheet for Patients: EntrepreneurPulse.com.au  Fact Sheet for Healthcare Providers: IncredibleEmployment.be  This test is not yet approved or cleared by the [place] FDA and  has been authorized for detection and/or diagnosis of SARS-CoV-2 by FDA under an Emergency Use Authorization (EUA).  This EUA will remain in effect (meaning this test can be used) for the  duration of  the COVID-19 declaration under Section 564(b)(1) of the A ct, 21 U.S.C. section 360bbb-3(b)(1), unless the authorization is terminated or revoked sooner.     Influenza A by PCR NEGATIVE NEGATIVE Final   Influenza B by PCR NEGATIVE NEGATIVE Final    Comment: (NOTE) The Xpert Xpress SARS-CoV-2/FLU/RSV plus assay is  intended as an aid in the diagnosis of influenza from Nasopharyngeal swab specimens and should not be used as a sole basis for treatment. Nasal washings and aspirates are unacceptable for Xpert Xpress SARS-CoV-2/FLU/RSV testing.  Fact Sheet for Patients: EntrepreneurPulse.com.au  Fact Sheet for Healthcare Providers: IncredibleEmployment.be  This test is not yet approved or cleared by the [place] FDA and has been authorized for detection and/or diagnosis of SARS-CoV-2 by FDA under an Emergency Use Authorization (EUA). This EUA will remain in effect (meaning this test can be used) for the duration of the COVID-19 declaration under Section 564(b)(1) of the Act, 21 U.S.C. section 360bbb-3(b)(1), unless the authorization is terminated or revoked.     Resp Syncytial Virus by PCR NEGATIVE NEGATIVE Final    Comment: (NOTE) Fact Sheet for Patients: EntrepreneurPulse.com.au  Fact Sheet for Healthcare Providers: IncredibleEmployment.be  This test is not yet approved or cleared by the [place] FDA and has been authorized for detection and/or diagnosis of SARS-CoV-2 by FDA under an Emergency Use Authorization (EUA). This EUA will remain in effect (meaning this test can be used) for the duration of the COVID-19 declaration under Section 564(b)(1) of the Act, 21 U.S.C. section 360bbb-3(b)(1), unless the authorization is terminated or revoked.  Performed at [facility], [place] [address] [place], [place] [address]          Radiology Studies: No results  found.      Scheduled Meds:  amLODipine  2.5 mg Oral Daily   apixaban  5 mg Oral BID   dexamethasone (DECADRON) injection  6 mg Intravenous Q24H   furosemide  10 mg Oral Daily   gabapentin  400 mg Oral BID   insulin aspart  0-5 Units Subcutaneous QHS   insulin aspart  0-9 Units Subcutaneous TID WC   metoprolol succinate  12.5 mg Oral Daily   simvastatin  20 mg Oral QPM   Continuous Infusions:  ampicillin-sulbactam (UNASYN) IV 3 g (06/02/22 0455)          Aline August, MD [facility] 06/02/2022, 8:03 AM

## 2022-06-02 NOTE — Plan of Care (Signed)

## 2022-06-02 NOTE — Plan of Care (Signed)
  Problem: Education: Goal: Knowledge of General Education information will improve Description: Including pain rating scale, medication(s)/side effects and non-pharmacologic comfort measures 06/02/2022 1456 by Emmaline Life, RN Outcome: Progressing 06/02/2022 1456 by Emmaline Life, RN Outcome: Progressing

## 2022-06-02 NOTE — Consult Note (Signed)
ORTHOPAEDIC CONSULTATION  REQUESTING PHYSICIAN: Aline August, MD  Chief Complaint: Bilateral leg pain.  HPI: Richard Kent is a 89 y.o. male who presents with mixed arterial and venous insufficiency both legs with mild abrasions over the tibial crest bilaterally.  Past Medical History:  Diagnosis Date   Basal cell carcinoma 05/24/2022   Dermatopathology report from [facility] in [place], [place].   Left mid -upper back   Carotid stenosis    CVA (cerebral infarction) 2011   Diverticulosis    Hyperlipidemia    Hypertension    Kidney stone    Smoker    Stroke [facility]) 11/17/2009   Past Surgical History:  Procedure Laterality Date   APPENDECTOMY     HERNIA REPAIR     RIGHT   HIP ARTHROPLASTY  01/15/2012   Procedure: ARTHROPLASTY BIPOLAR HIP;  Surgeon: Rozanna Box, MD;  Location: [facility];  Service: Orthopedics;  Laterality: Right;   Social History   Socioeconomic History   Marital status: Married    Spouse name: [name]    Number of children: 5   Years of education: Not on file   Highest education level: Not on file  Occupational History   Occupation: retired    Fish farm manager: RETIRED  Tobacco Use   Smoking status: Former    Packs/day: 2.00    Years: 20.00    Total pack years: 40.00    Types: Cigarettes    Quit date: 01/13/1972    Years since quitting: 50.4   Smokeless tobacco: Never  Vaping Use   Vaping Use: Never used  Substance and Sexual Activity   Alcohol use: No    Comment: Patient quit alcohol 11/17/2009   Drug use: No   Sexual activity: Not Currently  Other Topics Concern   Not on file  Social History Narrative   Patient live at home with his wife. Patient is retired and has five children.    Social Determinants of Health   Financial Resource Strain: Low Risk  (09/09/2021)   Overall Financial Resource Strain (CARDIA)    Difficulty of Paying Living Expenses: Not hard at all  Food Insecurity: No Food Insecurity (05/30/2022)   Hunger Vital  Sign    Worried About Running Out of Food in the Last Year: Never true    Ran Out of Food in the Last Year: Never true  Transportation Needs: No Transportation Needs (05/30/2022)   PRAPARE - Hydrologist (Medical): No    Lack of Transportation (Non-Medical): No  Physical Activity: Sufficiently Active (09/09/2021)   Exercise Vital Sign    Days of Exercise per Week: 7 days    Minutes of Exercise per Session: 60 min  Stress: No Stress Concern Present (09/09/2021)   [place]    Feeling of Stress : Not at all  Social Connections: Not on file   Family History  Problem Relation Age of Onset   Cancer Mother    Stroke Mother    Heart disease Father    Cervical cancer Sister    Heart attack Sister    - negative except otherwise stated in the family history section No Known Allergies Prior to Admission medications   Medication Sig Start Date End Date Taking? Authorizing Provider  acetaminophen (TYLENOL) 500 MG tablet Take 500 mg by mouth every 6 (six) hours as needed for moderate pain.   Yes [provider]  amLODipine (NORVASC) 2.5 MG  tablet Take 1 tablet (2.5 mg total) by mouth daily. 09/19/21  Yes Denita Lung, MD  amoxicillin-clavulanate (AUGMENTIN) 875-125 MG tablet Take 1 tablet by mouth 2 (two) times daily. 05/19/22  Yes Denita Lung, MD  apixaban (ELIQUIS) 5 MG TABS tablet Take 1 tablet (5 mg total) by mouth 2 (two) times daily. 09/19/21  Yes Denita Lung, MD  Ascorbic Acid (VITAMIN C PO) Take by mouth.   Yes [provider]  furosemide (LASIX) 20 MG tablet Take 1/2 (one-half) tablet by mouth once daily Patient taking differently: Take 10 mg by mouth daily. 03/03/22  Yes Denita Lung, MD  gabapentin (NEURONTIN) 400 MG capsule Take 1 capsule (400 mg total) by mouth 2 (two) times daily. 05/02/22  Yes Denita Lung, MD  metoprolol succinate (TOPROL XL) 25 MG 24 hr  tablet Take 0.5 tablets (12.5 mg total) by mouth daily. Hold if systolic blood pressure (top blood pressure number) less than 100 mmHg or heart rate less than 60 bpm (pulse). 08/30/21 05/31/23 Yes Tolia, Sunit, DO  Multiple Vitamin (MULTIVITAMIN WITH MINERALS) TABS Take 1 tablet by mouth every morning. Reported on 09/06/2015   Yes [provider]  simvastatin (ZOCOR) 20 MG tablet Take 1 tablet (20 mg total) by mouth every evening. 09/19/21  Yes Denita Lung, MD  traMADol (ULTRAM) 50 MG tablet Take 2 tablets (100 mg total) by mouth every 8 (eight) hours as needed. 05/26/22 06/25/22 Yes Denita Lung, MD  VITAMIN D PO Take 1 tablet by mouth daily.   Yes [provider]  Blood Glucose Monitoring Suppl (ONE TOUCH ULTRA 2) w/Device KIT 1 each by Does not apply route daily. 01/18/22   Denita Lung, MD  glucose blood [facility] ULTRA) test strip Check blood sugar once daily 03/15/22   Denita Lung, MD  mupirocin ointment (BACTROBAN) 2 % Apply 1 Application topically 2 (two) times daily. 02/01/22   Denita Lung, MD  OneTouch Delica Lancets 16X MISC 1 each by Does not apply route daily. 04/12/22   Denita Lung, MD   No results found. - pertinent xrays, CT, MRI studies were reviewed and independently interpreted  Positive ROS: All other systems have been reviewed and were otherwise negative with the exception of those mentioned in the HPI and as above.  Physical Exam: General: Alert, no acute distress Psychiatric: Patient is competent for consent with normal mood and affect Lymphatic: No axillary or cervical lymphadenopathy Cardiovascular: No pedal edema Respiratory: No cyanosis, no use of accessory musculature GI: No organomegaly, abdomen is soft and non-tender    Images:  '@ENCIMAGES'$ @  Labs:  Lab Results  Component Value Date   HGBA1C 6.3 (A) 05/19/2022   HGBA1C 6.4 (H) 01/06/2022   HGBA1C 6.7 (H) 09/19/2021   CRP 1.6 (H) 06/02/2022   CRP 3.2 (H) 06/01/2022   CRP  4.3 (H) 05/30/2022   REPTSTATUS PENDING 05/29/2022   CULT  05/29/2022    NO GROWTH 4 DAYS Performed at [facility], [place] [address], [place], [place] [address]    LABORGA ESCHERICHIA COLI 06/04/2010    Lab Results  Component Value Date   ALBUMIN 2.7 (L) 06/02/2022   ALBUMIN 2.5 (L) 06/01/2022   ALBUMIN 3.2 (L) 05/29/2022        Latest Ref Rng & Units 06/02/2022    3:52 AM 06/01/2022    4:33 AM 05/30/2022    7:32 AM  CBC EXTENDED  WBC 4.0 - 10.5 K/uL 12.2  13.6  10.9   RBC 4.22 - 5.81 MIL/uL 3.73  3.65  3.82   Hemoglobin 13.0 - 17.0 g/dL 12.4  12.3  12.4   HCT 39.0 - 52.0 % 36.4  35.2  37.5   Platelets 150 - 400 K/uL 263  250  258   NEUT# 1.7 - 7.7 K/uL 10.5  11.5    Lymph# 0.7 - 4.0 K/uL 0.7  1.0      Neurologic: Patient does not have protective sensation bilateral lower extremities.   MUSCULOSKELETAL:   Skin: Examination patient has some mild ischemic partial-thickness abrasions over the tibial crest bilaterally.  Patient has good heel protection and there are no heel ulcers.  I cannot palpate a dorsalis pedis pulse bilaterally.  Cannot palpate any anterior tibial pulse.  There is no brawny skin color changes no venous stasis ulcers.  Review of patient's labs he has a white cell count of 12.2.  Albumin 2.7.  Hemoglobin A1c 6.3.  Assessment: Assessment: Mixed arterial and venous insufficiency both lower extremities.  Plan: Continue with heel protection continue with the mild compression.  I have ordered ankle-brachial indices for at baseline.  Thank you for the consult and the opportunity to see Mr. Yobani Schertzer, [place] [phone] 4:54 PM

## 2022-06-02 NOTE — TOC Progression Note (Addendum)
Transition of Care [facility]) - Progression Note    Patient Details  Name: Richard Kent MRN: [id number] Date of Birth: Dec 13, 1928  Transition of Care [facility]) CM/SW [place], [place] Phone Number: 06/02/2022, 12:00 PM  Clinical Narrative:    CSW followed up with the facilities that have offered to determine covid quarantine policy. [facility] and [facility] are seeing if they have a covid bed, if not pt will need to test negative on day 5 or be 11 days after the initial covid positive test. The other facilities have noted a 10- 11 day quarantine prior to admitting. CSW attempted to update pt's daughter, VM left. TOC will continue to follow for DC needs.   3:30 [name] called back and was updated.  Expected Discharge Plan: [place] Barriers to Discharge: Continued Medical Work up, SNF Pending bed offer  Expected Discharge Plan and Services     Post Acute Care Choice: [place] Living arrangements for the past 2 months: Single Family Home                                       Social Determinants of Health (SDOH) Interventions SDOH Screenings   Food Insecurity: No Food Insecurity (05/30/2022)  Housing: Low Risk  (05/30/2022)  Transportation Needs: No Transportation Needs (05/30/2022)  Utilities: Not At Risk (05/30/2022)  Depression (PHQ2-9): Low Risk  (09/09/2021)  Financial Resource Strain: Low Risk  (09/09/2021)  Physical Activity: Sufficiently Active (09/09/2021)  Stress: No Stress Concern Present (09/09/2021)  Tobacco Use: Medium Risk (05/31/2022)    Readmission Risk Interventions    09/28/2019    2:22 PM  Readmission Risk Prevention Plan  Post Dischage Appt Complete  Medication Screening Complete  Transportation Screening Complete

## 2022-06-03 ENCOUNTER — Inpatient Hospital Stay (HOSPITAL_COMMUNITY): Payer: Medicare Other

## 2022-06-03 DIAGNOSIS — L0390 Cellulitis, unspecified: Secondary | ICD-10-CM | POA: Diagnosis not present

## 2022-06-03 DIAGNOSIS — Z7189 Other specified counseling: Secondary | ICD-10-CM

## 2022-06-03 DIAGNOSIS — J189 Pneumonia, unspecified organism: Secondary | ICD-10-CM | POA: Diagnosis not present

## 2022-06-03 DIAGNOSIS — I739 Peripheral vascular disease, unspecified: Secondary | ICD-10-CM | POA: Diagnosis not present

## 2022-06-03 DIAGNOSIS — J9601 Acute respiratory failure with hypoxia: Secondary | ICD-10-CM | POA: Diagnosis not present

## 2022-06-03 DIAGNOSIS — U071 COVID-19: Secondary | ICD-10-CM | POA: Diagnosis not present

## 2022-06-03 LAB — CULTURE, BLOOD (ROUTINE X 2)
Culture: NO GROWTH
Culture: NO GROWTH
Special Requests: ADEQUATE

## 2022-06-03 LAB — GLUCOSE, CAPILLARY
Glucose-Capillary: 133 mg/dL — ABNORMAL HIGH (ref 70–99)
Glucose-Capillary: 115 mg/dL — ABNORMAL HIGH (ref 70–99)
Glucose-Capillary: 219 mg/dL — ABNORMAL HIGH (ref 70–99)
Glucose-Capillary: 156 mg/dL — ABNORMAL HIGH (ref 70–99)
Glucose-Capillary: 176 mg/dL — ABNORMAL HIGH (ref 70–99)

## 2022-06-03 LAB — VAS US ABI WITH/WO TBI
Left ABI: 0.36
Right ABI: 0.35

## 2022-06-03 NOTE — Consult Note (Signed)
Consultation Note Date: 06/03/2022   Patient Name: Richard Kent  DOB: 17-Aug-1928  MRN: [id number]  Age / Sex: 89 y.o., male  PCP: Richard Lung, MD Referring Physician: Aline August, MD  Reason for Consultation: Establishing goals of care  HPI/Patient Profile: 89 y.o. male  with past medical history of  CAD, hypertension, hyperlipidemia, nephrolithiasis, type 2 diabetes, paroxysmal A-fib on Eliquis, dementia, bilateral lower extremity lymphedema/venous stasis dermatitis admitted on 05/29/2022 with abdominal/right groin pain following an unwitnessed fall out of his bed.   Patient is admitted for acute hypoxemic respiratory failure secondary to COVID-19 infection and left lower lobe pneumonia, concerning for aspiration. PMT has been consulted to assist with goals of care conversation.  Clinical Assessment and Goals of Care:  I have reviewed medical records including EPIC notes, labs and imaging, assessed the patient and then met at the bedside with patient's wife, daughter, and grandson to discuss diagnosis prognosis, GOC, EOL wishes, disposition and options.  I introduced Palliative Medicine as specialized medical care for people living with serious illness. It focuses on providing relief from the symptoms and stress of a serious illness. The goal is to improve quality of life for both the patient and the family.  We discussed a brief life review of the patient and then focused on their current illness.  The natural disease trajectory and expectations at EOL were discussed.  I attempted to elicit values and goals of care important to the patient.    Medical History Review and Understanding:  Patient's family understand the severity of his acute illness.  They report having no conversations about his dementia progression.  Reviewed the signs and symptoms of end-stage dementia, including immobility, progressive  weakness, incontinence, dysphagia, recurrent infections, and worsening cognitive status.  I shared my professional opinion that it appears patient has already progressed to end-stage dementia.  They verbalized their understanding.  They state that he has never even been formally diagnosed.  Social History: Patient has been married to his wife Richard Kent for over 65 years.  They have very supportive children and grandchildren.  3 grandchildren had just moved into patient and wife's home about 5 months ago to assist with caregiving.  Wife was taking care of him since stroke in 2011.  He is Nurse, learning disability.  Functional and Nutritional State: Patient used to be able to ambulate with assistance, but is now bedbound and wheelchair-bound.  Albumin of 2.7 noted indicating poor nutritional status.  He is no longer able to do the things he used to enjoy.  Palliative Symptoms: Agitation/confusion, leg pain  Advance Directives: Patient has advanced directive on file from [place] 2023.  His wife Richard Kent is primary HCPOA and daughter Richard Kent is secondary HCPOA.  Code Status: Concepts specific to code status, artifical feeding and hydration, and rehospitalization were considered and discussed.  Patient has always been very clear he does not want aggressive measures to prolong his life.  Discussion: Patient's family are appreciative of an extra layer of support as they navigate treatment options and the best next step to care for Richard Kent.  They are all in agreement that they cannot continue to provide all of his caregiving needs and he is at the point in his life where he needs 24/7 care.  After discussion of his acute chronic illnesses, they share that he has suffered a lot already and would likely be inclined to lean towards comfort focused care if he continues to have difficulties despite medical interventions and therapies to prolong his life and  improve his functioning.  They are hoping for placement at [facility] for long-term  care after short-term rehab.  Noted that it is difficult to get patients into the facility.  He has been at [facility] before but they are less confident about the current status of the facility.  Patient has been much more sleepy lately and I shared that this is also expected in end-stage dementia, and patient may also be lethargic due to his acute infections.  Encouraged family to consider his quality of life moving forward in the decisions he would want to make if he were to understand his current situation.  Shared my concern that if patient begins to have recurrent complications and hospitalizations, he is at risk for increased suffering and possibly prolonging the dying process rather than his life.  They are agreeable to outpatient palliative care for ongoing discussions based on how he does at [facility] and then long-term care.  They understand that they would then be able to transition to hospice when they are ready.   The difference between aggressive medical intervention and comfort care was considered in light of the patient's goals of care. Hospice and Palliative Care services outpatient were explained and offered.   Discussed the importance of continued conversation with family and the medical providers regarding overall plan of care and treatment options, ensuring decisions are within the context of the patient's values and GOCs.   Questions and concerns were addressed.  Hard Choices booklet left for review. The family was encouraged to call with questions or concerns.  PMT will continue to support holistically.   SUMMARY OF RECOMMENDATIONS   -Continue DNR -Continue current care, patient would not want aggressive measures to prolong his life -Patient's family is interested in trial of SNF for rehabilitation, they are also looking into long-term care placement after SNF -TOC consulted for outpatient palliative care referral, assistance is appreciated -Family is willing to consider hospice if  he continues to decline and/or suffer with high symptom burden -Psychosocial and emotional support provided -PMT will continue to follow and support  Prognosis:  Poor long-term prognosis, high risk of further decline and appropriate for hospice  Discharge Planning: [facility] for rehab with Palliative care service follow-up      Primary Diagnoses: Present on Admission:  Pneumonia  Atrial fibrillation ([place])  Hyperlipidemia LDL goal <100  Hypertension  Type II diabetes mellitus with complication [facility])  Physical Exam Vitals and nursing note reviewed.  Constitutional:      General: He is not in acute distress.    Appearance: He is ill-appearing.     Interventions: Nasal cannula in place.     Comments: 2L  Cardiovascular:     Rate and Rhythm: Normal rate.  Pulmonary:     Effort: Pulmonary effort is normal. No respiratory distress.  Neurological:     Mental Status: He is lethargic and disoriented.    Vital Signs: BP (!) 153/62   Pulse 69   Temp 97.7 F (36.5 C) (Oral)   Resp 19   Ht 6' (1.829 m)   Wt 84.9 kg   SpO2 97%   BMI 25.38 kg/m  Pain Scale: 0-10   Pain Score: 0-No pain   SpO2: SpO2: 97 % O2 Device:SpO2: 97 % O2 Flow Rate: .O2 Flow Rate (L/min): 2 L/min   MDM: high    Julyssa Kyer Johnnette Litter, PA-C  Palliative Medicine Team Team phone # [phone]  Thank you for allowing the Palliative Medicine Team to assist in  the care of this patient. Please utilize secure chat with additional questions, if there is no response within 30 minutes please call the above phone number.  Palliative Medicine Team providers are available by phone from 7am to 7pm daily and can be reached through the team cell phone.  Should this patient require assistance outside of these hours, please call the patient's attending physician.

## 2022-06-03 NOTE — Progress Notes (Signed)
PROGRESS NOTE    Richard Kent  POE:[id number] DOB: May 05, 1929 DOA: 05/29/2022 PCP: Denita Lung, MD   Brief Narrative:   89 y.o. male with medical history significant of CAD, hypertension, hyperlipidemia, nephrolithiasis, type 2 diabetes, paroxysmal A-fib on Eliquis, dementia, bilateral lower extremity lymphedema presented to the ED with abdominal/right groin pain following an unwitnessed fall out of his bed a day prior to presentation.  On presentation, he was hypoxic in the 80s on room air requiring supplemental oxygen.  CTA chest was negative for PE but showed developing infiltrate or aspiration in the left lower lobe. CT abdomen pelvis showing probable recently passed 2 mm stone within the urinary bladder and no hydronephrosis. CT head/C-spine/T-spine/L-spine, x-ray of right hip/pelvis negative for acute traumatic injury.  He was started on IV antibiotics.  He subsequently tested positive for COVID-19 and was started on remdesivir and Decadron as well.  Assessment & Plan:   Acute respiratory failure with hypoxia COVID-19 infection Left lower lobe pneumonia: COVID 19 pneumonia with possibly combination of aspiration pneumonia -Currently on room air.  Wean off as able.  Blood cultures negative so far. -Treated with Unasyn for 5 days.  DC antibiotics today.  Diet as per SLP recommendations -COVID-19 Labs  Recent Labs    06/01/22 0433 06/02/22 0352  CRP 3.2* 1.6*     Lab Results  Component Value Date   SARSCOV2NAA POSITIVE (A) 05/30/2022   [place] NEGATIVE 09/27/2019   -Monitor daily inflammatory markers -Currently on Decadron, finish 10-day course.  Completed remdesivir -Continue isolation  CAD -Stable.  No chest pain.  Continue metoprolol succinate and statin.  Outpatient follow-up with cardiology  Chronic bilateral lower extremity edema/lymphedema Gangrenous changes of toes of left foot -Follows up with Dr. Sharol Given as an outpatient: Last clinic visit was a week prior to  presentation.   -Dr. Sharol Given following: ABIs pending. -Continue oral Lasix  Hypertension -Continue amlodipine, Lasix and metoprolol succinate  Leukocytosis -Mild.  Monitor intermittently.  Diabetes mellitus type 2 with neuropathy -A1c 6.3 on 05/19/2022 -continue CBGs with SSI -Continue gabapentin  Paroxysmal A-fib -Rate controlled.  Continue Eliquis and metoprolol succinate  Dementia -Fall and delirium precautions.   -Consult palliative care for goals of care discussion  Physical deconditioning -PT recommends SNF placement.  TOC consulted.  DVT prophylaxis: Eliquis Code Status: DNR Family Communication: None at bedside Disposition Plan: Status is: Inpatient Of severity of illness.  Need for SNF placement   Consultants: Orthopedics/Dr. Sharol Given  Procedures: None  Antimicrobials:  Anti-infectives (From admission, onward)    Start     Dose/Rate Route Frequency Ordered Stop   05/31/22 1000  remdesivir 100 mg in sodium chloride 0.9 % 100 mL IVPB       See Hyperspace for full Linked Orders Report.   100 mg 200 mL/hr over 30 Minutes Intravenous Daily 05/30/22 0807 06/01/22 1055   05/30/22 0900  remdesivir 200 mg in sodium chloride 0.9% 250 mL IVPB       See Hyperspace for full Linked Orders Report.   200 mg 580 mL/hr over 30 Minutes Intravenous Once 05/30/22 0807 05/30/22 1220   05/30/22 0400  Ampicillin-Sulbactam (UNASYN) 3 g in sodium chloride 0.9 % 100 mL IVPB        3 g 200 mL/hr over 30 Minutes Intravenous Every 6 hours 05/30/22 0333     05/29/22 2230  cefTRIAXone (ROCEPHIN) 1 g in sodium chloride 0.9 % 100 mL IVPB        1 g 200 mL/hr over  30 Minutes Intravenous  Once 05/29/22 2227 05/29/22 2316   05/29/22 2230  azithromycin (ZITHROMAX) 500 mg in sodium chloride 0.9 % 250 mL IVPB        500 mg 250 mL/hr over 60 Minutes Intravenous  Once 05/29/22 2227 05/30/22 0022         Subjective: Patient seen and examined at bedside.  Confused, still slow to respond and a  very poor historian.  No fever, seizures, agitation or vomiting reported.   Objective: Vitals:   06/02/22 1739 06/02/22 1950 06/03/22 0336 06/03/22 0500  BP: (!) 154/73 134/77 (!) 174/86   Pulse: 69 78 88   Resp: '16 17 19   '$ Temp: (!) 97.5 F (36.4 C) 97.7 F (36.5 C) 97.9 F (36.6 C)   TempSrc: Oral Oral Oral   SpO2: 92% 91% 92%   Weight:    84.9 kg  Height:        Intake/Output Summary (Last 24 hours) at 06/03/2022 0757 Last data filed at 06/03/2022 0354 Gross per 24 hour  Intake 240 ml  Output 1400 ml  Net -1160 ml    Filed Weights   06/01/22 0419 06/02/22 0500 06/03/22 0500  Weight: 84.5 kg 84.5 kg 84.9 kg    Examination:  General: On room air.  No distress.  Looks chronically ill and deconditioned ENT/neck: No obvious JVD elevation or palpable thyromegaly noted respiratory: Decreased breath sounds at bases bilaterally with some crackles  CVS: S1 and S2 are heard; rate controlled currently  abdominal: Soft, nontender, slightly distended; no organomegaly, bowel sounds are heard  extremities: Trace lower extremity edema present; no cyanosis CNS: Confused; still very slow to respond.  Poor historian.  Poor historian.  No focal neurologic deficit.  Still able to move extremities lymph: No palpable lymphadenopathy  skin: Gangrenous changes of left great toe and left third toe present psych: Currently showing no signs of agitation with extremely flat affect musculoskeletal: No obvious other joint erythema/tenderness/swelling     Data Reviewed: I have personally reviewed following labs and imaging studies  CBC: Recent Labs  Lab 05/29/22 0902 05/30/22 0732 06/01/22 0433 06/02/22 0352  WBC 11.3* 10.9* 13.6* 12.2*  NEUTROABS 8.6*  --  11.5* 10.5*  HGB 13.4 12.4* 12.3* 12.4*  HCT 40.2 37.5* 35.2* 36.4*  MCV 98.8 98.2 96.4 97.6  PLT 278 258 250 419    Basic Metabolic Panel: Recent Labs  Lab 05/29/22 0902 06/01/22 0433 06/02/22 0352  NA 137 137 134*  K 4.2  3.9 4.0  CL 103 103 104  CO2 '23 23 22  '$ GLUCOSE 114* 107* 139*  BUN '16 20 21  '$ CREATININE 0.86 1.01 0.95  CALCIUM 8.6* 8.0* 7.9*  MG 2.1 2.1 2.1    GFR: Estimated Creatinine Clearance: 55.6 mL/min (by C-G formula based on SCr of 0.95 mg/dL). Liver Function Tests: Recent Labs  Lab 05/29/22 0902 06/01/22 0433 06/02/22 0352  AST 36 26 33  ALT 28 27 42  ALKPHOS 66 49 49  BILITOT 0.9 0.8 0.6  PROT 6.6 5.7* 5.8*  ALBUMIN 3.2* 2.5* 2.7*    No results for input(s): "LIPASE", "AMYLASE" in the last 168 hours. No results for input(s): "AMMONIA" in the last 168 hours. Coagulation Profile: No results for input(s): "INR", "PROTIME" in the last 168 hours. Cardiac Enzymes: No results for input(s): "CKTOTAL", "CKMB", "CKMBINDEX", "TROPONINI" in the last 168 hours. BNP (last 3 results) No results for input(s): "PROBNP" in the last 8760 hours. HbA1C: No results for input(s): "HGBA1C" in  the last 72 hours. CBG: Recent Labs  Lab 06/02/22 1123 06/02/22 1655 06/02/22 1946 06/02/22 2343 06/03/22 0333  GLUCAP 137* 154* 214* 176* 133*    Lipid Profile: No results for input(s): "CHOL", "HDL", "LDLCALC", "TRIG", "CHOLHDL", "LDLDIRECT" in the last 72 hours. Thyroid Function Tests: No results for input(s): "TSH", "T4TOTAL", "FREET4", "T3FREE", "THYROIDAB" in the last 72 hours. Anemia Panel: No results for input(s): "VITAMINB12", "FOLATE", "FERRITIN", "TIBC", "IRON", "RETICCTPCT" in the last 72 hours.  Sepsis Labs: Recent Labs  Lab 05/29/22 1250 05/31/22 0812 06/01/22 0433 06/02/22 0352  PROCALCITON  --  <0.10 <0.10 <0.10  LATICACIDVEN 1.9  --   --   --      Recent Results (from the past 240 hour(s))  Blood Culture (routine x 2)     Status: None   Collection Time: 05/29/22 12:35 PM   Specimen: BLOOD LEFT HAND  Result Value Ref Range Status   Specimen Description BLOOD LEFT HAND  Final   Special Requests   Final    BOTTLES DRAWN AEROBIC AND ANAEROBIC Blood Culture adequate  volume   Culture   Final    NO GROWTH 5 DAYS Performed at [facility], [place] [address], [place], [place] [address]    Report Status 06/03/2022 FINAL  Final  Blood Culture (routine x 2)     Status: None   Collection Time: 05/29/22 12:42 PM   Specimen: BLOOD RIGHT HAND  Result Value Ref Range Status   Specimen Description BLOOD RIGHT HAND  Final   Special Requests   Final    BOTTLES DRAWN AEROBIC ONLY Blood Culture results may not be optimal due to an inadequate volume of blood received in culture bottles   Culture   Final    NO GROWTH 5 DAYS Performed at [facility], [place] [address], [place], [place] [address]    Report Status 06/03/2022 FINAL  Final  Resp panel by RT-PCR (RSV, Flu A&B, Covid) Anterior Nasal Swab     Status: Abnormal   Collection Time: 05/30/22  4:00 AM   Specimen: Anterior Nasal Swab  Result Value Ref Range Status   SARS Coronavirus 2 by RT PCR POSITIVE (A) NEGATIVE Final    Comment: (NOTE) SARS-CoV-2 target nucleic acids are DETECTED.  The SARS-CoV-2 RNA is generally detectable in upper respiratory specimens during the acute phase of infection. Positive results are indicative of the presence of the identified virus, but do not rule out bacterial infection or co-infection with other pathogens not detected by the test. Clinical correlation with patient history and other diagnostic information is necessary to determine patient infection status. The expected result is Negative.  Fact Sheet for Patients: EntrepreneurPulse.com.au  Fact Sheet for Healthcare Providers: IncredibleEmployment.be  This test is not yet approved or cleared by the [place] FDA and  has been authorized for detection and/or diagnosis of SARS-CoV-2 by FDA under an Emergency Use Authorization (EUA).  This EUA will remain in effect (meaning this test can be used) for the duration of  the COVID-19 declaration under Section 564(b)(1) of the A  ct, 21 U.S.C. section 360bbb-3(b)(1), unless the authorization is terminated or revoked sooner.     Influenza A by PCR NEGATIVE NEGATIVE Final   Influenza B by PCR NEGATIVE NEGATIVE Final    Comment: (NOTE) The Xpert Xpress SARS-CoV-2/FLU/RSV plus assay is intended as an aid in the diagnosis of influenza from Nasopharyngeal swab specimens and should not be used as a sole basis for treatment. Nasal washings  and aspirates are unacceptable for Xpert Xpress SARS-CoV-2/FLU/RSV testing.  Fact Sheet for Patients: EntrepreneurPulse.com.au  Fact Sheet for Healthcare Providers: IncredibleEmployment.be  This test is not yet approved or cleared by the [place] FDA and has been authorized for detection and/or diagnosis of SARS-CoV-2 by FDA under an Emergency Use Authorization (EUA). This EUA will remain in effect (meaning this test can be used) for the duration of the COVID-19 declaration under Section 564(b)(1) of the Act, 21 U.S.C. section 360bbb-3(b)(1), unless the authorization is terminated or revoked.     Resp Syncytial Virus by PCR NEGATIVE NEGATIVE Final    Comment: (NOTE) Fact Sheet for Patients: EntrepreneurPulse.com.au  Fact Sheet for Healthcare Providers: IncredibleEmployment.be  This test is not yet approved or cleared by the [place] FDA and has been authorized for detection and/or diagnosis of SARS-CoV-2 by FDA under an Emergency Use Authorization (EUA). This EUA will remain in effect (meaning this test can be used) for the duration of the COVID-19 declaration under Section 564(b)(1) of the Act, 21 U.S.C. section 360bbb-3(b)(1), unless the authorization is terminated or revoked.  Performed at [facility], [place] [address] [place], [place] [address]          Radiology Studies: No results found.      Scheduled Meds:  amLODipine  2.5 mg Oral Daily   apixaban  5 mg Oral  BID   dexamethasone (DECADRON) injection  6 mg Intravenous Q24H   furosemide  10 mg Oral Daily   gabapentin  400 mg Oral BID   insulin aspart  0-5 Units Subcutaneous QHS   insulin aspart  0-9 Units Subcutaneous TID WC   metoprolol succinate  12.5 mg Oral Daily   simvastatin  20 mg Oral QPM   Continuous Infusions:  ampicillin-sulbactam (UNASYN) IV 3 g (06/03/22 0503)          Aline August, MD [facility] 06/03/2022, 7:57 AM

## 2022-06-03 NOTE — Progress Notes (Signed)
VASCULAR LAB    ABI has been performed.  See CV proc for preliminary results.   Anis Cinelli, RVT 06/03/2022, 2:39 PM

## 2022-06-04 DIAGNOSIS — Z7189 Other specified counseling: Secondary | ICD-10-CM | POA: Diagnosis not present

## 2022-06-04 DIAGNOSIS — J189 Pneumonia, unspecified organism: Secondary | ICD-10-CM | POA: Diagnosis not present

## 2022-06-04 DIAGNOSIS — I739 Peripheral vascular disease, unspecified: Secondary | ICD-10-CM | POA: Diagnosis not present

## 2022-06-04 DIAGNOSIS — J9601 Acute respiratory failure with hypoxia: Secondary | ICD-10-CM | POA: Diagnosis not present

## 2022-06-04 DIAGNOSIS — U071 COVID-19: Secondary | ICD-10-CM | POA: Diagnosis not present

## 2022-06-04 LAB — GLUCOSE, CAPILLARY
Glucose-Capillary: 138 mg/dL — ABNORMAL HIGH (ref 70–99)
Glucose-Capillary: 114 mg/dL — ABNORMAL HIGH (ref 70–99)
Glucose-Capillary: 156 mg/dL — ABNORMAL HIGH (ref 70–99)
Glucose-Capillary: 265 mg/dL — ABNORMAL HIGH (ref 70–99)
Glucose-Capillary: 226 mg/dL — ABNORMAL HIGH (ref 70–99)

## 2022-06-04 NOTE — Progress Notes (Signed)
PROGRESS NOTE    Richard Kent  BPZ:[id number] DOB: July 23, 1928 DOA: 05/29/2022 PCP: Denita Lung, MD   Brief Narrative:   89 y.o. male with medical history significant of CAD, hypertension, hyperlipidemia, nephrolithiasis, type 2 diabetes, paroxysmal A-fib on Eliquis, dementia, bilateral lower extremity lymphedema presented to the ED with abdominal/right groin pain following an unwitnessed fall out of his bed a day prior to presentation.  On presentation, he was hypoxic in the 80s on room air requiring supplemental oxygen.  CTA chest was negative for PE but showed developing infiltrate or aspiration in the left lower lobe. CT abdomen pelvis showing probable recently passed 2 mm stone within the urinary bladder and no hydronephrosis. CT head/C-spine/T-spine/L-spine, x-ray of right hip/pelvis negative for acute traumatic injury.  He was started on IV antibiotics.  He subsequently tested positive for COVID-19 and was started on remdesivir and Decadron as well.  Assessment & Plan:   Acute respiratory failure with hypoxia COVID-19 infection Left lower lobe pneumonia: COVID 19 pneumonia with possibly combination of aspiration pneumonia -Currently on room air.  Wean off as able.  Blood cultures negative so far. -Treated with Unasyn and completed course.  Diet as per SLP recommendations -COVID-19 Labs  Recent Labs    06/02/22 0352  CRP 1.6*     Lab Results  Component Value Date   SARSCOV2NAA POSITIVE (A) 05/30/2022   [place] NEGATIVE 09/27/2019   -Monitor daily inflammatory markers -Currently on Decadron, finish 10-day course.  Completed remdesivir -Continue isolation  CAD -Stable.  No chest pain.  Continue metoprolol succinate and statin.  Outpatient follow-up with cardiology  Chronic bilateral lower extremity edema/lymphedema Gangrenous changes of toes of left foot/peripheral arterial disease -Follows up with Dr. Sharol Given as an outpatient: Last clinic visit was a week prior to  presentation.   -Dr. Sharol Given following: ABIs showed severe right and lower extremity arterial disease.  Follow recommendations. -Continue oral Lasix  Hypertension -Continue amlodipine, Lasix and metoprolol succinate  Leukocytosis -Mild.  Monitor intermittently.  Diabetes mellitus type 2 with neuropathy -A1c 6.3 on 05/19/2022 -continue CBGs with SSI -Continue gabapentin  Paroxysmal A-fib -Rate controlled.  Continue Eliquis and metoprolol succinate  Dementia -Fall and delirium precautions.   -Palliative care following.  Physical deconditioning -PT recommends SNF placement.  TOC consulted.  DVT prophylaxis: Eliquis Code Status: DNR Family Communication: None at bedside Disposition Plan: Status is: Inpatient Of severity of illness.  Need for SNF placement   Consultants: Orthopedics/Dr. Sharol Given  Procedures: None  Antimicrobials:  Anti-infectives (From admission, onward)    Start     Dose/Rate Route Frequency Ordered Stop   05/31/22 1000  remdesivir 100 mg in sodium chloride 0.9 % 100 mL IVPB       See Hyperspace for full Linked Orders Report.   100 mg 200 mL/hr over 30 Minutes Intravenous Daily 05/30/22 0807 06/01/22 1055   05/30/22 0900  remdesivir 200 mg in sodium chloride 0.9% 250 mL IVPB       See Hyperspace for full Linked Orders Report.   200 mg 580 mL/hr over 30 Minutes Intravenous Once 05/30/22 0807 05/30/22 1220   05/30/22 0400  Ampicillin-Sulbactam (UNASYN) 3 g in sodium chloride 0.9 % 100 mL IVPB        3 g 200 mL/hr over 30 Minutes Intravenous Every 6 hours 05/30/22 0333 06/03/22 2311   05/29/22 2230  cefTRIAXone (ROCEPHIN) 1 g in sodium chloride 0.9 % 100 mL IVPB        1 g 200 mL/hr over  30 Minutes Intravenous  Once 05/29/22 2227 05/29/22 2316   05/29/22 2230  azithromycin (ZITHROMAX) 500 mg in sodium chloride 0.9 % 250 mL IVPB        500 mg 250 mL/hr over 60 Minutes Intravenous  Once 05/29/22 2227 05/30/22 0022         Subjective: Patient seen and  examined at bedside.  Still confused and a poor historian.  No seizures, vomiting or agitation reported.   Objective: Vitals:   06/03/22 1557 06/03/22 2152 06/04/22 0541 06/04/22 0802  BP: (!) 142/73 134/63 (!) 157/99 (!) 160/103  Pulse: 61 64 65 (!) 51  Resp: '14 16 17 16  '$ Temp: (!) 97.5 F (36.4 C) 98.4 F (36.9 C) 98.7 F (37.1 C) 97.6 F (36.4 C)  TempSrc: Oral Oral Oral Axillary  SpO2: 94% 92% 98% 97%  Weight:      Height:        Intake/Output Summary (Last 24 hours) at 06/04/2022 0806 Last data filed at 06/03/2022 1855 Gross per 24 hour  Intake 360 ml  Output 500 ml  Net -140 ml    Filed Weights   06/01/22 0419 06/02/22 0500 06/03/22 0500  Weight: 84.5 kg 84.5 kg 84.9 kg    Examination:  General: No acute distress.  Currently on room air.  Looks chronically ill and deconditioned ENT/neck: No obvious neck masses or JVD elevation noted  respiratory: Bilateral decreased breath sounds at bases with some scattered crackles CVS: Mild intermittent bradycardia present; S1-S2 heard abdominal: Soft, nontender, distended slightly; no organomegaly, normal bowel sounds heard  extremities: No clubbing; mild lower extremity edema present bilaterally  CNS: Still extremely slow to respond and confused.  Poor historian.  No focal neurologic deficit.  Can move extremities  lymph: No obvious lymphadenopathy palpable  skin: Gangrenous changes of left great toe and left third toe present psych: Flat affect.  Not agitated  musculoskeletal: No obvious other joint erythema/deformity    Data Reviewed: I have personally reviewed following labs and imaging studies  CBC: Recent Labs  Lab 05/29/22 0902 05/30/22 0732 06/01/22 0433 06/02/22 0352  WBC 11.3* 10.9* 13.6* 12.2*  NEUTROABS 8.6*  --  11.5* 10.5*  HGB 13.4 12.4* 12.3* 12.4*  HCT 40.2 37.5* 35.2* 36.4*  MCV 98.8 98.2 96.4 97.6  PLT 278 258 250 262    Basic Metabolic Panel: Recent Labs  Lab 05/29/22 0902 06/01/22 0433  06/02/22 0352  NA 137 137 134*  K 4.2 3.9 4.0  CL 103 103 104  CO2 '23 23 22  '$ GLUCOSE 114* 107* 139*  BUN '16 20 21  '$ CREATININE 0.86 1.01 0.95  CALCIUM 8.6* 8.0* 7.9*  MG 2.1 2.1 2.1    GFR: Estimated Creatinine Clearance: 55.6 mL/min (by C-G formula based on SCr of 0.95 mg/dL). Liver Function Tests: Recent Labs  Lab 05/29/22 0902 06/01/22 0433 06/02/22 0352  AST 36 26 33  ALT 28 27 42  ALKPHOS 66 49 49  BILITOT 0.9 0.8 0.6  PROT 6.6 5.7* 5.8*  ALBUMIN 3.2* 2.5* 2.7*    No results for input(s): "LIPASE", "AMYLASE" in the last 168 hours. No results for input(s): "AMMONIA" in the last 168 hours. Coagulation Profile: No results for input(s): "INR", "PROTIME" in the last 168 hours. Cardiac Enzymes: No results for input(s): "CKTOTAL", "CKMB", "CKMBINDEX", "TROPONINI" in the last 168 hours. BNP (last 3 results) No results for input(s): "PROBNP" in the last 8760 hours. HbA1C: No results for input(s): "HGBA1C" in the last 72 hours. CBG: Recent  Labs  Lab 06/03/22 1121 06/03/22 1551 06/03/22 2249 06/04/22 0512 06/04/22 0800  GLUCAP 219* 156* 176* 138* 114*    Lipid Profile: No results for input(s): "CHOL", "HDL", "LDLCALC", "TRIG", "CHOLHDL", "LDLDIRECT" in the last 72 hours. Thyroid Function Tests: No results for input(s): "TSH", "T4TOTAL", "FREET4", "T3FREE", "THYROIDAB" in the last 72 hours. Anemia Panel: No results for input(s): "VITAMINB12", "FOLATE", "FERRITIN", "TIBC", "IRON", "RETICCTPCT" in the last 72 hours.  Sepsis Labs: Recent Labs  Lab 05/29/22 1250 05/31/22 0812 06/01/22 0433 06/02/22 0352  PROCALCITON  --  <0.10 <0.10 <0.10  LATICACIDVEN 1.9  --   --   --      Recent Results (from the past 240 hour(s))  Blood Culture (routine x 2)     Status: None   Collection Time: 05/29/22 12:35 PM   Specimen: BLOOD LEFT HAND  Result Value Ref Range Status   Specimen Description BLOOD LEFT HAND  Final   Special Requests   Final    BOTTLES DRAWN AEROBIC  AND ANAEROBIC Blood Culture adequate volume   Culture   Final    NO GROWTH 5 DAYS Performed at [facility], [place] [address], [place], [place] [address]    Report Status 06/03/2022 FINAL  Final  Blood Culture (routine x 2)     Status: None   Collection Time: 05/29/22 12:42 PM   Specimen: BLOOD RIGHT HAND  Result Value Ref Range Status   Specimen Description BLOOD RIGHT HAND  Final   Special Requests   Final    BOTTLES DRAWN AEROBIC ONLY Blood Culture results may not be optimal due to an inadequate volume of blood received in culture bottles   Culture   Final    NO GROWTH 5 DAYS Performed at [facility], [place] [address] [place],  [address]    Report Status 06/03/2022 FINAL  Final  Resp panel by RT-PCR (RSV, Flu A&B, Covid) Anterior Nasal Swab     Status: Abnormal   Collection Time: 05/30/22  4:00 AM   Specimen: Anterior Nasal Swab  Result Value Ref Range Status   SARS Coronavirus 2 by RT PCR POSITIVE (A) NEGATIVE Final    Comment: (NOTE) SARS-CoV-2 target nucleic acids are DETECTED.  The SARS-CoV-2 RNA is generally detectable in upper respiratory specimens during the acute phase of infection. Positive results are indicative of the presence of the identified virus, but do not rule out bacterial infection or co-infection with other pathogens not detected by the test. Clinical correlation with patient history and other diagnostic information is necessary to determine patient infection status. The expected result is Negative.  Fact Sheet for Patients: EntrepreneurPulse.com.au  Fact Sheet for Healthcare Providers: IncredibleEmployment.be  This test is not yet approved or cleared by the [place] FDA and  has been authorized for detection and/or diagnosis of SARS-CoV-2 by FDA under an Emergency Use Authorization (EUA).  This EUA will remain in effect (meaning this test can be used) for the duration of  the COVID-19  declaration under Section 564(b)(1) of the A ct, 21 U.S.C. section 360bbb-3(b)(1), unless the authorization is terminated or revoked sooner.     Influenza A by PCR NEGATIVE NEGATIVE Final   Influenza B by PCR NEGATIVE NEGATIVE Final    Comment: (NOTE) The Xpert Xpress SARS-CoV-2/FLU/RSV plus assay is intended as an aid in the diagnosis of influenza from Nasopharyngeal swab specimens and should not be used as a sole basis for treatment. Nasal washings and aspirates are unacceptable for Xpert  Xpress SARS-CoV-2/FLU/RSV testing.  Fact Sheet for Patients: EntrepreneurPulse.com.au  Fact Sheet for Healthcare Providers: IncredibleEmployment.be  This test is not yet approved or cleared by the [place] FDA and has been authorized for detection and/or diagnosis of SARS-CoV-2 by FDA under an Emergency Use Authorization (EUA). This EUA will remain in effect (meaning this test can be used) for the duration of the COVID-19 declaration under Section 564(b)(1) of the Act, 21 U.S.C. section 360bbb-3(b)(1), unless the authorization is terminated or revoked.     Resp Syncytial Virus by PCR NEGATIVE NEGATIVE Final    Comment: (NOTE) Fact Sheet for Patients: EntrepreneurPulse.com.au  Fact Sheet for Healthcare Providers: IncredibleEmployment.be  This test is not yet approved or cleared by the [place] FDA and has been authorized for detection and/or diagnosis of SARS-CoV-2 by FDA under an Emergency Use Authorization (EUA). This EUA will remain in effect (meaning this test can be used) for the duration of the COVID-19 declaration under Section 564(b)(1) of the Act, 21 U.S.C. section 360bbb-3(b)(1), unless the authorization is terminated or revoked.  Performed at [facility], [place] [address], [place], [place] [address]          Radiology Studies: VAS [place] ABI WITH/WO TBI  Result Date: 06/03/2022  LOWER  EXTREMITY DOPPLER STUDY Patient Name:  KENLY XIAO  Date of Exam:   06/03/2022 Medical Rec #: [id number]      Accession #:    [id number] Date of Birth: 15-May-1929      Patient Gender: M Patient Age:   32 years Exam Location[facility]  [facility] Procedure:      VAS [place] ABI WITH/WO TBI Referring Phys: MARCUS DUDA --------------------------------------------------------------------------------  Indications: Ulceration, non palpable pulses. High Risk Factors: Prior CVA (2011). Other Factors: Covid positive.  Limitations: Today's exam was limited due to involuntary patient movement and              patient with dementia and sleeping. Comparison Study: No prior study Performing Technologist: Sharion Dove RVS  Examination Guidelines: A complete evaluation includes at minimum, Doppler waveform signals and systolic blood pressure reading at the level of bilateral brachial, anterior tibial, and posterior tibial arteries, when vessel segments are accessible. Bilateral testing is considered an integral part of a complete examination. Photoelectric Plethysmograph (PPG) waveforms and toe systolic pressure readings are included as required and additional duplex testing as needed. Limited examinations for reoccurring indications may be performed as noted.  ABI Findings: +---------+------------------+-----+-------------------+--------+ Right    Rt Pressure (mmHg)IndexWaveform           Comment  +---------+------------------+-----+-------------------+--------+ Brachial 153                    multiphasic                 +---------+------------------+-----+-------------------+--------+ PTA      53                0.35 dampened monophasic         +---------+------------------+-----+-------------------+--------+ DP       39                0.25 dampened monophasic         +---------+------------------+-----+-------------------+--------+ Great Toe                       Absent                       +---------+------------------+-----+-------------------+--------+ +---------+------------------+-----+-------------------+-------+ Left  Lt Pressure (mmHg)IndexWaveform           Comment +---------+------------------+-----+-------------------+-------+ PTA      55                0.36 dampened monophasic        +---------+------------------+-----+-------------------+-------+ DP       12                0.08 dampened monophasic        +---------+------------------+-----+-------------------+-------+ Great Toe                       Absent                     +---------+------------------+-----+-------------------+-------+ +-------+-----------+-----------+------------+------------+ ABI/TBIToday's ABIToday's TBIPrevious ABIPrevious TBI +-------+-----------+-----------+------------+------------+ Right  0.35       absent                              +-------+-----------+-----------+------------+------------+ Left   0.36       absent                              +-------+-----------+-----------+------------+------------+  Summary: Right: Resting right ankle-brachial index indicates severe right lower extremity arterial disease. The right toe-brachial index is abnormal. Left: Resting left ankle-brachial index indicates severe left lower extremity arterial disease. The left toe-brachial index is abnormal. *See table(s) above for measurements and observations.  Electronically signed by Monica Martinez MD on 06/03/2022 at 4:03:24 PM.    Final         Scheduled Meds:  amLODipine  2.5 mg Oral Daily   apixaban  5 mg Oral BID   dexamethasone (DECADRON) injection  6 mg Intravenous Q24H   furosemide  10 mg Oral Daily   gabapentin  400 mg Oral BID   insulin aspart  0-5 Units Subcutaneous QHS   insulin aspart  0-9 Units Subcutaneous TID WC   metoprolol succinate  12.5 mg Oral Daily   simvastatin  20 mg Oral QPM   Continuous Infusions:          Aline August,  MD [facility] 06/04/2022, 8:06 AM

## 2022-06-04 NOTE — Progress Notes (Signed)
Daily Progress Note   Patient Name: Richard Kent       Date: 06/04/2022 DOB: 1928-08-18  Age: 89 y.o. MRN#: [id number] Attending Physician: Aline August, MD Primary Care Physician: Denita Lung, MD Admit Date: 05/29/2022  Reason for Consultation/Follow-up: Establishing goals of care  Subjective: AM: Medical records reviewed including progress notes, labs, imaging.  Patient assessed at the bedside.  He is more alert today.  Denies any complaints.  No family present during my visit.  Attempted to call patient's wife Richard Kent but was unable to reach.  Voicemail was left with PMT contact information.  I then called patient's daughter Richard Kent, made plans to meet at the bedside at 2 PM.  PM: Returned the bedside for scheduled family meeting.  Family elected to discuss in conference room to avoid alarming patient.  I provided update on ABI results showing bilateral severe arterial disease.  We discussed that he is not a good surgical candidate.  I shared my concern that given these findings, attempting to put patient through rehabilitation would likely just exacerbate his pain and worsen his quality of life rather than improve it.  They are in agreement and no longer want to pursue SNF for rehab.  We discussed that prognosis may be weeks to months, as he had no ulcers or skin changes outpatient and PAD already seems to have progressed rapidly.  Hospice philosophy was then explained in detail.  We discussed the different facilities that provide hospice care, adding that he may not be eligible for hospice facility quite yet.  They would pursue long-term care with hospice directly from this admission and would appreciate TOC assistance in navigating this.  Patient does not have Medicaid, but does have some VA  services that family is looking into.  Inpatient comfort care was also explained in detail.  Reviewed that patient would no longer receive aggressive medical interventions such as continuous vital signs, lab work, radiology testing, or medications not focused on comfort. All care would focus on how the patient is looking and feeling. This would include management of any symptoms that may cause discomfort, pain, shortness of breath, cough, nausea, agitation, anxiety, and/or secretions etc. Symptoms would be managed with medications and other non-pharmacological interventions such as spiritual support if requested, repositioning, music therapy, or therapeutic listening. Family verbalized understanding and appreciation.  They would likely want to transition to comfort care soon, however first wish to discuss with more family members tonight before making a final decision.  We discussed that it seems he is rallying today and may soon be just as lethargic as he was yesterday.  Reviewed that transition to comfort care may provide further clarity about patient's overall prognosis and they verbalized understanding.   Questions and concerns addressed. PMT will continue to support holistically.   Length of Stay: 6  Physical Exam Vitals and nursing note reviewed.  Constitutional:      General: He is not in acute distress.    Appearance: He is ill-appearing.  Cardiovascular:     Rate and Rhythm: Bradycardia present.  Pulmonary:     Effort: Pulmonary effort is normal.  Neurological:     Mental Status: He is alert. He is disoriented.  Psychiatric:        Behavior: Behavior normal.            Vital Signs: BP (!) 160/103 (BP Location: Right Arm)   Pulse (!) 51   Temp 97.6 F (36.4 C) (Axillary)   Resp 16   Ht 6' (1.829 m)   Wt 84.9 kg   SpO2 97%   BMI 25.38 kg/m  SpO2: SpO2: 97 % O2 Device: O2 Device: Room Air O2 Flow Rate: O2 Flow Rate (L/min): 2 L/min      Palliative Assessment/Data:  30%   Palliative Care Assessment & Plan   Patient Profile: 89 y.o. male  with past medical history of  CAD, hypertension, hyperlipidemia, nephrolithiasis, type 2 diabetes, paroxysmal A-fib on Eliquis, dementia, bilateral lower extremity lymphedema/venous stasis dermatitis admitted on 05/29/2022 with abdominal/right groin pain following an unwitnessed fall out of his bed.    Patient is admitted for acute hypoxemic respiratory failure secondary to COVID-19 infection and left lower lobe pneumonia, concerning for aspiration. PMT has been consulted to assist with goals of care conversation.  Assessment: Goals of care conversation Severe PAD Dementia Acute respiratory failure with hypoxia secondary to left lower lobe pneumonia and COVID-19 infection Debility  Recommendations/Plan: Continue DNR Continue current care for now, family will likely proceed with comfort care soon but will discuss further with more family members this evening Psychosocial and emotional support provided Goal is for long-term care with hospice if not a candidate for residential hospice PMT will continue to follow and support   Prognosis: Weeks to months  Discharge Planning: To Be Determined  Care plan was discussed with patient, patient's family, Dr. Sharol Given, Dr. Starla Link   MDM high         Marchello Rothgeb Johnnette Litter, PA-C  Palliative Medicine Team Team phone # [phone]  Thank you for allowing the Palliative Medicine Team to assist in the care of this patient. Please utilize secure chat with additional questions, if there is no response within 30 minutes please call the above phone number.  Palliative Medicine Team providers are available by phone from 7am to 7pm daily and can be reached through the team cell phone.  Should this patient require assistance outside of these hours, please call the patient's attending physician.

## 2022-06-04 NOTE — Plan of Care (Signed)
  Problem: Education: Goal: Knowledge of General Education information will improve Description: Including pain rating scale, medication(s)/side effects and non-pharmacologic comfort measures Outcome: Progressing   Problem: Health Behavior/Discharge Planning: Goal: Ability to manage health-related needs will improve Outcome: Progressing   Problem: Clinical Measurements: Goal: Ability to maintain clinical measurements within normal limits will improve Outcome: Progressing Goal: Will remain free from infection Outcome: Progressing Goal: Diagnostic test results will improve Outcome: Progressing Goal: Respiratory complications will improve Outcome: Progressing Goal: Cardiovascular complication will be avoided Outcome: Progressing   Problem: Activity: Goal: Risk for activity intolerance will decrease Outcome: Progressing   Problem: Nutrition: Goal: Adequate nutrition will be maintained Outcome: Progressing   Problem: Coping: Goal: Level of anxiety will decrease Outcome: Progressing   Problem: Elimination: Goal: Will not experience complications related to bowel motility Outcome: Progressing Goal: Will not experience complications related to urinary retention Outcome: Progressing   Problem: Pain Managment: Goal: General experience of comfort will improve Outcome: Progressing   Problem: Safety: Goal: Ability to remain free from injury will improve Outcome: Progressing   Problem: Skin Integrity: Goal: Risk for impaired skin integrity will decrease Outcome: Progressing   Problem: Education: Goal: Ability to describe self-care measures that may prevent or decrease complications (Diabetes Survival Skills Education) will improve Outcome: Progressing Goal: Individualized Educational Video(s) Outcome: Progressing   Problem: Coping: Goal: Ability to adjust to condition or change in health will improve Outcome: Progressing   Problem: Fluid Volume: Goal: Ability to  maintain a balanced intake and output will improve Outcome: Progressing   Problem: Health Behavior/Discharge Planning: Goal: Ability to identify and utilize available resources and services will improve Outcome: Progressing Goal: Ability to manage health-related needs will improve Outcome: Progressing   Problem: Metabolic: Goal: Ability to maintain appropriate glucose levels will improve Outcome: Progressing   Problem: Nutritional: Goal: Maintenance of adequate nutrition will improve Outcome: Progressing Goal: Progress toward achieving an optimal weight will improve Outcome: Progressing   Problem: Skin Integrity: Goal: Risk for impaired skin integrity will decrease Outcome: Progressing   Problem: Tissue Perfusion: Goal: Adequacy of tissue perfusion will improve Outcome: Progressing   Problem: Education: Goal: Knowledge of risk factors and measures for prevention of condition will improve Outcome: Progressing   Problem: Coping: Goal: Psychosocial and spiritual needs will be supported Outcome: Progressing   Problem: Respiratory: Goal: Will maintain a patent airway Outcome: Progressing Goal: Complications related to the disease process, condition or treatment will be avoided or minimized Outcome: Progressing   

## 2022-06-05 DIAGNOSIS — J9601 Acute respiratory failure with hypoxia: Secondary | ICD-10-CM | POA: Diagnosis not present

## 2022-06-05 DIAGNOSIS — I739 Peripheral vascular disease, unspecified: Secondary | ICD-10-CM | POA: Diagnosis not present

## 2022-06-05 DIAGNOSIS — Z7189 Other specified counseling: Secondary | ICD-10-CM | POA: Diagnosis not present

## 2022-06-05 DIAGNOSIS — J189 Pneumonia, unspecified organism: Secondary | ICD-10-CM | POA: Diagnosis not present

## 2022-06-05 DIAGNOSIS — U071 COVID-19: Secondary | ICD-10-CM | POA: Diagnosis not present

## 2022-06-05 LAB — CBC WITH DIFFERENTIAL/PLATELET
Abs Immature Granulocytes: 0.2 10*3/uL — ABNORMAL HIGH (ref 0.00–0.07)
Basophils Absolute: 0 10*3/uL (ref 0.0–0.1)
Basophils Relative: 0 %
Eosinophils Absolute: 0 10*3/uL (ref 0.0–0.5)
Eosinophils Relative: 0 %
HCT: 39.6 % (ref 39.0–52.0)
Hemoglobin: 14.3 g/dL (ref 13.0–17.0)
Immature Granulocytes: 1 %
Lymphocytes Relative: 4 %
Lymphs Abs: 0.7 10*3/uL (ref 0.7–4.0)
MCH: 34 pg (ref 26.0–34.0)
MCHC: 36.1 g/dL — ABNORMAL HIGH (ref 30.0–36.0)
MCV: 94.1 fL (ref 80.0–100.0)
Monocytes Absolute: 1.1 10*3/uL — ABNORMAL HIGH (ref 0.1–1.0)
Monocytes Relative: 6 %
Neutro Abs: 15.5 10*3/uL — ABNORMAL HIGH (ref 1.7–7.7)
Neutrophils Relative %: 89 %
Platelets: 272 10*3/uL (ref 150–400)
RBC: 4.21 MIL/uL — ABNORMAL LOW (ref 4.22–5.81)
RDW: 13.7 % (ref 11.5–15.5)
WBC: 17.6 10*3/uL — ABNORMAL HIGH (ref 4.0–10.5)
nRBC: 0 % (ref 0.0–0.2)

## 2022-06-05 LAB — COMPREHENSIVE METABOLIC PANEL
ALT: 152 U/L — ABNORMAL HIGH (ref 0–44)
AST: 85 U/L — ABNORMAL HIGH (ref 15–41)
Albumin: 2.8 g/dL — ABNORMAL LOW (ref 3.5–5.0)
Alkaline Phosphatase: 63 U/L (ref 38–126)
Anion gap: 10 (ref 5–15)
BUN: 29 mg/dL — ABNORMAL HIGH (ref 8–23)
CO2: 22 mmol/L (ref 22–32)
Calcium: 8.1 mg/dL — ABNORMAL LOW (ref 8.9–10.3)
Chloride: 102 mmol/L (ref 98–111)
Creatinine, Ser: 1.16 mg/dL (ref 0.61–1.24)
GFR, Estimated: 59 mL/min — ABNORMAL LOW (ref >60)
Glucose, Bld: 151 mg/dL — ABNORMAL HIGH (ref 70–99)
Potassium: 4.4 mmol/L (ref 3.5–5.1)
Sodium: 134 mmol/L — ABNORMAL LOW (ref 135–145)
Total Bilirubin: 0.6 mg/dL (ref 0.3–1.2)
Total Protein: 5.9 g/dL — ABNORMAL LOW (ref 6.5–8.1)

## 2022-06-05 LAB — GLUCOSE, CAPILLARY
Glucose-Capillary: 189 mg/dL — ABNORMAL HIGH (ref 70–99)
Glucose-Capillary: 161 mg/dL — ABNORMAL HIGH (ref 70–99)

## 2022-06-05 LAB — MAGNESIUM: Magnesium: 2.2 mg/dL (ref 1.7–2.4)

## 2022-06-05 LAB — C-REACTIVE PROTEIN: CRP: 0.5 mg/dL (ref <1.0)

## 2022-06-05 MED ORDER — ACETAMINOPHEN 650 MG RE SUPP: 650.0000 mg | Freq: Four times a day (QID) | RECTAL | Status: DC | PRN

## 2022-06-05 MED ORDER — LORAZEPAM 2 MG/ML PO CONC
1.0000 mg | ORAL | Status: DC | PRN
  Administered 2022-06-13 – 2022-06-29 (×11): 1 mg via ORAL
  Filled 2022-06-05 (×11): qty 1

## 2022-06-05 MED ORDER — ONDANSETRON HCL 4 MG/2ML IJ SOLN: 4.0000 mg | Freq: Four times a day (QID) | INTRAMUSCULAR | Status: DC | PRN

## 2022-06-05 MED ORDER — HALOPERIDOL LACTATE 5 MG/ML IJ SOLN
0.5000 mg | INTRAMUSCULAR | Status: DC | PRN
  Administered 2022-06-11 – 2022-06-22 (×3): 0.5 mg via INTRAVENOUS
  Filled 2022-06-05 (×3): qty 1

## 2022-06-05 MED ORDER — HALOPERIDOL 0.5 MG PO TABS
0.5000 mg | ORAL_TABLET | ORAL | Status: DC | PRN
  Administered 2022-06-29 – 2022-06-30 (×2): 0.5 mg via ORAL
  Filled 2022-06-05 (×3): qty 1

## 2022-06-05 MED ORDER — ONDANSETRON 4 MG PO TBDP: 4.0000 mg | ORAL_TABLET | Freq: Four times a day (QID) | ORAL | Status: DC | PRN

## 2022-06-05 MED ORDER — ACETAMINOPHEN 325 MG PO TABS
650.0000 mg | ORAL_TABLET | Freq: Four times a day (QID) | ORAL | Status: DC | PRN
  Administered 2022-06-05 – 2022-06-30 (×4): 650 mg via ORAL
  Filled 2022-06-05 (×6): qty 2

## 2022-06-05 MED ORDER — MORPHINE SULFATE (CONCENTRATE) 10 MG/0.5ML PO SOLN
5.0000 mg | Freq: Four times a day (QID) | ORAL | Status: DC
  Administered 2022-06-05 – 2022-07-01 (×98): 5 mg via ORAL
  Filled 2022-06-05 (×99): qty 0.5

## 2022-06-05 MED ORDER — GLYCOPYRROLATE 1 MG PO TABS: 2.0000 mg | ORAL_TABLET | ORAL | Status: DC | PRN

## 2022-06-05 MED ORDER — GLYCOPYRROLATE 0.2 MG/ML IJ SOLN
0.4000 mg | INTRAMUSCULAR | Status: DC | PRN
  Administered 2022-06-09 – 2022-06-15 (×2): 0.4 mg via INTRAVENOUS
  Filled 2022-06-05 (×2): qty 2

## 2022-06-05 MED ORDER — MORPHINE SULFATE (PF) 2 MG/ML IV SOLN
1.0000 mg | INTRAVENOUS | Status: DC | PRN
  Administered 2022-06-07 – 2022-06-15 (×5): 1 mg via INTRAVENOUS
  Filled 2022-06-05 (×5): qty 1

## 2022-06-05 MED ORDER — POLYVINYL ALCOHOL 1.4 % OP SOLN: 1.0000 [drp] | Freq: Four times a day (QID) | OPHTHALMIC | Status: DC | PRN

## 2022-06-05 MED ORDER — HALOPERIDOL LACTATE 2 MG/ML PO CONC: 0.5000 mg | ORAL | Status: DC | PRN

## 2022-06-05 MED ORDER — LORAZEPAM 1 MG PO TABS
1.0000 mg | ORAL_TABLET | ORAL | Status: DC | PRN
  Administered 2022-06-09 – 2022-06-30 (×14): 1 mg via ORAL
  Filled 2022-06-05 (×17): qty 1

## 2022-06-05 MED ORDER — BISACODYL 10 MG RE SUPP
10.0000 mg | Freq: Every day | RECTAL | Status: DC | PRN
  Administered 2022-06-19: 10 mg via RECTAL
  Filled 2022-06-05: qty 1

## 2022-06-05 MED ORDER — SENNA 8.6 MG PO TABS
1.0000 | ORAL_TABLET | Freq: Every evening | ORAL | Status: DC | PRN
  Administered 2022-06-18: 8.6 mg via ORAL
  Filled 2022-06-05: qty 1

## 2022-06-05 MED ORDER — BIOTENE DRY MOUTH MT LIQD: 15.0000 mL | OROMUCOSAL | Status: DC | PRN

## 2022-06-05 NOTE — TOC Progression Note (Addendum)
Transition of Care [facility]) - Progression Note    Patient Details  Name: Holly Pring MRN: [id number] Date of Birth: [birth year]  Transition of Care [facility]) CM/SW [place], [place] Phone Number: 06/05/2022, 11:50 AM  Clinical Narrative:    CSW noted palliative care conversations from over the weekend. CSW reached out to palliative for additional information regarding possible discharge needs. TOC to follow palliative for this pt and will be available for further needs. TOC available as needed.   Palliative confirming family has decided on comfort care. Palliative to see how pt does after 24 hours on comfort to determine placement. TOC will continue to follow for DC needs.   Expected Discharge Plan: [place] Barriers to Discharge: Continued Medical Work up, SNF Pending bed offer  Expected Discharge Plan and Services     Post Acute Care Choice: [place] Living arrangements for the past 2 months: Single Family Home                                       Social Determinants of Health (SDOH) Interventions SDOH Screenings   Food Insecurity: No Food Insecurity (05/30/2022)  Housing: Low Risk  (05/30/2022)  Transportation Needs: No Transportation Needs (05/30/2022)  Utilities: Not At Risk (05/30/2022)  Depression (PHQ2-9): Low Risk  (09/09/2021)  Financial Resource Strain: Low Risk  (09/09/2021)  Physical Activity: Sufficiently Active (09/09/2021)  Stress: No Stress Concern Present (09/09/2021)  Tobacco Use: Medium Risk (05/31/2022)    Readmission Risk Interventions    09/28/2019    2:22 PM  Readmission Risk Prevention Plan  Post Dischage Appt Complete  Medication Screening Complete  Transportation Screening Complete

## 2022-06-05 NOTE — Plan of Care (Signed)
  Problem: Nutrition: °Goal: Adequate nutrition will be maintained °Outcome: Progressing °  °Problem: Coping: °Goal: Level of anxiety will decrease °Outcome: Progressing °  °Problem: Safety: °Goal: Ability to remain free from injury will improve °Outcome: Progressing °  °

## 2022-06-05 NOTE — Progress Notes (Signed)
PROGRESS NOTE    Richard Kent  HDQ:[id number] DOB: [birth year] DOA: 05/29/2022 PCP: Richard Lung, MD   Brief Narrative:   89 y.o. male with medical history significant of CAD, hypertension, hyperlipidemia, nephrolithiasis, type 2 diabetes, paroxysmal A-fib on Eliquis, dementia, bilateral lower extremity lymphedema presented to the ED with abdominal/right groin pain following an unwitnessed fall out of his bed a day prior to presentation.  On presentation, he was hypoxic in the 80s on room air requiring supplemental oxygen.  CTA chest was negative for PE but showed developing infiltrate or aspiration in the left lower lobe. CT abdomen pelvis showing probable recently passed 2 mm stone within the urinary bladder and no hydronephrosis. CT head/C-spine/T-spine/L-spine, x-ray of right hip/pelvis negative for acute traumatic injury.  He was started on IV antibiotics.  He subsequently tested positive for COVID-19 and was started on remdesivir and Decadron as well.  Orthopedics consulted for gangrenous changes of left toes.  Palliative care was subsequently consulted.  Assessment & Plan:   Acute respiratory failure with hypoxia COVID-19 infection Left lower lobe pneumonia: COVID 19 pneumonia with possibly combination of aspiration pneumonia -Currently on room air.  Wean off as able.  Blood cultures negative so far. -Treated with Unasyn and completed course.  Diet as per SLP recommendations -COVID-19 Labs  Recent Labs    06/05/22 0310  CRP 0.5     Lab Results  Component Value Date   SARSCOV2NAA POSITIVE (A) 05/30/2022   [place] NEGATIVE 09/27/2019   -Monitor daily inflammatory markers -Currently on Decadron, finish 10-day course.  Completed remdesivir -Continue isolation  CAD -Stable.  No chest pain.  Continue metoprolol succinate and statin.  Outpatient follow-up with cardiology  Chronic bilateral lower extremity edema/lymphedema Gangrenous changes of toes of left foot/peripheral  arterial disease -Dr. Sharol Given following: ABIs showed severe right and lower extremity arterial disease.  Discussed with Dr. Sharol Given via secure chat on 06/04/2022 and he thinks that patient is not a good candidate for surgical intervention. -Continue oral Lasix  Hypertension -Continue amlodipine, Lasix and metoprolol succinate  Leukocytosis -Worsening.  Hold off on any repeat labs till further palliative care discussions.  Elevated LFTs -Labs plan as above  Diabetes mellitus type 2 with neuropathy -A1c 6.3 on 05/19/2022 -continue CBGs with SSI -Continue gabapentin  Paroxysmal A-fib -Rate controlled.  Continue Eliquis and metoprolol succinate  Dementia Goals of care -Fall and delirium precautions.   -Palliative care following.  Overall prognosis is very poor.  Family leaning towards comfort measures.  Physical deconditioning -PT recommends SNF placement.  TOC consulted.  DVT prophylaxis: Eliquis Code Status: DNR Family Communication: None at bedside Disposition Plan: Status is: Inpatient Of severity of illness.  Need for SNF placement   Consultants: Orthopedics/Dr. Sharol Given; palliative care  Procedures: None  Antimicrobials:  Anti-infectives (From admission, onward)    Start     Dose/Rate Route Frequency Ordered Stop   05/31/22 1000  remdesivir 100 mg in sodium chloride 0.9 % 100 mL IVPB       See Hyperspace for full Linked Orders Report.   100 mg 200 mL/hr over 30 Minutes Intravenous Daily 05/30/22 0807 06/01/22 1055   05/30/22 0900  remdesivir 200 mg in sodium chloride 0.9% 250 mL IVPB       See Hyperspace for full Linked Orders Report.   200 mg 580 mL/hr over 30 Minutes Intravenous Once 05/30/22 0807 05/30/22 1220   05/30/22 0400  Ampicillin-Sulbactam (UNASYN) 3 g in sodium chloride 0.9 % 100 mL IVPB  3 g 200 mL/hr over 30 Minutes Intravenous Every 6 hours 05/30/22 0333 06/03/22 2311   05/29/22 2230  cefTRIAXone (ROCEPHIN) 1 g in sodium chloride 0.9 % 100 mL IVPB         1 g 200 mL/hr over 30 Minutes Intravenous  Once 05/29/22 2227 05/29/22 2316   05/29/22 2230  azithromycin (ZITHROMAX) 500 mg in sodium chloride 0.9 % 250 mL IVPB        500 mg 250 mL/hr over 60 Minutes Intravenous  Once 05/29/22 2227 05/30/22 0022         Subjective: Patient seen and examined at bedside.  Still confused and a poor historian.  No agitation, fever, vomiting reported.   objective: Vitals:   06/04/22 1333 06/04/22 2057 06/05/22 0100 06/05/22 0500  BP: (!) 136/59 128/87 125/74 130/74  Pulse: (!) 52 (!) 53 (!) 55 (!) 58  Resp: '16 16 18 20  '$ Temp:  98.1 F (36.7 C) 98.3 F (36.8 C) 98 F (36.7 C)  TempSrc:  Oral Oral Oral  SpO2: 97% 99% 98% 98%  Weight:      Height:        Intake/Output Summary (Last 24 hours) at 06/05/2022 0759 Last data filed at 06/04/2022 0925 Gross per 24 hour  Intake 250 ml  Output --  Net 250 ml    Filed Weights   06/01/22 0419 06/02/22 0500 06/03/22 0500  Weight: 84.5 kg 84.5 kg 84.9 kg    Examination:  General: On room air currently.  No distress.  Looks chronically ill and deconditioned ENT/neck: No obvious JVD elevation or palpable neck masses noted respiratory: Decreased breath sounds at bases bilaterally with some crackles CVS: S1-S2 heard; bradycardic  abdominal: Soft, nontender, mildly distended; no organomegaly, bowel sounds heard normally  extremities: Bilateral lower extremity edema present; no cyanosis  CNS: Confused.  Poor historian.  No focal neurologic deficit.  Able to move extremities lymph: No palpable cervical lymphadenopathy  skin: Gangrenous changes of left great toe and left third toe present psych: Currently not agitated and has flat affect musculoskeletal: No obvious other joint erythema/deformity/tenderness    Data Reviewed: I have personally reviewed following labs and imaging studies  CBC: Recent Labs  Lab 05/29/22 0902 05/30/22 0732 06/01/22 0433 06/02/22 0352 06/05/22 0310  WBC 11.3*  10.9* 13.6* 12.2* 17.6*  NEUTROABS 8.6*  --  11.5* 10.5* 15.5*  HGB 13.4 12.4* 12.3* 12.4* 14.3  HCT 40.2 37.5* 35.2* 36.4* 39.6  MCV 98.8 98.2 96.4 97.6 94.1  PLT 278 258 250 263 166    Basic Metabolic Panel: Recent Labs  Lab 05/29/22 0902 06/01/22 0433 06/02/22 0352 06/05/22 0310  NA 137 137 134* 134*  K 4.2 3.9 4.0 4.4  CL 103 103 104 102  CO2 '23 23 22 22  '$ GLUCOSE 114* 107* 139* 151*  BUN '16 20 21 '$ 29*  CREATININE 0.86 1.01 0.95 1.16  CALCIUM 8.6* 8.0* 7.9* 8.1*  MG 2.1 2.1 2.1 2.2    GFR: Estimated Creatinine Clearance: 45.5 mL/min (by C-G formula based on SCr of 1.16 mg/dL). Liver Function Tests: Recent Labs  Lab 05/29/22 0902 06/01/22 0433 06/02/22 0352 06/05/22 0310  AST 36 26 33 85*  ALT 28 27 42 152*  ALKPHOS 66 49 49 63  BILITOT 0.9 0.8 0.6 0.6  PROT 6.6 5.7* 5.8* 5.9*  ALBUMIN 3.2* 2.5* 2.7* 2.8*    No results for input(s): "LIPASE", "AMYLASE" in the last 168 hours. No results for input(s): "AMMONIA" in the last 168  hours. Coagulation Profile: No results for input(s): "INR", "PROTIME" in the last 168 hours. Cardiac Enzymes: No results for input(s): "CKTOTAL", "CKMB", "CKMBINDEX", "TROPONINI" in the last 168 hours. BNP (last 3 results) No results for input(s): "PROBNP" in the last 8760 hours. HbA1C: No results for input(s): "HGBA1C" in the last 72 hours. CBG: Recent Labs  Lab 06/04/22 0512 06/04/22 0800 06/04/22 1131 06/04/22 1708 06/04/22 2053  GLUCAP 138* 114* 156* 265* 226*    Lipid Profile: No results for input(s): "CHOL", "HDL", "LDLCALC", "TRIG", "CHOLHDL", "LDLDIRECT" in the last 72 hours. Thyroid Function Tests: No results for input(s): "TSH", "T4TOTAL", "FREET4", "T3FREE", "THYROIDAB" in the last 72 hours. Anemia Panel: No results for input(s): "VITAMINB12", "FOLATE", "FERRITIN", "TIBC", "IRON", "RETICCTPCT" in the last 72 hours.  Sepsis Labs: Recent Labs  Lab 05/29/22 1250 05/31/22 0812 06/01/22 0433 06/02/22 0352   PROCALCITON  --  <0.10 <0.10 <0.10  LATICACIDVEN 1.9  --   --   --      Recent Results (from the past 240 hour(s))  Blood Culture (routine x 2)     Status: None   Collection Time: 05/29/22 12:35 PM   Specimen: BLOOD LEFT HAND  Result Value Ref Range Status   Specimen Description BLOOD LEFT HAND  Final   Special Requests   Final    BOTTLES DRAWN AEROBIC AND ANAEROBIC Blood Culture adequate volume   Culture   Final    NO GROWTH 5 DAYS Performed at [facility], [place] [address] [place], [place] [address]    Report Status 06/03/2022 FINAL  Final  Blood Culture (routine x 2)     Status: None   Collection Time: 05/29/22 12:42 PM   Specimen: BLOOD RIGHT HAND  Result Value Ref Range Status   Specimen Description BLOOD RIGHT HAND  Final   Special Requests   Final    BOTTLES DRAWN AEROBIC ONLY Blood Culture results may not be optimal due to an inadequate volume of blood received in culture bottles   Culture   Final    NO GROWTH 5 DAYS Performed at [facility], [place] [address] [place], [place] [address]    Report Status 06/03/2022 FINAL  Final  Resp panel by RT-PCR (RSV, Flu A&B, Covid) Anterior Nasal Swab     Status: Abnormal   Collection Time: 05/30/22  4:00 AM   Specimen: Anterior Nasal Swab  Result Value Ref Range Status   SARS Coronavirus 2 by RT PCR POSITIVE (A) NEGATIVE Final    Comment: (NOTE) SARS-CoV-2 target nucleic acids are DETECTED.  The SARS-CoV-2 RNA is generally detectable in upper respiratory specimens during the acute phase of infection. Positive results are indicative of the presence of the identified virus, but do not rule out bacterial infection or co-infection with other pathogens not detected by the test. Clinical correlation with patient history and other diagnostic information is necessary to determine patient infection status. The expected result is Negative.  Fact Sheet for Patients: EntrepreneurPulse.com.au  Fact Sheet  for Healthcare Providers: IncredibleEmployment.be  This test is not yet approved or cleared by the [place] FDA and  has been authorized for detection and/or diagnosis of SARS-CoV-2 by FDA under an Emergency Use Authorization (EUA).  This EUA will remain in effect (meaning this test can be used) for the duration of  the COVID-19 declaration under Section 564(b)(1) of the A ct, 21 U.S.C. section 360bbb-3(b)(1), unless the authorization is terminated or revoked sooner.     Influenza A by PCR  NEGATIVE NEGATIVE Final   Influenza B by PCR NEGATIVE NEGATIVE Final    Comment: (NOTE) The Xpert Xpress SARS-CoV-2/FLU/RSV plus assay is intended as an aid in the diagnosis of influenza from Nasopharyngeal swab specimens and should not be used as a sole basis for treatment. Nasal washings and aspirates are unacceptable for Xpert Xpress SARS-CoV-2/FLU/RSV testing.  Fact Sheet for Patients: EntrepreneurPulse.com.au  Fact Sheet for Healthcare Providers: IncredibleEmployment.be  This test is not yet approved or cleared by the [place] FDA and has been authorized for detection and/or diagnosis of SARS-CoV-2 by FDA under an Emergency Use Authorization (EUA). This EUA will remain in effect (meaning this test can be used) for the duration of the COVID-19 declaration under Section 564(b)(1) of the Act, 21 U.S.C. section 360bbb-3(b)(1), unless the authorization is terminated or revoked.     Resp Syncytial Virus by PCR NEGATIVE NEGATIVE Final    Comment: (NOTE) Fact Sheet for Patients: EntrepreneurPulse.com.au  Fact Sheet for Healthcare Providers: IncredibleEmployment.be  This test is not yet approved or cleared by the [place] FDA and has been authorized for detection and/or diagnosis of SARS-CoV-2 by FDA under an Emergency Use Authorization (EUA). This EUA will remain in effect (meaning this  test can be used) for the duration of the COVID-19 declaration under Section 564(b)(1) of the Act, 21 U.S.C. section 360bbb-3(b)(1), unless the authorization is terminated or revoked.  Performed at [facility], [place] [address], [place], [place] [address]          Radiology Studies: VAS [place] ABI WITH/WO TBI  Result Date: 06/03/2022  LOWER EXTREMITY DOPPLER STUDY Patient Name:  Richard Kent  Date of Exam:   06/03/2022 Medical Rec #: [id number]      Accession #:    [id number] Date of Birth: August 23, 1928      Patient Gender: M Patient Age:   46 years Exam Location:  [facility] Procedure:      VAS [place] ABI WITH/WO TBI Referring Phys: MARCUS DUDA --------------------------------------------------------------------------------  Indications: Ulceration, non palpable pulses. High Risk Factors: Prior CVA (2011). Other Factors: Covid positive.  Limitations: Today's exam was limited due to involuntary patient movement and              patient with dementia and sleeping. Comparison Study: No prior study Performing Technologist: Sharion Dove RVS  Examination Guidelines: A complete evaluation includes at minimum, Doppler waveform signals and systolic blood pressure reading at the level of bilateral brachial, anterior tibial, and posterior tibial arteries, when vessel segments are accessible. Bilateral testing is considered an integral part of a complete examination. Photoelectric Plethysmograph (PPG) waveforms and toe systolic pressure readings are included as required and additional duplex testing as needed. Limited examinations for reoccurring indications may be performed as noted.  ABI Findings: +---------+------------------+-----+-------------------+--------+ Right    Rt Pressure (mmHg)IndexWaveform           Comment  +---------+------------------+-----+-------------------+--------+ Brachial 153                    multiphasic                  +---------+------------------+-----+-------------------+--------+ PTA      53                0.35 dampened monophasic         +---------+------------------+-----+-------------------+--------+ DP       39                0.25 dampened monophasic         +---------+------------------+-----+-------------------+--------+  Great Toe                       Absent                      +---------+------------------+-----+-------------------+--------+ +---------+------------------+-----+-------------------+-------+ Left     Lt Pressure (mmHg)IndexWaveform           Comment +---------+------------------+-----+-------------------+-------+ PTA      55                0.36 dampened monophasic        +---------+------------------+-----+-------------------+-------+ DP       12                0.08 dampened monophasic        +---------+------------------+-----+-------------------+-------+ Great Toe                       Absent                     +---------+------------------+-----+-------------------+-------+ +-------+-----------+-----------+------------+------------+ ABI/TBIToday's ABIToday's TBIPrevious ABIPrevious TBI +-------+-----------+-----------+------------+------------+ Right  0.35       absent                              +-------+-----------+-----------+------------+------------+ Left   0.36       absent                              +-------+-----------+-----------+------------+------------+  Summary: Right: Resting right ankle-brachial index indicates severe right lower extremity arterial disease. The right toe-brachial index is abnormal. Left: Resting left ankle-brachial index indicates severe left lower extremity arterial disease. The left toe-brachial index is abnormal. *See table(s) above for measurements and observations.  Electronically signed by Monica Martinez MD on 06/03/2022 at 4:03:24 PM.    Final         Scheduled Meds:  amLODipine  2.5 mg Oral  Daily   apixaban  5 mg Oral BID   dexamethasone (DECADRON) injection  6 mg Intravenous Q24H   furosemide  10 mg Oral Daily   gabapentin  400 mg Oral BID   insulin aspart  0-5 Units Subcutaneous QHS   insulin aspart  0-9 Units Subcutaneous TID WC   metoprolol succinate  12.5 mg Oral Daily   simvastatin  20 mg Oral QPM   Continuous Infusions:          Aline August, MD [facility] 06/05/2022, 7:59 AM

## 2022-06-05 NOTE — Progress Notes (Signed)
Daily Progress Note   Patient Name: Richard Kent       Date: 06/05/2022 DOB: 1929/02/23  Age: 89 y.o. MRN#: [id number] Attending Physician: Aline August, MD Primary Care Physician: Denita Lung, MD Admit Date: 05/29/2022  Reason for Consultation/Follow-up: Establishing goals of care  Subjective: Medical records reviewed including progress notes and labs.  Patient assessed at the bedside.  He is very confused.  Provided reassurance.  Discussed with RN.  He has been this way all morning.  I called patient's wife [name] for ongoing goals of care discussions and palliative support.  She tells me that after discussing with more family members last night, they are all in agreement with the desire to proceed with transition to comfort care today.  I explained that I would discontinue any interventions not aimed at providing comfort and she voices her agreement with this.  She tells me that they prefer [facility] if possible, knowing that as we discussed yesterday he may not be eligible and that his COVID-positive status may complicate this as well.  She would consider other facilities, either hospice facilities or long-term care facilities, after hearing from [facility] for final determination.   I then had a discussion with TOC and was made aware that [facility] is not open for the rest of the month.  Given that patient is being scheduled on oral morphine for severe pain due to PAD and most likely will need more time in quarantine with airborne precautions prior to placement, will plan to see how he does tomorrow after 24 hours on comfort care and continue discussions on family preference for placement with hospice.  Questions and concerns addressed. PMT will continue to support holistically.    Length of Stay: 7  Physical Exam Vitals and nursing note reviewed.  Constitutional:      General: He is not in acute distress.    Appearance: He is ill-appearing.  Cardiovascular:     Rate and Rhythm: Normal rate.  Pulmonary:     Effort: Pulmonary effort is normal.  Neurological:     Mental Status: He is alert. He is disoriented and confused.  Psychiatric:        Behavior: Behavior normal.            Vital Signs: BP (!) 169/78 (BP Location: Right Arm)   Pulse 73   Temp 98 F (36.7 C) (Oral)   Resp 18   Ht 6' (1.829 m)   Wt 84.9 kg   SpO2 98%   BMI 25.38 kg/m  SpO2: SpO2: 98 % O2 Device: O2 Device: Room Air O2 Flow Rate: O2 Flow Rate (L/min): 2 L/min      Palliative Assessment/Data: 30%   Palliative Care Assessment & Plan   Patient Profile: 89 y.o. male  with past medical history of  CAD, hypertension, hyperlipidemia, nephrolithiasis, type 2 diabetes, paroxysmal A-fib on Eliquis, dementia, bilateral lower extremity lymphedema/venous stasis dermatitis admitted on 05/29/2022 with abdominal/right groin pain following an unwitnessed fall out of his bed.    Patient is admitted for acute hypoxemic respiratory failure secondary to COVID-19 infection and left lower lobe pneumonia, concerning for aspiration. PMT has been consulted to assist with goals of  care conversation.  Assessment: Goals of care conversation Severe PAD Dementia Acute respiratory failure with hypoxia secondary to left lower lobe pneumonia and COVID-19 infection Debility  Recommendations/Plan: Continue DNR Transition to comfort care today Morphine 5 mg p.o. every 6 hours and IV morphine every 2 hours PRN for pain/air hunger/comfort Robinul PRN for excessive secretions Ativan PRN for agitation/anxiety Zofran PRN for nausea Liquifilm tears PRN for dry eyes Haldol PRN for agitation/anxiety May have comfort feeding Comfort cart for family Unrestricted visitations in the setting of EOL (per  policy) Oxygen PRN 2L or less for comfort. No escalation.   Psychosocial and emotional support provided Ongoing discussions regarding disposition with hospice pending his status tomorrow after initiation of morphine PMT will continue to follow and support   Prognosis: Weeks to months  Discharge Planning: To Be Determined  Care plan was discussed with patient, patient's family, TOC, Dr. Starla Link   MDM high         Shyasia Funches Johnnette Litter, PA-C  Palliative Medicine Team Team phone # [phone]  Thank you for allowing the Palliative Medicine Team to assist in the care of this patient. Please utilize secure chat with additional questions, if there is no response within 30 minutes please call the above phone number.  Palliative Medicine Team providers are available by phone from 7am to 7pm daily and can be reached through the team cell phone.  Should this patient require assistance outside of these hours, please call the patient's attending physician.

## 2022-06-06 DIAGNOSIS — W19XXXA Unspecified fall, initial encounter: Secondary | ICD-10-CM | POA: Diagnosis not present

## 2022-06-06 DIAGNOSIS — R0902 Hypoxemia: Secondary | ICD-10-CM | POA: Diagnosis not present

## 2022-06-06 DIAGNOSIS — R451 Restlessness and agitation: Secondary | ICD-10-CM

## 2022-06-06 DIAGNOSIS — Z515 Encounter for palliative care: Secondary | ICD-10-CM | POA: Diagnosis not present

## 2022-06-06 DIAGNOSIS — I739 Peripheral vascular disease, unspecified: Secondary | ICD-10-CM | POA: Diagnosis not present

## 2022-06-06 DIAGNOSIS — R52 Pain, unspecified: Secondary | ICD-10-CM

## 2022-06-06 DIAGNOSIS — J189 Pneumonia, unspecified organism: Secondary | ICD-10-CM | POA: Diagnosis not present

## 2022-06-06 DIAGNOSIS — U071 COVID-19: Secondary | ICD-10-CM | POA: Diagnosis not present

## 2022-06-06 MED ORDER — BIOTENE DRY MOUTH MT LIQD
15.0000 mL | Freq: Two times a day (BID) | OROMUCOSAL | Status: DC
  Administered 2022-06-07 – 2022-07-04 (×48): 15 mL via TOPICAL
  Filled 2022-06-06: qty 15

## 2022-06-06 NOTE — Progress Notes (Signed)
PROGRESS NOTE    Stephaun Million  UYQ:[id number] DOB: [birth year] DOA: 05/29/2022 PCP: Denita Lung, MD   Brief Narrative:   89 y.o. male with medical history significant of CAD, hypertension, hyperlipidemia, nephrolithiasis, type 2 diabetes, paroxysmal A-fib on Eliquis, dementia, bilateral lower extremity lymphedema presented to the ED with abdominal/right groin pain following an unwitnessed fall out of his bed a day prior to presentation.  On presentation, he was hypoxic in the 80s on room air requiring supplemental oxygen.  CTA chest was negative for PE but showed developing infiltrate or aspiration in the left lower lobe. CT abdomen pelvis showing probable recently passed 2 mm stone within the urinary bladder and no hydronephrosis. CT head/C-spine/T-spine/L-spine, x-ray of right hip/pelvis negative for acute traumatic injury.  He was started on IV antibiotics.  He subsequently tested positive for COVID-19 and was started on remdesivir and Decadron as well.  Patient completed Unasyn and remdesivir treatment.  Orthopedics consulted for gangrenous changes of left toes.  Palliative care was subsequently consulted.  Subsequently, family decided to pursue comfort measures on 06/05/2022.  Assessment & Plan:   Comfort measures only status  acute respiratory failure with hypoxia COVID-19 infection Left lower lobe pneumonia: COVID 19 pneumonia with possibly combination of aspiration pneumonia CAD Chronic bilateral lower extremity edema/lymphedema Gangrenous changes of toes of left foot/peripheral arterial disease Hypertension Leukocytosis Elevated LFTs Diabetes mellitus type 2 with neuropathy Paroxysmal A-fib Dementia Goals of care Physical deconditioning  Plan -As discussed above, family decided to pursue per comfort measures on 06/05/2022.  Palliative care following.  Possible residential hospice placement: TOC following.  DVT prophylaxis: None for comfort measures  code Status: DNR Family  Communication: None at bedside Disposition Plan: Status is: Inpatient Of severity of illness.  Need for residential hospice placement  Consultants: Orthopedics/Dr. Sharol Given; palliative care  Procedures: None  Antimicrobials:  Anti-infectives (From admission, onward)    Start     Dose/Rate Route Frequency Ordered Stop   05/31/22 1000  remdesivir 100 mg in sodium chloride 0.9 % 100 mL IVPB       See Hyperspace for full Linked Orders Report.   100 mg 200 mL/hr over 30 Minutes Intravenous Daily 05/30/22 0807 06/01/22 1055   05/30/22 0900  remdesivir 200 mg in sodium chloride 0.9% 250 mL IVPB       See Hyperspace for full Linked Orders Report.   200 mg 580 mL/hr over 30 Minutes Intravenous Once 05/30/22 0807 05/30/22 1220   05/30/22 0400  Ampicillin-Sulbactam (UNASYN) 3 g in sodium chloride 0.9 % 100 mL IVPB        3 g 200 mL/hr over 30 Minutes Intravenous Every 6 hours 05/30/22 0333 06/03/22 2311   05/29/22 2230  cefTRIAXone (ROCEPHIN) 1 g in sodium chloride 0.9 % 100 mL IVPB        1 g 200 mL/hr over 30 Minutes Intravenous  Once 05/29/22 2227 05/29/22 2316   05/29/22 2230  azithromycin (ZITHROMAX) 500 mg in sodium chloride 0.9 % 250 mL IVPB        500 mg 250 mL/hr over 60 Minutes Intravenous  Once 05/29/22 2227 05/30/22 0022         Subjective: Patient seen and examined at bedside.  Still confused and a poor historian.  Currently in no distress.   Objective: Vitals:   06/05/22 0100 06/05/22 0500 06/05/22 0751 06/06/22 0651  BP: 125/74 130/74 (!) 169/78 (!) 177/94  Pulse: (!) 55 (!) 58 73 60  Resp: 18 20 18  Temp: 98.3 F (36.8 C) 98 F (36.7 C)  98.6 F (37 C)  TempSrc: Oral Oral    SpO2: 98% 98% 98% 97%  Weight:      Height:        Intake/Output Summary (Last 24 hours) at 06/06/2022 0749 Last data filed at 06/06/2022 0650 Gross per 24 hour  Intake 240 ml  Output 650 ml  Net -410 ml    Filed Weights   06/01/22 0419 06/02/22 0500 06/03/22 0500  Weight: 84.5 kg  84.5 kg 84.9 kg    Examination:  General: Currently in no distress.  Confused.  Looks chronically ill and deconditioned   Data Reviewed: I have personally reviewed following labs and imaging studies  CBC: Recent Labs  Lab 06/01/22 0433 06/02/22 0352 06/05/22 0310  WBC 13.6* 12.2* 17.6*  NEUTROABS 11.5* 10.5* 15.5*  HGB 12.3* 12.4* 14.3  HCT 35.2* 36.4* 39.6  MCV 96.4 97.6 94.1  PLT 250 263 811    Basic Metabolic Panel: Recent Labs  Lab 06/01/22 0433 06/02/22 0352 06/05/22 0310  NA 137 134* 134*  K 3.9 4.0 4.4  CL 103 104 102  CO2 '23 22 22  '$ GLUCOSE 107* 139* 151*  BUN 20 21 29*  CREATININE 1.01 0.95 1.16  CALCIUM 8.0* 7.9* 8.1*  MG 2.1 2.1 2.2    GFR: Estimated Creatinine Clearance: 45.5 mL/min (by C-G formula based on SCr of 1.16 mg/dL). Liver Function Tests: Recent Labs  Lab 06/01/22 0433 06/02/22 0352 06/05/22 0310  AST 26 33 85*  ALT 27 42 152*  ALKPHOS 49 49 63  BILITOT 0.8 0.6 0.6  PROT 5.7* 5.8* 5.9*  ALBUMIN 2.5* 2.7* 2.8*    No results for input(s): "LIPASE", "AMYLASE" in the last 168 hours. No results for input(s): "AMMONIA" in the last 168 hours. Coagulation Profile: No results for input(s): "INR", "PROTIME" in the last 168 hours. Cardiac Enzymes: No results for input(s): "CKTOTAL", "CKMB", "CKMBINDEX", "TROPONINI" in the last 168 hours. BNP (last 3 results) No results for input(s): "PROBNP" in the last 8760 hours. HbA1C: No results for input(s): "HGBA1C" in the last 72 hours. CBG: Recent Labs  Lab 06/04/22 1131 06/04/22 1708 06/04/22 2053 06/05/22 0907 06/05/22 1220  GLUCAP 156* 265* 226* 189* 161*    Lipid Profile: No results for input(s): "CHOL", "HDL", "LDLCALC", "TRIG", "CHOLHDL", "LDLDIRECT" in the last 72 hours. Thyroid Function Tests: No results for input(s): "TSH", "T4TOTAL", "FREET4", "T3FREE", "THYROIDAB" in the last 72 hours. Anemia Panel: No results for input(s): "VITAMINB12", "FOLATE", "FERRITIN", "TIBC",  "IRON", "RETICCTPCT" in the last 72 hours.  Sepsis Labs: Recent Labs  Lab 05/31/22 5726 06/01/22 0433 06/02/22 0352  PROCALCITON <0.10 <0.10 <0.10     Recent Results (from the past 240 hour(s))  Blood Culture (routine x 2)     Status: None   Collection Time: 05/29/22 12:35 PM   Specimen: BLOOD LEFT HAND  Result Value Ref Range Status   Specimen Description BLOOD LEFT HAND  Final   Special Requests   Final    BOTTLES DRAWN AEROBIC AND ANAEROBIC Blood Culture adequate volume   Culture   Final    NO GROWTH 5 DAYS Performed at [facility], [place] [address] [place], [place] [address]    Report Status 06/03/2022 FINAL  Final  Blood Culture (routine x 2)     Status: None   Collection Time: 05/29/22 12:42 PM   Specimen: BLOOD RIGHT HAND  Result Value Ref Range Status   Specimen Description  BLOOD RIGHT HAND  Final   Special Requests   Final    BOTTLES DRAWN AEROBIC ONLY Blood Culture results may not be optimal due to an inadequate volume of blood received in culture bottles   Culture   Final    NO GROWTH 5 DAYS Performed at [facility], [place] [address], [place], [place] [address]    Report Status 06/03/2022 FINAL  Final  Resp panel by RT-PCR (RSV, Flu A&B, Covid) Anterior Nasal Swab     Status: Abnormal   Collection Time: 05/30/22  4:00 AM   Specimen: Anterior Nasal Swab  Result Value Ref Range Status   SARS Coronavirus 2 by RT PCR POSITIVE (A) NEGATIVE Final    Comment: (NOTE) SARS-CoV-2 target nucleic acids are DETECTED.  The SARS-CoV-2 RNA is generally detectable in upper respiratory specimens during the acute phase of infection. Positive results are indicative of the presence of the identified virus, but do not rule out bacterial infection or co-infection with other pathogens not detected by the test. Clinical correlation with patient history and other diagnostic information is necessary to determine patient infection status. The expected result is  Negative.  Fact Sheet for Patients: EntrepreneurPulse.com.au  Fact Sheet for Healthcare Providers: IncredibleEmployment.be  This test is not yet approved or cleared by the [place] FDA and  has been authorized for detection and/or diagnosis of SARS-CoV-2 by FDA under an Emergency Use Authorization (EUA).  This EUA will remain in effect (meaning this test can be used) for the duration of  the COVID-19 declaration under Section 564(b)(1) of the A ct, 21 U.S.C. section 360bbb-3(b)(1), unless the authorization is terminated or revoked sooner.     Influenza A by PCR NEGATIVE NEGATIVE Final   Influenza B by PCR NEGATIVE NEGATIVE Final    Comment: (NOTE) The Xpert Xpress SARS-CoV-2/FLU/RSV plus assay is intended as an aid in the diagnosis of influenza from Nasopharyngeal swab specimens and should not be used as a sole basis for treatment. Nasal washings and aspirates are unacceptable for Xpert Xpress SARS-CoV-2/FLU/RSV testing.  Fact Sheet for Patients: EntrepreneurPulse.com.au  Fact Sheet for Healthcare Providers: IncredibleEmployment.be  This test is not yet approved or cleared by the [place] FDA and has been authorized for detection and/or diagnosis of SARS-CoV-2 by FDA under an Emergency Use Authorization (EUA). This EUA will remain in effect (meaning this test can be used) for the duration of the COVID-19 declaration under Section 564(b)(1) of the Act, 21 U.S.C. section 360bbb-3(b)(1), unless the authorization is terminated or revoked.     Resp Syncytial Virus by PCR NEGATIVE NEGATIVE Final    Comment: (NOTE) Fact Sheet for Patients: EntrepreneurPulse.com.au  Fact Sheet for Healthcare Providers: IncredibleEmployment.be  This test is not yet approved or cleared by the [place] FDA and has been authorized for detection and/or diagnosis of SARS-CoV-2  by FDA under an Emergency Use Authorization (EUA). This EUA will remain in effect (meaning this test can be used) for the duration of the COVID-19 declaration under Section 564(b)(1) of the Act, 21 U.S.C. section 360bbb-3(b)(1), unless the authorization is terminated or revoked.  Performed at [facility], [place] [address], [place], [place] [address]          Radiology Studies: No results found.      Scheduled Meds:  furosemide  10 mg Oral Daily   gabapentin  400 mg Oral BID   morphine CONCENTRATE  5 mg Oral Q6H   Continuous Infusions:  Aline August, MD [facility] 06/06/2022, 7:49 AM

## 2022-06-06 NOTE — Progress Notes (Addendum)
Daily Progress Note   Patient Name: Richard Kent       Date: 06/06/2022 DOB: [birth year]  Age: 89 y.o. MRN#: [id number] Attending Physician: Aline August, MD Primary Care Physician: Denita Lung, MD Admit Date: 05/29/2022  Reason for Consultation/Follow-up: Non pain symptom management, Pain control, Psychosocial/spiritual support, and Terminal Care  Subjective: I have reviewed medical records including EPIC notes and labs. Received report from primary RN - no acute concerns. RN reports patient is more calm today compared to yesterday. RN states patient is eating small amounts.  Went to visit patient at bedside - no family/visitors present. Patient was lying in bed asleep - I did not attempt to wake him to preserve comfort. No signs or non-verbal gestures of pain or discomfort noted. No respiratory distress, increased work of breathing, or secretions noted.   1:11 PM Attempted to call wife/[name]'s mobile to discuss diagnosis, prognosis, GOC, EOL wishes, disposition, and options - no answer - confidential voicemail left and PMT phone number provided with request to return call.  1:38 PM Received notification someone called back; however, it wasn't patient's family. Returned call - mobile number under demographics was entered incorrectly as it was not a number associated with [name]. Mobile phone number removed from demographics.  2:06 PM Called home number listed for [facility] - granddaughter/Sierra answered. [place] states [name] was not at home; however, would be arriving to hospital later this afternoon.  All questions and concerns addressed. Encouraged to call with questions and/or concerns. PMT card provided.  Discussed case and options for possible residential hospice placement vs LTC with hospice  with TOC who will continue to attempt contact and discussions with [facility]. I will attempt to meet [facility] in person if time allows later this afternoon.  Length of Stay: 8  Current Medications: Scheduled Meds:   furosemide  10 mg Oral Daily   gabapentin  400 mg Oral BID   morphine CONCENTRATE  5 mg Oral Q6H    Continuous Infusions:   PRN Meds: acetaminophen **OR** acetaminophen, antiseptic oral rinse, bisacodyl, glycopyrrolate **OR** glycopyrrolate, haloperidol **OR** haloperidol **OR** haloperidol lactate, LORazepam **OR** LORazepam, morphine injection, ondansetron **OR** ondansetron (ZOFRAN) IV, polyvinyl alcohol, senna  Physical Exam Vitals and nursing note reviewed.  Constitutional:      General: He is not in acute distress.    Appearance: He  is ill-appearing.  Pulmonary:     Effort: No respiratory distress.  Skin:    General: Skin is warm and dry.  Neurological:     Mental Status: He is lethargic.     Motor: Weakness present.             Vital Signs: BP (!) 177/94   Pulse 60   Temp 98.6 F (37 C)   Resp 18   Ht 6' (1.829 m)   Wt 84.9 kg   SpO2 97%   BMI 25.38 kg/m  SpO2: SpO2: 97 % O2 Device: O2 Device: Room Air O2 Flow Rate: O2 Flow Rate (L/min): 2 L/min  Intake/output summary:  Intake/Output Summary (Last 24 hours) at 06/06/2022 1245 Last data filed at 06/06/2022 0650 Gross per 24 hour  Intake 240 ml  Output 650 ml  Net -410 ml   LBM: Last BM Date : 06/06/22 Baseline Weight: Weight: 82.8 kg Most recent weight: Weight: 84.9 kg       Palliative Assessment/Data: PPS 20%      Patient Active Problem List   Diagnosis Date Noted   Comfort measures only status 06/06/2022   PVD (peripheral vascular disease) ([place]) 06/02/2022   COVID-19 virus infection 05/30/2022   Acute hypoxemic respiratory failure ([place]) 05/30/2022   CAD (coronary artery disease) 05/30/2022   Pneumonia 05/29/2022   Basal cell carcinoma 05/24/2022   Atrial fibrillation ([place]) 05/19/2022    Type II diabetes mellitus with complication ([place]) 18/84/1660   New onset type 2 diabetes mellitus ([place]) 09/29/2021   Aortic atherosclerosis ([place]) 09/19/2021   Diarrhea 09/27/2019   Senile purpura ([place]) 01/09/2017   Carotid stenosis, bilateral 01/14/2012   Hypertension 01/12/2012   Hyperlipidemia LDL goal <100 12/17/2009   Hemiplegia, late effect of cerebrovascular disease ([place]) 12/17/2009   RBBB 10/21/2009    Palliative Care Assessment & Plan   Patient Profile: 89 y.o. male  with past medical history of  CAD, hypertension, hyperlipidemia, nephrolithiasis, type 2 diabetes, paroxysmal A-fib on Eliquis, dementia, bilateral lower extremity lymphedema/venous stasis dermatitis admitted on 05/29/2022 with abdominal/right groin pain following an unwitnessed fall out of his bed.    Patient is admitted for acute hypoxemic respiratory failure secondary to COVID-19 infection and left lower lobe pneumonia, concerning for aspiration. PMT has been consulted to assist with goals of care conversation.  Assessment: Principal Problem:   COVID-19 virus infection Active Problems:   Hyperlipidemia LDL goal <100   Hypertension   Type II diabetes mellitus with complication (HCC)   Atrial fibrillation (HCC)   Pneumonia   Acute hypoxemic respiratory failure (HCC)   CAD (coronary artery disease)   PVD (peripheral vascular disease) (HCC)   Comfort measures only status   Recommendations/Plan: Continue full comfort measures Continue DNR/DNI as previously documented - durable DNR form completed and placed in shadow chart. Copy was made and will be scanned into Vynca/ACP tab Unfortunately, unable to reach patient's wife today to continue hospice transfer discussions. TOC will also attempt to speak with her Continue current comfort focused medication regimen - no changes today PMT will continue to follow and support holistically  Symptom Management Continue morphine q6h; continue PRN doses for breakthrough  symptoms  Tylenol PRN pain/fever Biotin twice daily Robinul PRN secretions Haldol PRN agitation/delirium Ativan PRN anxiety/seizure/sleep/distress Zofran PRN nausea/vomiting Liquifilm Tears PRN dry eye Lasix daily Senna and dulcolax PRN constipation   Goals of Care and Additional Recommendations: Limitations on Scope of Treatment: Full Comfort Care  Code Status:    Code Status  Orders  (From admission, onward)           Start     Ordered   06/05/22 1232  Do not attempt resuscitation (DNR)  Continuous       Question Answer Comment  If patient has no pulse and is not breathing Do Not Attempt Resuscitation   If patient has a pulse and/or is breathing: Medical Treatment Goals COMFORT MEASURES: Keep clean/warm/dry, use medication by any route; positioning, wound care and other measures to relieve pain/suffering; use oxygen, suction/manual treatment of airway obstruction for comfort; do not transfer unless for comfort needs.   Consent: Discussion documented in EHR or advanced directives reviewed      06/05/22 1237           Code Status History     Date Active Date Inactive Code Status Order ID Comments User Context   05/30/2022 0320 06/05/2022 1237 DNR [id number]  Shela Leff, MD Inpatient   09/27/2019 0446 09/28/2019 2216 Full Code [id number]  Vianne Bulls, MD Inpatient   01/18/2012 1653 02/09/2012 1532 Full Code [id number]  Mliss Sax, RN Inpatient   01/13/2012 0319 01/18/2012 1653 Full Code [id number]  Pecola Leisure, RN Inpatient      Advance Directive Documentation    Flowsheet Row Most Recent Value  Type of Advance Directive Healthcare Power of Attorney, Living will  Pre-existing out of facility DNR order (yellow form or pink MOST form) --  "MOST" Form in Place? --       Prognosis:  Weeks  Discharge Planning: To Be Determined  Care plan was discussed with primary RN, Dr. Starla Link, [facility]  Thank you for allowing the Palliative Medicine Team to assist in the  care of this patient.  Lin Landsman, NP  Please contact Palliative Medicine Team phone at [phone] for questions and concerns.   *Portions of this note are a verbal dictation therefore any spelling and/or grammatical errors are due to the "[place] One" system interpretation.

## 2022-06-06 NOTE — Plan of Care (Signed)
  Problem: Education: Goal: Knowledge of General Education information will improve Description: Including pain rating scale, medication(s)/side effects and non-pharmacologic comfort measures Outcome: Progressing   Problem: Health Behavior/Discharge Planning: Goal: Ability to manage health-related needs will improve Outcome: Progressing

## 2022-06-06 NOTE — TOC CM/SW Note (Addendum)
Palliative Care has left patient's wife a message .   PC spoke to grand daughter . [name] is planning on visiting today. PC will come back to see patient

## 2022-06-07 DIAGNOSIS — J9601 Acute respiratory failure with hypoxia: Secondary | ICD-10-CM | POA: Diagnosis not present

## 2022-06-07 DIAGNOSIS — R0902 Hypoxemia: Secondary | ICD-10-CM | POA: Diagnosis not present

## 2022-06-07 DIAGNOSIS — U071 COVID-19: Secondary | ICD-10-CM | POA: Diagnosis not present

## 2022-06-07 DIAGNOSIS — Z7189 Other specified counseling: Secondary | ICD-10-CM | POA: Diagnosis not present

## 2022-06-07 DIAGNOSIS — W19XXXA Unspecified fall, initial encounter: Secondary | ICD-10-CM | POA: Diagnosis not present

## 2022-06-07 DIAGNOSIS — Z789 Other specified health status: Secondary | ICD-10-CM | POA: Diagnosis not present

## 2022-06-07 DIAGNOSIS — Z66 Do not resuscitate: Secondary | ICD-10-CM

## 2022-06-07 DIAGNOSIS — Z515 Encounter for palliative care: Secondary | ICD-10-CM | POA: Diagnosis not present

## 2022-06-07 DIAGNOSIS — J189 Pneumonia, unspecified organism: Secondary | ICD-10-CM | POA: Diagnosis not present

## 2022-06-07 MED ORDER — HYDROCORTISONE 0.5 % EX CREA
TOPICAL_CREAM | Freq: Four times a day (QID) | CUTANEOUS | Status: DC | PRN
  Filled 2022-06-07: qty 28.35

## 2022-06-07 NOTE — Progress Notes (Addendum)
Brief Palliative Medicine Progress Note:  Received notification from hospice liaison/Shanita that hospice MD wished to discuss patient's case.  Called hospice MD - discussed patient's case and current clinical situation in detail including labs, vitals, consult notes, progress notes, physical assessment. ACC will continue to evaluate patient for appropriateness of IPU bed.  Thank you for allowing PMT to assist in the care of this patient.  Zandria Woldt M. Tamala Julian, FNP-BC Palliative Medicine Team Team Phone: [phone] 15 minutes Greater than 50%  of this time was spent counseling and coordinating care related to the above assessment and plan.

## 2022-06-07 NOTE — Progress Notes (Signed)
Manufacturing engineer [facility]) Hospital Liaison Note  Referral received for hospice home. Chart under review by [facility] physician.   Hospice eligibility pending.   Unfortunately, this patient does not meet criteria for IPU. Please call with any questions or concerns. Thank you  Roselee Nova, [facility] Liaison [phone]

## 2022-06-07 NOTE — Hospital Course (Signed)
Mr. Schwegler is a 89 y.o. male with PMH CAD, HTN, HLD, nephrolithiasis, DM II, PAF, dementia who was brought to the hospital for abdominal/right groin pain and and a fall out of bed.  He was found to be hypoxic on room air requiring oxygen. CTA chest was negative for PE and showed bilateral lower lobe atelectasis/scarring and concern for developing infiltrates in the left lower lobe. He was also found to be positive for COVID-19.  He was started on remdesivir and Unasyn on admission. He also underwent workup for dry gangrene involving left toes.

## 2022-06-07 NOTE — Progress Notes (Addendum)
Daily Progress Note   Patient Name: Richard Kent       Date: 06/07/2022 DOB: 1928/08/10  Age: 89 y.o. MRN#: [id number] Attending Physician: Dwyane Dee, MD Primary Care Physician: Denita Lung, MD Admit Date: 05/29/2022  Reason for Consultation/Follow-up: Establishing goals of care  Subjective: I have reviewed medical records including EPIC notes and labs. Received report from primary RN - no acute concerns.   Went to visit patient at bedside - no family/visitors present. Patient was lying in bed asleep - I did not attempt to wake him to preserve comfort. No signs or non-verbal gestures of pain or discomfort noted. No respiratory distress, increased work of breathing, or secretions noted.   Called wife/[name] - emotional support provided. Therapeutic listening provided as she reflects on how hard the last several days have been. Reviewed discharge planning conversation had with my colleague on Monday - wife reiterates she is very hopeful for patient's discharge to [facility]. Provided updates that, unfortunately, [facility] is not accepting new patient's at this time due to renovations. She is very sad to hear this. Reviewed with [name] that patient is likely not residential hospice appropriate. We discussed other disposition options to include: LTC or home with family/private duty caregivers with hospice; however, she is still interested in residential option. Reviewed [facility] has another facility in [place] vs looking at other hospice organizations. She requests I speak with either her daughter/ [name] or granddaughter/Chelsea  as she "will likely not remember this information from today."  Attempted to call daughter/Kelley without success.  Called granddaughter/Chelsea - reviewed all  information as outlined above and [name]'s wish for her assistance in decision making. Chelsea was with [name] and I then spoke with both via speakerphone. They wish to send referral to [facility] location in [place] at this time.  All questions and concerns addressed. Encouraged to call with questions and/or concerns. PMT number provided.  Discussed information as above with TOC and hospice liaison.   After review, per hospice liaison, patient was not approved for residential placement. She explains family is interested in exploring other hospice facilities.   3:00 PM Discussed further in detail with TOC. Even though it is still questionable if patient would be approved, recommended referral to [facility].   Length of Stay: 9  Current Medications: Scheduled Meds:  antiseptic oral rinse  15 mL Topical BID   furosemide  10 mg Oral Daily   gabapentin  400 mg Oral BID   morphine CONCENTRATE  5 mg Oral Q6H    Continuous Infusions:   PRN Meds: acetaminophen **OR** acetaminophen, bisacodyl, glycopyrrolate **OR** glycopyrrolate, haloperidol **OR** haloperidol **OR** haloperidol lactate, LORazepam **OR** LORazepam, morphine injection, ondansetron **OR** ondansetron (ZOFRAN) IV, polyvinyl alcohol, senna  Physical Exam Vitals and nursing note reviewed.  Constitutional:      General: He is not in acute distress.    Appearance: He is ill-appearing.  Pulmonary:     Effort: No respiratory distress.  Skin:    General: Skin is warm and dry.  Neurological:     Mental Status: He is lethargic.     Motor: Weakness present.             Vital Signs: BP 139/86   Pulse 65   Temp 98.6 F (37 C)   Resp 18   Ht 6' (1.829 m)   Wt 84.9 kg   SpO2 97%   BMI 25.38 kg/m  SpO2: SpO2: 97 % O2 Device: O2 Device: Room Air O2 Flow Rate: O2 Flow Rate (L/min): 2 L/min  Intake/output summary:  Intake/Output Summary (Last 24 hours) at 06/07/2022 1009 Last data filed at 06/07/2022  0700 Gross per 24 hour  Intake 150 ml  Output 1600 ml  Net -1450 ml   LBM: Last BM Date : 06/06/22 Baseline Weight: Weight: 82.8 kg Most recent weight: Weight: 84.9 kg       Palliative Assessment/Data: PPS 30%      Patient Active Problem List   Diagnosis Date Noted   Comfort measures only status 06/06/2022   PVD (peripheral vascular disease) ([place]) 06/02/2022   COVID-19 virus infection 05/30/2022   Acute hypoxemic respiratory failure ([place]) 05/30/2022   CAD (coronary artery disease) 05/30/2022   Pneumonia 05/29/2022   Basal cell carcinoma 05/24/2022   Atrial fibrillation ([place]) 05/19/2022   Type II diabetes mellitus with complication ([place]) 96/22/2979   New onset type 2 diabetes mellitus ([place]) 09/29/2021   Aortic atherosclerosis ([place]) 09/19/2021   Diarrhea 09/27/2019   Senile purpura ([place]) 01/09/2017   Carotid stenosis, bilateral 01/14/2012   Hypertension 01/12/2012   Hyperlipidemia LDL goal <100 12/17/2009   Hemiplegia, late effect of cerebrovascular disease ([place]) 12/17/2009   RBBB 10/21/2009    Palliative Care Assessment & Plan   Patient Profile: 89 y.o. male  with past medical history of  CAD, hypertension, hyperlipidemia, nephrolithiasis, type 2 diabetes, paroxysmal A-fib on Eliquis, dementia, bilateral lower extremity lymphedema/venous stasis dermatitis admitted on 05/29/2022 with abdominal/right groin pain following an unwitnessed fall out of his bed.    Patient is admitted for acute hypoxemic respiratory failure secondary to COVID-19 infection and left lower lobe pneumonia, concerning for aspiration. PMT has been consulted to assist with goals of care conversation.  Assessment: Principal Problem:   COVID-19 virus infection Active Problems:   Hyperlipidemia LDL goal <100   Hypertension   Type II diabetes mellitus with complication (HCC)   Atrial fibrillation (HCC)   Pneumonia   Acute hypoxemic respiratory failure (HCC)   CAD (coronary artery disease)   PVD  (peripheral vascular disease) (HCC)   Comfort measures only status     Recommendations/Plan: Continue full comfort measures Continue DNR/DNI as previously documented Family interested in residential hospice placement. He was not approved for [facility] - family wish to consider other residential hospice organizations. Patient is likely not residential hospice appropriate at  this time. Even though it would be questionable if patient would be approved, recommend referral to [facility] or [facility] home. Appreciate TOC assistance Continue current comfort focused medication regimen - no changes today PMT will continue to follow and support holistically  Symptom Management Continue morphine q6h; continue PRN doses for breakthrough symptoms  Tylenol PRN pain/fever Biotin twice daily Robinul PRN secretions Haldol PRN agitation/delirium Ativan PRN anxiety/seizure/sleep/distress Zofran PRN nausea/vomiting Liquifilm Tears PRN dry eye Lasix daily Senna and dulcolax PRN constipation  Goals of Care and Additional Recommendations: Limitations on Scope of Treatment: Full Comfort Care  Code Status:    Code Status Orders  (From admission, onward)           Start     Ordered   06/05/22 1232  Do not attempt resuscitation (DNR)  Continuous       Question Answer Comment  If patient has no pulse and is not breathing Do Not Attempt Resuscitation   If patient has a pulse and/or is breathing: Medical Treatment Goals COMFORT MEASURES: Keep clean/warm/dry, use medication by any route; positioning, wound care and other measures to relieve pain/suffering; use oxygen, suction/manual treatment of airway obstruction for comfort; do not transfer unless for comfort needs.   Consent: Discussion documented in EHR or advanced directives reviewed      06/05/22 1237           Code Status History     Date Active Date Inactive Code Status Order ID Comments User Context   05/30/2022 0320  06/05/2022 1237 DNR [id number]  Shela Leff, MD Inpatient   09/27/2019 0446 09/28/2019 2216 Full Code [id number]  Vianne Bulls, MD Inpatient   01/18/2012 1653 02/09/2012 1532 Full Code [id number]  Mliss Sax, RN Inpatient   01/13/2012 0319 01/18/2012 1653 Full Code [id number]  Pecola Leisure, RN Inpatient      Advance Directive Documentation    Flowsheet Row Most Recent Value  Type of Advance Directive Healthcare Power of Attorney, Living will  Pre-existing out of facility DNR order (yellow form or pink MOST form) --  "MOST" Form in Place? --       Prognosis:  Poor  Discharge Planning: To Be Determined  Care plan was discussed with primary RN, patient's family, TOC, Dr. Sabino Gasser, hospice liaison  Thank you for allowing the Palliative Medicine Team to assist in the care of this patient.   Total Time 65 minutes Prolonged Time Billed  yes      Greater than 50%  of this time was spent counseling and coordinating care related to the above assessment and plan.  Lin Landsman, NP  Please contact Palliative Medicine Team phone at [phone] for questions and concerns.   *Portions of this note are a verbal dictation therefore any spelling and/or grammatical errors are due to the "[place] [employer]" system interpretation.

## 2022-06-07 NOTE — TOC Progression Note (Signed)
Transition of Care [facility]) - Progression Note    Patient Details  Name: Richard Kent MRN: [id number] Date of Birth: 1928-06-09  Transition of Care [facility]) CM/SW [place], [place] Phone Number: 06/07/2022, 3:39 PM  Clinical Narrative:    TOC continues to follow along for pt disposition. Per palliative family still interested in Residential hospice. Pt is not meeting criteria for placement in any of the hospice facilities. Many of the facilities require for a pt to transfer out if they improve. CSW sent referral to [facility]) to review at [employer] request. At this time pt cannot DC to SNF, as he is comfort care, does not have a payor source for LTC, is not residential hospice appropriate, and family is not sure about taking him home. CSW consulted [facility] leadership for additional input. TOC will continue to follow for DC needs.    Expected Discharge Plan: [place] Barriers to Discharge: Continued Medical Work up, SNF Pending bed offer  Expected Discharge Plan and Services     Post Acute Care Choice: Advance Living arrangements for the past 2 months: Single Family Home                                       Social Determinants of Health (SDOH) Interventions SDOH Screenings   Food Insecurity: No Food Insecurity (05/30/2022)  Housing: Low Risk  (05/30/2022)  Transportation Needs: No Transportation Needs (05/30/2022)  Utilities: Not At Risk (05/30/2022)  Depression (PHQ2-9): Low Risk  (09/09/2021)  Financial Resource Strain: Low Risk  (09/09/2021)  Physical Activity: Sufficiently Active (09/09/2021)  Stress: No Stress Concern Present (09/09/2021)  Tobacco Use: Medium Risk (05/31/2022)    Readmission Risk Interventions    09/28/2019    2:22 PM  Readmission Risk Prevention Plan  Post Dischage Appt Complete  Medication Screening Complete  Transportation Screening Complete

## 2022-06-07 NOTE — Progress Notes (Signed)
Progress Note    Richard Kent   PNT:[id number]  DOB: Mar 09, 1929  DOA: 05/29/2022     9 PCP: Denita Lung, MD  Initial CC: Fall at [facility] Course: Richard Kent is a 89 y.o. male with PMH CAD, HTN, HLD, nephrolithiasis, DM II, PAF, dementia who was brought to the hospital for abdominal/right groin pain and and a fall out of bed.  He was found to be hypoxic on room air requiring oxygen. CTA chest was negative for PE and showed bilateral lower lobe atelectasis/scarring and concern for developing infiltrates in the left lower lobe. He was also found to be positive for COVID-19.  He was started on remdesivir and Unasyn on admission. He also underwent workup for dry gangrene involving left toes.   Interval History:  Resting in bed with wife and grandchildren bedside.  Patient demented and hard of hearing but appeared to be in no distress.  Assessment and Plan:  Comfort measures only status  Acute respiratory failure with hypoxia COVID-19 infection Left lower lobe pneumonia: COVID 19 pneumonia with possibly combination of aspiration pneumonia CAD Chronic bilateral lower extremity edema/lymphedema Gangrenous changes of toes of left foot/peripheral arterial disease Hypertension Leukocytosis Elevated LFTs Diabetes mellitus type 2 with neuropathy Paroxysmal A-fib Dementia Goals of care Physical deconditioning   Plan -As discussed above, family decided to pursue comfort measures on 06/05/2022.  Palliative care following.  Possible residential hospice placement - not a candidate for SNF or home at this time - remains inpatient until either improves/safe enough for SNF discharge (not likely) vs further anticipated decline to warrant residential hospice    Old records reviewed in assessment of this patient  Antimicrobials: Azithromycin 05/29/2022 x 1 Rocephin 05/29/2022 x 1 Remdesivir 05/30/2022 >> 06/01/2022 Unasyn 05/30/2022 >> 06/03/2022  DVT prophylaxis:   Eliquis, now  discontinued in setting of comfort care  Code Status:   Code Status: DNR  Mobility Assessment (last 72 hours)     Mobility Assessment     Row Name 06/06/22 1957 06/06/22 1021 06/06/22 0800 06/05/22 2117 06/05/22 0715   Does patient have an order for bedrest or is patient medically unstable No - Continue assessment No - Continue assessment No - Continue assessment No - Continue assessment No - Continue assessment   What is the highest level of mobility based on the progressive mobility assessment? Level 2 ([place]) - Balance while sitting on edge of bed and cannot stand Level 2 ([place]) - Balance while sitting on edge of bed and cannot stand -- Level 2 ([place]) - Balance while sitting on edge of bed and cannot stand Level 2 ([place]) - Balance while sitting on edge of bed and cannot stand   Is the above level different from baseline mobility prior to current illness? No - Consider discontinuing PT/OT -- -- No - Consider discontinuing PT/OT No - Consider discontinuing PT/OT    Row Name 06/04/22 2000           Does patient have an order for bedrest or is patient medically unstable No - Continue assessment       What is the highest level of mobility based on the progressive mobility assessment? Level 2 ([place]) - Balance while sitting on edge of bed and cannot stand       Is the above level different from baseline mobility prior to current illness? Yes - Recommend PT order                Barriers to discharge:  Disposition Plan: Unknown, tentatively residential hospice Status is: Inpt  Objective: Blood pressure 139/86, pulse 65, temperature 98.6 F (37 C), resp. rate 18, height 6' (1.829 m), weight 84.9 kg, SpO2 97 %.  Examination:  Physical Exam Constitutional:      Comments: Pleasantly demented elderly gentleman lying in bed in no obvious distress  HENT:     Head: Normocephalic and atraumatic.     Mouth/Throat:     Mouth: Mucous membranes are moist.  Eyes:      Extraocular Movements: Extraocular movements intact.  Cardiovascular:     Rate and Rhythm: Normal rate and regular rhythm.  Pulmonary:     Effort: Pulmonary effort is normal.     Breath sounds: Normal breath sounds.  Abdominal:     General: Bowel sounds are normal. There is no distension.     Palpations: Abdomen is soft.     Tenderness: There is no abdominal tenderness.  Musculoskeletal:     Cervical back: Normal range of motion and neck supple.     Comments: Dry gangrene appreciated in left toes  Skin:    General: Skin is warm and dry.  Neurological:     Comments: Baseline dementia appreciated      Consultants:  Palliative care  Procedures:    Data Reviewed: No results found for this or any previous visit (from the past 24 hour(s)).  I have reviewed pertinent nursing notes, vitals, labs, and images as necessary. I have ordered labwork to follow up on as indicated.  I have reviewed the last notes from staff over past 24 hours. I have discussed patient's care plan and test results with nursing staff, CM/SW, and other staff as appropriate.  Time spent: Greater than 50% of the 55 minute visit was spent in counseling/coordination of care for the patient as laid out in the A&P.   LOS: 9 days   Dwyane Dee, MD [facility] 06/07/2022, 7:18 PM

## 2022-06-08 DIAGNOSIS — U071 COVID-19: Secondary | ICD-10-CM | POA: Diagnosis not present

## 2022-06-08 DIAGNOSIS — J9601 Acute respiratory failure with hypoxia: Secondary | ICD-10-CM | POA: Diagnosis not present

## 2022-06-08 DIAGNOSIS — W19XXXA Unspecified fall, initial encounter: Secondary | ICD-10-CM | POA: Diagnosis not present

## 2022-06-08 DIAGNOSIS — R0902 Hypoxemia: Secondary | ICD-10-CM | POA: Diagnosis not present

## 2022-06-08 DIAGNOSIS — Z515 Encounter for palliative care: Secondary | ICD-10-CM | POA: Diagnosis not present

## 2022-06-08 DIAGNOSIS — J189 Pneumonia, unspecified organism: Secondary | ICD-10-CM | POA: Diagnosis not present

## 2022-06-08 NOTE — TOC CM/SW Note (Addendum)
[facility]  regarding benefits, provider, [place] social worker , awaiting call back.   Richard Kent at [facility] called back. Patient receives his care at [facility]. Kent instructed NCM to call Gemma Payor [phone] ext [phone] to receive provider name, SW name and benefit information. NCM called Gemma Payor [phone] ext [phone] and left a message   Referral was sent to [facility]) . However, [facility]) did not response in the HUB NCM called to follow up , left voicemail .

## 2022-06-08 NOTE — Progress Notes (Signed)
Daily Progress Note   Patient Name: Richard Kent       Date: 06/08/2022 DOB: July 11, 1928  Age: 89 y.o. MRN#: [id number] Attending Physician: Dwyane Dee, MD Primary Care Physician: Denita Lung, MD Admit Date: 05/29/2022  Reason for Consultation/Follow-up: Non pain symptom management, Pain control, Psychosocial/spiritual support, and Terminal Care  Subjective: I have reviewed medical records including EPIC notes and labs. Received report from primary RN - no acute concerns. RN reports patient ate about 50% of breakfast.  Went to visit patient at bedside - no family/visitors present. Patient was lying in bed awake and pleasantly confused. No signs or non-verbal gestures of pain or discomfort noted. No respiratory distress, increased work of breathing, or secretions noted. He is on room air.   Called granddaughter/Chelsea - emotional support provided. Family have been discussing discharge planning. Reviewed options of home with hospice +/- private duty caregivers, LTC with hospice (reviewed this would likely be an OOP cost), considering another residential hospice referral knowing he may not be accepted. Family will be looking into patient's VA benefits and will make stepwise decisions pending this information. Vikki Ports does state that family "had a hard time caring for him at home" as he "was more than we could manage" trying to get him to the bathroom/ambulate. Reviewed that patient is likely not at the point he will ambulate and would be total care in the bed. Reviewed hospice services to assist with bathing and hygiene, but family would be responsible for cleaning skin intermittently between their visits if patient was soiled. Vikki Ports will discuss information as above with [name] and [name].   All  questions and concerns addressed. Encouraged to call with questions and/or concerns. PMT number previously provided.  Length of Stay: 10  Current Medications: Scheduled Meds:   antiseptic oral rinse  15 mL Topical BID   furosemide  10 mg Oral Daily   gabapentin  400 mg Oral BID   morphine CONCENTRATE  5 mg Oral Q6H    Continuous Infusions:   PRN Meds: acetaminophen **OR** acetaminophen, bisacodyl, glycopyrrolate **OR** glycopyrrolate, haloperidol **OR** haloperidol **OR** haloperidol lactate, hydrocortisone cream, LORazepam **OR** LORazepam, morphine injection, ondansetron **OR** ondansetron (ZOFRAN) IV, polyvinyl alcohol, senna  Physical Exam Vitals and nursing note reviewed.  Constitutional:      General: He is not in acute distress.  Appearance: He is ill-appearing.  Pulmonary:     Effort: No respiratory distress.  Skin:    General: Skin is warm and dry.  Neurological:     Mental Status: He is alert. He is confused.     Motor: Weakness present.             Vital Signs: BP (!) 143/85 (BP Location: Right Arm)   Pulse 80   Temp 98.5 F (36.9 C) (Oral)   Resp 18   Ht 6' (1.829 m)   Wt 84.9 kg   SpO2 97%   BMI 25.38 kg/m  SpO2: SpO2: 97 % O2 Device: O2 Device: Room Air O2 Flow Rate: O2 Flow Rate (L/min): 2 L/min  Intake/output summary:  Intake/Output Summary (Last 24 hours) at 06/08/2022 1011 Last data filed at 06/08/2022 8921 Gross per 24 hour  Intake 440 ml  Output 950 ml  Net -510 ml   LBM: Last BM Date : 06/06/22 Baseline Weight: Weight: 82.8 kg Most recent weight: Weight: 84.9 kg       Palliative Assessment/Data: PPS 30%      Patient Active Problem List   Diagnosis Date Noted   Comfort measures only status 06/06/2022   PVD (peripheral vascular disease) ([place]) 06/02/2022   COVID-19 virus infection 05/30/2022   Acute hypoxemic respiratory failure ([place]) 05/30/2022   CAD (coronary artery disease) 05/30/2022   Pneumonia 05/29/2022   Basal cell  carcinoma 05/24/2022   Atrial fibrillation ([place]) 05/19/2022   Type II diabetes mellitus with complication ([place]) 19/41/7408   New onset type 2 diabetes mellitus ([place]) 09/29/2021   Aortic atherosclerosis ([place]) 09/19/2021   Diarrhea 09/27/2019   Senile purpura ([place]) 01/09/2017   Carotid stenosis, bilateral 01/14/2012   Hypertension 01/12/2012   Hyperlipidemia LDL goal <100 12/17/2009   Hemiplegia, late effect of cerebrovascular disease ([place]) 12/17/2009   RBBB 10/21/2009    Palliative Care Assessment & Plan   Patient Profile: 89 y.o. male  with past medical history of  CAD, hypertension, hyperlipidemia, nephrolithiasis, type 2 diabetes, paroxysmal A-fib on Eliquis, dementia, bilateral lower extremity lymphedema/venous stasis dermatitis admitted on 05/29/2022 with abdominal/right groin pain following an unwitnessed fall out of his bed.    Patient is admitted for acute hypoxemic respiratory failure secondary to COVID-19 infection and left lower lobe pneumonia, concerning for aspiration. PMT has been consulted to assist with goals of care conversation.  Assessment: Principal Problem:   COVID-19 virus infection Active Problems:   Hyperlipidemia LDL goal <100   Hypertension   Type II diabetes mellitus with complication (HCC)   Atrial fibrillation (HCC)   Pneumonia   Acute hypoxemic respiratory failure (HCC)   CAD (coronary artery disease)   PVD (peripheral vascular disease) (HCC)   Comfort measures only status     Recommendations/Plan: Continue full comfort measures Continue DNR/DNI as previously documented Family continue to consider/discuss all disposition options. They are currently looking into his VA benefits Continue current comfort focused medication regimen - no changes today PMT will continue to follow and support holistically  Symptom Management Continue morphine q6h; continue PRN doses for breakthrough symptoms  Tylenol PRN pain/fever Biotin twice daily Robinul PRN  secretions Haldol PRN agitation/delirium Ativan PRN anxiety/seizure/sleep/distress Zofran PRN nausea/vomiting Liquifilm Tears PRN dry eye Lasix daily Senna and dulcolax PRN constipation  Goals of Care and Additional Recommendations: Limitations on Scope of Treatment: Full Comfort Care  Code Status:    Code Status Orders  (From admission, onward)  Start     Ordered   06/05/22 1232  Do not attempt resuscitation (DNR)  Continuous       Question Answer Comment  If patient has no pulse and is not breathing Do Not Attempt Resuscitation   If patient has a pulse and/or is breathing: Medical Treatment Goals COMFORT MEASURES: Keep clean/warm/dry, use medication by any route; positioning, wound care and other measures to relieve pain/suffering; use oxygen, suction/manual treatment of airway obstruction for comfort; do not transfer unless for comfort needs.   Consent: Discussion documented in EHR or advanced directives reviewed      06/05/22 1237           Code Status History     Date Active Date Inactive Code Status Order ID Comments User Context   05/30/2022 0320 06/05/2022 1237 DNR [id number]  Shela Leff, MD Inpatient   09/27/2019 0446 09/28/2019 2216 Full Code [id number]  Vianne Bulls, MD Inpatient   01/18/2012 1653 02/09/2012 1532 Full Code [id number]  Mliss Sax, RN Inpatient   01/13/2012 0319 01/18/2012 1653 Full Code [id number]  Pecola Leisure, RN Inpatient      Advance Directive Documentation    Flowsheet Row Most Recent Value  Type of Advance Directive Healthcare Power of Attorney, Living will  Pre-existing out of facility DNR order (yellow form or pink MOST form) --  "MOST" Form in Place? --       Prognosis:  Poor  Discharge Planning: To Be Determined  Care plan was discussed with primary RN, patient's granddaughter  Thank you for allowing the Palliative Medicine Team to assist in the care of this patient.  Lin Landsman, NP  Please contact  Palliative Medicine Team phone at [phone] for questions and concerns.   *Portions of this note are a verbal dictation therefore any spelling and/or grammatical errors are due to the "[place]" system interpretation.

## 2022-06-08 NOTE — TOC Progression Note (Signed)
Transition of Care [facility]) - Progression Note    Patient Details  Name: Richard Kent MRN: [id number] Date of Birth: December 11, 1928  Transition of Care [facility] CM/SW [place], [place] Phone Number: 06/08/2022, 2:34 PM  Clinical Narrative:    CSW reviewed palliatives note with family deciding on disposition. CSW followed up with Evlyn Clines [facility]) have not received a return call or a response to referral yet. Pt unlikely to meet criteria at this time. CSW spoke with home hospice companies and [facility] will follow for d/c needs if going home. Palliative noting family to follow up w/ VA benefits. CM to reach out to [place] for additional assistance. TOC to continue to follow palliative in assisting this family.    Expected Discharge Plan: [place] Barriers to Discharge: Continued Medical Work up, SNF Pending bed offer  Expected Discharge Plan and Services     Post Acute Care Choice: [place] Living arrangements for the past 2 months: Single Family Home                                       Social Determinants of Health (SDOH) Interventions SDOH Screenings   Food Insecurity: No Food Insecurity (05/30/2022)  Housing: Low Risk  (05/30/2022)  Transportation Needs: No Transportation Needs (05/30/2022)  Utilities: Not At Risk (05/30/2022)  Depression (PHQ2-9): Low Risk  (09/09/2021)  Financial Resource Strain: Low Risk  (09/09/2021)  Physical Activity: Sufficiently Active (09/09/2021)  Stress: No Stress Concern Present (09/09/2021)  Tobacco Use: Medium Risk (05/31/2022)    Readmission Risk Interventions    09/28/2019    2:22 PM  Readmission Risk Prevention Plan  Post Dischage Appt Complete  Medication Screening Complete  Transportation Screening Complete

## 2022-06-08 NOTE — Progress Notes (Signed)
Progress Note    Richard Kent   SEG:[id number]  DOB: 1929-02-03  DOA: 05/29/2022     10 PCP: Denita Lung, MD  Initial CC: Fall at [facility] Course: Richard Kent is a 89 y.o. male with PMH CAD, HTN, HLD, nephrolithiasis, DM II, PAF, dementia who was brought to the hospital for abdominal/right groin pain and and a fall out of bed.  He was found to be hypoxic on room air requiring oxygen. CTA chest was negative for PE and showed bilateral lower lobe atelectasis/scarring and concern for developing infiltrates in the left lower lobe. He was also found to be positive for COVID-19.  He was started on remdesivir and Unasyn on admission. He also underwent workup for dry gangrene involving left toes.   Interval History:  No events overnight.  Resting in bed when seen and pleasantly confused.  Remains on room air.  Assessment and Plan:  Comfort measures only status  Acute respiratory failure with hypoxia COVID-19 infection Left lower lobe pneumonia: COVID 19 pneumonia with possibly combination of aspiration pneumonia CAD Chronic bilateral lower extremity edema/lymphedema Gangrenous changes of toes of left foot/peripheral arterial disease Hypertension Leukocytosis Elevated LFTs Diabetes mellitus type 2 with neuropathy Paroxysmal A-fib Dementia Goals of care Physical deconditioning   Plan -As discussed above, family decided to pursue comfort measures on 06/05/2022.  Palliative care following.  Possible residential hospice placement - not a candidate for SNF or home at this time - remains inpatient until either improves/safe enough for SNF discharge (not likely) vs further anticipated decline to warrant residential hospice    Old records reviewed in assessment of this patient  Antimicrobials: Azithromycin 05/29/2022 x 1 Rocephin 05/29/2022 x 1 Remdesivir 05/30/2022 >> 06/01/2022 Unasyn 05/30/2022 >> 06/03/2022  DVT prophylaxis:   Eliquis, now discontinued in setting of comfort  care  Code Status:   Code Status: DNR  Mobility Assessment (last 72 hours)     Mobility Assessment     Row Name 06/07/22 2000 06/06/22 1957 06/06/22 1021 06/06/22 0800 06/05/22 2117   Does patient have an order for bedrest or is patient medically unstable No - Continue assessment No - Continue assessment No - Continue assessment No - Continue assessment No - Continue assessment   What is the highest level of mobility based on the progressive mobility assessment? Level 2 (Chair[place]) - Balance while sitting on edge of bed and cannot stand Level 2 (Chairfast) - Balance while sitting on edge of bed and cannot stand Level 2 (Chairfast) - Balance while sitting on edge of bed and cannot stand -- Level 2 (Chairfast) - Balance while sitting on edge of bed and cannot stand   Is the above level different from baseline mobility prior to current illness? No - Consider discontinuing PT/OT No - Consider discontinuing PT/OT -- -- No - Consider discontinuing PT/OT            Barriers to discharge:  Disposition Plan: Unknown, tentatively residential hospice Status is: Inpt  Objective: Blood pressure 132/87, pulse 74, temperature 97.9 F (36.6 C), temperature source Oral, resp. rate 18, height 6' (1.829 m), weight 84.9 kg, SpO2 96 %.  Examination:  Physical Exam Constitutional:      Comments: Pleasantly demented elderly gentleman lying in bed in no obvious distress  HENT:     Head: Normocephalic and atraumatic.     Mouth/Throat:     Mouth: Mucous membranes are moist.  Eyes:     Extraocular Movements: Extraocular movements intact.  Cardiovascular:  Rate and Rhythm: Normal rate and regular rhythm.  Pulmonary:     Effort: Pulmonary effort is normal.     Breath sounds: Normal breath sounds.  Abdominal:     General: Bowel sounds are normal. There is no distension.     Palpations: Abdomen is soft.     Tenderness: There is no abdominal tenderness.  Musculoskeletal:     Cervical back: Normal  range of motion and neck supple.     Comments: Dry gangrene appreciated in left toes  Skin:    General: Skin is warm and dry.  Neurological:     Comments: Baseline dementia appreciated      Consultants:  Palliative care  Procedures:    Data Reviewed: No results found for this or any previous visit (from the past 24 hour(s)).  I have reviewed pertinent nursing notes, vitals, labs, and images as necessary. I have ordered labwork to follow up on as indicated.  I have reviewed the last notes from staff over past 24 hours. I have discussed patient's care plan and test results with nursing staff, CM/SW, and other staff as appropriate.    LOS: 10 days   Dwyane Dee, MD [facility] 06/08/2022, 5:26 PM

## 2022-06-09 DIAGNOSIS — Z515 Encounter for palliative care: Secondary | ICD-10-CM | POA: Diagnosis not present

## 2022-06-09 DIAGNOSIS — U071 COVID-19: Secondary | ICD-10-CM | POA: Diagnosis not present

## 2022-06-09 DIAGNOSIS — J9601 Acute respiratory failure with hypoxia: Secondary | ICD-10-CM | POA: Diagnosis not present

## 2022-06-09 DIAGNOSIS — J189 Pneumonia, unspecified organism: Secondary | ICD-10-CM | POA: Diagnosis not present

## 2022-06-09 MED ORDER — SODIUM CHLORIDE 0.9 % IV SOLN
12.5000 mg | Freq: Four times a day (QID) | INTRAVENOUS | Status: AC | PRN
  Administered 2022-06-10: 12.5 mg via INTRAVENOUS
  Filled 2022-06-09: qty 0.5

## 2022-06-09 MED ORDER — SODIUM CHLORIDE 0.9 % IV SOLN
12.5000 mg | Freq: Four times a day (QID) | INTRAVENOUS | Status: AC | PRN
  Administered 2022-06-09: 12.5 mg via INTRAVENOUS
  Filled 2022-06-09: qty 0.5

## 2022-06-09 NOTE — TOC CM/SW Note (Addendum)
Followed up with Evlyn Clines [facility]) Hospice unfortunately,  patient does not meet criteria for residential hospice ,.   Talked to grand daughter Rickard Rhymes [phone] . She is aware residential hospice denied him and why. She said they cannot take home. She wants to discuss private pay at a SNF . Also if VA would assist. NCM  called VA yesterday to get PCP , SW etc info have not heard back. [place] daughter said they are doing VA paperwork to establish patient with VA . TOC SW aware . SW called grand daughter

## 2022-06-09 NOTE — Progress Notes (Addendum)
Daily Progress Note   Patient Name: Richard Kent       Date: 06/09/2022 DOB: 04/10/1929  Age: 89 y.o. MRN#: [id number] Attending Physician: Dwyane Dee, MD Primary Care Physician: Denita Lung, MD Admit Date: 05/29/2022  Reason for Consultation/Follow-up: Establishing goals of care  Subjective: I have reviewed medical records including EPIC notes and labs. Received report from primary RN - no acute concerns.  Patient is resting, RN states he did not receive liquid morphine this morning due to lethargy.  Did not attempt to arouse in order to preserve comfort.  No family present during my visit.  Length of Stay: 11  Physical Exam Vitals and nursing note reviewed.  Constitutional:      General: He is not in acute distress.    Appearance: He is ill-appearing.  Pulmonary:     Effort: No respiratory distress.  Skin:    General: Skin is warm and dry.  Neurological:     Mental Status: He is lethargic.     Motor: Weakness present.            Vital Signs: BP 130/82 (BP Location: Left Arm)   Pulse (!) 104   Temp 98.7 F (37.1 C) (Oral)   Resp 18   Ht 6' (1.829 m)   Wt 84.9 kg   SpO2 91%   BMI 25.38 kg/m  SpO2: SpO2: 91 % O2 Device: O2 Device: Room Air O2 Flow Rate: O2 Flow Rate (L/min): 2 L/min       Palliative Assessment/Data: PPS 30%   Palliative Care Assessment & Plan   Patient Profile: 89 y.o. male  with past medical history of  CAD, hypertension, hyperlipidemia, nephrolithiasis, type 2 diabetes, paroxysmal A-fib on Eliquis, dementia, bilateral lower extremity lymphedema/venous stasis dermatitis admitted on 05/29/2022 with abdominal/right groin pain following an unwitnessed fall out of his bed.    Patient is admitted for acute hypoxemic respiratory failure secondary to  COVID-19 infection and left lower lobe pneumonia, concerning for aspiration. PMT has been consulted to assist with goals of care conversation.  Assessment: Principal Problem:   COVID-19 virus infection Active Problems:   Hyperlipidemia LDL goal <100   Hypertension   Type II diabetes mellitus with complication (HCC)   Atrial fibrillation (HCC)   Pneumonia   Acute hypoxemic respiratory failure (HCC)   CAD (coronary  artery disease)   PVD (peripheral vascular disease) (HCC)   Comfort measures only status     Recommendations/Plan: Continue full comfort measures, no adjustments to medications required today Continue DNR/DNI Family continues to work with [place] and coordinate amongst themselves to determine best disposition plan for patient to begin hospice care upon discharge.  TOC assistance is appreciated PMT will continue to follow and support holistically  Symptom Management Continue morphine q6h; continue PRN doses for breakthrough symptoms  Tylenol PRN pain/fever Biotin twice daily Robinul PRN secretions Haldol PRN agitation/delirium Ativan PRN anxiety/seizure/sleep/distress Zofran PRN nausea/vomiting Liquifilm Tears PRN dry eye Lasix daily Senna and dulcolax PRN constipation  Prognosis:  Poor  Discharge Planning: To Be Determined  MDM: high  Lise Pincus Gregary Signs Palliative Medicine Team Team phone # [phone]  Thank you for allowing the Palliative Medicine Team to assist in the care of this patient. Please utilize secure chat with additional questions, if there is no response within 30 minutes please call the above phone number.  Palliative Medicine Team providers are available by phone from 7am to 7pm daily and can be reached through the team cell phone.  Should this patient require assistance outside of these hours, please call the patient's attending physician.  Portions of this note are a verbal dictation therefore any spelling and/or grammatical errors are  due to the "[place] One" system interpretation.

## 2022-06-09 NOTE — Progress Notes (Signed)
Progress Note    Richard Kent   ERD:[id number]  DOB: [birth year]  DOA: 05/29/2022     11 PCP: Richard Lung, MD  Initial CC: Fall at [facility] Course: Richard Kent is a 89 y.o. male with PMH CAD, HTN, HLD, nephrolithiasis, DM II, PAF, dementia who was brought to the hospital for abdominal/right groin pain and and a fall out of bed.  He was found to be hypoxic on room air requiring oxygen. CTA chest was negative for PE and showed bilateral lower lobe atelectasis/scarring and concern for developing infiltrates in the left lower lobe. He was also found to be positive for COVID-19.  He was started on remdesivir and Unasyn on admission. He also underwent workup for dry gangrene involving left toes.   Interval History:  No events overnight.  Family present bedside when seen this morning.  He continues to rest comfortably with no perceived discomfort and not requiring pain medications nor medications for shortness of breath.  Also continues to eat fairly well.  Assessment and Plan:  Comfort measures only status  Acute respiratory failure with hypoxia COVID-19 infection Left lower lobe pneumonia: COVID 19 pneumonia with possibly combination of aspiration pneumonia CAD Chronic bilateral lower extremity edema/lymphedema Gangrenous changes of toes of left foot/peripheral arterial disease Hypertension Leukocytosis Elevated LFTs Diabetes mellitus type 2 with neuropathy Paroxysmal A-fib Dementia Goals of care Physical deconditioning   Plan -As discussed above, family decided to pursue comfort measures on 06/05/2022.  Palliative care following.   - unfortunately does not appear to be imminently dying and prognosis is not <2 weeks, but he does have potential to decline at any time - dispo remains issue; family unable to care for at home and private pay SNF is not feasible/realistic. Will have PT/OT re-evaluate to see if candidate for rehab at SNF for now at least, then further plan  TBD  Old records reviewed in assessment of this patient  Antimicrobials: Azithromycin 05/29/2022 x 1 Rocephin 05/29/2022 x 1 Remdesivir 05/30/2022 >> 06/01/2022 Unasyn 05/30/2022 >> 06/03/2022  DVT prophylaxis:   Eliquis, now discontinued in setting of comfort care  Code Status:   Code Status: DNR  Mobility Assessment (last 72 hours)     Mobility Assessment     Row Name 06/08/22 2036 06/07/22 2000 06/06/22 1957       Does patient have an order for bedrest or is patient medically unstable No - Continue assessment No - Continue assessment No - Continue assessment     What is the highest level of mobility based on the progressive mobility assessment? Level 2 (Chairfast) - Balance while sitting on edge of bed and cannot stand Level 2 (Chairfast) - Balance while sitting on edge of bed and cannot stand Level 2 (Chairfast) - Balance while sitting on edge of bed and cannot stand     Is the above level different from baseline mobility prior to current illness? Yes - Recommend PT order No - Consider discontinuing PT/OT No - Consider discontinuing PT/OT              Barriers to discharge:  Disposition Plan: Unknown, tentatively residential hospice Status is: Inpt  Objective: Blood pressure 130/82, pulse (!) 104, temperature 98.7 F (37.1 C), temperature source Oral, resp. rate 18, height 6' (1.829 m), weight 84.9 kg, SpO2 91 %.  Examination:  Physical Exam Constitutional:      Comments: Pleasantly demented elderly gentleman lying in bed in no obvious distress  HENT:     Head:  Normocephalic and atraumatic.     Mouth/Throat:     Mouth: Mucous membranes are moist.  Eyes:     Extraocular Movements: Extraocular movements intact.  Cardiovascular:     Rate and Rhythm: Normal rate and regular rhythm.  Pulmonary:     Effort: Pulmonary effort is normal.     Breath sounds: Normal breath sounds.  Abdominal:     General: Bowel sounds are normal. There is no distension.     Palpations: Abdomen  is soft.     Tenderness: There is no abdominal tenderness.  Musculoskeletal:     Cervical back: Normal range of motion and neck supple.     Comments: Dry gangrene appreciated in left toes  Skin:    General: Skin is warm and dry.  Neurological:     Comments: Baseline dementia appreciated      Consultants:  Palliative care  Procedures:    Data Reviewed: No results found for this or any previous visit (from the past 24 hour(s)).  I have reviewed pertinent nursing notes, vitals, labs, and images as necessary. I have ordered labwork to follow up on as indicated.  I have reviewed the last notes from staff over past 24 hours. I have discussed patient's care plan and test results with nursing staff, CM/SW, and other staff as appropriate.    LOS: 11 days   Richard Dee, MD [facility] 06/09/2022, 1:46 PM

## 2022-06-10 DIAGNOSIS — U071 COVID-19: Secondary | ICD-10-CM | POA: Diagnosis not present

## 2022-06-10 DIAGNOSIS — J9601 Acute respiratory failure with hypoxia: Secondary | ICD-10-CM | POA: Diagnosis not present

## 2022-06-10 DIAGNOSIS — J189 Pneumonia, unspecified organism: Secondary | ICD-10-CM | POA: Diagnosis not present

## 2022-06-10 DIAGNOSIS — Z515 Encounter for palliative care: Secondary | ICD-10-CM | POA: Diagnosis not present

## 2022-06-10 NOTE — Progress Notes (Signed)
Daily Progress Note   Patient Name: Richard Kent       Date: 06/10/2022 DOB: 08/01/1928  Age: 89 y.o. MRN#: [id number] Attending Physician: Dwyane Dee, MD Primary Care Physician: Denita Lung, MD Admit Date: 05/29/2022  Reason for Consultation/Follow-up: Establishing goals of care  Subjective: Medical records reviewed.  Per MAR, patient has only received 2 doses of liquid morphine in the past 24 hours due to lethargy at certain times.  Patient assessed at the bedside.  He has eaten about half of his lunch, looks comfortable. He has no complaints.  Discussed with OT and RN.  His granddaughter and wife then joined at the bedside and granddaughter left shortly thereafter.  Emotional support and therapeutic listening was provided to patient's wife, who is so stressed from ongoing conversations regarding difficult disposition planning that she "is about to throw up."  She is praying for more answers and clarity regarding the best path forward.  She tells me that she cannot afford long-term care and is unable to take patient home after 12 years of caring for him already.  She did not have any equipment prior to admission, but still does not feel comfortable taking him home even if hospice were to provide [facility] lift and hospital bed.  OT then provided update on his participation with therapy today.  Richard Kent is excited to hear that he tolerated this without difficulty or pain.  She is still not sure about going to SNF for rehab, but will mention this to family later today as they continue to reflect on their decision.  I provided reassurance that if at any point therapies are providing more harm than benefit and/or worsening his symptoms, they could be discontinued.  She is also worried about prolong the dying  process.  Questions and concerns addressed. PMT will continue to support holistically.   Length of Stay: 12  Physical Exam Vitals and nursing note reviewed.  Constitutional:      General: He is not in acute distress.    Appearance: He is ill-appearing.  Pulmonary:     Effort: No respiratory distress.  Skin:    General: Skin is warm and dry.  Neurological:     Mental Status: He is lethargic.     Motor: Weakness present.  Vital Signs: BP 107/62 (BP Location: Right Arm)   Pulse 71   Temp 98 F (36.7 C) (Oral)   Resp 17   Ht 6' (1.829 m)   Wt 84.9 kg   SpO2 91%   BMI 25.38 kg/m  SpO2: SpO2: 91 % O2 Device: O2 Device: Room Air O2 Flow Rate: O2 Flow Rate (L/min): 2 L/min       Palliative Assessment/Data: PPS 30%   Palliative Care Assessment & Plan   Patient Profile: 89 y.o. male  with past medical history of  CAD, hypertension, hyperlipidemia, nephrolithiasis, type 2 diabetes, paroxysmal A-fib on Eliquis, dementia, bilateral lower extremity lymphedema/venous stasis dermatitis admitted on 05/29/2022 with abdominal/right groin pain following an unwitnessed fall out of his bed.    Patient is admitted for acute hypoxemic respiratory failure secondary to COVID-19 infection and left lower lobe pneumonia, concerning for aspiration. PMT has been consulted to assist with goals of care conversation.  Assessment: Principal Problem:   COVID-19 virus infection Active Problems:   Hyperlipidemia LDL goal <100   Hypertension   Type II diabetes mellitus with complication (HCC)   Atrial fibrillation (HCC)   Pneumonia   Acute hypoxemic respiratory failure (HCC)   CAD (coronary artery disease)   PVD (peripheral vascular disease) (HCC)   Comfort measures only status     Recommendations/Plan: Continue full comfort measures, no adjustments to medications required today Continue DNR/DNI Family continues to discuss disposition options, considering SNF with rehab if long-term  care with hospice is not a feasible option.  They cannot take him home PMT will continue to follow and support holistically  Symptom Management Continue morphine q6h; continue PRN doses for breakthrough symptoms  Tylenol PRN pain/fever Biotin twice daily Robinul PRN secretions Haldol PRN agitation/delirium Ativan PRN anxiety/seizure/sleep/distress Zofran PRN nausea/vomiting Liquifilm Tears PRN dry eye Lasix daily Senna and dulcolax PRN constipation  Prognosis:  Poor  Discharge Planning: To Be Determined   MDM: high  Brehanna Deveny Gregary Signs Palliative Medicine Team Team phone # [phone]  Thank you for allowing the Palliative Medicine Team to assist in the care of this patient. Please utilize secure chat with additional questions, if there is no response within 30 minutes please call the above phone number.  Palliative Medicine Team providers are available by phone from 7am to 7pm daily and can be reached through the team cell phone.  Should this patient require assistance outside of these hours, please call the patient's attending physician.  Portions of this note are a verbal dictation therefore any spelling and/or grammatical errors are due to the "[place] [employer]" system interpretation.

## 2022-06-10 NOTE — Evaluation (Signed)
Occupational Therapy Re-Evaluation Patient Details Name: Richard Kent MRN: [id number] DOB: November 30, 1928 Today's Date: 06/10/2022   History of Present Illness Pt is a 89 y/o male who presents to the ED 05/29/2022 with abdominal/right groin pain following an unwitnessed fall out of his bed a day prior to presentation. On presentation, he was hypoxic in the 80s on room air requiring supplemental oxygen. CTA chest was negative for PE but showed developing infiltrate or aspiration in the left lower lobe. PMH significant for CAD, hypertension, hyperlipidemia, nephrolithiasis, type 2 diabetes, paroxysmal A-fib on Eliquis, dementia, bilateral lower extremity lymphedema.   Clinical Impression   Pt was seen on 1/11 and recommendations were SNF for rehab at that time. Per discussion with palliative, pt was put on comfort care 1/15 however family is now apparently considering rehab at [facility]. At baseline, grandson lifts pt into his wc where he spends the majority of his day and family assists with all ADL tasks, with the exception of self feeding. Wife states he was receiving HHOT/PT.  Pt alert and cooperative during session, oriented to self only. Able to progress to EOB with mod A and sit unsupported with minguard assist. Returned to bed and pt assisted with pulling himself up in bed before placing in chair position. Once in chair position, pt fed himself his entire meal after set up. Wife present at end of session and appears overwhelmed regarding her huband's situation. Given his participation today, feel he is able to participate in rehab however will most likely need long term care or DC home with 24/7 assist and DME listed below. Will follow up next week as appropriate.      Recommendations for follow up therapy are one component of a multi-disciplinary discharge planning process, led by the attending physician.  Recommendations may be updated based on patient status, additional functional criteria and insurance  authorization.   Follow Up Recommendations  Skilled nursing-short term rehab (<3 hours/day)     Assistance Recommended at Discharge Frequent or constant Supervision/Assistance  Patient can return home with the following Two people to help with walking and/or transfers;Two people to help with bathing/dressing/bathroom;Assistance with feeding;Assist for transportation    Functional Status Assessment  Patient has had a recent decline in their functional status and/or demonstrates limited ability to make significant improvements in function in a reasonable and predictable amount of time  Equipment Recommendations  Hospital bed;Other (comment) Harrel Lemon)    Recommendations for Other Services       Precautions / Restrictions Precautions Precautions: Fall Precaution Comments: right side hemi baseline with hx of CVA, dementia, HOH Restrictions Weight Bearing Restrictions: No      Mobility Bed Mobility Overal bed mobility: Needs Assistance Bed Mobility: Rolling Rolling: Mod assist, Min assist   Supine to sit: Mod assist Sit to supine: Mod assist        Transfers                   General transfer comment: will need maximove to trnaser to chair      Balance Overall balance assessment: Mild deficits observed, not formally tested Sitting-balance support: Feet supported, No upper extremity supported Sitting balance-Leahy Scale: Fair                                     ADL either performed or assessed with clinical judgement   ADL Overall ADL's : Needs assistance/impaired Eating/Feeding: Set up;Supervision/ safety  Grooming: Minimal assistance                               Functional mobility during ADLs: Moderate assistance (for bed mobility)       Vision   Vision Assessment?: No apparent visual deficits     Perception     Praxis      Pertinent Vitals/Pain Pain Assessment Pain Assessment: Faces Faces Pain Scale: No  hurt Breathing: normal Negative Vocalization: none Facial Expression: smiling or inexpressive Body Language: relaxed Consolability: no need to console PAINAD Score: 0 Pain Location: RLE (with movement) Pain Descriptors / Indicators: Grimacing, Guarding, Sore Pain Intervention(s): Limited activity within patient's tolerance     Hand Dominance Right (initially right handed although now left handed due to CVA)   Extremity/Trunk Assessment Upper Extremity Assessment RUE Deficits / Details: Right side hemi at baseline due to past CVA. Right shoulder and elbow contracture limiting functional use of UE. Able to passively perform shoulder flexion through 25% range. Elbow remains at 90 degrees flexion with very minimal give. Functional passive supination/pronation, wrist flexion/extension. Able to demonstrate functional composite finger extension and flexion with weak gross grasp present. RUE Coordination: decreased gross motor;decreased fine motor LUE Deficits / Details: Demonstrates functional strength needed to complete bed mobility (rolling righ and left using bed rails).using to assist wtih mobility   Lower Extremity Assessment Lower Extremity Assessment: Defer to PT evaluation RLE Deficits / Details: PAD; gangrene toes   Cervical / Trunk Assessment Cervical / Trunk Assessment: Kyphotic;Other exceptions (posterior bias) Cervical / Trunk Exceptions: Forward head posture with rounded shoulders   Communication Communication Communication: HOH   Cognition Arousal/Alertness: Awake/alert Behavior During Therapy: WFL for tasks assessed/performed Overall Cognitive Status: No family/caregiver present to determine baseline cognitive functioning Area of Impairment: Orientation, Attention, Memory, Following commands, Safety/judgement, Awareness, Problem solving                 Orientation Level: Disoriented to, Place, Time, Situation Current Attention Level: Focused, Sustained Memory:  Decreased short-term memory Following Commands: Follows one step commands with increased time Safety/Judgement: Decreased awareness of safety, Decreased awareness of deficits Awareness: Intellectual Problem Solving: Slow processing, Decreased initiation, Requires verbal cues General Comments: Pt with history of dementia - wife states he is at baseline; following commands; oriented to self only     General Comments       Exercises     Shoulder Instructions      Home Living Family/patient expects to be discharged to:: Skilled nursing facility Living Arrangements: Other relatives;Spouse/significant other;Children                                      Prior Functioning/Environment Prior Level of Function : Needs assist  Cognitive Assist : ADLs (cognitive);Mobility (cognitive)     Physical Assist : Mobility (physical);ADLs (physical) Mobility (physical): Bed mobility;Transfers ADLs (physical): Grooming;Bathing;Dressing;Toileting;IADLs Mobility Comments: Pt is nonambulatory completing only transfers from bed to w/c with family assisting. Grandson reports that he would lift him up and get him into the w/c. Unable to bear weight into bilateral feet due to wounds. ADLs Comments: Pt completed bed baths and dressed in bed with Wife assisting. Pt was able to button his shirt.        OT Problem List: Decreased strength;Impaired balance (sitting and/or standing);Decreased activity tolerance;Decreased range of motion;Decreased safety awareness;Decreased knowledge of  use of DME or AE;Decreased coordination;Decreased knowledge of precautions;Cardiopulmonary status limiting activity;Pain      OT Treatment/Interventions: Therapeutic exercise;Self-care/ADL training;DME and/or AE instruction;Therapeutic activities;Patient/family education;Balance training;Cognitive remediation/compensation    OT Goals(Current goals can be found in the care plan section) Acute Rehab OT Goals Patient  Stated Goal: wife wants to trial rehab OT Goal Formulation: With family Time For Goal Achievement: 06/24/22 Potential to Achieve Goals: Fair  OT Frequency: Min 1X/week    Co-evaluation              AM-PAC OT "6 Clicks" Daily Activity     Outcome Measure Help from another person eating meals?: A Little Help from another person taking care of personal grooming?: A Little Help from another person toileting, which includes using toliet, bedpan, or urinal?: Total Help from another person bathing (including washing, rinsing, drying)?: A Lot Help from another person to put on and taking off regular upper body clothing?: Total Help from another person to put on and taking off regular lower body clothing?: Total 6 Click Score: 11   End of Session Nurse Communication: Mobility status;Need for lift equipment  Activity Tolerance: Patient tolerated treatment well Patient left: with call bell/phone within reach;with family/visitor present;in bed;with bed alarm set  OT Visit Diagnosis: Muscle weakness (generalized) (M62.81);Other abnormalities of gait and mobility (R26.89);Pain;Other symptoms and signs involving cognitive function Pain - Right/Left: Right Pain - part of body: Leg                Time: 1325-1355 OT Time Calculation (min): 30 min Charges:  OT General Charges $OT Visit: 1 Visit OT Evaluation $OT Eval Moderate Complexity: 1 Mod OT Treatments $Self Care/Home Management : 8-22 mins  Maurie Boettcher, OT/L   Acute OT Clinical Specialist [place] Pager [phone] Office [phone]   [facility] 06/10/2022, 2:39 PM

## 2022-06-10 NOTE — Progress Notes (Signed)
Progress Note    Richard Kent   OMB:[id number]  DOB: Sep 17, 1928  DOA: 05/29/2022     12 PCP: Denita Lung, MD  Initial CC: Fall at [facility] Course: Mr. Enfield is a 89 y.o. male with PMH CAD, HTN, HLD, nephrolithiasis, DM II, PAF, dementia who was brought to the hospital for abdominal/right groin pain and and a fall out of bed.  He was found to be hypoxic on room air requiring oxygen. CTA chest was negative for PE and showed bilateral lower lobe atelectasis/scarring and concern for developing infiltrates in the left lower lobe. He was also found to be positive for COVID-19.  He was started on remdesivir and Unasyn on admission. He also underwent workup for dry gangrene involving left toes.   Interval History:  No events overnight. Resting comfortably this morning. Talking easily and seemingly had no complaints.   Assessment and Plan:  Comfort measures only status  Acute respiratory failure with hypoxia COVID-19 infection Left lower lobe pneumonia: COVID 19 pneumonia with possibly combination of aspiration pneumonia CAD Chronic bilateral lower extremity edema/lymphedema Gangrenous changes of toes of left foot/peripheral arterial disease Hypertension Leukocytosis Elevated LFTs Diabetes mellitus type 2 with neuropathy Paroxysmal A-fib Dementia Goals of care Physical deconditioning   Plan -As discussed above, family decided to pursue comfort measures on 06/05/2022.  Palliative care following.   - unfortunately does not appear to be imminently dying and prognosis is not <2 weeks, but he does have potential to decline at any time - dispo remains issue; family unable to care for at home and private pay SNF is not feasible/realistic. Will have PT/OT re-evaluate to see if candidate for rehab at SNF for now at least, then further plan TBD  Old records reviewed in assessment of this patient  Antimicrobials: Azithromycin 05/29/2022 x 1 Rocephin 05/29/2022 x 1 Remdesivir  05/30/2022 >> 06/01/2022 Unasyn 05/30/2022 >> 06/03/2022  DVT prophylaxis:   Eliquis, now discontinued in setting of comfort care  Code Status:   Code Status: DNR  Mobility Assessment (last 72 hours)     Mobility Assessment     Row Name 06/10/22 1400 06/10/22 1200 06/09/22 2240 06/08/22 2036 06/07/22 2000   Does patient have an order for bedrest or is patient medically unstable -- No - Continue assessment No - Continue assessment No - Continue assessment No - Continue assessment   What is the highest level of mobility based on the progressive mobility assessment? Level 2 (Chairfast) - Balance while sitting on edge of bed and cannot stand Level 2 (Chairfast) - Balance while sitting on edge of bed and cannot stand Level 2 (Chairfast) - Balance while sitting on edge of bed and cannot stand Level 2 (Chairfast) - Balance while sitting on edge of bed and cannot stand Level 2 (Chairfast) - Balance while sitting on edge of bed and cannot stand   Is the above level different from baseline mobility prior to current illness? -- Yes - Recommend PT order Yes - Recommend PT order Yes - Recommend PT order No - Consider discontinuing PT/OT            Barriers to discharge:  Disposition Plan: Unknown, tentatively residential hospice Status is: Inpt  Objective: Blood pressure 107/62, pulse 71, temperature 98 F (36.7 C), temperature source Oral, resp. rate 17, height 6' (1.829 m), weight 84.9 kg, SpO2 91 %.  Examination:  Physical Exam Constitutional:      Comments: Pleasantly demented elderly gentleman lying in bed in no obvious  distress  HENT:     Head: Normocephalic and atraumatic.     Mouth/Throat:     Mouth: Mucous membranes are moist.  Eyes:     Extraocular Movements: Extraocular movements intact.  Cardiovascular:     Rate and Rhythm: Normal rate and regular rhythm.  Pulmonary:     Effort: Pulmonary effort is normal.     Breath sounds: Normal breath sounds.  Abdominal:     General: Bowel  sounds are normal. There is no distension.     Palpations: Abdomen is soft.     Tenderness: There is no abdominal tenderness.  Musculoskeletal:     Cervical back: Normal range of motion and neck supple.     Comments: Dry gangrene appreciated in left toes  Skin:    General: Skin is warm and dry.  Neurological:     Comments: Baseline dementia appreciated      Consultants:  Palliative care  Procedures:    Data Reviewed: No results found for this or any previous visit (from the past 24 hour(s)).  I have reviewed pertinent nursing notes, vitals, labs, and images as necessary. I have ordered labwork to follow up on as indicated.  I have reviewed the last notes from staff over past 24 hours. I have discussed patient's care plan and test results with nursing staff, CM/SW, and other staff as appropriate.    LOS: 12 days   Dwyane Dee, MD [facility] 06/10/2022, 3:34 PM

## 2022-06-11 DIAGNOSIS — U071 COVID-19: Secondary | ICD-10-CM | POA: Diagnosis not present

## 2022-06-11 DIAGNOSIS — J9601 Acute respiratory failure with hypoxia: Secondary | ICD-10-CM | POA: Diagnosis not present

## 2022-06-11 DIAGNOSIS — Z515 Encounter for palliative care: Secondary | ICD-10-CM | POA: Diagnosis not present

## 2022-06-11 NOTE — Progress Notes (Signed)
Progress Note    Richard Kent   ZHY:[id number]  DOB: [birth year]  DOA: 05/29/2022     13 PCP: Denita Lung, MD  Initial CC: Fall at [facility] Course: Mr. Mceachin is a 89 y.o. male with PMH CAD, HTN, HLD, nephrolithiasis, DM II, PAF, dementia who was brought to the hospital for abdominal/right groin pain and and a fall out of bed.  He was found to be hypoxic on room air requiring oxygen. CTA chest was negative for PE and showed bilateral lower lobe atelectasis/scarring and concern for developing infiltrates in the left lower lobe. He was also found to be positive for COVID-19.  He was started on remdesivir and Unasyn on admission. He also underwent workup for dry gangrene involving left toes.   Interval History:  No events overnight. Resting comfortably this morning. Talking easily and seemingly had no complaints.  Not actively dying at this time but has potential to worsen.  Awaiting dispo planning still.   Assessment and Plan:  Comfort measures only status  Acute respiratory failure with hypoxia COVID-19 infection Left lower lobe pneumonia: COVID 19 pneumonia with possibly combination of aspiration pneumonia CAD Chronic bilateral lower extremity edema/lymphedema Gangrenous changes of toes of left foot/peripheral arterial disease Hypertension Leukocytosis Elevated LFTs Diabetes mellitus type 2 with neuropathy Paroxysmal A-fib Dementia Goals of care Physical deconditioning   Plan -As discussed above, family decided to pursue comfort measures on 06/05/2022.  Palliative care following.   - unfortunately does not appear to be imminently dying and prognosis is not <2 weeks, but he does have potential to decline at any time - dispo remains issue; family unable to care for at home and private pay SNF is not feasible/realistic. Will have PT/OT re-evaluate to see if candidate for rehab at SNF for now at least, then further plan TBD  Old records reviewed in assessment of this  patient  Antimicrobials: Azithromycin 05/29/2022 x 1 Rocephin 05/29/2022 x 1 Remdesivir 05/30/2022 >> 06/01/2022 Unasyn 05/30/2022 >> 06/03/2022  DVT prophylaxis:   Eliquis, now discontinued in setting of comfort care  Code Status:   Code Status: DNR  Mobility Assessment (last 72 hours)     Mobility Assessment     Row Name 06/11/22 0750 06/10/22 2055 06/10/22 1400 06/10/22 1200 06/09/22 2240   Does patient have an order for bedrest or is patient medically unstable No - Continue assessment No - Continue assessment -- No - Continue assessment No - Continue assessment   What is the highest level of mobility based on the progressive mobility assessment? Level 2 (Chairfast) - Balance while sitting on edge of bed and cannot stand Level 2 (Chairfast) - Balance while sitting on edge of bed and cannot stand Level 2 (Chairfast) - Balance while sitting on edge of bed and cannot stand Level 2 (Chairfast) - Balance while sitting on edge of bed and cannot stand Level 2 (Chairfast) - Balance while sitting on edge of bed and cannot stand   Is the above level different from baseline mobility prior to current illness? Yes - Recommend PT order Yes - Recommend PT order -- Yes - Recommend PT order Yes - Recommend PT order    [place] Name 06/08/22 2036           Does patient have an order for bedrest or is patient medically unstable No - Continue assessment       What is the highest level of mobility based on the progressive mobility assessment? Level 2 (Chairfast) - Balance  while sitting on edge of bed and cannot stand       Is the above level different from baseline mobility prior to current illness? Yes - Recommend PT order                Barriers to discharge:  Disposition Plan: Unknown, tentatively residential hospice Status is: Inpt  Objective: Blood pressure (!) 157/76, pulse 62, temperature 98.2 F (36.8 C), temperature source Oral, resp. rate 16, height 6' (1.829 m), weight 84.9 kg, SpO2 94 %.   Examination:  Physical Exam Constitutional:      Comments: Pleasantly demented elderly gentleman lying in bed in no obvious distress  HENT:     Head: Normocephalic and atraumatic.     Mouth/Throat:     Mouth: Mucous membranes are moist.  Eyes:     Extraocular Movements: Extraocular movements intact.  Cardiovascular:     Rate and Rhythm: Normal rate and regular rhythm.  Pulmonary:     Effort: Pulmonary effort is normal.     Breath sounds: Normal breath sounds.  Abdominal:     General: Bowel sounds are normal. There is no distension.     Palpations: Abdomen is soft.     Tenderness: There is no abdominal tenderness.  Musculoskeletal:     Cervical back: Normal range of motion and neck supple.     Comments: Dry gangrene appreciated in left toes  Skin:    General: Skin is warm and dry.  Neurological:     Comments: Baseline dementia appreciated      Consultants:  Palliative care  Procedures:    Data Reviewed: No results found for this or any previous visit (from the past 24 hour(s)).  I have reviewed pertinent nursing notes, vitals, labs, and images as necessary. I have ordered labwork to follow up on as indicated.  I have reviewed the last notes from staff over past 24 hours. I have discussed patient's care plan and test results with nursing staff, CM/SW, and other staff as appropriate.    LOS: 13 days   Dwyane Dee, MD [facility] 06/11/2022, 12:55 PM

## 2022-06-11 NOTE — Progress Notes (Signed)
Daily Progress Note   Patient Name: Richard Kent       Date: 06/11/2022 DOB: [birth year]  Age: 89 y.o. MRN#: [id number] Attending Physician: Dwyane Dee, MD Primary Care Physician: Denita Lung, MD Admit Date: 05/29/2022  Reason for Consultation/Follow-up: Establishing goals of care  Subjective: Medical records reviewed.  Patient assessed at the bedside.  He denies pain or distress.  No family present during my visit.  I then called patient's daughter [name] but was unable to reach. A voicemail was left. Patient's family has been encouraged to call with any additional needs.  PMT will continue to support holistically.   Length of Stay: 13  Physical Exam Vitals and nursing note reviewed.  Constitutional:      General: He is not in acute distress.    Appearance: He is ill-appearing.  Cardiovascular:     Rate and Rhythm: Normal rate.  Pulmonary:     Effort: No respiratory distress.  Skin:    General: Skin is warm and dry.  Neurological:     Mental Status: He is lethargic.     Motor: Weakness present.            Vital Signs: BP (!) 157/76   Pulse 62   Temp 98.2 F (36.8 C) (Oral)   Resp 16   Ht 6' (1.829 m)   Wt 84.9 kg   SpO2 94%   BMI 25.38 kg/m  SpO2: SpO2: 94 % O2 Device: O2 Device: Room Air O2 Flow Rate: O2 Flow Rate (L/min): 2 L/min       Palliative Assessment/Data: PPS 30%   Palliative Care Assessment & Plan   Patient Profile: 89 y.o. male  with past medical history of  CAD, hypertension, hyperlipidemia, nephrolithiasis, type 2 diabetes, paroxysmal A-fib on Eliquis, dementia, bilateral lower extremity lymphedema/venous stasis dermatitis admitted on 05/29/2022 with abdominal/right groin pain following an unwitnessed fall out of his bed.    Patient is  admitted for acute hypoxemic respiratory failure secondary to COVID-19 infection and left lower lobe pneumonia, concerning for aspiration. PMT has been consulted to assist with goals of care conversation.  Assessment: Principal Problem:   COVID-19 virus infection Active Problems:   Hyperlipidemia LDL goal <100   Hypertension   Type II diabetes mellitus with complication (HCC)   Atrial fibrillation (HCC)   Pneumonia  Acute hypoxemic respiratory failure (HCC)   CAD (coronary artery disease)   PVD (peripheral vascular disease) (HCC)   Comfort measures only status     Recommendations/Plan: Continue DNR Continue comfort care Await family decision on disposition PMT will continue to follow and support  Symptom Management Continue morphine q6h; continue PRN doses for breakthrough symptoms  Tylenol PRN pain/fever Biotin twice daily Robinul PRN secretions Haldol PRN agitation/delirium Ativan PRN anxiety/seizure/sleep/distress Zofran PRN nausea/vomiting Liquifilm Tears PRN dry eye Lasix daily Senna and dulcolax PRN constipation  Prognosis:  Poor  Discharge Planning: To Be Determined   Total time: I spent 25 minutes in the care of the patient today in the above activities and documenting the encounter.    Dorthy Cooler, PA-C Palliative Medicine Team Team phone # [phone]  Thank you for allowing the Palliative Medicine Team to assist in the care of this patient. Please utilize secure chat with additional questions, if there is no response within 30 minutes please call the above phone number.  Palliative Medicine Team providers are available by phone from 7am to 7pm daily and can be reached through the team cell phone.  Should this patient require assistance outside of these hours, please call the patient's attending physician.  Portions of this note are a verbal dictation therefore any spelling and/or grammatical errors are due to the "[place] [employer]" system  interpretation.

## 2022-06-12 DIAGNOSIS — J9601 Acute respiratory failure with hypoxia: Secondary | ICD-10-CM | POA: Diagnosis not present

## 2022-06-12 DIAGNOSIS — U071 COVID-19: Secondary | ICD-10-CM | POA: Diagnosis not present

## 2022-06-12 DIAGNOSIS — Z515 Encounter for palliative care: Secondary | ICD-10-CM | POA: Diagnosis not present

## 2022-06-12 NOTE — TOC Progression Note (Signed)
Transition of Care [facility]) - Progression Note    Patient Details  Name: Richard Kent MRN: [id number] Date of Birth: Feb 28, 1929  Transition of Care [facility]) CM/SW [place], [place] Phone Number: 06/12/2022, 4:16 PM  Clinical Narrative:     CSW attempted to call Gemma Payor [phone] ext [phone] though is forwarded to [facility] "transfer center" when typing in extension. Provided pt name, dob, and last 4 of ssn and is informed that pt is not documented in their system as receiving care with [facility].   Expected Discharge Plan: [place] Barriers to Discharge: Continued Medical Work up, SNF Pending bed offer  Expected Discharge Plan and Services     Post Acute Care Choice: [place] Living arrangements for the past 2 months: Single Family Home                                       Social Determinants of Health (SDOH) Interventions SDOH Screenings   Food Insecurity: No Food Insecurity (05/30/2022)  Housing: Low Risk  (05/30/2022)  Transportation Needs: No Transportation Needs (05/30/2022)  Utilities: Not At Risk (05/30/2022)  Depression (PHQ2-9): Low Risk  (09/09/2021)  Financial Resource Strain: Low Risk  (09/09/2021)  Physical Activity: Sufficiently Active (09/09/2021)  Stress: No Stress Concern Present (09/09/2021)  Tobacco Use: Medium Risk (05/31/2022)    Readmission Risk Interventions    09/28/2019    2:22 PM  Readmission Risk Prevention Plan  Post Dischage Appt Complete  Medication Screening Complete  Transportation Screening Complete

## 2022-06-12 NOTE — Progress Notes (Signed)
Progress Note    Richard Kent   TDS:[id number]  DOB: 05/19/1929  DOA: 05/29/2022     14 PCP: Denita Lung, MD  Initial CC: Fall at [facility] Course: Richard Kent is a 89 y.o. male with PMH CAD, HTN, HLD, nephrolithiasis, DM II, PAF, dementia who was brought to the hospital for abdominal/right groin pain and and a fall out of bed.  He was found to be hypoxic on room air requiring oxygen. CTA chest was negative for PE and showed bilateral lower lobe atelectasis/scarring and concern for developing infiltrates in the left lower lobe. He was also found to be positive for COVID-19.  He was started on remdesivir and Unasyn on admission. He also underwent workup for dry gangrene involving left toes.   Interval History:  No events overnight.  Comfortable this morning.  Had eaten breakfast but did not remember doing so when asked but empty meal tray next to him.  Assessment and Plan:  Comfort measures only status  Severe sepsis due to covid pna and bacterial pna Acute respiratory failure with hypoxia COVID-19 infection Left lower lobe pneumonia: COVID 19 pneumonia with possibly combination of aspiration pneumonia CAD Chronic bilateral lower extremity edema/lymphedema Gangrenous changes of toes of left foot/peripheral arterial disease Hypertension Leukocytosis Elevated LFTs Diabetes mellitus type 2 with neuropathy Paroxysmal A-fib Dementia Goals of care Physical deconditioning   Plan -As discussed above, family decided to pursue comfort measures on 06/05/2022.  Palliative care following.   - unfortunately does not appear to be imminently dying and prognosis is not <2 weeks, but he does have potential to decline at any time - dispo remains issue; family unable to care for at home and private pay SNF is not feasible/realistic. Will have PT/OT re-evaluate to see if candidate for rehab at SNF for now at least, then further plan TBD  Old records reviewed in assessment of this  patient  Antimicrobials: Azithromycin 05/29/2022 x 1 Rocephin 05/29/2022 x 1 Remdesivir 05/30/2022 >> 06/01/2022 Unasyn 05/30/2022 >> 06/03/2022  DVT prophylaxis:   Eliquis, now discontinued in setting of comfort care  Code Status:   Code Status: DNR  Mobility Assessment (last 72 hours)     Mobility Assessment     Row Name 06/12/22 0743 06/11/22 2131 06/11/22 0750 06/10/22 2055 06/10/22 1400   Does patient have an order for bedrest or is patient medically unstable No - Continue assessment No - Continue assessment No - Continue assessment No - Continue assessment --   What is the highest level of mobility based on the progressive mobility assessment? Level 2 (Chairfast) - Balance while sitting on edge of bed and cannot stand Level 2 (Chairfast) - Balance while sitting on edge of bed and cannot stand Level 2 (Chairfast) - Balance while sitting on edge of bed and cannot stand Level 2 (Chairfast) - Balance while sitting on edge of bed and cannot stand Level 2 (Chairfast) - Balance while sitting on edge of bed and cannot stand   Is the above level different from baseline mobility prior to current illness? -- Yes - Recommend PT order Yes - Recommend PT order Yes - Recommend PT order --    Row Name 06/10/22 1200 06/09/22 2240         Does patient have an order for bedrest or is patient medically unstable No - Continue assessment No - Continue assessment      What is the highest level of mobility based on the progressive mobility assessment? Level 2 (Chairfast) -  Balance while sitting on edge of bed and cannot stand Level 2 (Chairfast) - Balance while sitting on edge of bed and cannot stand      Is the above level different from baseline mobility prior to current illness? Yes - Recommend PT order Yes - Recommend PT order               Barriers to discharge:  Disposition Plan: Unknown, tentatively residential hospice Status is: Inpt  Objective: Blood pressure 137/79, pulse 74, temperature 97.8  F (36.6 C), temperature source Oral, resp. rate 16, height 6' (1.829 m), weight 84.9 kg, SpO2 96 %.  Examination:  Physical Exam Constitutional:      Comments: Pleasantly demented elderly gentleman lying in bed in no obvious distress  HENT:     Head: Normocephalic and atraumatic.     Mouth/Throat:     Mouth: Mucous membranes are moist.  Eyes:     Extraocular Movements: Extraocular movements intact.  Cardiovascular:     Rate and Rhythm: Normal rate and regular rhythm.  Pulmonary:     Effort: Pulmonary effort is normal.     Breath sounds: Normal breath sounds.  Abdominal:     General: Bowel sounds are normal. There is no distension.     Palpations: Abdomen is soft.     Tenderness: There is no abdominal tenderness.  Musculoskeletal:     Cervical back: Normal range of motion and neck supple.     Comments: Dry gangrene appreciated in left toes  Skin:    General: Skin is warm and dry.  Neurological:     Comments: Baseline dementia appreciated      Consultants:  Palliative care  Procedures:    Data Reviewed: No results found for this or any previous visit (from the past 24 hour(s)).  I have reviewed pertinent nursing notes, vitals, labs, and images as necessary. I have ordered labwork to follow up on as indicated.  I have reviewed the last notes from staff over past 24 hours. I have discussed patient's care plan and test results with nursing staff, CM/SW, and other staff as appropriate.    LOS: 14 days   Dwyane Dee, MD [facility] 06/12/2022, 1:33 PM

## 2022-06-12 NOTE — Progress Notes (Signed)
Symptom check.  Patient in bed appears asleep without distress.  Spoke with RN, she reports patient has not c/o pain and that he is getting hsi scheduled MS as ordered.  He ate just a few bites of breakfast this morning.  RN to calll if symptoms recur or become unmanageable.  Kizzie Fantasia, NS< RN-BC, [facility], [facility] Palliative Clinical Specialist [facility]

## 2022-06-12 NOTE — Progress Notes (Signed)
Physical Therapy Re-Evaluation  Patient Details Name: Richard Kent MRN: [id number] DOB: 03/26/1929 Today's Date: 06/12/2022   History of Present Illness Pt is a 89 y/o male who presents to the ED 05/29/2022 with abdominal/right groin pain following an unwitnessed fall out of his bed a day prior to presentation. On presentation, he was hypoxic in the 80s on room air requiring supplemental oxygen. CTA chest was negative for PE but showed developing infiltrate or aspiration in the left lower lobe. PMH significant for CAD, hypertension, hyperlipidemia, nephrolithiasis, type 2 diabetes, paroxysmal A-fib on Eliquis, dementia, bilateral lower extremity lymphedema.    PT Comments    PT reconsulted after pt taken off comfort care. Pt appears to be functionally unchanged, and continues to be appropriate for SNF level rehab. Pt participated in some therapeutic exercise but main focus of session was OOB to chair with Maximove lift. Will continue to follow at a decreased frequency and pt will remain on mobility specialist caseload for continued OOB.    Recommendations for follow up therapy are one component of a multi-disciplinary discharge planning process, led by the attending physician.  Recommendations may be updated based on patient status, additional functional criteria and insurance authorization.  Follow Up Recommendations  Skilled nursing-short term rehab (<3 hours/day) Can patient physically be transported by private vehicle: No   Assistance Recommended at Discharge Frequent or constant Supervision/Assistance  Patient can return home with the following Two people to help with walking and/or transfers;Two people to help with bathing/dressing/bathroom;Assistance with cooking/housework;Assist for transportation;Help with stairs or ramp for entrance   Equipment Recommendations  Other (comment) (TBD by next venue of care)    Recommendations for Other Services       Precautions / Restrictions  Precautions Precautions: Fall Precaution Comments: right side hemi baseline with hx of CVA, dementia, HOH Restrictions Weight Bearing Restrictions: No     Mobility  Bed Mobility Overal bed mobility: Needs Assistance Bed Mobility: Rolling Rolling: Mod assist, Min assist         General bed mobility comments: Min assist required when roiling R and mod assist required when rolling L.    Transfers Overall transfer level: Needs assistance Equipment used: Ambulation equipment used Transfers: Bed to chair/wheelchair/BSC             General transfer comment: Maximove utilized for OOB to chair. Pt tolerated well. Transfer via Lift Equipment: Maximove  Ambulation/Gait               General Gait Details: Unable - appears to be nonambulatory at baseline.   Stairs             Wheelchair Mobility    Modified Rankin (Stroke Patients Only)       Balance Overall balance assessment: Mild deficits observed, not formally tested Sitting-balance support: Feet supported, No upper extremity supported Sitting balance-Leahy Scale: Fair     Standing balance support: Single extremity supported, Reliant on assistive device for balance Standing balance-Leahy Scale: Zero                              Cognition Arousal/Alertness: Awake/alert Behavior During Therapy: WFL for tasks assessed/performed Overall Cognitive Status: No family/caregiver present to determine baseline cognitive functioning Area of Impairment: Orientation, Attention, Memory, Following commands, Safety/judgement, Awareness, Problem solving                 Orientation Level: Disoriented to, Place, Time, Situation Current Attention Level: Focused, Sustained  Memory: Decreased short-term memory Following Commands: Follows one step commands with increased time Safety/Judgement: Decreased awareness of safety, Decreased awareness of deficits Awareness: Intellectual Problem Solving: Slow  processing, Decreased initiation, Requires verbal cues General Comments: Pt with history of dementia - wife states baseline is following commands; oriented to self only        Exercises General Exercises - Upper Extremity Shoulder Flexion: 10 reps, Left Elbow Flexion: 10 reps, Left General Exercises - Lower Extremity Long Arc Quad: 10 reps, Both    General Comments        Pertinent Vitals/Pain Pain Assessment Pain Assessment: Faces Faces Pain Scale: No hurt    Home Living                          Prior Function            PT Goals (current goals can now be found in the care plan section) Acute Rehab PT Goals Patient Stated Goal: None stated PT Goal Formulation: Patient unable to participate in goal setting Time For Goal Achievement: 06/26/22 Potential to Achieve Goals: Fair Progress towards PT goals: Progressing toward goals    Frequency    Min 1X/week      PT Plan Frequency needs to be updated    Co-evaluation              AM-PAC PT "6 Clicks" Mobility   Outcome Measure  Help needed turning from your back to your side while in a flat bed without using bedrails?: A Lot Help needed moving from lying on your back to sitting on the side of a flat bed without using bedrails?: A Lot Help needed moving to and from a bed to a chair (including a wheelchair)?: Total Help needed standing up from a chair using your arms (e.g., wheelchair or bedside chair)?: Total Help needed to walk in hospital room?: Total Help needed climbing 3-5 steps with a railing? : Total 6 Click Score: 8    End of Session Equipment Utilized During Treatment: Gait belt Activity Tolerance: Patient tolerated treatment well Patient left: in chair;with call bell/phone within reach;with chair alarm set Nurse Communication: Mobility status;Need for lift equipment PT Visit Diagnosis: Muscle weakness (generalized) (M62.81);History of falling (Z91.81);Difficulty in walking, not  elsewhere classified (R26.2)     Time: 2197-5883 PT Time Calculation (min) (ACUTE ONLY): 31 min  Charges:  $Therapeutic Activity: 8-22 mins                     Rolinda Roan, PT, DPT Acute Rehabilitation Services Secure Chat Preferred Office: [phone]    Thelma Comp 06/12/2022, 4:21 PM

## 2022-06-12 NOTE — Plan of Care (Signed)
  Problem: Education: Goal: Knowledge of General Education information will improve Description: Including pain rating scale, medication(s)/side effects and non-pharmacologic comfort measures Outcome: Not Progressing   

## 2022-06-13 DIAGNOSIS — Z515 Encounter for palliative care: Secondary | ICD-10-CM | POA: Diagnosis not present

## 2022-06-13 DIAGNOSIS — U071 COVID-19: Secondary | ICD-10-CM | POA: Diagnosis not present

## 2022-06-13 LAB — GLUCOSE, CAPILLARY: Glucose-Capillary: 158 mg/dL — ABNORMAL HIGH (ref 70–99)

## 2022-06-13 NOTE — Progress Notes (Signed)
Progress Note    Richard Kent   LOV:[id number]  DOB: 1929/02/04  DOA: 05/29/2022     15 PCP: Denita Lung, MD  Initial CC: Fall at [facility] Course: Richard Kent is a 89 y.o. male with PMH CAD, HTN, HLD, nephrolithiasis, DM II, PAF, dementia who was brought to the hospital for abdominal/right groin pain and and a fall out of bed.  He was found to be hypoxic on room air requiring oxygen. CTA chest was negative for PE and showed bilateral lower lobe atelectasis/scarring and concern for developing infiltrates in the left lower lobe. He was also found to be positive for COVID-19.  He was started on remdesivir and Unasyn on admission. He also underwent workup for dry gangrene involving left toes.   Interval History:  No events overnight.  Comfortable this morning.  Eating breakfast when seen this morning.  Was able to work a little bit with PT yesterday.  Possible plan is pursuing SNF if able.  Assessment and Plan:  Comfort measures only status  Severe sepsis due to covid pna and bacterial pna Acute respiratory failure with hypoxia COVID-19 infection Left lower lobe pneumonia: COVID 19 pneumonia with possibly combination of aspiration pneumonia CAD Chronic bilateral lower extremity edema/lymphedema Gangrenous changes of toes of left foot/peripheral arterial disease Hypertension Leukocytosis Elevated LFTs Diabetes mellitus type 2 with neuropathy Paroxysmal A-fib Dementia Goals of care Physical deconditioning   Plan -As discussed above, family decided to pursue comfort measures on 06/05/2022.  Palliative care following.   - unfortunately does not appear to be imminently dying and prognosis is not <2 weeks, but he does have potential to decline at any time - dispo remains issue; family unable to care for at home and private pay SNF/LTC is not feasible/realistic.  -Continue working with PT/OT and we are trying to see if patient eligible for rehab placement  Old records  reviewed in assessment of this patient  Antimicrobials: Azithromycin 05/29/2022 x 1 Rocephin 05/29/2022 x 1 Remdesivir 05/30/2022 >> 06/01/2022 Unasyn 05/30/2022 >> 06/03/2022  DVT prophylaxis:   Eliquis, now discontinued in setting of comfort care  Code Status:   Code Status: DNR  Mobility Assessment (last 72 hours)     Mobility Assessment     Row Name 06/13/22 0900 06/12/22 2100 06/12/22 2000 06/12/22 1614 06/12/22 0743   Does patient have an order for bedrest or is patient medically unstable No - Continue assessment No - Continue assessment No - Continue assessment -- No - Continue assessment   What is the highest level of mobility based on the progressive mobility assessment? Level 2 (Chairfast) - Balance while sitting on edge of bed and cannot stand -- Level 2 (Chairfast) - Balance while sitting on edge of bed and cannot stand Level 2 (Chairfast) - Balance while sitting on edge of bed and cannot stand Level 2 (Chairfast) - Balance while sitting on edge of bed and cannot stand   Is the above level different from baseline mobility prior to current illness? -- -- Yes - Recommend PT order -- --    Row Name 06/11/22 2131 06/11/22 0750 06/10/22 2055 06/10/22 1400     Does patient have an order for bedrest or is patient medically unstable No - Continue assessment No - Continue assessment No - Continue assessment --    What is the highest level of mobility based on the progressive mobility assessment? Level 2 (Chairfast) - Balance while sitting on edge of bed and cannot stand Level 2 (Chairfast) -  Balance while sitting on edge of bed and cannot stand Level 2 (Chairfast) - Balance while sitting on edge of bed and cannot stand Level 2 (Chairfast) - Balance while sitting on edge of bed and cannot stand    Is the above level different from baseline mobility prior to current illness? Yes - Recommend PT order Yes - Recommend PT order Yes - Recommend PT order --             Barriers to discharge:   Disposition Plan: Unknown, tentatively residential hospice Status is: Inpt  Objective: Blood pressure (!) 144/70, pulse 67, temperature 98.3 F (36.8 C), temperature source Oral, resp. rate 16, height 6' (1.829 m), weight 84.9 kg, SpO2 99 %.  Examination:  Physical Exam Constitutional:      Comments: Pleasantly demented elderly gentleman lying in bed in no obvious distress  HENT:     Head: Normocephalic and atraumatic.     Mouth/Throat:     Mouth: Mucous membranes are moist.  Eyes:     Extraocular Movements: Extraocular movements intact.  Cardiovascular:     Rate and Rhythm: Normal rate and regular rhythm.  Pulmonary:     Effort: Pulmonary effort is normal.     Breath sounds: Normal breath sounds.  Abdominal:     General: Bowel sounds are normal. There is no distension.     Palpations: Abdomen is soft.     Tenderness: There is no abdominal tenderness.  Musculoskeletal:     Cervical back: Normal range of motion and neck supple.     Comments: Dry gangrene appreciated in left toes  Skin:    General: Skin is warm and dry.  Neurological:     Comments: Baseline dementia appreciated      Consultants:  Palliative care  Procedures:    Data Reviewed: No results found for this or any previous visit (from the past 24 hour(s)).  I have reviewed pertinent nursing notes, vitals, labs, and images as necessary. I have ordered labwork to follow up on as indicated.  I have reviewed the last notes from staff over past 24 hours. I have discussed patient's care plan and test results with nursing staff, CM/SW, and other staff as appropriate.    LOS: 15 days   Dwyane Dee, MD [facility] 06/13/2022, 12:33 PM

## 2022-06-14 DIAGNOSIS — I4891 Unspecified atrial fibrillation: Secondary | ICD-10-CM

## 2022-06-14 DIAGNOSIS — U071 COVID-19: Secondary | ICD-10-CM | POA: Diagnosis not present

## 2022-06-14 DIAGNOSIS — Z515 Encounter for palliative care: Secondary | ICD-10-CM | POA: Diagnosis not present

## 2022-06-14 DIAGNOSIS — I739 Peripheral vascular disease, unspecified: Secondary | ICD-10-CM | POA: Diagnosis not present

## 2022-06-14 DIAGNOSIS — E785 Hyperlipidemia, unspecified: Secondary | ICD-10-CM

## 2022-06-14 DIAGNOSIS — J9601 Acute respiratory failure with hypoxia: Secondary | ICD-10-CM | POA: Diagnosis not present

## 2022-06-14 NOTE — Progress Notes (Signed)
PROGRESS NOTE    Richard Kent  INO:[id number] DOB: 09/18/1928 DOA: 05/29/2022 PCP: Denita Lung, MD   Brief Narrative:  Mr. Chestnutt is a 89 y.o. male with PMH CAD, HTN, HLD, nephrolithiasis, DM II, PAF, dementia who was brought to the hospital for abdominal/right groin pain and and a fall out of bed.  He was found to be hypoxic on room air requiring oxygen. CTA chest was negative for PE and showed bilateral lower lobe atelectasis/scarring and concern for developing infiltrates in the left lower lobe. He was also found to be positive for COVID-19.  He was started on remdesivir and Unasyn on admission. He also underwent workup for dry gangrene involving left toes.    **Interim History Palliative evaluated and comfort measures were enacted.  He appears comfortable now and currently awaiting disposition for SNf   Assessment and Plan:  Comfort measures only status  Severe sepsis due to covid pna and bacterial pna Acute respiratory failure with hypoxia COVID-19 infection Left lower lobe pneumonia: COVID 19 pneumonia with possibly combination of aspiration pneumonia CAD Chronic bilateral lower extremity edema/lymphedema Gangrenous changes of toes of left foot/peripheral arterial disease Hypertension Leukocytosis Abnormal/Elevated LFTs Hypoalbuminemia Hyponatremia  Diabetes mellitus type 2 with neuropathy Paroxysmal A-fib Dementia Goals of care Physical deconditioning   Plan -Family decided to pursue comfort measures on 06/05/2022.  Palliative care following intermittently    -Does not appear to be imminently dying and prognosis is not <2 weeks, but he does have potential to decline at any time -Dispo remains issue; family unable to care for at home and private pay SNF/LTC is not feasible/realistic.  -Continue working with PT/OT and we are trying to see if patient eligible for rehab placement   Old records reviewed in assessment of this patient  DVT prophylaxis: None (Eliquis  Discontinue in the setting of Comfort Measures)    Code Status: DNR Family Communication: No family present at bedside   Disposition Plan:  Level of care: Med-Surg Status is: Inpatient Remains inpatient appropriate because: Needs assistance with Residential Hospice   Consultants:  Palliative Care Medicine   Procedures:  As above   Antimicrobials:  Anti-infectives (From admission, onward)    Start     Dose/Rate Route Frequency Ordered Stop   05/31/22 1000  remdesivir 100 mg in sodium chloride 0.9 % 100 mL IVPB       See Hyperspace for full Linked Orders Report.   100 mg 200 mL/hr over 30 Minutes Intravenous Daily 05/30/22 0807 06/01/22 1055   05/30/22 0900  remdesivir 200 mg in sodium chloride 0.9% 250 mL IVPB       See Hyperspace for full Linked Orders Report.   200 mg 580 mL/hr over 30 Minutes Intravenous Once 05/30/22 0807 05/30/22 1220   05/30/22 0400  Ampicillin-Sulbactam (UNASYN) 3 g in sodium chloride 0.9 % 100 mL IVPB        3 g 200 mL/hr over 30 Minutes Intravenous Every 6 hours 05/30/22 0333 06/03/22 2311   05/29/22 2230  cefTRIAXone (ROCEPHIN) 1 g in sodium chloride 0.9 % 100 mL IVPB        1 g 200 mL/hr over 30 Minutes Intravenous  Once 05/29/22 2227 05/29/22 2316   05/29/22 2230  azithromycin (ZITHROMAX) 500 mg in sodium chloride 0.9 % 250 mL IVPB        500 mg 250 mL/hr over 60 Minutes Intravenous  Once 05/29/22 2227 05/30/22 0022       Subjective: Seen and examined at bedside  he is resting comfortably with no acute issues.  No nausea or vomiting.  Denies chest pain or shortness of breath.  TOC involved with disposition and the patient's   Objective: Vitals:   06/13/22 0512 06/13/22 1835 06/14/22 0545 06/14/22 1519  BP: (!) 144/70 123/65 138/64 132/87  Pulse: 67 72 66 72  Resp: '16 17 17 16  '$ Temp: 98.3 F (36.8 C) 97.7 F (36.5 C) 97.7 F (36.5 C) (!) 97.4 F (36.3 C)  TempSrc: Oral Oral Oral Oral  SpO2: 99% 94% 96% 95%  Weight:      Height:         Intake/Output Summary (Last 24 hours) at 06/14/2022 1800 Last data filed at 06/14/2022 1700 Gross per 24 hour  Intake 720 ml  Output 1620 ml  Net -900 ml   Filed Weights   06/01/22 0419 06/02/22 0500 06/03/22 0500  Weight: 84.5 kg 84.5 kg 84.9 kg   Examination: Physical Exam:  Constitutional: Thin elderly male who is pleasantly demented in no acute distress Respiratory: Diminished to auscultation bilaterally, no wheezing, rales, rhonchi or crackles. Normal respiratory effort and patient is not tachypenic. No accessory muscle use.  Unlabored breathing Cardiovascular: RRR, no murmurs / rubs / gallops. S1 and S2 auscultated. No extremity edema.  Abdomen: Soft, non-tender, non-distended. Bowel sounds positive.  GU: Deferred. Musculoskeletal: No clubbing / cyanosis of digits/nails. No joint deformity upper and lower extremities. Skin: No rashes, lesions, ulcers on limited skin evaluation. No induration; Warm and dry.  Neurologic: CN 2-12 grossly intact with no focal deficits. Romberg and sign cerebellar reflexes not assessed.  Psychiatric: Normal judgment and insight. Alert and oriented x 3. Normal mood and appropriate affect.   Data Reviewed: I have personally reviewed following labs and imaging studies  CBC: No results for input(s): "WBC", "NEUTROABS", "HGB", "HCT", "MCV", "PLT" in the last 168 hours. Basic Metabolic Panel: No results for input(s): "NA", "K", "CL", "CO2", "GLUCOSE", "BUN", "CREATININE", "CALCIUM", "MG", "PHOS" in the last 168 hours. GFR: Estimated Creatinine Clearance: 45.5 mL/min (by C-G formula based on SCr of 1.16 mg/dL). Liver Function Tests: No results for input(s): "AST", "ALT", "ALKPHOS", "BILITOT", "PROT", "ALBUMIN" in the last 168 hours. No results for input(s): "LIPASE", "AMYLASE" in the last 168 hours. No results for input(s): "AMMONIA" in the last 168 hours. Coagulation Profile: No results for input(s): "INR", "PROTIME" in the last 168  hours. Cardiac Enzymes: No results for input(s): "CKTOTAL", "CKMB", "CKMBINDEX", "TROPONINI" in the last 168 hours. BNP (last 3 results) No results for input(s): "PROBNP" in the last 8760 hours. HbA1C: No results for input(s): "HGBA1C" in the last 72 hours. CBG: Recent Labs  Lab 06/13/22 2159  GLUCAP 158*   Lipid Profile: No results for input(s): "CHOL", "HDL", "LDLCALC", "TRIG", "CHOLHDL", "LDLDIRECT" in the last 72 hours. Thyroid Function Tests: No results for input(s): "TSH", "T4TOTAL", "FREET4", "T3FREE", "THYROIDAB" in the last 72 hours. Anemia Panel: No results for input(s): "VITAMINB12", "FOLATE", "FERRITIN", "TIBC", "IRON", "RETICCTPCT" in the last 72 hours. Sepsis Labs: No results for input(s): "PROCALCITON", "LATICACIDVEN" in the last 168 hours.  No results found for this or any previous visit (from the past 240 hour(s)).   Radiology Studies: No results found.  Scheduled Meds:  antiseptic oral rinse  15 mL Topical BID   furosemide  10 mg Oral Daily   gabapentin  400 mg Oral BID   morphine CONCENTRATE  5 mg Oral Q6H   Continuous Infusions:   LOS: 16 days   Raiford Noble, DO [facility] Available via Epic secure chat 7am-7pm After these hours, please refer to coverage provider listed on [email or web address] 06/14/2022, 6:00 PM

## 2022-06-14 NOTE — Plan of Care (Signed)
  Problem: Education: Goal: Knowledge of General Education information will improve Description: Including pain rating scale, medication(s)/side effects and non-pharmacologic comfort measures Outcome: Progressing   Problem: Health Behavior/Discharge Planning: Goal: Ability to manage health-related needs will improve Outcome: Progressing   Problem: Clinical Measurements: Goal: Ability to maintain clinical measurements within normal limits will improve Outcome: Progressing Goal: Will remain free from infection Outcome: Progressing Goal: Diagnostic test results will improve Outcome: Progressing Goal: Respiratory complications will improve Outcome: Progressing Goal: Cardiovascular complication will be avoided Outcome: Progressing   Problem: Activity: Goal: Risk for activity intolerance will decrease Outcome: Progressing   Problem: Nutrition: Goal: Adequate nutrition will be maintained Outcome: Progressing   Problem: Coping: Goal: Level of anxiety will decrease Outcome: Progressing   Problem: Elimination: Goal: Will not experience complications related to bowel motility Outcome: Progressing Goal: Will not experience complications related to urinary retention Outcome: Progressing   Problem: Pain Managment: Goal: General experience of comfort will improve Outcome: Progressing   Problem: Safety: Goal: Ability to remain free from injury will improve Outcome: Progressing   Problem: Skin Integrity: Goal: Risk for impaired skin integrity will decrease Outcome: Progressing   Problem: Education: Goal: Ability to describe self-care measures that may prevent or decrease complications (Diabetes Survival Skills Education) will improve Outcome: Progressing Goal: Individualized Educational Video(s) Outcome: Progressing   Problem: Coping: Goal: Ability to adjust to condition or change in health will improve Outcome: Progressing   Problem: Fluid Volume: Goal: Ability to  maintain a balanced intake and output will improve Outcome: Progressing   Problem: Health Behavior/Discharge Planning: Goal: Ability to identify and utilize available resources and services will improve Outcome: Progressing Goal: Ability to manage health-related needs will improve Outcome: Progressing   Problem: Metabolic: Goal: Ability to maintain appropriate glucose levels will improve Outcome: Progressing   Problem: Nutritional: Goal: Maintenance of adequate nutrition will improve Outcome: Progressing Goal: Progress toward achieving an optimal weight will improve Outcome: Progressing   Problem: Skin Integrity: Goal: Risk for impaired skin integrity will decrease Outcome: Progressing   Problem: Tissue Perfusion: Goal: Adequacy of tissue perfusion will improve Outcome: Progressing   Problem: Education: Goal: Knowledge of risk factors and measures for prevention of condition will improve Outcome: Progressing   Problem: Coping: Goal: Psychosocial and spiritual needs will be supported Outcome: Progressing   Problem: Respiratory: Goal: Will maintain a patent airway Outcome: Progressing Goal: Complications related to the disease process, condition or treatment will be avoided or minimized Outcome: Progressing   Problem: Education: Goal: Knowledge of the prescribed therapeutic regimen will improve Outcome: Progressing   Problem: Coping: Goal: Ability to identify and develop effective coping behavior will improve Outcome: Progressing   Problem: Clinical Measurements: Goal: Quality of life will improve Outcome: Progressing   Problem: Respiratory: Goal: Verbalizations of increased ease of respirations will increase Outcome: Progressing   Problem: Role Relationship: Goal: Family's ability to cope with current situation will improve Outcome: Progressing Goal: Ability to verbalize concerns, feelings, and thoughts to partner or family member will improve Outcome:  Progressing   Problem: Pain Management: Goal: Satisfaction with pain management regimen will improve Outcome: Progressing

## 2022-06-14 NOTE — TOC Progression Note (Signed)
Transition of Care [facility]) - Progression Note    Patient Details  Name: Richard Kent MRN: [id number] Date of Birth: 1928-11-30  Transition of Care [facility]) CM/SW [place], [place] Phone Number: 06/14/2022, 1:02 PM  Clinical Narrative:    CSW spoke with Lavella Lemons, pt's assigned case manager at the [place]. Per Lavella Lemons, pt has no care providers at the [facility] and they have none of his records. Family would need to register him, before they could see if he is eligible for any benefits. Per previous CSW note, family is working to get pt set up. Family still considering next steps, per palliative. Family considering SNF, pt will need to rescind hospice/ comfort care prior to being Eligible. Pt will not need an auth prior to DC, but insurance can cut his benefits if he is not able to participate meaningfully. CSW continuing to follow palliative. CSW noted family stated they cannot afford to pay for LTC out of pocket. CSW attempted to follow up with [name] to discuss a possible Medicaid application to help with costs. VM left.  TOC will continue to be available for further needs.    Expected Discharge Plan: [place] Barriers to Discharge: Continued Medical Work up, SNF Pending bed offer  Expected Discharge Plan and Services     Post Acute Care Choice: [place] Living arrangements for the past 2 months: Single Family Home                                       Social Determinants of Health (SDOH) Interventions SDOH Screenings   Food Insecurity: No Food Insecurity (05/30/2022)  Housing: Low Risk  (05/30/2022)  Transportation Needs: No Transportation Needs (05/30/2022)  Utilities: Not At Risk (05/30/2022)  Depression (PHQ2-9): Low Risk  (09/09/2021)  Financial Resource Strain: Low Risk  (09/09/2021)  Physical Activity: Sufficiently Active (09/09/2021)  Stress: No Stress Concern Present (09/09/2021)  Tobacco Use: Medium Risk (05/31/2022)    Readmission Risk  Interventions    09/28/2019    2:22 PM  Readmission Risk Prevention Plan  Post Dischage Appt Complete  Medication Screening Complete  Transportation Screening Complete

## 2022-06-15 ENCOUNTER — Telehealth: Payer: Self-pay | Admitting: Family Medicine

## 2022-06-15 DIAGNOSIS — I4891 Unspecified atrial fibrillation: Secondary | ICD-10-CM | POA: Diagnosis not present

## 2022-06-15 DIAGNOSIS — W19XXXA Unspecified fall, initial encounter: Secondary | ICD-10-CM | POA: Diagnosis not present

## 2022-06-15 DIAGNOSIS — J189 Pneumonia, unspecified organism: Secondary | ICD-10-CM | POA: Diagnosis not present

## 2022-06-15 DIAGNOSIS — R0902 Hypoxemia: Secondary | ICD-10-CM | POA: Diagnosis not present

## 2022-06-15 DIAGNOSIS — Z515 Encounter for palliative care: Secondary | ICD-10-CM | POA: Diagnosis not present

## 2022-06-15 DIAGNOSIS — I10 Essential (primary) hypertension: Secondary | ICD-10-CM

## 2022-06-15 DIAGNOSIS — J9601 Acute respiratory failure with hypoxia: Secondary | ICD-10-CM | POA: Diagnosis not present

## 2022-06-15 DIAGNOSIS — U071 COVID-19: Secondary | ICD-10-CM | POA: Diagnosis not present

## 2022-06-15 NOTE — Progress Notes (Signed)
PROGRESS NOTE    Richard Kent  WUJ:[id number] DOB: 1928/11/02 DOA: 05/29/2022 PCP: Denita Lung, MD   Brief Narrative:  Mr. Sciandra is a 89 y.o. male with PMH CAD, HTN, HLD, nephrolithiasis, DM II, PAF, dementia who was brought to the hospital for abdominal/right groin pain and and a fall out of bed.  He was found to be hypoxic on room air requiring oxygen. CTA chest was negative for PE and showed bilateral lower lobe atelectasis/scarring and concern for developing infiltrates in the left lower lobe. He was also found to be positive for COVID-19.  He was started on remdesivir and Unasyn on admission. He also underwent workup for dry gangrene involving left toes.     **Interim History Palliative evaluated and comfort measures were enacted.  He appears comfortable now and currently awaiting disposition for SNF.  Currently family is not interested in resending comfort measures for rehab admission and are currently exploring VA benefits and starting the Medicaid application.  TOC assisting with discharge disposition and family states that SNF is not a good option and they cannot take care of the patient at home.  Currently not a inpatient hospice candidate and family cannot pay for LTC out-of-pocket so case worker has provided information to apply for Medicaid to help with cost of LTC and family will start Medicaid application but would like the patient to be transferred to [facility] once he meets criteria.    Assessment and Plan: Comfort measures only status  Severe sepsis due to covid pna and bacterial pna Acute respiratory failure with hypoxia COVID-19 infection Left lower lobe pneumonia: COVID 19 pneumonia with possibly combination of aspiration pneumonia CAD Chronic bilateral lower extremity edema/lymphedema Gangrenous changes of toes of left foot/peripheral arterial disease Hypertension Leukocytosis Abnormal/Elevated LFTs Hypoalbuminemia Hyponatremia  Diabetes mellitus type 2  with neuropathy Paroxysmal A-fib Dementia Goals of care Physical deconditioning   Plan -Family decided to pursue comfort measures on 06/05/2022.  Palliative care following intermittently    -Does not appear to be imminently dying and prognosis is not <2 weeks, but he does have potential to decline at any time -Dispo remains issue; family unable to care for at home and private pay SNF/LTC is not feasible/realistic.  -Continue working with PT/OT and we are trying to see if patient eligible for rehab placement but after further discussion with palliative the patient is to remain full comfort measures and family is not interested in rescinding comfort measures for rehab admission and are exploring VA benefits of starting the Medicaid application as family states that SNF is not a good option and they cannot take care of the patient at home and family cannot pay for LTC out-of-pocket.  Currently he is not a inpatient hospice candidate but family and noted that they will start the Medicaid application and would like the patient be transferred to [facility] once he meets criteria  DVT prophylaxis: None (Eliquis Discontinue in the setting of Comfort Measures)     Code Status: DNR Family Communication: No family currently at bedside  Disposition Plan:  Level of care: Med-Surg Status is: Inpatient Remains inpatient appropriate because: Is a difficult disposition as above and family would like the patient to go to residential hospice once eligible  Consultants:  Palliative care medicine  Procedures:  As delineated as above  Antimicrobials:  Anti-infectives (From admission, onward)    Start     Dose/Rate Route Frequency Ordered Stop   05/31/22 1000  remdesivir 100 mg in sodium  chloride 0.9 % 100 mL IVPB       See Hyperspace for full Linked Orders Report.   100 mg 200 mL/hr over 30 Minutes Intravenous Daily 05/30/22 0807 06/01/22 1055   05/30/22 0900  remdesivir 200 mg in  sodium chloride 0.9% 250 mL IVPB       See Hyperspace for full Linked Orders Report.   200 mg 580 mL/hr over 30 Minutes Intravenous Once 05/30/22 0807 05/30/22 1220   05/30/22 0400  Ampicillin-Sulbactam (UNASYN) 3 g in sodium chloride 0.9 % 100 mL IVPB        3 g 200 mL/hr over 30 Minutes Intravenous Every 6 hours 05/30/22 0333 06/03/22 2311   05/29/22 2230  cefTRIAXone (ROCEPHIN) 1 g in sodium chloride 0.9 % 100 mL IVPB        1 g 200 mL/hr over 30 Minutes Intravenous  Once 05/29/22 2227 05/29/22 2316   05/29/22 2230  azithromycin (ZITHROMAX) 500 mg in sodium chloride 0.9 % 250 mL IVPB        500 mg 250 mL/hr over 60 Minutes Intravenous  Once 05/29/22 2227 05/30/22 0022       Subjective: Seen and examined at bedside he is resting comfortably and sleeping and briefly woke up but remains sleeping.  No nausea or vomiting and no acute changes overnight.  TOC involved with disposition as it is difficult.  Objective: Vitals:   06/13/22 1835 06/14/22 0545 06/14/22 1519 06/15/22 0524  BP: 123/65 138/64 132/87 123/83  Pulse: 72 66 72 74  Resp: '17 17 16 18  '$ Temp: 97.7 F (36.5 C) 97.7 F (36.5 C) (!) 97.4 F (36.3 C) 97.7 F (36.5 C)  TempSrc: Oral Oral Oral Oral  SpO2: 94% 96% 95% 94%  Weight:      Height:        Intake/Output Summary (Last 24 hours) at 06/15/2022 1621 Last data filed at 06/14/2022 2107 Gross per 24 hour  Intake 240 ml  Output 1220 ml  Net -980 ml   Filed Weights   06/01/22 0419 06/02/22 0500 06/03/22 0500  Weight: 84.5 kg 84.5 kg 84.9 kg   Examination: Physical Exam:  Constitutional: Thin elderly male who is currently demented in no acute distress Respiratory: Diminished to auscultation bilaterally, no wheezing, rales, rhonchi or crackles. Normal respiratory effort and patient is not tachypenic. No accessory muscle use.  Unlabored breathing Cardiovascular: RRR, no murmurs / rubs / gallops. S1 and S2 auscultated. No extremity edema.  Abdomen: Soft,  non-tender, non-distended. Bowel sounds positive.  GU: Deferred. Musculoskeletal: No clubbing / cyanosis of digits/nails. No joint deformity upper and lower extremities.  Skin: No rashes, lesions, ulcers limited skin evaluation. No induration; Warm and dry.  Neurologic: Somnolent and drowsy and did not really wake up for physical examination Psychiatric: Impaired judgment and insight given his somnolence and drowsiness  Data Reviewed: I have personally reviewed following labs and imaging studies  CBC: No results for input(s): "WBC", "NEUTROABS", "HGB", "HCT", "MCV", "PLT" in the last 168 hours. Basic Metabolic Panel: No results for input(s): "NA", "K", "CL", "CO2", "GLUCOSE", "BUN", "CREATININE", "CALCIUM", "MG", "PHOS" in the last 168 hours. GFR: Estimated Creatinine Clearance: 45.5 mL/min (by C-G formula based on SCr of 1.16 mg/dL). Liver Function Tests: No results for input(s): "AST", "ALT", "ALKPHOS", "BILITOT", "PROT", "ALBUMIN" in the last 168 hours. No results for input(s): "LIPASE", "AMYLASE" in the last 168 hours. No results for input(s): "AMMONIA" in the last 168 hours. Coagulation Profile: No results for input(s): "INR", "PROTIME"  in the last 168 hours. Cardiac Enzymes: No results for input(s): "CKTOTAL", "CKMB", "CKMBINDEX", "TROPONINI" in the last 168 hours. BNP (last 3 results) No results for input(s): "PROBNP" in the last 8760 hours. HbA1C: No results for input(s): "HGBA1C" in the last 72 hours. CBG: Recent Labs  Lab 06/13/22 2159  GLUCAP 158*   Lipid Profile: No results for input(s): "CHOL", "HDL", "LDLCALC", "TRIG", "CHOLHDL", "LDLDIRECT" in the last 72 hours. Thyroid Function Tests: No results for input(s): "TSH", "T4TOTAL", "FREET4", "T3FREE", "THYROIDAB" in the last 72 hours. Anemia Panel: No results for input(s): "VITAMINB12", "FOLATE", "FERRITIN", "TIBC", "IRON", "RETICCTPCT" in the last 72 hours. Sepsis Labs: No results for input(s): "PROCALCITON",  "LATICACIDVEN" in the last 168 hours.  No results found for this or any previous visit (from the past 240 hour(s)).   Radiology Studies: No results found.  Scheduled Meds:  antiseptic oral rinse  15 mL Topical BID   furosemide  10 mg Oral Daily   gabapentin  400 mg Oral BID   morphine CONCENTRATE  5 mg Oral Q6H   Continuous Infusions:   LOS: 17 days   Raiford Noble, DO [facility] Available via Epic secure chat 7am-7pm After these hours, please refer to coverage provider listed on [email or web address] 06/15/2022, 4:21 PM

## 2022-06-15 NOTE — Telephone Encounter (Signed)
Richard Kent called and [name] is in the hospital and needs a letter stating he is medically not able to work and is bed bound.

## 2022-06-15 NOTE — Progress Notes (Signed)
Daily Progress Note   Patient Name: Richard Kent       Date: 06/15/2022 DOB: 06-01-1928  Age: 89 y.o. MRN#: [id number] Attending Physician: Kerney Elbe, DO Primary Care Physician: Denita Lung, MD Admit Date: 05/29/2022  Reason for Consultation/Follow-up: Establishing goals of care  Subjective: I have reviewed medical records including EPIC notes and labs. Received report from primary RN - no acute concerns. RN reports patient sleeps most of the day, not verbally responsive.   Went to visit patient at bedside - no family/visitors present. Patient was lying in bed asleep - I did not attempt to wake him to preserve comfort. No signs or non-verbal gestures of pain or discomfort noted. No respiratory distress or increased work of breathing; secretions noted. Requested RN administer robinul. Breakfast tray at bedside - about 40% eaten.  11:21 PM Attempted to call granddaughter/Chelsea for ongoing [place] - no answer - confidential voicemail left and PMT phone number provided with request to return call.  11:22 PM Called daughter/Kelley - emotional support provided. Therapeutic listening provided as she reflects on previous discussions with TOC. Reviewed dispo options that have been explored to include: residential hospice (which patient does not qualify for at this time), LTC with hospice (which family do not have the financial resources to pay for LTC OOP), rehab without hospice and outpatient PC (reviewed comfort measures would have to be rescinded and insurance may not cover if patient is not able to meaningfully participate), or home with hospice. She reiterates that patient "coming home is off the table." Validated this has been clear from previous discussions with [name] and [facility]. Family are  not interested in rescinding comfort measures for rehab - they feel this would cause patient pain and distress. Reviewed as default, patient would remain in house until he declines enough for residential placement or Medicaid application is approved. Inquired about family's efforts on exploring VA benefits and starting Medicaid application. [name] tells me they are still looking into both - they appreciate [facility] assistance with Medicaid application. Family are also gaining information from a [employer]. [name] asks what options would be available if either benefit were approved (VA and/or Medicaid) - reviewed that I could not speak to [place] benefit without knowing additional information; however, did reviewed that if Medicaid was approved, this benefit could assist with LTC  facility costs while Medicare would assist with hospice costs.  [name] expresses appreciation for information provided today. She is hopeful to speak with TOC again today to continue discussions around [facility] application.  All questions and concerns addressed. Encouraged to call with questions and/or concerns. PMT number provided.  Length of Stay: 17  Current Medications: Scheduled Meds:   antiseptic oral rinse  15 mL Topical BID   furosemide  10 mg Oral Daily   gabapentin  400 mg Oral BID   morphine CONCENTRATE  5 mg Oral Q6H    Continuous Infusions:   PRN Meds: acetaminophen **OR** acetaminophen, bisacodyl, glycopyrrolate **OR** glycopyrrolate, haloperidol **OR** haloperidol **OR** haloperidol lactate, hydrocortisone cream, LORazepam **OR** LORazepam, morphine injection, ondansetron **OR** ondansetron (ZOFRAN) IV, polyvinyl alcohol, senna  Physical Exam Vitals and nursing note reviewed.  Constitutional:      General: He is not in acute distress. Pulmonary:     Effort: No respiratory distress.     Comments: secretions Skin:    General: Skin is warm and dry.  Neurological:     Comments: asleep              Vital Signs: BP 123/83 (BP Location: Left Arm)   Pulse 74   Temp 97.7 F (36.5 C) (Oral)   Resp 18   Ht 6' (1.829 m)   Wt 84.9 kg   SpO2 94%   BMI 25.38 kg/m  SpO2: SpO2: 94 % O2 Device: O2 Device: Room Air O2 Flow Rate: O2 Flow Rate (L/min): 2 L/min  Intake/output summary:  Intake/Output Summary (Last 24 hours) at 06/15/2022 1107 Last data filed at 06/14/2022 2107 Gross per 24 hour  Intake 240 ml  Output 1220 ml  Net -980 ml   LBM: Last BM Date : 06/06/22 Baseline Weight: Weight: 82.8 kg Most recent weight: Weight: 84.9 kg       Palliative Assessment/Data: PPS 30%      Patient Active Problem List   Diagnosis Date Noted   Comfort measures only status 06/06/2022   PVD (peripheral vascular disease) ([place]) 06/02/2022   COVID-19 virus infection 05/30/2022   Acute hypoxemic respiratory failure ([place]) 05/30/2022   CAD (coronary artery disease) 05/30/2022   Pneumonia 05/29/2022   Basal cell carcinoma 05/24/2022   Atrial fibrillation ([place]) 05/19/2022   Type II diabetes mellitus with complication ([place]) 85/63/1497   New onset type 2 diabetes mellitus ([place]) 09/29/2021   Aortic atherosclerosis ([place]) 09/19/2021   Diarrhea 09/27/2019   Senile purpura ([place]) 01/09/2017   Carotid stenosis, bilateral 01/14/2012   Hypertension 01/12/2012   Hyperlipidemia LDL goal <100 12/17/2009   Hemiplegia, late effect of cerebrovascular disease ([place]) 12/17/2009   RBBB 10/21/2009    Palliative Care Assessment & Plan   Patient Profile: 89 y.o. male  with past medical history of  CAD, hypertension, hyperlipidemia, nephrolithiasis, type 2 diabetes, paroxysmal A-fib on Eliquis, dementia, bilateral lower extremity lymphedema/venous stasis dermatitis admitted on 05/29/2022 with abdominal/right groin pain following an unwitnessed fall out of his bed.    Patient is admitted for acute hypoxemic respiratory failure secondary to COVID-19 infection and left lower lobe pneumonia, concerning for  aspiration. PMT has been consulted to assist with goals of care conversation.  Assessment: Principal Problem:   COVID-19 virus infection Active Problems:   Hyperlipidemia LDL goal <100   Hypertension   Type II diabetes mellitus with complication (HCC)   Atrial fibrillation (HCC)   Pneumonia   Acute hypoxemic respiratory failure (HCC)   CAD (coronary artery disease)  PVD (peripheral vascular disease) (HCC)   Comfort measures only status   Recommendations/Plan: Continue full comfort measures Continue DNR/DNI as previously documented Family are not interested in rescinding comfort measures for rehab admission Family are exploring VA benefits, starting Medicaid application, and talking with [place] attorney  Continue current comfort focused medication regimen - no changes today PMT will follow peripherally and visit with patient and family incrementally for goals of care discussions as appropriate and based on updates and clinical course. If there are any imminent needs please call the service directly. Family also has PMT contact information should further needs arise.  Symptom Management Continue morphine q6h; PRN doses for breakthrough symptoms  Tylenol PRN pain/fever Biotin twice daily Hydrocortisone cream PRN itching Robinul PRN secretions Haldol PRN agitation/delirium Ativan PRN anxiety/seizure/sleep/distress Zofran PRN nausea/vomiting Liquifilm Tears PRN dry eye Lasix daily Senna and dulcolax PRN constipation   Goals of Care and Additional Recommendations: Limitations on Scope of Treatment: Full Comfort Care  Code Status:    Code Status Orders  (From admission, onward)           Start     Ordered   06/05/22 1232  Do not attempt resuscitation (DNR)  Continuous       Question Answer Comment  If patient has no pulse and is not breathing Do Not Attempt Resuscitation   If patient has a pulse and/or is breathing: Medical Treatment Goals COMFORT MEASURES: Keep  clean/warm/dry, use medication by any route; positioning, wound care and other measures to relieve pain/suffering; use oxygen, suction/manual treatment of airway obstruction for comfort; do not transfer unless for comfort needs.   Consent: Discussion documented in EHR or advanced directives reviewed      06/05/22 1237           Code Status History     Date Active Date Inactive Code Status Order ID Comments User Context   05/30/2022 0320 06/05/2022 1237 DNR [id number]  Shela Leff, MD Inpatient   09/27/2019 0446 09/28/2019 2216 Full Code [id number]  Vianne Bulls, MD Inpatient   01/18/2012 1653 02/09/2012 1532 Full Code [id number]  Mliss Sax, RN Inpatient   01/13/2012 0319 01/18/2012 1653 Full Code [id number]  Pecola Leisure, RN Inpatient      Advance Directive Documentation    Flowsheet Row Most Recent Value  Type of Advance Directive Healthcare Power of Attorney, Living will  Pre-existing out of facility DNR order (yellow form or pink MOST form) --  "MOST" Form in Place? --       Prognosis:  Poor  Discharge Planning: To Be Determined  Care plan was discussed with primary RN, patient's daughter, TOC  Thank you for allowing the Palliative Medicine Team to assist in the care of this patient.   Total Time 55 minutes Prolonged Time Billed  no       Greater than 50%  of this time was spent counseling and coordinating care related to the above assessment and plan.  Lin Landsman, NP  Please contact Palliative Medicine Team phone at [phone] for questions and concerns.   *Portions of this note are a verbal dictation therefore any spelling and/or grammatical errors are due to the "[place]" system interpretation.

## 2022-06-15 NOTE — TOC Progression Note (Signed)
Transition of Care [facility]) - Progression Note    Patient Details  Name: Richard Kent MRN: [id number] Date of Birth: 09-08-1928  Transition of Care [facility]) CM/SW [place], [place] Phone Number: 06/15/2022, 3:25 PM  Clinical Narrative:    CSW met with family at bedside. Discussed barriers and DC options. Family stating SNF is not a good option, and they cannot take care of pt at home. Pt is currently not an IP Hospice candidate and family cannot pay for LTC out of pocket. CSW provided information to apply for Medicaid, to help with costs for LTC. Family noted they will start Medicaid application, but would still like to be transferred to [facility] once pt meets criteria. TOC will continue to follow for DC needs.    Expected Discharge Plan: [place] Barriers to Discharge: Continued Medical Work up, SNF Pending bed offer  Expected Discharge Plan and Services     Post Acute Care Choice: [place] Living arrangements for the past 2 months: Single Family Home                                       Social Determinants of Health (SDOH) Interventions SDOH Screenings   Food Insecurity: No Food Insecurity (05/30/2022)  Housing: Low Risk  (05/30/2022)  Transportation Needs: No Transportation Needs (05/30/2022)  Utilities: Not At Risk (05/30/2022)  Depression (PHQ2-9): Low Risk  (09/09/2021)  Financial Resource Strain: Low Risk  (09/09/2021)  Physical Activity: Sufficiently Active (09/09/2021)  Stress: No Stress Concern Present (09/09/2021)  Tobacco Use: Medium Risk (05/31/2022)    Readmission Risk Interventions    09/28/2019    2:22 PM  Readmission Risk Prevention Plan  Post Dischage Appt Complete  Medication Screening Complete  Transportation Screening Complete

## 2022-06-16 ENCOUNTER — Encounter: Payer: Self-pay | Admitting: Family Medicine

## 2022-06-16 DIAGNOSIS — I4891 Unspecified atrial fibrillation: Secondary | ICD-10-CM | POA: Diagnosis not present

## 2022-06-16 DIAGNOSIS — Z515 Encounter for palliative care: Secondary | ICD-10-CM | POA: Diagnosis not present

## 2022-06-16 DIAGNOSIS — J9601 Acute respiratory failure with hypoxia: Secondary | ICD-10-CM | POA: Diagnosis not present

## 2022-06-16 DIAGNOSIS — U071 COVID-19: Secondary | ICD-10-CM | POA: Diagnosis not present

## 2022-06-16 NOTE — TOC Progression Note (Signed)
Transition of Care [facility]) - Progression Note    Patient Details  Name: Richard Kent MRN: [id number] Date of Birth: Sep 10, 1928  Transition of Care [facility]) CM/SW [place], [place] Phone Number: 06/16/2022, 12:52 PM  Clinical Narrative:    CSW followed up with surrounding facilities to determine if any could review pt for an LOG/ Medicaid pending placement when Medicaid application was established. Pt will also need  to have disability approved to get LTC payments, this will require most facilities to need an LOG.  [facility] facilities- only at [place]. [facility]- no  Central Intake reviewing for [place], [place], [place], [place], and additional facilities. [place]- no [place] reviewing [place]- No [place]- No [place]- No [place]/ [facility] facilities- No [place]/ [place] facilities- no [place]- No [place]- No [place]- No response [place]- No response [place]- No [place]- No [place]- No [place]- No [place]- No [place]- No [place]- No [place]- No  Will continue to follow for DC needs.     Expected Discharge Plan: [place] Barriers to Discharge: Continued Medical Work up, SNF Pending bed offer  Expected Discharge Plan and Services     Post Acute Care Choice: [place] Living arrangements for the past 2 months: Single Family Home                                       Social Determinants of Health (SDOH) Interventions SDOH Screenings   Food Insecurity: No Food Insecurity (05/30/2022)  Housing: Low Risk  (05/30/2022)  Transportation Needs: No Transportation Needs (05/30/2022)  Utilities: Not At Risk (05/30/2022)  Depression (PHQ2-9): Low Risk  (09/09/2021)  Financial Resource Strain: Low Risk  (09/09/2021)  Physical Activity: Sufficiently Active (09/09/2021)   Stress: No Stress Concern Present (09/09/2021)  Tobacco Use: Medium Risk (05/31/2022)    Readmission Risk Interventions    09/28/2019    2:22 PM  Readmission Risk Prevention Plan  Post Dischage Appt Complete  Medication Screening Complete  Transportation Screening Complete

## 2022-06-16 NOTE — Progress Notes (Signed)
OT Cancellation Note/discharge  Patient Details Name: Richard Kent MRN: [id number] DOB: [birth year]   Cancelled Treatment:    Reason Eval/Treat Not Completed: Other (comment) (Per Palliative Medicinem Family is not interested in rescinding comfort measures for rehab admission. Mobility specialists can assist with use of maximove to chair. No acute OT needs at this time. OT signing off.)  [facility] 06/16/2022, 7:48 AM Maurie Boettcher, OT/L   Acute OT Clinical Specialist Acute Rehabilitation Services Pager [phone] Office [phone]

## 2022-06-16 NOTE — Progress Notes (Signed)
PROGRESS NOTE    Richard Kent  RKY:[id number] DOB: [birth year] DOA: 05/29/2022 PCP: Denita Lung, MD   Brief Narrative:  Mr. Loughry is a 89 y.o. male with PMH CAD, HTN, HLD, nephrolithiasis, DM II, PAF, dementia who was brought to the hospital for abdominal/right groin pain and and a fall out of bed.  He was found to be hypoxic on room air requiring oxygen. CTA chest was negative for PE and showed bilateral lower lobe atelectasis/scarring and concern for developing infiltrates in the left lower lobe. He was also found to be positive for COVID-19.  He was started on remdesivir and Unasyn on admission. He also underwent workup for dry gangrene involving left toes.     **Interim History Palliative evaluated and comfort measures were enacted.  He appears comfortable now and currently awaiting disposition for SNF.  Currently family is not interested in resending comfort measures for rehab admission and are currently exploring VA benefits and starting the Medicaid application.  TOC assisting with discharge disposition and family states that SNF is not a good option and they cannot take care of the patient at home.  Currently not a inpatient hospice candidate and family cannot pay for LTC out-of-pocket so case worker has provided information to apply for Medicaid to help with cost of LTC and family will start Medicaid application but would like the patient to be transferred to [facility] once he meets criteria.  **06/16/22: See following up in the [facility] for laser treatment of any could review the patient for allergy/Medicaid pending placement when Medicaid application has been established and patient also need disability approved to get LTC payments and this will require most facilities are needed allergy and please see the TOC note but the list of facilities.  Assessment and Plan:  Comfort measures only status  Severe sepsis due to covid pna and bacterial pna Acute respiratory failure with  hypoxia COVID-19 infection Left lower lobe pneumonia: COVID 19 pneumonia with possibly combination of aspiration pneumonia CAD Chronic bilateral lower extremity edema/lymphedema Gangrenous changes of toes of left foot/peripheral arterial disease Hypertension Leukocytosis Abnormal/Elevated LFTs Hypoalbuminemia Hyponatremia  Diabetes mellitus type 2 with neuropathy Paroxysmal A-fib Dementia Goals of care Physical deconditioning   Plan -Family decided to pursue comfort measures on 06/05/2022.  Palliative care following intermittently    -Does not appear to be imminently dying and prognosis is not <2 weeks, but he does have potential to decline at any time -Dispo remains issue; family unable to care for at home and private pay SNF/LTC is not feasible/realistic.  -Continue working with PT/OT and we are trying to see if patient eligible for rehab placement but after further discussion with palliative the patient is to remain full comfort measures and family is not interested in rescinding comfort measures for rehab admission and are exploring VA benefits of starting the Medicaid application as family states that SNF is not a good option and they cannot take care of the patient at home and family cannot pay for LTC out-of-pocket.  Currently he is not a inpatient hospice candidate but family and noted that they will start the Medicaid application and would like the patient be transferred to [facility] once he meets criteria   DVT prophylaxis: None (Eliquis Discontinue in the setting of Comfort Measures)      Code Status: DNR Family Communication: No family currently at bedside  Disposition Plan:  Level of care: Med-Surg Status is: Inpatient Remains inpatient appropriate because:  Is a difficult  disposition as above and family would like the patient to go to residential hospice once eligible    Consultants:  Palliative Care Medicine  Procedures:  As delineated as  above  Antimicrobials:  Anti-infectives (From admission, onward)    Start     Dose/Rate Route Frequency Ordered Stop   05/31/22 1000  remdesivir 100 mg in sodium chloride 0.9 % 100 mL IVPB       See Hyperspace for full Linked Orders Report.   100 mg 200 mL/hr over 30 Minutes Intravenous Daily 05/30/22 0807 06/01/22 1055   05/30/22 0900  remdesivir 200 mg in sodium chloride 0.9% 250 mL IVPB       See Hyperspace for full Linked Orders Report.   200 mg 580 mL/hr over 30 Minutes Intravenous Once 05/30/22 0807 05/30/22 1220   05/30/22 0400  Ampicillin-Sulbactam (UNASYN) 3 g in sodium chloride 0.9 % 100 mL IVPB        3 g 200 mL/hr over 30 Minutes Intravenous Every 6 hours 05/30/22 0333 06/03/22 2311   05/29/22 2230  cefTRIAXone (ROCEPHIN) 1 g in sodium chloride 0.9 % 100 mL IVPB        1 g 200 mL/hr over 30 Minutes Intravenous  Once 05/29/22 2227 05/29/22 2316   05/29/22 2230  azithromycin (ZITHROMAX) 500 mg in sodium chloride 0.9 % 250 mL IVPB        500 mg 250 mL/hr over 60 Minutes Intravenous  Once 05/29/22 2227 05/30/22 0022       Subjective: Seen and examined at bedside he was resting but then awoken from his sleep and he had no complaints.  He is comfortable.  Did have some wheezing and some slight rhonchi but not having any shortness of breath.  No other acute changes overnight and TOC remains actively involved with disposition planning.  Objective: Vitals:   06/14/22 0545 06/14/22 1519 06/15/22 0524 06/15/22 2049  BP: 138/64 132/87 123/83 116/72  Pulse: 66 72 74 79  Resp: '17 16 18 18  '$ Temp: 97.7 F (36.5 C) (!) 97.4 F (36.3 C) 97.7 F (36.5 C) 97.7 F (36.5 C)  TempSrc: Oral Oral Oral Oral  SpO2: 96% 95% 94% 97%  Weight:      Height:        Intake/Output Summary (Last 24 hours) at 06/16/2022 0914 Last data filed at 06/15/2022 1649 Gross per 24 hour  Intake --  Output 150 ml  Net -150 ml   Filed Weights   06/01/22 0419 06/02/22 0500 06/03/22 0500  Weight: 84.5  kg 84.5 kg 84.9 kg   Examination: Physical Exam:  Constitutional: Thin elderly chronically ill-appearing Caucasian male.  No acute distress Respiratory: Diminished to auscultation bilaterally, no wheezing, rales, rhonchi or crackles. Normal respiratory effort and patient is not tachypenic. No accessory muscle use.  Unlabored breathing Cardiovascular: RRR, no murmurs / rubs / gallops. S1 and S2 auscultated. No extremity edema. Abdomen: Soft, non-tender, non-distended. Bowel sounds positive.  GU: Deferred. Musculoskeletal: No clubbing / cyanosis of digits/nails. No joint deformity upper and lower extremities. Skin: No rashes, lesions, ulcers. No induration; Warm and dry.  Neurologic: CN 2-12 grossly intact with no focal deficits. Romberg sign and cerebellar reflexes not assessed.  Psychiatric: Impaired judgment and insight and was little somnolent but woke up and appears calm  Data Reviewed: I have personally reviewed following labs and imaging studies  CBC: No results for input(s): "WBC", "NEUTROABS", "HGB", "HCT", "MCV", "PLT" in the last 168 hours. Basic Metabolic Panel: No results  for input(s): "NA", "K", "CL", "CO2", "GLUCOSE", "BUN", "CREATININE", "CALCIUM", "MG", "PHOS" in the last 168 hours. GFR: Estimated Creatinine Clearance: 45.5 mL/min (by C-G formula based on SCr of 1.16 mg/dL). Liver Function Tests: No results for input(s): "AST", "ALT", "ALKPHOS", "BILITOT", "PROT", "ALBUMIN" in the last 168 hours. No results for input(s): "LIPASE", "AMYLASE" in the last 168 hours. No results for input(s): "AMMONIA" in the last 168 hours. Coagulation Profile: No results for input(s): "INR", "PROTIME" in the last 168 hours. Cardiac Enzymes: No results for input(s): "CKTOTAL", "CKMB", "CKMBINDEX", "TROPONINI" in the last 168 hours. BNP (last 3 results) No results for input(s): "PROBNP" in the last 8760 hours. HbA1C: No results for input(s): "HGBA1C" in the last 72 hours. CBG: Recent  Labs  Lab 06/13/22 2159  GLUCAP 158*   Lipid Profile: No results for input(s): "CHOL", "HDL", "LDLCALC", "TRIG", "CHOLHDL", "LDLDIRECT" in the last 72 hours. Thyroid Function Tests: No results for input(s): "TSH", "T4TOTAL", "FREET4", "T3FREE", "THYROIDAB" in the last 72 hours. Anemia Panel: No results for input(s): "VITAMINB12", "FOLATE", "FERRITIN", "TIBC", "IRON", "RETICCTPCT" in the last 72 hours. Sepsis Labs: No results for input(s): "PROCALCITON", "LATICACIDVEN" in the last 168 hours.  No results found for this or any previous visit (from the past 240 hour(s)).   Radiology Studies: No results found.  Scheduled Meds:  antiseptic oral rinse  15 mL Topical BID   furosemide  10 mg Oral Daily   gabapentin  400 mg Oral BID   morphine CONCENTRATE  5 mg Oral Q6H   Continuous Infusions:   LOS: 18 days   Raiford Noble, DO [facility] Available via Epic secure chat 7am-7pm After these hours, please refer to coverage provider listed on [email or web address] 06/16/2022, 9:14 AM

## 2022-06-16 NOTE — Plan of Care (Signed)
  Problem: Health Behavior/Discharge Planning: Goal: Ability to manage health-related needs will improve Outcome: Progressing   Problem: Clinical Measurements: Goal: Ability to maintain clinical measurements within normal limits will improve Outcome: Progressing   Problem: Clinical Measurements: Goal: Will remain free from infection Outcome: Progressing   Problem: Clinical Measurements: Goal: Will remain free from infection Outcome: Progressing

## 2022-06-16 NOTE — Telephone Encounter (Signed)
Talked to [facility] again and she is needing the letter for his VA benefits, she states she is not sure why they want the letter. She also wanted me to let you know he is currently bed bound and she feels that he will pass away soon.

## 2022-06-16 NOTE — Telephone Encounter (Signed)
Letter ready to print

## 2022-06-17 DIAGNOSIS — J9601 Acute respiratory failure with hypoxia: Secondary | ICD-10-CM | POA: Diagnosis not present

## 2022-06-17 DIAGNOSIS — I4891 Unspecified atrial fibrillation: Secondary | ICD-10-CM | POA: Diagnosis not present

## 2022-06-17 DIAGNOSIS — U071 COVID-19: Secondary | ICD-10-CM | POA: Diagnosis not present

## 2022-06-17 DIAGNOSIS — E785 Hyperlipidemia, unspecified: Secondary | ICD-10-CM | POA: Diagnosis not present

## 2022-06-17 NOTE — Progress Notes (Signed)
PROGRESS NOTE    Richard Kent  SAY:[id number] DOB: [birth year] DOA: 05/29/2022 PCP: Denita Lung, MD   Brief Narrative:  Richard Kent is a 89 y.o. male with PMH CAD, HTN, HLD, nephrolithiasis, DM II, PAF, dementia who was brought to the hospital for abdominal/right groin pain and and a fall out of bed.  He was found to be hypoxic on room air requiring oxygen. CTA chest was negative for PE and showed bilateral lower lobe atelectasis/scarring and concern for developing infiltrates in the left lower lobe. He was also found to be positive for COVID-19.  He was started on remdesivir and Unasyn on admission. He also underwent workup for dry gangrene involving left toes.     **Interim History Palliative evaluated and comfort measures were enacted.  He appears comfortable now and currently awaiting disposition for SNF.  Currently family is not interested in resending comfort measures for rehab admission and are currently exploring VA benefits and starting the Medicaid application.  TOC assisting with discharge disposition and family states that SNF is not a good option and they cannot take care of the patient at home.  Currently not a inpatient hospice candidate and family cannot pay for LTC out-of-pocket so case worker has provided information to apply for Medicaid to help with cost of LTC and family will start Medicaid application but would like the patient to be transferred to [facility] once he meets criteria.   **06/16/22: CSW following up with the Surrounding Facilities to determine any could review for LOG/Medicaid pending placement when Medicaid application has been established and patient also need disability approved to get LTC payments and this will require most facilities are need an LOG and please see the TOC note  of the list of facilities.  As of now he still remains medically stable for discharge once disposition is determined.  Assessment and Plan:  Comfort measures only status   Severe sepsis due to covid pna and bacterial pna Acute respiratory failure with hypoxia COVID-19 infection Left lower lobe pneumonia: COVID 19 pneumonia with possibly combination of aspiration pneumonia CAD Chronic bilateral lower extremity edema/lymphedema Gangrenous changes of toes of left foot/peripheral arterial disease Hypertension Leukocytosis Abnormal/Elevated LFTs Hypoalbuminemia Hyponatremia  Diabetes mellitus type 2 with neuropathy Paroxysmal A-fib Dementia Goals of care Physical deconditioning   Plan -Family decided to pursue comfort measures on 06/05/2022.  Palliative care following intermittently    -Does not appear to be imminently dying and prognosis is not <2 weeks, but he does have potential to decline at any time -Dispo remains issue; family unable to care for at home and private pay SNF/LTC is not feasible/realistic.  -Continue working with PT/OT and we are trying to see if patient eligible for rehab placement but after further discussion with palliative the patient is to remain full comfort measures and family is not interested in rescinding comfort measures for rehab admission and are exploring VA benefits of starting the Medicaid application as family states that SNF is not a good option and they cannot take care of the patient at home and family cannot pay for LTC out-of-pocket.  Currently he is not a inpatient hospice candidate but family and noted that they will start the Medicaid application and would like the patient be transferred to [facility] once he meets criteria   DVT prophylaxis: None (Eliquis Discontinue in the setting of Comfort Measures)      Code Status: DNR Family Communication:  Is a difficult disposition as above and family  would like the patient to go to residential hospice once eligible    Disposition Plan:  Level of care: Med-Surg Status is: Inpatient Remains inpatient appropriate because: TOC cannot find a disposition  for this patient given that he has a difficult decision as above and patient's family would like to go to residential hospice once he is eligible   Consultants:  Palliative Care Medicine  Procedures:  As delineated as above  Antimicrobials:  Anti-infectives (From admission, onward)    Start     Dose/Rate Route Frequency Ordered Stop   05/31/22 1000  remdesivir 100 mg in sodium chloride 0.9 % 100 mL IVPB       See Hyperspace for full Linked Orders Report.   100 mg 200 mL/hr over 30 Minutes Intravenous Daily 05/30/22 0807 06/01/22 1055   05/30/22 0900  remdesivir 200 mg in sodium chloride 0.9% 250 mL IVPB       See Hyperspace for full Linked Orders Report.   200 mg 580 mL/hr over 30 Minutes Intravenous Once 05/30/22 0807 05/30/22 1220   05/30/22 0400  Ampicillin-Sulbactam (UNASYN) 3 g in sodium chloride 0.9 % 100 mL IVPB        3 g 200 mL/hr over 30 Minutes Intravenous Every 6 hours 05/30/22 0333 06/03/22 2311   05/29/22 2230  cefTRIAXone (ROCEPHIN) 1 g in sodium chloride 0.9 % 100 mL IVPB        1 g 200 mL/hr over 30 Minutes Intravenous  Once 05/29/22 2227 05/29/22 2316   05/29/22 2230  azithromycin (ZITHROMAX) 500 mg in sodium chloride 0.9 % 250 mL IVPB        500 mg 250 mL/hr over 60 Minutes Intravenous  Once 05/29/22 2227 05/30/22 0022       Subjective: Seen and examined at bedside he is resting and awoken from his sleep and had no complaints.  Felt well.  States that he did not know when he had a last bowel movement.  No nausea or vomiting.  Appears calm and comfortable with no active issues.   Objective: Vitals:   06/15/22 0524 06/15/22 2049 06/16/22 2158 06/17/22 0826  BP: 123/83 116/72 129/82 (!) 155/68  Pulse: 74 79 (!) 59 80  Resp: '18 18 17 18  '$ Temp: 97.7 F (36.5 C) 97.7 F (36.5 C) 98 F (36.7 C) 97.7 F (36.5 C)  TempSrc: Oral Oral Oral Oral  SpO2: 94% 97% 98% 95%  Weight:      Height:        Intake/Output Summary (Last 24 hours) at 06/17/2022 0914 Last  data filed at 06/16/2022 2254 Gross per 24 hour  Intake 720 ml  Output 600 ml  Net 120 ml   Filed Weights   06/01/22 0419 06/02/22 0500 06/03/22 0500  Weight: 84.5 kg 84.5 kg 84.9 kg   Examination: Physical Exam:  Constitutional: Thin elderly chronically ill-appearing Caucasian male in no acute distress Respiratory: Diminished at to auscultation bilaterally with some slight rhonchi and wheezing but no appreciable rales or crackles. Normal respiratory effort and patient is not tachypenic. No accessory muscle use.  Unlabored breathing Cardiovascular: RRR, no murmurs / rubs / gallops. S1 and S2 auscultated. No extremity edema. 2+ pedal pulses. No carotid bruits.  Abdomen: Soft, non-tender, non-distended. No masses palpated. No appreciable hepatosplenomegaly. Bowel sounds positive.  GU: Deferred. Musculoskeletal: No clubbing / cyanosis of digits/nails. No joint deformity upper and lower extremities.  Skin: No rashes, lesions, ulcers. No induration; Warm and dry.  Neurologic: CN 2-12 grossly intact  with no focal deficits. Romberg sign and cerebellar reflexes not assessed.  He is somnolent Psychiatric: Appears calm and has a normal mood and affect  Data Reviewed: I have personally reviewed following labs and imaging studies  CBC: No results for input(s): "WBC", "NEUTROABS", "HGB", "HCT", "MCV", "PLT" in the last 168 hours. Basic Metabolic Panel: No results for input(s): "NA", "K", "CL", "CO2", "GLUCOSE", "BUN", "CREATININE", "CALCIUM", "MG", "PHOS" in the last 168 hours. GFR: Estimated Creatinine Clearance: 45.5 mL/min (by C-G formula based on SCr of 1.16 mg/dL). Liver Function Tests: No results for input(s): "AST", "ALT", "ALKPHOS", "BILITOT", "PROT", "ALBUMIN" in the last 168 hours. No results for input(s): "LIPASE", "AMYLASE" in the last 168 hours. No results for input(s): "AMMONIA" in the last 168 hours. Coagulation Profile: No results for input(s): "INR", "PROTIME" in the last 168  hours. Cardiac Enzymes: No results for input(s): "CKTOTAL", "CKMB", "CKMBINDEX", "TROPONINI" in the last 168 hours. BNP (last 3 results) No results for input(s): "PROBNP" in the last 8760 hours. HbA1C: No results for input(s): "HGBA1C" in the last 72 hours. CBG: Recent Labs  Lab 06/13/22 2159  GLUCAP 158*   Lipid Profile: No results for input(s): "CHOL", "HDL", "LDLCALC", "TRIG", "CHOLHDL", "LDLDIRECT" in the last 72 hours. Thyroid Function Tests: No results for input(s): "TSH", "T4TOTAL", "FREET4", "T3FREE", "THYROIDAB" in the last 72 hours. Anemia Panel: No results for input(s): "VITAMINB12", "FOLATE", "FERRITIN", "TIBC", "IRON", "RETICCTPCT" in the last 72 hours. Sepsis Labs: No results for input(s): "PROCALCITON", "LATICACIDVEN" in the last 168 hours.  No results found for this or any previous visit (from the past 240 hour(s)).   Radiology Studies: No results found.   Scheduled Meds:  antiseptic oral rinse  15 mL Topical BID   furosemide  10 mg Oral Daily   gabapentin  400 mg Oral BID   morphine CONCENTRATE  5 mg Oral Q6H   Continuous Infusions:   LOS: 19 days   Raiford Noble, DO [facility] Available via Epic secure chat 7am-7pm After these hours, please refer to coverage provider listed on [email or web address] 06/17/2022, 9:14 AM

## 2022-06-17 NOTE — Progress Notes (Signed)
   Medical records reviewed. Patient has remained comfortable on Morphine '5mg'$  PO Q6H, last required IV morphine for breakthrough pain on 1/25.    Goals of care remain clear for comfort-focused care and a peaceful, natural death when it is patient's time. Appreciate TOC assistance in supporting family through navigating an appropriate disposition option for hospice care.   No further palliative needs identified at this time.  Thank you for your referral and allowing PMT to assist in Mr. Richard Kent's care.   Dorthy Cooler, [facility] Palliative Medicine Team  Team Phone # [phone]   NO CHARGE

## 2022-06-18 DIAGNOSIS — I4891 Unspecified atrial fibrillation: Secondary | ICD-10-CM | POA: Diagnosis not present

## 2022-06-18 DIAGNOSIS — E785 Hyperlipidemia, unspecified: Secondary | ICD-10-CM | POA: Diagnosis not present

## 2022-06-18 DIAGNOSIS — J9601 Acute respiratory failure with hypoxia: Secondary | ICD-10-CM | POA: Diagnosis not present

## 2022-06-18 DIAGNOSIS — U071 COVID-19: Secondary | ICD-10-CM | POA: Diagnosis not present

## 2022-06-18 NOTE — Progress Notes (Signed)
PROGRESS NOTE    Richard Kent  NIO:[id number] DOB: 09/13/1928 DOA: 05/29/2022 PCP: Denita Lung, MD   Brief Narrative:  Mr. Minjares is a 89 y.o. male with PMH CAD, HTN, HLD, nephrolithiasis, DM II, PAF, dementia who was brought to the hospital for abdominal/right groin pain and and a fall out of bed.  He was found to be hypoxic on room air requiring oxygen. CTA chest was negative for PE and showed bilateral lower lobe atelectasis/scarring and concern for developing infiltrates in the left lower lobe. He was also found to be positive for COVID-19.  He was started on remdesivir and Unasyn on admission. He also underwent workup for dry gangrene involving left toes.     **Interim History Palliative evaluated and comfort measures were enacted.  He appears comfortable now and currently awaiting disposition for SNF.  Currently family is not interested in resending comfort measures for rehab admission and are currently exploring VA benefits and starting the Medicaid application.  TOC assisting with discharge disposition and family states that SNF is not a good option and they cannot take care of the patient at home.  Currently not a inpatient hospice candidate and family cannot pay for LTC out-of-pocket so case worker has provided information to apply for Medicaid to help with cost of LTC and family will start Medicaid application but would like the patient to be transferred to [facility] once he meets criteria.   **06/16/22: CSW following up with the Surrounding Facilities to determine any could review for LOG/Medicaid pending placement when Medicaid application has been established and patient also need disability approved to get LTC payments and this will require most facilities are need an LOG and please see the TOC note  of the list of facilities.   As of now he still remains medically stable for discharge once disposition is determined.   Assessment and Plan:  Comfort measures only status   Severe sepsis due to covid pna and bacterial pna Acute respiratory failure with hypoxia COVID-19 infection Left lower lobe pneumonia: COVID 19 pneumonia with possibly combination of aspiration pneumonia CAD Chronic bilateral lower extremity edema/lymphedema Gangrenous changes of toes of left foot/peripheral arterial disease Hypertension Leukocytosis Abnormal/Elevated LFTs Hypoalbuminemia Hyponatremia  Diabetes mellitus type 2 with neuropathy Paroxysmal A-fib Dementia Goals of care Physical deconditioning   Plan -Family decided to pursue comfort measures on 06/05/2022.  Palliative care following intermittently    -Does not appear to be imminently dying and prognosis is not <2 weeks, but he does have potential to decline at any time -Dispo remains issue; family unable to care for at home and private pay SNF/LTC is not feasible/realistic.  -Continue working with PT/OT and we are trying to see if patient eligible for rehab placement but after further discussion with palliative the patient is to remain full comfort measures and family is not interested in rescinding comfort measures for rehab admission and are exploring VA benefits of starting the Medicaid application as family states that SNF is not a good option and they cannot take care of the patient at home and family cannot pay for LTC out-of-pocket.  Currently he is not a inpatient hospice candidate but family and noted that they will start the Medicaid application and would like the patient be transferred to [facility] once he meets criteria.  Patient remains on on 5 mg p.o. every 6 morphine last required IV morphine for breakthrough pain on 06/15/2022 but appears comfortable today.  After discussion with palliative they  will continue to follow intermittently and the goals remain clear for comfort focused care and peaceful natural death wonders the patient is time for the palliative discussion.    DVT prophylaxis:  None (Eliquis Discontinue in the setting of Comfort Measures)       Code Status: DNR Family Communication:   Disposition Plan:  Level of care: Med-Surg Status is: Inpatient Remains inpatient appropriate because:  TOC cannot find a disposition for this patient given that he has a difficult decision as above and patient's family would like to go to residential hospice once he is eligible    Consultants:  Palliative Care Medicine  Procedures:  As delineated as above   Antimicrobials:  Anti-infectives (From admission, onward)    Start     Dose/Rate Route Frequency Ordered Stop   05/31/22 1000  remdesivir 100 mg in sodium chloride 0.9 % 100 mL IVPB       See Hyperspace for full Linked Orders Report.   100 mg 200 mL/hr over 30 Minutes Intravenous Daily 05/30/22 0807 06/01/22 1055   05/30/22 0900  remdesivir 200 mg in sodium chloride 0.9% 250 mL IVPB       See Hyperspace for full Linked Orders Report.   200 mg 580 mL/hr over 30 Minutes Intravenous Once 05/30/22 0807 05/30/22 1220   05/30/22 0400  Ampicillin-Sulbactam (UNASYN) 3 g in sodium chloride 0.9 % 100 mL IVPB        3 g 200 mL/hr over 30 Minutes Intravenous Every 6 hours 05/30/22 0333 06/03/22 2311   05/29/22 2230  cefTRIAXone (ROCEPHIN) 1 g in sodium chloride 0.9 % 100 mL IVPB        1 g 200 mL/hr over 30 Minutes Intravenous  Once 05/29/22 2227 05/29/22 2316   05/29/22 2230  azithromycin (ZITHROMAX) 500 mg in sodium chloride 0.9 % 250 mL IVPB        500 mg 250 mL/hr over 60 Minutes Intravenous  Once 05/29/22 2227 05/30/22 0022       Subjective: Seen and examined at bedside he was resting briefly awoke from his sleep and had no complaints.  No acute changes overnight.  Still awaiting TOC assistance with discharge disposition.  Objective: Vitals:   06/16/22 2158 06/17/22 0826 06/17/22 1538 06/18/22 0210  BP: 129/82 (!) 155/68 109/71 112/70  Pulse: (!) 59 80 72 68  Resp: '17 18 17 18  '$ Temp: 98 F (36.7 C) 97.7 F (36.5  C) 97.9 F (36.6 C) 98 F (36.7 C)  TempSrc: Oral Oral Oral Oral  SpO2: 98% 95% 98% 98%  Weight:      Height:        Intake/Output Summary (Last 24 hours) at 06/18/2022 0831 Last data filed at 06/17/2022 1200 Gross per 24 hour  Intake 220 ml  Output 300 ml  Net -80 ml   Filed Weights   06/01/22 0419 06/02/22 0500 06/03/22 0500  Weight: 84.5 kg 84.5 kg 84.9 kg   Examination: Physical Exam:  Constitutional: Thin elderly chronically ill-appearing Caucasian male currently no acute distress and he is resting sleeping Respiratory: Diminished to auscultation bilaterally with some coarse breath sounds and some slight rhonchi but no appreciable wheezing, rales, crackles.  Has a normal respiratory effort and he is not tachypneic or using any accessory muscles to breathe Cardiovascular: RRR, no murmurs / rubs / gallops. S1 and S2 auscultated. No extremity edema.  Abdomen: Soft, non-tender, non-distended.  Bowel sounds positive.  GU: Deferred. Musculoskeletal: No clubbing / cyanosis of digits/nails. No  joint deformity in the upper and lower extremities.  Skin: No rashes, lesions, ulcers on limited skin evaluation. No induration; Warm and dry.  Neurologic: He is somnolent and drowsy and briefly awoke to physical stimuli and had no acute distress. Psychiatric: Somnolent and drowsy and appears calm.  Data Reviewed: I have personally reviewed following labs and imaging studies  CBC: No results for input(s): "WBC", "NEUTROABS", "HGB", "HCT", "MCV", "PLT" in the last 168 hours. Basic Metabolic Panel: No results for input(s): "NA", "K", "CL", "CO2", "GLUCOSE", "BUN", "CREATININE", "CALCIUM", "MG", "PHOS" in the last 168 hours. GFR: Estimated Creatinine Clearance: 45.5 mL/min (by C-G formula based on SCr of 1.16 mg/dL). Liver Function Tests: No results for input(s): "AST", "ALT", "ALKPHOS", "BILITOT", "PROT", "ALBUMIN" in the last 168 hours. No results for input(s): "LIPASE", "AMYLASE" in the  last 168 hours. No results for input(s): "AMMONIA" in the last 168 hours. Coagulation Profile: No results for input(s): "INR", "PROTIME" in the last 168 hours. Cardiac Enzymes: No results for input(s): "CKTOTAL", "CKMB", "CKMBINDEX", "TROPONINI" in the last 168 hours. BNP (last 3 results) No results for input(s): "PROBNP" in the last 8760 hours. HbA1C: No results for input(s): "HGBA1C" in the last 72 hours. CBG: Recent Labs  Lab 06/13/22 2159  GLUCAP 158*   Lipid Profile: No results for input(s): "CHOL", "HDL", "LDLCALC", "TRIG", "CHOLHDL", "LDLDIRECT" in the last 72 hours. Thyroid Function Tests: No results for input(s): "TSH", "T4TOTAL", "FREET4", "T3FREE", "THYROIDAB" in the last 72 hours. Anemia Panel: No results for input(s): "VITAMINB12", "FOLATE", "FERRITIN", "TIBC", "IRON", "RETICCTPCT" in the last 72 hours. Sepsis Labs: No results for input(s): "PROCALCITON", "LATICACIDVEN" in the last 168 hours.  No results found for this or any previous visit (from the past 240 hour(s)).   Radiology Studies: No results found.  Scheduled Meds:  antiseptic oral rinse  15 mL Topical BID   furosemide  10 mg Oral Daily   gabapentin  400 mg Oral BID   morphine CONCENTRATE  5 mg Oral Q6H   Continuous Infusions:   LOS: 20 days   Raiford Noble, DO [facility] Available via Epic secure chat 7am-7pm After these hours, please refer to coverage provider listed on [email or web address] 06/18/2022, 8:31 AM

## 2022-06-19 DIAGNOSIS — J9601 Acute respiratory failure with hypoxia: Secondary | ICD-10-CM | POA: Diagnosis not present

## 2022-06-19 DIAGNOSIS — U071 COVID-19: Secondary | ICD-10-CM | POA: Diagnosis not present

## 2022-06-19 DIAGNOSIS — E785 Hyperlipidemia, unspecified: Secondary | ICD-10-CM | POA: Diagnosis not present

## 2022-06-19 DIAGNOSIS — I4891 Unspecified atrial fibrillation: Secondary | ICD-10-CM | POA: Diagnosis not present

## 2022-06-19 NOTE — Progress Notes (Signed)
PROGRESS NOTE    Richard Kent  YSA:[id number] DOB: 06/29/1928 DOA: 05/29/2022 PCP: Denita Lung, MD   Brief Narrative:  Mr. Murguia is a 89 y.o. male with PMH CAD, HTN, HLD, nephrolithiasis, DM II, PAF, dementia who was brought to the hospital for abdominal/right groin pain and and a fall out of bed.  He was found to be hypoxic on room air requiring oxygen. CTA chest was negative for PE and showed bilateral lower lobe atelectasis/scarring and concern for developing infiltrates in the left lower lobe. He was also found to be positive for COVID-19.  He was started on remdesivir and Unasyn on admission. He also underwent workup for dry gangrene involving left toes.     **Interim History Palliative evaluated and comfort measures were enacted.  He appears comfortable now and currently awaiting disposition for SNF.  Currently family is not interested in resending comfort measures for rehab admission and are currently exploring VA benefits and starting the Medicaid application.  TOC assisting with discharge disposition and family states that SNF is not a good option and they cannot take care of the patient at home.  Currently not a inpatient hospice candidate and family cannot pay for LTC out-of-pocket so case worker has provided information to apply for Medicaid to help with cost of LTC and family will start Medicaid application but would like the patient to be transferred to [facility] once he meets criteria.   **06/16/22: CSW following up with the Surrounding Facilities to determine any could review for LOG/Medicaid pending placement when Medicaid application has been established and patient also need disability approved to get LTC payments and this will require most facilities are need an LOG and please see the TOC note  of the list of facilities.   As of now he still remains medically stable for discharge once disposition is determined.  Assessment and Plan: Comfort measures only status   Severe sepsis due to covid pna and bacterial pna Acute respiratory failure with hypoxia COVID-19 infection Left lower lobe pneumonia: COVID 19 pneumonia with possibly combination of aspiration pneumonia CAD Chronic bilateral lower extremity edema/lymphedema Gangrenous changes of toes of left foot/peripheral arterial disease Hypertension Leukocytosis Abnormal/Elevated LFTs Hypoalbuminemia Hyponatremia  Diabetes mellitus type 2 with neuropathy Paroxysmal A-fib Dementia Goals of care Physical deconditioning   Plan -Family decided to pursue comfort measures on 06/05/2022.  Palliative care following intermittently    -Does not appear to be imminently dying and prognosis is not <2 weeks, but he does have potential to decline at any time -Dispo remains issue; family unable to care for at home and private pay SNF/LTC is not feasible/realistic.  -Continue working with PT/OT and we are trying to see if patient eligible for rehab placement but after further discussion with palliative the patient is to remain full comfort measures and family is not interested in rescinding comfort measures for rehab admission and are exploring VA benefits of starting the Medicaid application as family states that SNF is not a good option and they cannot take care of the patient at home and family cannot pay for LTC out-of-pocket.  Currently he is not a inpatient hospice candidate but family and noted that they will start the Medicaid application and would like the patient be transferred to [facility] once he meets criteria.   Patient remains on on 5 mg p.o. every 6 morphine last required IV morphine for breakthrough pain on 06/15/2022 but appears comfortable today.  After discussion with palliative they will  continue to follow intermittently and the goals remain clear for comfort focused care and peaceful natural death wonders the patient is time for the palliative discussion.   DVT prophylaxis:  None (Eliquis Discontinue in the setting of Comfort Measures)        Code Status: DNR Family Communication: No family present at bedside   Disposition Plan:  Level of care: Med-Surg Status is: Inpatient Remains inpatient appropriate because: TOC cannot find a disposition for this patient given that he has a difficult decision as above and patient's family would like to go to residential hospice once he is eligible     Consultants:  Palliative Care Medicine   Procedures:  As delineated as above   Antimicrobials:  Anti-infectives (From admission, onward)    Start     Dose/Rate Route Frequency Ordered Stop   05/31/22 1000  remdesivir 100 mg in sodium chloride 0.9 % 100 mL IVPB       See Hyperspace for full Linked Orders Report.   100 mg 200 mL/hr over 30 Minutes Intravenous Daily 05/30/22 0807 06/01/22 1055   05/30/22 0900  remdesivir 200 mg in sodium chloride 0.9% 250 mL IVPB       See Hyperspace for full Linked Orders Report.   200 mg 580 mL/hr over 30 Minutes Intravenous Once 05/30/22 0807 05/30/22 1220   05/30/22 0400  Ampicillin-Sulbactam (UNASYN) 3 g in sodium chloride 0.9 % 100 mL IVPB        3 g 200 mL/hr over 30 Minutes Intravenous Every 6 hours 05/30/22 0333 06/03/22 2311   05/29/22 2230  cefTRIAXone (ROCEPHIN) 1 g in sodium chloride 0.9 % 100 mL IVPB        1 g 200 mL/hr over 30 Minutes Intravenous  Once 05/29/22 2227 05/29/22 2316   05/29/22 2230  azithromycin (ZITHROMAX) 500 mg in sodium chloride 0.9 % 250 mL IVPB        500 mg 250 mL/hr over 60 Minutes Intravenous  Once 05/29/22 2227 05/30/22 0022       Subjective: Seen and examined at bedside and he is resting and had no complaints.  States that his buttock is hurting.  No nausea or vomiting.  Denies any lightheadedness or dizziness.  No other concerns or complaints at this time.  Objective: Vitals:   06/17/22 1538 06/18/22 0210 06/19/22 0359 06/19/22 0943  BP: 109/71 112/70 (!) 158/84 103/61  Pulse: 72 68  91 84  Resp: '17 18 19 17  '$ Temp: 97.9 F (36.6 C) 98 F (36.7 C) (!) 97.4 F (36.3 C) 97.7 F (36.5 C)  TempSrc: Oral Oral Oral Oral  SpO2: 98% 98% 98% (!) 84%  Weight:      Height:        Intake/Output Summary (Last 24 hours) at 06/19/2022 1501 Last data filed at 06/18/2022 1818 Gross per 24 hour  Intake 360 ml  Output 400 ml  Net -40 ml   Filed Weights   06/01/22 0419 06/02/22 0500 06/03/22 0500  Weight: 84.5 kg 84.5 kg 84.9 kg   Examination: Physical Exam:  Constitutional: Thin elderly chronically ill-appearing Caucasian male in no acute distress Respiratory: Diminished to auscultation bilaterally with some coarse breath sounds and some slight rhonchi, no wheezing, rales, or crackles. Normal respiratory effort and patient is not tachypenic. No accessory muscle use.  Unlabored breathing Cardiovascular: RRR, no murmurs / rubs / gallops. No extremity edema. 2+ pedal pulses. No carotid bruits.  Abdomen: Soft, non-tender, non-distended. Bowel sounds positive.  GU: Deferred.  Musculoskeletal: No clubbing / cyanosis of digits/nails. No joint deformity in the upper and lower extremities. Skin: No rashes, lesions, ulcers limited skin evaluation. No induration; Warm and dry.  Neurologic: CN 2-12 grossly intact with no focal deficits. Romberg sign and cerebellar reflexes not assessed.  Psychiatric: Normal judgment and insight.  He is resting and awake and alert  Data Reviewed: I have personally reviewed following labs and imaging studies  CBC: No results for input(s): "WBC", "NEUTROABS", "HGB", "HCT", "MCV", "PLT" in the last 168 hours. Basic Metabolic Panel: No results for input(s): "NA", "K", "CL", "CO2", "GLUCOSE", "BUN", "CREATININE", "CALCIUM", "MG", "PHOS" in the last 168 hours. GFR: Estimated Creatinine Clearance: 45.5 mL/min (by C-G formula based on SCr of 1.16 mg/dL). Liver Function Tests: No results for input(s): "AST", "ALT", "ALKPHOS", "BILITOT", "PROT", "ALBUMIN" in the  last 168 hours. No results for input(s): "LIPASE", "AMYLASE" in the last 168 hours. No results for input(s): "AMMONIA" in the last 168 hours. Coagulation Profile: No results for input(s): "INR", "PROTIME" in the last 168 hours. Cardiac Enzymes: No results for input(s): "CKTOTAL", "CKMB", "CKMBINDEX", "TROPONINI" in the last 168 hours. BNP (last 3 results) No results for input(s): "PROBNP" in the last 8760 hours. HbA1C: No results for input(s): "HGBA1C" in the last 72 hours. CBG: Recent Labs  Lab 06/13/22 2159  GLUCAP 158*   Lipid Profile: No results for input(s): "CHOL", "HDL", "LDLCALC", "TRIG", "CHOLHDL", "LDLDIRECT" in the last 72 hours. Thyroid Function Tests: No results for input(s): "TSH", "T4TOTAL", "FREET4", "T3FREE", "THYROIDAB" in the last 72 hours. Anemia Panel: No results for input(s): "VITAMINB12", "FOLATE", "FERRITIN", "TIBC", "IRON", "RETICCTPCT" in the last 72 hours. Sepsis Labs: No results for input(s): "PROCALCITON", "LATICACIDVEN" in the last 168 hours.  No results found for this or any previous visit (from the past 240 hour(s)).   Radiology Studies: No results found.  Scheduled Meds:  antiseptic oral rinse  15 mL Topical BID   furosemide  10 mg Oral Daily   gabapentin  400 mg Oral BID   morphine CONCENTRATE  5 mg Oral Q6H   Continuous Infusions:   LOS: 21 days   Raiford Noble, DO [facility] Available via Epic secure chat 7am-7pm After these hours, please refer to coverage provider listed on [email or web address] 06/19/2022, 3:01 PM

## 2022-06-19 NOTE — Progress Notes (Signed)
Physical Therapy Treatment and Discharge  Patient Details Name: Richard Kent MRN: [id number] DOB: [birth year] Today's Date: 06/19/2022   History of Present Illness Pt is a 89 y/o male who presents to the ED 05/29/2022 with abdominal/right groin pain following an unwitnessed fall out of his bed a day prior to presentation. On presentation, he was hypoxic in the 80s on room air requiring supplemental oxygen. CTA chest was negative for PE but showed developing infiltrate or aspiration in the left lower lobe. PMH significant for CAD, hypertension, hyperlipidemia, nephrolithiasis, type 2 diabetes, paroxysmal A-fib on Eliquis, dementia, bilateral lower extremity lymphedema.    PT Comments    Family asking for assist in repositioning pt, and when asked, pt eager to sit OOB in chair. Pt presented this afternoon with pain at buttocks/perianal area due to maceration and skin irritation (possibly yeast related). Skin appeared very red and peeling in some areas. Antifungal powder "crusted" over area after skin was cleansed.   Noted pt is now comfort care and family does not desire continued rehab at d/c. Focus of session today was peri-care, transition to the chair via [place], positioning, and transition of care to mobility specialist team. Mobility specialist present during session to assist. Family educated on role of PT vs mobility team and they are agreeable to mobility team follow up after PT signs off. If needs change, please reconsult.     Recommendations for follow up therapy are one component of a multi-disciplinary discharge planning process, led by the attending physician.  Recommendations may be updated based on patient status, additional functional criteria and insurance authorization.  Follow Up Recommendations  Long-term institutional care without follow-up therapy Can patient physically be transported by private vehicle: No   Assistance Recommended at Discharge Frequent or constant  Supervision/Assistance  Patient can return home with the following Two people to help with walking and/or transfers;Two people to help with bathing/dressing/bathroom;Assistance with cooking/housework;Assist for transportation;Help with stairs or ramp for entrance;Direct supervision/assist for medications management;Direct supervision/assist for financial management   Equipment Recommendations  Other (comment) (TBD by next venue of care)    Recommendations for Other Services       Precautions / Restrictions Precautions Precautions: Fall Precaution Comments: right side hemi baseline with hx of CVA, dementia, HOH Restrictions Weight Bearing Restrictions: No     Mobility  Bed Mobility Overal bed mobility: Needs Assistance Bed Mobility: Rolling Rolling: Mod assist, Max assist         General bed mobility comments: Mod assist required when rolling R and max assist required when rolling L.    Transfers Overall transfer level: Needs assistance Equipment used: Ambulation equipment used Transfers: Bed to chair/wheelchair/BSC Sit to Stand: Total assist           General transfer comment: [place] utilized for OOB to chair. Pt tolerated well without complaints of pain. Transfer via Lift Equipment: Maximove  Ambulation/Gait               General Gait Details: Unable - appears to be nonambulatory at baseline.   Stairs             Wheelchair Mobility    Modified Rankin (Stroke Patients Only)       Balance Overall balance assessment: Mild deficits observed, not formally tested Sitting-balance support: Feet supported, No upper extremity supported Sitting balance-Leahy Scale: Fair     Standing balance support: Single extremity supported, Reliant on assistive device for balance Standing balance-Leahy Scale: Zero  Cognition Arousal/Alertness: Lethargic, Suspect due to medications Behavior During Therapy: WFL for tasks  assessed/performed Overall Cognitive Status: History of cognitive impairments - at baseline Area of Impairment: Orientation, Attention, Memory, Following commands, Safety/judgement, Awareness, Problem solving                 Orientation Level: Disoriented to, Place, Time, Situation Current Attention Level: Focused Memory: Decreased short-term memory Following Commands: Follows one step commands with increased time Safety/Judgement: Decreased awareness of safety, Decreased awareness of deficits Awareness: Intellectual Problem Solving: Slow processing, Decreased initiation, Requires verbal cues General Comments: Pt with history of dementia - wife states baseline is following commands; oriented to self only        Exercises      General Comments        Pertinent Vitals/Pain Pain Assessment Pain Assessment: Faces Faces Pain Scale: Hurts little more Pain Location: perianal region and buttocks Pain Descriptors / Indicators: Grimacing, Guarding, Sore, Tender Pain Intervention(s): Limited activity within patient's tolerance, Monitored during session, Repositioned    Home Living Family/patient expects to be discharged to:: Skilled nursing facility Living Arrangements: Other relatives;Spouse/significant other;Children                      Prior Function            PT Goals (current goals can now be found in the care plan section) Acute Rehab PT Goals Patient Stated Goal: None stated PT Goal Formulation: Patient unable to participate in goal setting Time For Goal Achievement: 06/26/22 Potential to Achieve Goals: Fair Progress towards PT goals: Not progressing toward goals - comment (Pt has transitioned to comfort care, PT signing off)    Frequency    Min 1X/week      PT Plan Discharge plan needs to be updated    Co-evaluation              AM-PAC PT "6 Clicks" Mobility   Outcome Measure  Help needed turning from your back to your side while in a  flat bed without using bedrails?: A Lot Help needed moving from lying on your back to sitting on the side of a flat bed without using bedrails?: Total Help needed moving to and from a bed to a chair (including a wheelchair)?: Total Help needed standing up from a chair using your arms (e.g., wheelchair or bedside chair)?: Total Help needed to walk in hospital room?: Total Help needed climbing 3-5 steps with a railing? : Total 6 Click Score: 7    End of Session Equipment Utilized During Treatment: Gait belt Activity Tolerance: Patient tolerated treatment well Patient left: in chair;with call bell/phone within reach;with chair alarm set Nurse Communication: Mobility status;Need for lift equipment PT Visit Diagnosis: Muscle weakness (generalized) (M62.81);History of falling (Z91.81);Difficulty in walking, not elsewhere classified (R26.2)     Time: 1405-1500 PT Time Calculation (min) (ACUTE ONLY): 55 min  Charges:  $Therapeutic Activity: 38-52 mins $Self Care/Home Management: 8-22                     Rolinda Roan, PT, DPT Acute Rehabilitation Services Secure Chat Preferred Office: [phone]    Thelma Comp 06/19/2022, 3:20 PM

## 2022-06-20 ENCOUNTER — Ambulatory Visit: Payer: Medicare Other | Admitting: Orthopedic Surgery

## 2022-06-20 DIAGNOSIS — E785 Hyperlipidemia, unspecified: Secondary | ICD-10-CM | POA: Diagnosis not present

## 2022-06-20 DIAGNOSIS — I4891 Unspecified atrial fibrillation: Secondary | ICD-10-CM | POA: Diagnosis not present

## 2022-06-20 DIAGNOSIS — J9601 Acute respiratory failure with hypoxia: Secondary | ICD-10-CM | POA: Diagnosis not present

## 2022-06-20 DIAGNOSIS — U071 COVID-19: Secondary | ICD-10-CM | POA: Diagnosis not present

## 2022-06-20 NOTE — TOC Progression Note (Addendum)
Transition of Care [facility]) - Progression Note    Patient Details  Name: Richard Kent MRN: [id number] Date of Birth: 1928/06/13  Transition of Care [facility]) CM/SW [place], [place] Phone Number: 06/20/2022, 10:59 AM  Clinical Narrative:     CSW attempted to contact pt's daughter, [name], again, as she had been designated as contact. VM left. [employer] confirmed they can take pt with an LOG. CSW needing to get an update from family on [place] application process. TOC leadership continuing to follow as well for placement needs. TOC continuing to follow.   CSW spoke with spouse at bedside. She states [name] is handling the Medicaid paperwork and that she is out of town. CSW to follow up with [name] again.   Expected Discharge Plan: [place] Barriers to Discharge: Continued Medical Work up, SNF Pending bed offer  Expected Discharge Plan and Services     Post Acute Care Choice: [place] Living arrangements for the past 2 months: Single Family Home                                       Social Determinants of Health (SDOH) Interventions SDOH Screenings   Food Insecurity: No Food Insecurity (05/30/2022)  Housing: Low Risk  (05/30/2022)  Transportation Needs: No Transportation Needs (05/30/2022)  Utilities: Not At Risk (05/30/2022)  Depression (PHQ2-9): Low Risk  (09/09/2021)  Financial Resource Strain: Low Risk  (09/09/2021)  Physical Activity: Sufficiently Active (09/09/2021)  Stress: No Stress Concern Present (09/09/2021)  Tobacco Use: Medium Risk (05/31/2022)    Readmission Risk Interventions    09/28/2019    2:22 PM  Readmission Risk Prevention Plan  Post Dischage Appt Complete  Medication Screening Complete  Transportation Screening Complete

## 2022-06-20 NOTE — Progress Notes (Signed)
Brief Palliative Medicine Progress Note:  PMT following peripherally for needs/decline.  Medical records reviewed including progress notes, labs, imaging. Patient has remained comfortable on Morphine '5mg'$  PO Q6H;  last required IV morphine for breakthrough pain on 1/25.   Goals of care remain clear for comfort-focused care and a peaceful, natural death when it is patient's time. Appreciate TOC assistance in supporting family through navigating an appropriate disposition option for hospice care. Per chart review, [facility] can accept patient with LOG - TOC following.  PMT will follow peripherally and visit with patient and family incrementally for goals of care discussions/symptom management needs as appropriate and based on clinical course. Family also has PMT contact information should further needs arise.  Thank you for allowing PMT to assist in the care of this patient.  Jozie Wulf M. [facility] Palliative Medicine Team Team Phone: [phone] NO CHARGE

## 2022-06-20 NOTE — Progress Notes (Signed)
PROGRESS NOTE    Richard Kent  WUX:[id number] DOB: December 09, 1928 DOA: 05/29/2022 PCP: Denita Lung, MD   Brief Narrative:  Richard Kent is a 89 y.o. male with PMH CAD, HTN, HLD, nephrolithiasis, DM II, PAF, dementia who was brought to the hospital for abdominal/right groin pain and and a fall out of bed.  He was found to be hypoxic on room air requiring oxygen. CTA chest was negative for PE and showed bilateral lower lobe atelectasis/scarring and concern for developing infiltrates in the left lower lobe. He was also found to be positive for COVID-19.  He was started on remdesivir and Unasyn on admission. He also underwent workup for dry gangrene involving left toes.     **Interim History Palliative evaluated and comfort measures were enacted.  He appears comfortable now and currently awaiting disposition for SNF.  Currently family is not interested in resending comfort measures for rehab admission and are currently exploring VA benefits and starting the Medicaid application.  TOC assisting with discharge disposition and family states that SNF is not a good option and they cannot take care of the patient at home.  Currently not a inpatient hospice candidate and family cannot pay for LTC out-of-pocket so case worker has provided information to apply for Medicaid to help with cost of LTC and family will start Medicaid application but would like the patient to be transferred to [facility] once he meets criteria.   **06/16/22: CSW following up with the Surrounding Facilities to determine any could review for LOG/Medicaid pending placement when Medicaid application has been established and patient also need disability approved to get LTC payments and this will require most facilities are need an LOG and please see the TOC note  of the list of facilities.   As of now he still remains medically stable for discharge once disposition is determined.  Per [name] confirm that they can take the patient  with LOG but caseworker needs an Medicaid approval and application is still in process.  Patient's daughter Claiborne Billings is handling the Medicaid paperwork but she is out of town  Assessment and Plan:  Comfort measures only status  Severe sepsis due to covid pna and bacterial pna Acute respiratory failure with hypoxia COVID-19 infection Left lower lobe pneumonia: COVID 19 pneumonia with possibly combination of aspiration pneumonia CAD Chronic bilateral lower extremity edema/lymphedema Gangrenous changes of toes of left foot/peripheral arterial disease Hypertension Leukocytosis Abnormal/Elevated LFTs Hypoalbuminemia Hyponatremia  Diabetes mellitus type 2 with neuropathy Paroxysmal A-fib Dementia Goals of care Physical deconditioning   Plan -Family decided to pursue comfort measures on 06/05/2022.  Palliative care following intermittently    -Does not appear to be imminently dying and prognosis is not <2 weeks, but he does have potential to decline at any time -Dispo remains issue; family unable to care for at home and private pay SNF/LTC is not feasible/realistic.  -Continue working with PT/OT and we are trying to see if patient eligible for rehab placement but after further discussion with palliative the patient is to remain full comfort measures and family is not interested in rescinding comfort measures for rehab admission and are exploring [place] benefits of starting the Medicaid application as family states that SNF is not a good option and they cannot take care of the patient at home and family cannot pay for LTC out-of-pocket.  Currently he is not a inpatient hospice candidate but family and noted that they will start the Medicaid application and would like the patient be  transferred to [facility] once he meets criteria.   Patient remains on on 5 mg p.o. every 6 morphine last required IV morphine for breakthrough pain on 06/15/2022 but appears comfortable today.  After  discussion with palliative they will continue to follow intermittently and the goals remain clear for comfort focused care and peaceful natural death wonders the patient is time for the palliative discussion.  DVT prophylaxis: None (Eliquis Discontinue in the setting of Comfort Measures)       Code Status: DNR Family Communication: No family present at bedside   Disposition Plan:  Level of care: Med-Surg Status is: Inpatient Remains inpatient appropriate because: TOC cannot find a disposition for this patient given that he has a difficult decision as above and patient's family would like to go to residential hospice once he is eligible      Consultants:  Palliative Care Medicine   Procedures:  As delineated as above  Antimicrobials:  Anti-infectives (From admission, onward)    Start     Dose/Rate Route Frequency Ordered Stop   05/31/22 1000  remdesivir 100 mg in sodium chloride 0.9 % 100 mL IVPB       See Hyperspace for full Linked Orders Report.   100 mg 200 mL/hr over 30 Minutes Intravenous Daily 05/30/22 0807 06/01/22 1055   05/30/22 0900  remdesivir 200 mg in sodium chloride 0.9% 250 mL IVPB       See Hyperspace for full Linked Orders Report.   200 mg 580 mL/hr over 30 Minutes Intravenous Once 05/30/22 0807 05/30/22 1220   05/30/22 0400  Ampicillin-Sulbactam (UNASYN) 3 g in sodium chloride 0.9 % 100 mL IVPB        3 g 200 mL/hr over 30 Minutes Intravenous Every 6 hours 05/30/22 0333 06/03/22 2311   05/29/22 2230  cefTRIAXone (ROCEPHIN) 1 g in sodium chloride 0.9 % 100 mL IVPB        1 g 200 mL/hr over 30 Minutes Intravenous  Once 05/29/22 2227 05/29/22 2316   05/29/22 2230  azithromycin (ZITHROMAX) 500 mg in sodium chloride 0.9 % 250 mL IVPB        500 mg 250 mL/hr over 60 Minutes Intravenous  Once 05/29/22 2227 05/30/22 0022       Subjective: Seen and examined at bedside and he is awake and alert and no complaints.  Asked how he is feeling he states "great".  No nausea  or vomiting.  No other concerns or complaints at this time.  Objective: Vitals:   06/18/22 0210 06/19/22 0359 06/19/22 0943 06/19/22 2012  BP: 112/70 (!) 158/84 103/61 123/80  Pulse: 68 91 84 77  Resp: '18 19 17 18  '$ Temp: 98 F (36.7 C) (!) 97.4 F (36.3 C) 97.7 F (36.5 C) 98.3 F (36.8 C)  TempSrc: Oral Oral Oral Oral  SpO2: 98% 98% (!) 84% 96%  Weight:      Height:        Intake/Output Summary (Last 24 hours) at 06/20/2022 1422 Last data filed at 06/20/2022 0500 Gross per 24 hour  Intake 340 ml  Output 400 ml  Net -60 ml   Filed Weights   06/01/22 0419 06/02/22 0500 06/03/22 0500  Weight: 84.5 kg 84.5 kg 84.9 kg   Examination: Physical Exam:  Constitutional: Thin elderly chronically ill-appearing Caucasian male in no acute distress Respiratory: Diminished to auscultation bilaterally, no wheezing, rales, rhonchi or crackles. Normal respiratory effort and patient is not tachypenic. No accessory muscle use.  Unlabored  breathing Cardiovascular: RRR, no murmurs / rubs / gallops.  Abdomen: Soft, non-tender, non-distended.  Bowel sounds positive.  GU: Deferred. Musculoskeletal: No clubbing / cyanosis of digits/nails.  No joint deformities in the upper and lower extremities Skin: No rashes, lesions, ulcers on a limited skin evaluation. No induration; Warm and dry.  Neurologic: CN 2-12 grossly intact with no focal deficits. Romberg sign cerebellar reflexes not assessed.  Psychiatric: Normal judgment and insight. Awake and alert  Data Reviewed: I have personally reviewed following labs and imaging studies  CBC: No results for input(s): "WBC", "NEUTROABS", "HGB", "HCT", "MCV", "PLT" in the last 168 hours. Basic Metabolic Panel: No results for input(s): "NA", "K", "CL", "CO2", "GLUCOSE", "BUN", "CREATININE", "CALCIUM", "MG", "PHOS" in the last 168 hours. GFR: Estimated Creatinine Clearance: 45.5 mL/min (by C-G formula based on SCr of 1.16 mg/dL). Liver Function Tests: No  results for input(s): "AST", "ALT", "ALKPHOS", "BILITOT", "PROT", "ALBUMIN" in the last 168 hours. No results for input(s): "LIPASE", "AMYLASE" in the last 168 hours. No results for input(s): "AMMONIA" in the last 168 hours. Coagulation Profile: No results for input(s): "INR", "PROTIME" in the last 168 hours. Cardiac Enzymes: No results for input(s): "CKTOTAL", "CKMB", "CKMBINDEX", "TROPONINI" in the last 168 hours. BNP (last 3 results) No results for input(s): "PROBNP" in the last 8760 hours. HbA1C: No results for input(s): "HGBA1C" in the last 72 hours. CBG: Recent Labs  Lab 06/13/22 2159  GLUCAP 158*   Lipid Profile: No results for input(s): "CHOL", "HDL", "LDLCALC", "TRIG", "CHOLHDL", "LDLDIRECT" in the last 72 hours. Thyroid Function Tests: No results for input(s): "TSH", "T4TOTAL", "FREET4", "T3FREE", "THYROIDAB" in the last 72 hours. Anemia Panel: No results for input(s): "VITAMINB12", "FOLATE", "FERRITIN", "TIBC", "IRON", "RETICCTPCT" in the last 72 hours. Sepsis Labs: No results for input(s): "PROCALCITON", "LATICACIDVEN" in the last 168 hours.  No results found for this or any previous visit (from the past 240 hour(s)).   Radiology Studies: No results found.  Scheduled Meds:  antiseptic oral rinse  15 mL Topical BID   furosemide  10 mg Oral Daily   gabapentin  400 mg Oral BID   morphine CONCENTRATE  5 mg Oral Q6H   Continuous Infusions:   LOS: 22 days   Raiford Noble, DO [facility] Available via Epic secure chat 7am-7pm After these hours, please refer to coverage provider listed on [email or web address] 06/20/2022, 2:22 PM

## 2022-06-21 DIAGNOSIS — J189 Pneumonia, unspecified organism: Secondary | ICD-10-CM | POA: Diagnosis not present

## 2022-06-21 DIAGNOSIS — J9601 Acute respiratory failure with hypoxia: Secondary | ICD-10-CM | POA: Diagnosis not present

## 2022-06-21 DIAGNOSIS — I4891 Unspecified atrial fibrillation: Secondary | ICD-10-CM | POA: Diagnosis not present

## 2022-06-21 DIAGNOSIS — U071 COVID-19: Secondary | ICD-10-CM | POA: Diagnosis not present

## 2022-06-21 NOTE — Progress Notes (Signed)
[facility]  PROGRESS NOTE  Richard Kent JGO:[id number] DOB: Sep 15, 1928 DOA: 05/29/2022 PCP: Denita Lung, MD   Brief HPI:     Richard Kent is a 89 y.o. male with PMH CAD, HTN, HLD, nephrolithiasis, DM II, PAF, dementia who was brought to the hospital for abdominal/right groin pain and and a fall out of bed.  He was found to be hypoxic on room air requiring oxygen. CTA chest was negative for PE and showed bilateral lower lobe atelectasis/scarring and concern for developing infiltrates in the left lower lobe. He was also found to be positive for COVID-19.  He was started on remdesivir and Unasyn on admission. He also underwent workup for dry gangrene involving left toes.    Palliative evaluated and comfort measures were enacted.  He appears comfortable now and currently awaiting disposition for SNF.  Currently family is not interested in resending comfort measures for rehab admission and are currently exploring VA benefits and starting the Medicaid application.  TOC assisting with discharge disposition and family states that SNF is not a good option and they cannot take care of the patient at home.  Currently not a inpatient hospice candidate and family cannot pay for LTC out-of-pocket so case worker has provided information to apply for Medicaid to help with cost of LTC and family will start Medicaid application but would like the patient to be transferred to [facility] once he meets criteria.  Subjective   Patient is somnolent, appears comfortable   Assessment/Plan:   Comfort measures only status  Severe sepsis due to covid pna and bacterial pna Acute respiratory failure with hypoxia COVID-19 infection Left lower lobe pneumonia: COVID 19 pneumonia with possibly combination of aspiration pneumonia CAD Chronic bilateral lower extremity edema/lymphedema Gangrenous changes of toes of left foot/peripheral arterial disease Hypertension Leukocytosis Abnormal/Elevated  LFTs Hypoalbuminemia Hyponatremia  Diabetes mellitus type 2 with neuropathy Paroxysmal A-fib Dementia Goals of care Physical deconditioning   Plan -Family decided to pursue comfort measures on 06/05/2022.  Palliative care following intermittently    -Does not appear to be imminently dying and prognosis is not <2 weeks, but he does have potential to decline at any time -Dispo remains issue; family unable to care for at home and private pay SNF/LTC is not feasible/realistic.  -Continue working with PT/OT and we are trying to see if patient eligible for rehab placement but after further discussion with palliative the patient is to remain full comfort measures and family is not interested in rescinding comfort measures for rehab admission and are exploring VA benefits of starting the Medicaid application as family states that SNF is not a good option and they cannot take care of the patient at home and family cannot pay for LTC out-of-pocket.  Currently he is not a inpatient hospice candidate but family and noted that they will start the Medicaid application and would like the patient be transferred to [facility] once he meets criteria.   Patient remains on on 5 mg p.o. every 6 morphine last required IV morphine for breakthrough pain on 06/15/2022 but appears comfortable today.  After discussion with palliative they will continue to follow intermittently and the goals remain clear for comfort focused care and peaceful natural death      Medications     antiseptic oral rinse  15 mL Topical BID   furosemide  10 mg Oral Daily   gabapentin  400 mg Oral BID   morphine CONCENTRATE  5 mg Oral Q6H  Data Reviewed:   CBG:  No results for input(s): "GLUCAP" in the last 168 hours.  SpO2: 97 % O2 Flow Rate (L/min): 2 L/min    Vitals:   06/19/22 2012 06/20/22 2040 06/21/22 0453 06/21/22 0920  BP: 123/80 (!) 144/86 124/75 132/75  Pulse: 77 82 66 83  Resp: '18 18 16 18   '$ Temp: 98.3 F (36.8 C) 97.7 F (36.5 C) 98.4 F (36.9 C) 97.7 F (36.5 C)  TempSrc: Oral Oral  Oral  SpO2: 96% 96% 98% 97%  Weight:      Height:          Data Reviewed:  Basic Metabolic Panel: No results for input(s): "NA", "K", "CL", "CO2", "GLUCOSE", "BUN", "CREATININE", "CALCIUM", "MG", "PHOS" in the last 168 hours.  CBC: No results for input(s): "WBC", "NEUTROABS", "HGB", "HCT", "MCV", "PLT" in the last 168 hours.  LFT No results for input(s): "AST", "ALT", "ALKPHOS", "BILITOT", "PROT", "ALBUMIN" in the last 168 hours.   Antibiotics: Anti-infectives (From admission, onward)    Start     Dose/Rate Route Frequency Ordered Stop   05/31/22 1000  remdesivir 100 mg in sodium chloride 0.9 % 100 mL IVPB       See Hyperspace for full Linked Orders Report.   100 mg 200 mL/hr over 30 Minutes Intravenous Daily 05/30/22 0807 06/01/22 1055   05/30/22 0900  remdesivir 200 mg in sodium chloride 0.9% 250 mL IVPB       See Hyperspace for full Linked Orders Report.   200 mg 580 mL/hr over 30 Minutes Intravenous Once 05/30/22 0807 05/30/22 1220   05/30/22 0400  Ampicillin-Sulbactam (UNASYN) 3 g in sodium chloride 0.9 % 100 mL IVPB        3 g 200 mL/hr over 30 Minutes Intravenous Every 6 hours 05/30/22 0333 06/03/22 2311   05/29/22 2230  cefTRIAXone (ROCEPHIN) 1 g in sodium chloride 0.9 % 100 mL IVPB        1 g 200 mL/hr over 30 Minutes Intravenous  Once 05/29/22 2227 05/29/22 2316   05/29/22 2230  azithromycin (ZITHROMAX) 500 mg in sodium chloride 0.9 % 250 mL IVPB        500 mg 250 mL/hr over 60 Minutes Intravenous  Once 05/29/22 2227 05/30/22 0022        DVT prophylaxis:   Code Status: DNR  Family Communication: Discussed with patient's wife at bedside   CONSULTS palliative care   Objective    Physical Examination:   General: Appears in no acute distress Cardiovascular: S1-S2, regular Respiratory: Lungs are clear to auscultation bilaterally Abdomen: Abdomen is  soft, nontender, no organomegaly Extremities: No edema in the lower extremities Neurologic: Somnolent but arousable   Status is: Inpatient:             [name]   [facility] If 7PM-7AM, please contact night-coverage at [email or web address], Office  [phone]   06/21/2022, 5:20 PM  LOS: 23 days

## 2022-06-21 NOTE — TOC Progression Note (Signed)
Transition of Care [facility]) - Progression Note    Patient Details  Name: Richard Kent MRN: [id number] Date of Birth: June 26, 1928  Transition of Care [facility]) CM/SW [place], [place] Phone Number: 06/21/2022, 12:56 PM  Clinical Narrative:    CSW attempted again to speak with dtr, [name], as family stated she is the one working on [place] and assisting with disposition. Voicemail was left requesting a return call to get an update. CSW notified liaison that family contact had not been established yet. They will follow for family interest and possible placement. TOC will continue to follow for DC needs.    Expected Discharge Plan: [place] Barriers to Discharge: Continued Medical Work up, SNF Pending bed offer  Expected Discharge Plan and Services     Post Acute Care Choice: [place] Living arrangements for the past 2 months: Single Family Home                                       Social Determinants of Health (SDOH) Interventions SDOH Screenings   Food Insecurity: No Food Insecurity (05/30/2022)  Housing: Low Risk  (05/30/2022)  Transportation Needs: No Transportation Needs (05/30/2022)  Utilities: Not At Risk (05/30/2022)  Depression (PHQ2-9): Low Risk  (09/09/2021)  Financial Resource Strain: Low Risk  (09/09/2021)  Physical Activity: Sufficiently Active (09/09/2021)  Stress: No Stress Concern Present (09/09/2021)  Tobacco Use: Medium Risk (05/31/2022)    Readmission Risk Interventions    09/28/2019    2:22 PM  Readmission Risk Prevention Plan  Post Dischage Appt Complete  Medication Screening Complete  Transportation Screening Complete

## 2022-06-22 DIAGNOSIS — I4891 Unspecified atrial fibrillation: Secondary | ICD-10-CM | POA: Diagnosis not present

## 2022-06-22 DIAGNOSIS — J9601 Acute respiratory failure with hypoxia: Secondary | ICD-10-CM | POA: Diagnosis not present

## 2022-06-22 DIAGNOSIS — U071 COVID-19: Secondary | ICD-10-CM | POA: Diagnosis not present

## 2022-06-22 DIAGNOSIS — Z515 Encounter for palliative care: Secondary | ICD-10-CM | POA: Diagnosis not present

## 2022-06-22 NOTE — Progress Notes (Signed)
[facility]  PROGRESS NOTE  [name] GLO:[id number] DOB: [birth year] DOA: 05/29/2022 PCP: Denita Lung, MD   Brief HPI:     Mr. Ketchum is a 89 y.o. male with PMH CAD, HTN, HLD, nephrolithiasis, DM II, PAF, dementia who was brought to the hospital for abdominal/right groin pain and and a fall out of bed.  He was found to be hypoxic on room air requiring oxygen. CTA chest was negative for PE and showed bilateral lower lobe atelectasis/scarring and concern for developing infiltrates in the left lower lobe. He was also found to be positive for COVID-19.  He was started on remdesivir and Unasyn on admission. He also underwent workup for dry gangrene involving left toes.    Palliative evaluated and comfort measures were enacted.  He appears comfortable now and currently awaiting disposition for SNF.  Currently family is not interested in resending comfort measures for rehab admission and are currently exploring VA benefits and starting the Medicaid application.  TOC assisting with discharge disposition and family states that SNF is not a good option and they cannot take care of the patient at home.  Currently not a inpatient hospice candidate and family cannot pay for LTC out-of-pocket so case worker has provided information to apply for Medicaid to help with cost of LTC and family will start Medicaid application but would like the patient to be transferred to [name] once he meets criteria.  Subjective   Patient seen and examined, denies any complaints.    Assessment/Plan:   Comfort measures only status  Severe sepsis due to covid pna and bacterial pna Acute respiratory failure with hypoxia COVID-19 infection Left lower lobe pneumonia: COVID 19 pneumonia with possibly combination of aspiration pneumonia CAD Chronic bilateral lower extremity edema/lymphedema Gangrenous changes of toes of left foot/peripheral arterial disease Hypertension Leukocytosis Abnormal/Elevated  LFTs Hypoalbuminemia Hyponatremia  Diabetes mellitus type 2 with neuropathy Paroxysmal A-fib Dementia Goals of care Physical deconditioning   Plan -Family decided to pursue comfort measures on 06/05/2022.  Palliative care following intermittently    -Does not appear to be imminently dying and prognosis is not <2 weeks, but he does have potential to decline at any time -Dispo remains issue; family unable to care for at home and private pay SNF/LTC is not feasible/realistic.  -Continue working with PT/OT and we are trying to see if patient eligible for rehab placement but after further discussion with palliative the patient is to remain full comfort measures and family is not interested in rescinding comfort measures for rehab admission and are exploring VA benefits of starting the Medicaid application as family states that SNF is not a good option and they cannot take care of the patient at home and family cannot pay for LTC out-of-pocket.  Currently he is not a inpatient hospice candidate but family and noted that they will start the Medicaid application and would like the patient be transferred to [facility] once he meets criteria.   Patient remains on on 5 mg p.o. every 6 morphine last required IV morphine for breakthrough pain on 06/15/2022 but appears comfortable today.  After discussion with palliative they will continue to follow intermittently and the goals remain clear for comfort focused care and peaceful natural death      Medications     antiseptic oral rinse  15 mL Topical BID   furosemide  10 mg Oral Daily   gabapentin  400 mg Oral BID   morphine CONCENTRATE  5 mg Oral Q6H  Data Reviewed:   CBG:  No results for input(s): "GLUCAP" in the last 168 hours.  SpO2: 94 % O2 Flow Rate (L/min): 2 L/min    Vitals:   06/20/22 2040 06/21/22 0453 06/21/22 0920 06/22/22 0451  BP: (!) 144/86 124/75 132/75 (!) 162/97  Pulse: 82 66 83 99  Resp: '18 16 18  18  '$ Temp: 97.7 F (36.5 C) 98.4 F (36.9 C) 97.7 F (36.5 C) 98.3 F (36.8 C)  TempSrc: Oral  Oral Oral  SpO2: 96% 98% 97% 94%  Weight:      Height:          Data Reviewed:  Basic Metabolic Panel: No results for input(s): "NA", "K", "CL", "CO2", "GLUCOSE", "BUN", "CREATININE", "CALCIUM", "MG", "PHOS" in the last 168 hours.  CBC: No results for input(s): "WBC", "NEUTROABS", "HGB", "HCT", "MCV", "PLT" in the last 168 hours.  LFT No results for input(s): "AST", "ALT", "ALKPHOS", "BILITOT", "PROT", "ALBUMIN" in the last 168 hours.   Antibiotics: Anti-infectives (From admission, onward)    Start     Dose/Rate Route Frequency Ordered Stop   05/31/22 1000  remdesivir 100 mg in sodium chloride 0.9 % 100 mL IVPB       See Hyperspace for full Linked Orders Report.   100 mg 200 mL/hr over 30 Minutes Intravenous Daily 05/30/22 0807 06/01/22 1055   05/30/22 0900  remdesivir 200 mg in sodium chloride 0.9% 250 mL IVPB       See Hyperspace for full Linked Orders Report.   200 mg 580 mL/hr over 30 Minutes Intravenous Once 05/30/22 0807 05/30/22 1220   05/30/22 0400  Ampicillin-Sulbactam (UNASYN) 3 g in sodium chloride 0.9 % 100 mL IVPB        3 g 200 mL/hr over 30 Minutes Intravenous Every 6 hours 05/30/22 0333 06/03/22 2311   05/29/22 2230  cefTRIAXone (ROCEPHIN) 1 g in sodium chloride 0.9 % 100 mL IVPB        1 g 200 mL/hr over 30 Minutes Intravenous  Once 05/29/22 2227 05/29/22 2316   05/29/22 2230  azithromycin (ZITHROMAX) 500 mg in sodium chloride 0.9 % 250 mL IVPB        500 mg 250 mL/hr over 60 Minutes Intravenous  Once 05/29/22 2227 05/30/22 0022        DVT prophylaxis:   Code Status: DNR  Family Communication: Discussed with patient's wife at bedside   CONSULTS palliative care   Objective    Physical Examination:   Appears in no acute distress S1-S2, regular Neuro-alert, x 3, no focal deficit noted, following commands Lungs are clear to auscultation  bilaterally   Status is: Inpatient:             [name]   [facility] If 7PM-7AM, please contact night-coverage at [email or web address], Office  [phone]   06/22/2022, 11:37 AM  LOS: 24 days

## 2022-06-22 NOTE — TOC Progression Note (Signed)
Transition of Care [facility]) - Progression Note    Patient Details  Name: Jakin Pavao MRN: [id number] Date of Birth: Jul 10, 1928  Transition of Care [facility]) CM/SW [place], [place] Phone Number: 06/22/2022, 2:41 PM  Clinical Narrative:    CSW received a return call from dtr [place]. [name] states she is actively working on the [employer] application and has solicited the help of a former Event organiser. CSW provided info for [place], advised again of limited bed availability. Dtr notes understanding. She will update CSW on application status. TOC will continue to follow for DC needs.    Expected Discharge Plan: [place] Barriers to Discharge: Continued Medical Work up, SNF Pending bed offer  Expected Discharge Plan and Services     Post Acute Care Choice: [place] Living arrangements for the past 2 months: Single Family Home                                       Social Determinants of Health (SDOH) Interventions SDOH Screenings   Food Insecurity: No Food Insecurity (05/30/2022)  Housing: Low Risk  (05/30/2022)  Transportation Needs: No Transportation Needs (05/30/2022)  Utilities: Not At Risk (05/30/2022)  Depression (PHQ2-9): Low Risk  (09/09/2021)  Financial Resource Strain: Low Risk  (09/09/2021)  Physical Activity: Sufficiently Active (09/09/2021)  Stress: No Stress Concern Present (09/09/2021)  Tobacco Use: Medium Risk (05/31/2022)    Readmission Risk Interventions    09/28/2019    2:22 PM  Readmission Risk Prevention Plan  Post Dischage Appt Complete  Medication Screening Complete  Transportation Screening Complete

## 2022-06-23 DIAGNOSIS — J9601 Acute respiratory failure with hypoxia: Secondary | ICD-10-CM | POA: Diagnosis not present

## 2022-06-23 DIAGNOSIS — I4891 Unspecified atrial fibrillation: Secondary | ICD-10-CM | POA: Diagnosis not present

## 2022-06-23 DIAGNOSIS — U071 COVID-19: Secondary | ICD-10-CM | POA: Diagnosis not present

## 2022-06-23 DIAGNOSIS — Z515 Encounter for palliative care: Secondary | ICD-10-CM | POA: Diagnosis not present

## 2022-06-23 NOTE — Progress Notes (Signed)
[facility]  PROGRESS NOTE  Richard Kent UXL:[id number] DOB: 1928-07-15 DOA: 05/29/2022 PCP: Denita Lung, MD   Brief HPI:     Richard Kent is a 89 y.o. male with PMH CAD, HTN, HLD, nephrolithiasis, DM II, PAF, dementia who was brought to the hospital for abdominal/right groin pain and and a fall out of bed.  He was found to be hypoxic on room air requiring oxygen. CTA chest was negative for PE and showed bilateral lower lobe atelectasis/scarring and concern for developing infiltrates in the left lower lobe. He was also found to be positive for COVID-19.  He was started on remdesivir and Unasyn on admission. He also underwent workup for dry gangrene involving left toes.    Palliative evaluated and comfort measures were enacted.  He appears comfortable now and currently awaiting disposition for SNF.  Currently family is not interested in resending comfort measures for rehab admission and are currently exploring VA benefits and starting the Medicaid application.  TOC assisting with discharge disposition and family states that SNF is not a good option and they cannot take care of the patient at home.  Currently not a inpatient hospice candidate and family cannot pay for LTC out-of-pocket so case worker has provided information to apply for Medicaid to help with cost of LTC and family will start Medicaid application but would like the patient to be transferred to [facility] once he meets criteria.  Subjective   Patient seen and examined, denies any complaints.    Assessment/Plan:   Comfort measures only status  Severe sepsis due to covid pna and bacterial pna Acute respiratory failure with hypoxia COVID-19 infection Left lower lobe pneumonia: COVID 19 pneumonia with possibly combination of aspiration pneumonia CAD Chronic bilateral lower extremity edema/lymphedema Gangrenous changes of toes of left foot/peripheral arterial disease Hypertension Leukocytosis Abnormal/Elevated  LFTs Hypoalbuminemia Hyponatremia  Diabetes mellitus type 2 with neuropathy Paroxysmal A-fib Dementia Goals of care Physical deconditioning   Plan -Family decided to pursue comfort measures on 06/05/2022.  Palliative care following intermittently    -Does not appear to be imminently dying and prognosis is not <2 weeks, but he does have potential to decline at any time -Dispo remains issue; family unable to care for at home and private pay SNF/LTC is not feasible/realistic.  -Continue working with PT/OT and we are trying to see if patient eligible for rehab placement but after further discussion with palliative the patient is to remain full comfort measures and family is not interested in rescinding comfort measures for rehab admission and are exploring VA benefits of starting the Medicaid application as family states that SNF is not a good option and they cannot take care of the patient at home and family cannot pay for LTC out-of-pocket.  Currently he is not a inpatient hospice candidate but family and noted that they will start the Medicaid application and would like the patient be transferred to [facility] once he meets criteria.   Patient remains on on 5 mg p.o. every 6 morphine last required IV morphine for breakthrough pain on 06/15/2022 but appears comfortable today.  After discussion with palliative they will continue to follow intermittently and the goals remain clear for comfort focused care and peaceful natural death      Medications     antiseptic oral rinse  15 mL Topical BID   furosemide  10 mg Oral Daily   gabapentin  400 mg Oral BID   morphine CONCENTRATE  5 mg Oral Q6H  Data Reviewed:   CBG:  No results for input(s): "GLUCAP" in the last 168 hours.  SpO2: 95 % (correction) O2 Flow Rate (L/min): 2 L/min    Vitals:   06/21/22 0453 06/21/22 0920 06/22/22 0451 06/22/22 1959  BP: 124/75 132/75 (!) 162/97 120/86  Pulse: 66 83 99 97  Resp:  '16 18 18 18  '$ Temp: 98.4 F (36.9 C) 97.7 F (36.5 C) 98.3 F (36.8 C) 98.1 F (36.7 C)  TempSrc:  Oral Oral Oral  SpO2: 98% 97% 94% 95%  Weight:      Height:          Data Reviewed:  Basic Metabolic Panel: No results for input(s): "NA", "K", "CL", "CO2", "GLUCOSE", "BUN", "CREATININE", "CALCIUM", "MG", "PHOS" in the last 168 hours.  CBC: No results for input(s): "WBC", "NEUTROABS", "HGB", "HCT", "MCV", "PLT" in the last 168 hours.  LFT No results for input(s): "AST", "ALT", "ALKPHOS", "BILITOT", "PROT", "ALBUMIN" in the last 168 hours.   Antibiotics: Anti-infectives (From admission, onward)    Start     Dose/Rate Route Frequency Ordered Stop   05/31/22 1000  remdesivir 100 mg in sodium chloride 0.9 % 100 mL IVPB       See Hyperspace for full Linked Orders Report.   100 mg 200 mL/hr over 30 Minutes Intravenous Daily 05/30/22 0807 06/01/22 1055   05/30/22 0900  remdesivir 200 mg in sodium chloride 0.9% 250 mL IVPB       See Hyperspace for full Linked Orders Report.   200 mg 580 mL/hr over 30 Minutes Intravenous Once 05/30/22 0807 05/30/22 1220   05/30/22 0400  Ampicillin-Sulbactam (UNASYN) 3 g in sodium chloride 0.9 % 100 mL IVPB        3 g 200 mL/hr over 30 Minutes Intravenous Every 6 hours 05/30/22 0333 06/03/22 2311   05/29/22 2230  cefTRIAXone (ROCEPHIN) 1 g in sodium chloride 0.9 % 100 mL IVPB        1 g 200 mL/hr over 30 Minutes Intravenous  Once 05/29/22 2227 05/29/22 2316   05/29/22 2230  azithromycin (ZITHROMAX) 500 mg in sodium chloride 0.9 % 250 mL IVPB        500 mg 250 mL/hr over 60 Minutes Intravenous  Once 05/29/22 2227 05/30/22 0022        DVT prophylaxis:   Code Status: DNR  Family Communication: Discussed with patient's wife at bedside   CONSULTS palliative care   Objective    Physical Examination:   Appears in no acute distress S1-S2, regular Lungs clear to auscultation bilaterally Abdomen is soft, nontender, no  organomegaly   Status is: Inpatient:             [name]   [facility] If 7PM-7AM, please contact night-coverage at [email or web address], Office  [phone]   06/23/2022, 3:48 PM  LOS: 25 days

## 2022-06-23 NOTE — Progress Notes (Signed)
   Medical records reviewed. Patient remains comfortable on comfort-focused care. Family is in the process of applying for Medicaid to obtain long-term care placement with hospice follow up after discharge, or transfer to residential hospice if he declines significantly.   Discussed concerns regarding disposition with TOC. Reviewed MAR and ongoing symptom management with scheduled morphine and PRN ativan. Will continue intermittent visits and family support as needed.  Thank you for your referral and allowing PMT to assist in Mr. Buford Sequeira's care.   Dorthy Cooler, [facility] Palliative Medicine Team  Team Phone # [phone]   NO CHARGE

## 2022-06-23 NOTE — TOC Progression Note (Signed)
Transition of Care [facility]) - Progression Note    Patient Details  Name: Richard Kent MRN: [id number] Date of Birth: [birth year]  Transition of Care [place] [facility]) CM/SW [place], [place] Phone Number: 06/23/2022, 1:41 PM  Clinical Narrative:    CSW followed up with care team to discuss dispo and ensure the one identified is appropriate. MD noting medically pt is stable to DC, and an outpatient setting would be appropriate if an inpatient one is not available. Palliative also consulted. NP notes concerns for families ability to continue care for pt in an outpatient setting, even with extensive supports. CSW advised team of concerns that the process may be detrimental to the pt's comfort. Medicaid application has not been submitted yet, and will likely not be pending or assigned for weeks. The process can take several months at times. Palliative noting these concerns and agrees to bring them up with family and confirm dispo next meeting. TOC will continue to work toward a LTC placement and will continue to follow.    Expected Discharge Plan: [place] Barriers to Discharge: Continued Medical Work up, SNF Pending bed offer  Expected Discharge Plan and Services     Post Acute Care Choice: [place] arrangements for the past 2 months: Single Family Home                                       Social Determinants of Health (SDOH) Interventions SDOH Screenings   Food Insecurity: No Food Insecurity (05/30/2022)  Housing: Low Risk  (05/30/2022)  Transportation Needs: No Transportation Needs (05/30/2022)  Utilities: Not At Risk (05/30/2022)  Depression (PHQ2-9): Low Risk  (09/09/2021)  Financial Resource Strain: Low Risk  (09/09/2021)  Physical Activity: Sufficiently Active (09/09/2021)  Stress: No Stress Concern Present (09/09/2021)  Tobacco Use: Medium Risk (05/31/2022)    Readmission Risk Interventions     No data to display

## 2022-06-24 DIAGNOSIS — J9601 Acute respiratory failure with hypoxia: Secondary | ICD-10-CM | POA: Diagnosis not present

## 2022-06-24 DIAGNOSIS — U071 COVID-19: Secondary | ICD-10-CM | POA: Diagnosis not present

## 2022-06-24 DIAGNOSIS — Z515 Encounter for palliative care: Secondary | ICD-10-CM | POA: Diagnosis not present

## 2022-06-24 DIAGNOSIS — I4891 Unspecified atrial fibrillation: Secondary | ICD-10-CM | POA: Diagnosis not present

## 2022-06-24 NOTE — Progress Notes (Signed)
[facility]  PROGRESS NOTE  Richard Kent ENI:[id number] DOB: April 04, 1929 DOA: 05/29/2022 PCP: Denita Lung, MD   Brief HPI:     Richard Kent is a 89 y.o. male with PMH CAD, HTN, HLD, nephrolithiasis, DM II, PAF, dementia who was brought to the hospital for abdominal/right groin pain and and a fall out of bed.  He was found to be hypoxic on room air requiring oxygen. CTA chest was negative for PE and showed bilateral lower lobe atelectasis/scarring and concern for developing infiltrates in the left lower lobe. He was also found to be positive for COVID-19.  He was started on remdesivir and Unasyn on admission. He also underwent workup for dry gangrene involving left toes.    Palliative evaluated and comfort measures were enacted.  He appears comfortable now and currently awaiting disposition for SNF.  Currently family is not interested in resending comfort measures for rehab admission and are currently exploring VA benefits and starting the Medicaid application.  TOC assisting with discharge disposition and family states that SNF is not a good option and they cannot take care of the patient at home.  Currently not a inpatient hospice candidate and family cannot pay for LTC out-of-pocket so case worker has provided information to apply for Medicaid to help with cost of LTC and family will start Medicaid application but would like the patient to be transferred to [facility] once he meets criteria.  Subjective   Patient seen, denies any pain.  No chest pain or shortness of breath.    Assessment/Plan:   Comfort measures only status  Severe sepsis due to covid pna and bacterial pna Acute respiratory failure with hypoxia COVID-19 infection Left lower lobe pneumonia: COVID 19 pneumonia with possibly combination of aspiration pneumonia CAD Chronic bilateral lower extremity edema/lymphedema Gangrenous changes of toes of left foot/peripheral arterial  disease Hypertension Leukocytosis Abnormal/Elevated LFTs Hypoalbuminemia Hyponatremia  Diabetes mellitus type 2 with neuropathy Paroxysmal A-fib Dementia Goals of care Physical deconditioning   Plan -Family decided to pursue comfort measures on 06/05/2022.  Palliative care following intermittently    -Does not appear to be imminently dying and prognosis is not <2 weeks, but he does have potential to decline at any time -Dispo remains issue; family unable to care for at home and private pay SNF/LTC is not feasible/realistic.  -Continue working with PT/OT and we are trying to see if patient eligible for rehab placement but after further discussion with palliative the patient is to remain full comfort measures and family is not interested in rescinding comfort measures for rehab admission and are exploring VA benefits of starting the Medicaid application as family states that SNF is not a good option and they cannot take care of the patient at home and family cannot pay for LTC out-of-pocket.  Currently he is not a inpatient hospice candidate but family and noted that they will start the Medicaid application and would like the patient be transferred to [facility] once he meets criteria.   Patient remains on on 5 mg p.o. every 6 morphine last required IV morphine for breakthrough pain on 06/15/2022 but appears comfortable today.  After discussion with palliative they will continue to follow intermittently and the goals remain clear for comfort focused care and peaceful natural death      Medications     antiseptic oral rinse  15 mL Topical BID   furosemide  10 mg Oral Daily   gabapentin  400 mg Oral BID   morphine  CONCENTRATE  5 mg Oral Q6H     Data Reviewed:   CBG:  No results for input(s): "GLUCAP" in the last 168 hours.  SpO2: 96 % O2 Flow Rate (L/min): 2 L/min    Vitals:   06/22/22 0451 06/22/22 1959 06/24/22 0301 06/24/22 0739  BP: (!) 162/97 120/86  (!) 126/91 105/67  Pulse: 99 97 (!) 107 87  Resp: '18 18 16 16  '$ Temp: 98.3 F (36.8 C) 98.1 F (36.7 C) 98.2 F (36.8 C) 97.6 F (36.4 C)  TempSrc: Oral Oral  Oral  SpO2: 94% 95% 96% 96%  Weight:      Height:          Data Reviewed:  Basic Metabolic Panel: No results for input(s): "NA", "K", "CL", "CO2", "GLUCOSE", "BUN", "CREATININE", "CALCIUM", "MG", "PHOS" in the last 168 hours.  CBC: No results for input(s): "WBC", "NEUTROABS", "HGB", "HCT", "MCV", "PLT" in the last 168 hours.  LFT No results for input(s): "AST", "ALT", "ALKPHOS", "BILITOT", "PROT", "ALBUMIN" in the last 168 hours.   Antibiotics: Anti-infectives (From admission, onward)    Start     Dose/Rate Route Frequency Ordered Stop   05/31/22 1000  remdesivir 100 mg in sodium chloride 0.9 % 100 mL IVPB       See Hyperspace for full Linked Orders Report.   100 mg 200 mL/hr over 30 Minutes Intravenous Daily 05/30/22 0807 06/01/22 1055   05/30/22 0900  remdesivir 200 mg in sodium chloride 0.9% 250 mL IVPB       See Hyperspace for full Linked Orders Report.   200 mg 580 mL/hr over 30 Minutes Intravenous Once 05/30/22 0807 05/30/22 1220   05/30/22 0400  Ampicillin-Sulbactam (UNASYN) 3 g in sodium chloride 0.9 % 100 mL IVPB        3 g 200 mL/hr over 30 Minutes Intravenous Every 6 hours 05/30/22 0333 06/03/22 2311   05/29/22 2230  cefTRIAXone (ROCEPHIN) 1 g in sodium chloride 0.9 % 100 mL IVPB        1 g 200 mL/hr over 30 Minutes Intravenous  Once 05/29/22 2227 05/29/22 2316   05/29/22 2230  azithromycin (ZITHROMAX) 500 mg in sodium chloride 0.9 % 250 mL IVPB        500 mg 250 mL/hr over 60 Minutes Intravenous  Once 05/29/22 2227 05/30/22 0022        DVT prophylaxis:   Code Status: DNR  Family Communication: Discussed with patient's wife at bedside   CONSULTS palliative care   Objective    Physical Examination:   Appears in no acute distress S1-S2, regular, no murmur auscultated Abdomen is  soft, nontender, no organomegaly   Status is: Inpatient:             Richard Kent   [facility] If 7PM-7AM, please contact night-coverage at [email or web address], Office  [phone]   06/24/2022, 11:58 AM  LOS: 26 days

## 2022-06-25 DIAGNOSIS — Z515 Encounter for palliative care: Secondary | ICD-10-CM | POA: Diagnosis not present

## 2022-06-25 DIAGNOSIS — U071 COVID-19: Secondary | ICD-10-CM | POA: Diagnosis not present

## 2022-06-25 DIAGNOSIS — J9601 Acute respiratory failure with hypoxia: Secondary | ICD-10-CM | POA: Diagnosis not present

## 2022-06-25 DIAGNOSIS — I4891 Unspecified atrial fibrillation: Secondary | ICD-10-CM | POA: Diagnosis not present

## 2022-06-25 NOTE — Progress Notes (Signed)
[facility]  PROGRESS NOTE  Richard Kent [id number]:[id number] DOB: 13-Feb-1929 DOA: 05/29/2022 PCP: Richard Lung, MD   Brief HPI:     Richard Kent is a 89 y.o. male with PMH CAD, HTN, HLD, nephrolithiasis, DM II, PAF, dementia who was brought to the hospital for abdominal/right groin pain and and a fall out of bed.  He was found to be hypoxic on room air requiring oxygen. CTA chest was negative for PE and showed bilateral lower lobe atelectasis/scarring and concern for developing infiltrates in the left lower lobe. He was also found to be positive for COVID-19.  He was started on remdesivir and Unasyn on admission. He also underwent workup for dry gangrene involving left toes.    Palliative evaluated and comfort measures were enacted.  He appears comfortable now and currently awaiting disposition for SNF.  Currently family is not interested in resending comfort measures for rehab admission and are currently exploring VA benefits and starting the Medicaid application.  TOC assisting with discharge disposition and family states that SNF is not a good option and they cannot take care of the patient at home.  Currently not a inpatient hospice candidate and family cannot pay for LTC out-of-pocket so case worker has provided information to apply for Medicaid to help with cost of LTC and family will start Medicaid application but would like the patient to be transferred to [facility] once he meets criteria.  Subjective   Denies chest pain or shortness of breath.    Assessment/Plan:   Comfort measures only status  Severe sepsis due to covid pna and bacterial pna Acute respiratory failure with hypoxia COVID-19 infection Left lower lobe pneumonia: COVID 19 pneumonia with possibly combination of aspiration pneumonia CAD Chronic bilateral lower extremity edema/lymphedema Gangrenous changes of toes of left foot/peripheral arterial disease Hypertension Leukocytosis Abnormal/Elevated  LFTs Hypoalbuminemia Hyponatremia  Diabetes mellitus type 2 with neuropathy Paroxysmal A-fib Dementia Goals of care Physical deconditioning   Plan -Family decided to pursue comfort measures on 06/05/2022.  Palliative care following intermittently    -Does not appear to be imminently dying and prognosis is not <2 weeks, but he does have potential to decline at any time -Dispo remains issue; family unable to care for at home and private pay SNF/LTC is not feasible/realistic.  -Continue working with PT/OT and we are trying to see if patient eligible for rehab placement but after further discussion with palliative the patient is to remain full comfort measures and family is not interested in rescinding comfort measures for rehab admission and are exploring VA benefits of starting the Medicaid application as family states that SNF is not a good option and they cannot take care of the patient at home and family cannot pay for LTC out-of-pocket.  Currently he is not a inpatient hospice candidate but family and noted that they will start the Medicaid application and would like the patient be transferred to [facility] once he meets criteria.   Patient remains on on 5 mg p.o. every 6 morphine last required IV morphine for breakthrough pain on 06/15/2022 but appears comfortable today.  After discussion with palliative they will continue to follow intermittently and the goals remain clear for comfort focused care and peaceful natural death      Medications     antiseptic oral rinse  15 mL Topical BID   furosemide  10 mg Oral Daily   gabapentin  400 mg Oral BID   morphine CONCENTRATE  5 mg Oral Q6H  Data Reviewed:   CBG:  No results for input(s): "GLUCAP" in the last 168 hours.  SpO2: 95 % O2 Flow Rate (L/min): 2 L/min    Vitals:   06/22/22 1959 06/24/22 0301 06/24/22 0739 06/25/22 0621  BP: 120/86 (!) 126/91 105/67 113/79  Pulse: 97 (!) 107 87 91  Resp: '18 16 16  18  '$ Temp: 98.1 F (36.7 C) 98.2 F (36.8 C) 97.6 F (36.4 C) 97.7 F (36.5 C)  TempSrc: Oral  Oral Oral  SpO2: 95% 96% 96% 95%  Weight:      Height:          Data Reviewed:  Basic Metabolic Panel: No results for input(s): "NA", "K", "CL", "CO2", "GLUCOSE", "BUN", "CREATININE", "CALCIUM", "MG", "PHOS" in the last 168 hours.  CBC: No results for input(s): "WBC", "NEUTROABS", "HGB", "HCT", "MCV", "PLT" in the last 168 hours.  LFT No results for input(s): "AST", "ALT", "ALKPHOS", "BILITOT", "PROT", "ALBUMIN" in the last 168 hours.   Antibiotics: Anti-infectives (From admission, onward)    Start     Dose/Rate Route Frequency Ordered Stop   05/31/22 1000  remdesivir 100 mg in sodium chloride 0.9 % 100 mL IVPB       See Hyperspace for full Linked Orders Report.   100 mg 200 mL/hr over 30 Minutes Intravenous Daily 05/30/22 0807 06/01/22 1055   05/30/22 0900  remdesivir 200 mg in sodium chloride 0.9% 250 mL IVPB       See Hyperspace for full Linked Orders Report.   200 mg 580 mL/hr over 30 Minutes Intravenous Once 05/30/22 0807 05/30/22 1220   05/30/22 0400  Ampicillin-Sulbactam (UNASYN) 3 g in sodium chloride 0.9 % 100 mL IVPB        3 g 200 mL/hr over 30 Minutes Intravenous Every 6 hours 05/30/22 0333 06/03/22 2311   05/29/22 2230  cefTRIAXone (ROCEPHIN) 1 g in sodium chloride 0.9 % 100 mL IVPB        1 g 200 mL/hr over 30 Minutes Intravenous  Once 05/29/22 2227 05/29/22 2316   05/29/22 2230  azithromycin (ZITHROMAX) 500 mg in sodium chloride 0.9 % 250 mL IVPB        500 mg 250 mL/hr over 60 Minutes Intravenous  Once 05/29/22 2227 05/30/22 0022        DVT prophylaxis:   Code Status: DNR  Family Communication: Discussed with patient's wife at bedside   CONSULTS palliative care   Objective    Physical Examination:   General-appears in no acute distress Heart-S1-S2, regular, no murmur auscultated Lungs-clear to auscultation bilaterally, no wheezing or  crackles auscultated Abdomen-soft, nontender, no organomegaly Extremities-no edema in the lower extremities Neuro-alert, oriented x3, no focal deficit noted   Status is: Inpatient:             Richard Kent   [facility] If 7PM-7AM, please contact night-coverage at [email or web address], Office  [phone]   06/25/2022, 2:18 PM  LOS: 27 days

## 2022-06-26 DIAGNOSIS — J9601 Acute respiratory failure with hypoxia: Secondary | ICD-10-CM | POA: Diagnosis not present

## 2022-06-26 DIAGNOSIS — U071 COVID-19: Secondary | ICD-10-CM | POA: Diagnosis not present

## 2022-06-26 DIAGNOSIS — Z515 Encounter for palliative care: Secondary | ICD-10-CM | POA: Diagnosis not present

## 2022-06-26 DIAGNOSIS — I4891 Unspecified atrial fibrillation: Secondary | ICD-10-CM | POA: Diagnosis not present

## 2022-06-26 NOTE — TOC Progression Note (Addendum)
Transition of Care [facility]) - Progression Note    Patient Details  Name: Richard Kent MRN: [id number] Date of Birth: Jul 25, 1928  Transition of Care [facility]) CM/SW [place], [place] Phone Number: 06/26/2022, 12:46 PM  Clinical Narrative:    CSW received call from [place] and an update was provided. VA liaison requested CSW remind family of form 60- 85 EZR, which may help with getting benefits. VM left for dtr. Plan is to continue following for a Medicaid application to be pending. LOG will be issued at that time. TOC is continuing to follow for DC needs.    Expected Discharge Plan: [place] Barriers to Discharge: Continued Medical Work up, SNF Pending bed offer  Expected Discharge Plan and Services     Post Acute Care Choice: [place] arrangements for the past 2 months: Single Family Home                                       Social Determinants of Health (SDOH) Interventions SDOH Screenings   Food Insecurity: No Food Insecurity (05/30/2022)  Housing: Low Risk  (05/30/2022)  Transportation Needs: No Transportation Needs (05/30/2022)  Utilities: Not At Risk (05/30/2022)  Depression (PHQ2-9): Low Risk  (09/09/2021)  Financial Resource Strain: Low Risk  (09/09/2021)  Physical Activity: Sufficiently Active (09/09/2021)  Stress: No Stress Concern Present (09/09/2021)  Tobacco Use: Medium Risk (05/31/2022)    Readmission Risk Interventions     No data to display

## 2022-06-26 NOTE — Progress Notes (Signed)
[facility]  PROGRESS NOTE  [name] QPY:[id number] DOB: 07/03/1928 DOA: 05/29/2022 PCP: Denita Lung, MD   Brief HPI:     Mr. Richard Kent is a 89 y.o. male with PMH CAD, HTN, HLD, nephrolithiasis, DM II, PAF, dementia who was brought to the hospital for abdominal/right groin pain and and a fall out of bed.  He was found to be hypoxic on room air requiring oxygen. CTA chest was negative for PE and showed bilateral lower lobe atelectasis/scarring and concern for developing infiltrates in the left lower lobe. He was also found to be positive for COVID-19.  He was started on remdesivir and Unasyn on admission. He also underwent workup for dry gangrene involving left toes.    Palliative evaluated and comfort measures were enacted.  He appears comfortable now and currently awaiting disposition for SNF.  Currently family is not interested in resending comfort measures for rehab admission and are currently exploring VA benefits and starting the Medicaid application.  TOC assisting with discharge disposition and family states that SNF is not a good option and they cannot take care of the patient at home.  Currently not a inpatient hospice candidate and family cannot pay for LTC out-of-pocket so case worker has provided information to apply for Medicaid to help with cost of LTC and family will start Medicaid application but would like the patient to be transferred to [facility] once he meets criteria.  Subjective   Patient seen, no new complaints.    Assessment/Plan:   Comfort measures only status  Severe sepsis due to covid pna and bacterial pna Acute respiratory failure with hypoxia COVID-19 infection Left lower lobe pneumonia: COVID 19 pneumonia with possibly combination of aspiration pneumonia CAD Chronic bilateral lower extremity edema/lymphedema Gangrenous changes of toes of left foot/peripheral arterial disease Hypertension Leukocytosis Abnormal/Elevated  LFTs Hypoalbuminemia Hyponatremia  Diabetes mellitus type 2 with neuropathy Paroxysmal A-fib Dementia Goals of care Physical deconditioning   Plan -Family decided to pursue comfort measures on 06/05/2022.  Palliative care following intermittently    -Does not appear to be imminently dying and prognosis is not <2 weeks, but he does have potential to decline at any time -Dispo remains issue; family unable to care for at home and private pay SNF/LTC is not feasible/realistic.  -Continue working with PT/OT and we are trying to see if patient eligible for rehab placement but after further discussion with palliative the patient is to remain full comfort measures and family is not interested in rescinding comfort measures for rehab admission and are exploring VA benefits of starting the Medicaid application as family states that SNF is not a good option and they cannot take care of the patient at home and family cannot pay for LTC out-of-pocket.  Currently he is not a inpatient hospice candidate but family and noted that they will start the Medicaid application and would like the patient be transferred to [facility] once he meets criteria.   Patient remains on on 5 mg p.o. every 6 morphine last required IV morphine for breakthrough pain on 06/15/2022 but appears comfortable today.  After discussion with palliative they will continue to follow intermittently and the goals remain clear for comfort focused care and peaceful natural death      Medications     antiseptic oral rinse  15 mL Topical BID   furosemide  10 mg Oral Daily   gabapentin  400 mg Oral BID   morphine CONCENTRATE  5 mg Oral Q6H  Data Reviewed:   CBG:  No results for input(s): "GLUCAP" in the last 168 hours.  SpO2: 95 % O2 Flow Rate (L/min): 2 L/min    Vitals:   06/22/22 1959 06/24/22 0301 06/24/22 0739 06/25/22 0621  BP: 120/86 (!) 126/91 105/67 113/79  Pulse: 97 (!) 107 87 91  Resp: '18 16 16  18  '$ Temp: 98.1 F (36.7 C) 98.2 F (36.8 C) 97.6 F (36.4 C) 97.7 F (36.5 C)  TempSrc: Oral  Oral Oral  SpO2: 95% 96% 96% 95%  Weight:      Height:          Data Reviewed:  Basic Metabolic Panel: No results for input(s): "NA", "K", "CL", "CO2", "GLUCOSE", "BUN", "CREATININE", "CALCIUM", "MG", "PHOS" in the last 168 hours.  CBC: No results for input(s): "WBC", "NEUTROABS", "HGB", "HCT", "MCV", "PLT" in the last 168 hours.  LFT No results for input(s): "AST", "ALT", "ALKPHOS", "BILITOT", "PROT", "ALBUMIN" in the last 168 hours.   Antibiotics: Anti-infectives (From admission, onward)    Start     Dose/Rate Route Frequency Ordered Stop   05/31/22 1000  remdesivir 100 mg in sodium chloride 0.9 % 100 mL IVPB       See Hyperspace for full Linked Orders Report.   100 mg 200 mL/hr over 30 Minutes Intravenous Daily 05/30/22 0807 06/01/22 1055   05/30/22 0900  remdesivir 200 mg in sodium chloride 0.9% 250 mL IVPB       See Hyperspace for full Linked Orders Report.   200 mg 580 mL/hr over 30 Minutes Intravenous Once 05/30/22 0807 05/30/22 1220   05/30/22 0400  Ampicillin-Sulbactam (UNASYN) 3 g in sodium chloride 0.9 % 100 mL IVPB        3 g 200 mL/hr over 30 Minutes Intravenous Every 6 hours 05/30/22 0333 06/03/22 2311   05/29/22 2230  cefTRIAXone (ROCEPHIN) 1 g in sodium chloride 0.9 % 100 mL IVPB        1 g 200 mL/hr over 30 Minutes Intravenous  Once 05/29/22 2227 05/29/22 2316   05/29/22 2230  azithromycin (ZITHROMAX) 500 mg in sodium chloride 0.9 % 250 mL IVPB        500 mg 250 mL/hr over 60 Minutes Intravenous  Once 05/29/22 2227 05/30/22 0022        DVT prophylaxis:   Code Status: DNR  Family Communication: Discussed with patient's wife at bedside   CONSULTS palliative care   Objective    Physical Examination:  Appears in no acute distress S1-S2, regular Lungs clear to auscultation bilaterally Abdomen is soft, nontender, no organomegaly   Status is:  Inpatient:             [name]   [facility] If 7PM-7AM, please contact night-coverage at [email or web address], Office  [phone]   06/26/2022, 10:10 AM  LOS: 28 days

## 2022-06-26 NOTE — Progress Notes (Signed)
Daily Progress Note   Patient Name: Richard Kent       Date: 06/26/2022 DOB: 08-29-1928  Age: 89 y.o. MRN#: [id number] Attending Physician: [name], MD Primary Care Physician: Denita Lung, MD Admit Date: 05/29/2022  Reason for Consultation/Follow-up: Establishing goals of care  Subjective: Medical records reviewed.  Patient assessed at the bedside.  He is sitting up comfortably, eating his dinner and in good spirits.  No family present during my visit.  I attempted to call patient's daughter but was unable to reach.  Voicemail with PMT contact information was provided.  PMT will continue to support holistically.   Length of Stay: 28  Physical Exam Vitals and nursing note reviewed.  Constitutional:      General: He is not in acute distress.    Appearance: He is ill-appearing.  Cardiovascular:     Rate and Rhythm: Normal rate.  Pulmonary:     Effort: No respiratory distress.  Skin:    General: Skin is warm and dry.  Neurological:     Mental Status: He is alert.     Motor: Weakness present.            Vital Signs: BP 113/79 (BP Location: Left Arm)   Pulse 91   Temp 97.7 F (36.5 C) (Oral)   Resp 18   Ht 6' (1.829 m)   Wt 84.9 kg   SpO2 95%   BMI 25.38 kg/m  SpO2: SpO2: 95 % O2 Device: O2 Device: Room Air O2 Flow Rate: O2 Flow Rate (L/min): 2 L/min       Palliative Assessment/Data: PPS 30%   Palliative Care Assessment & Plan   Patient Profile: 89 y.o. male  with past medical history of  CAD, hypertension, hyperlipidemia, nephrolithiasis, type 2 diabetes, paroxysmal A-fib on Eliquis, dementia, bilateral lower extremity lymphedema/venous stasis dermatitis admitted on 05/29/2022 with abdominal/right groin pain following an unwitnessed fall out of his bed.     Patient is admitted for acute hypoxemic respiratory failure secondary to COVID-19 infection and left lower lobe pneumonia, concerning for aspiration. PMT has been consulted to assist with goals of care conversation.  Assessment: Principal Problem:   COVID-19 virus infection Active Problems:   Hyperlipidemia LDL goal <100   Hypertension   Type II diabetes mellitus with complication (HCC)   Atrial fibrillation ([place])  Pneumonia   Acute hypoxemic respiratory failure (HCC)   CAD (coronary artery disease)   PVD (peripheral vascular disease) (HCC)   Comfort measures only status     Recommendations/Plan: Continue DNR Continue comfort care no adjustments to medications required today Goal is for transition to long-term care with hospice after Medicaid application is pending, versus transition to residential hospice if he significantly declines in the meantime PMT will continue to follow and support intermittently  Symptom Management Continue morphine q6h; continue PRN doses for breakthrough symptoms  Tylenol PRN pain/fever Biotin twice daily Robinul PRN secretions Haldol PRN agitation/delirium Ativan PRN anxiety/seizure/sleep/distress Zofran PRN nausea/vomiting Liquifilm Tears PRN dry eye Lasix daily Senna and dulcolax PRN constipation  Prognosis:  Poor  Discharge Planning: To Be Determined   Total time: I spent 25 minutes in the care of the patient today in the above activities and documenting the encounter.    Dorthy Cooler, PA-C Palliative Medicine Team Team phone # [phone]  Thank you for allowing the Palliative Medicine Team to assist in the care of this patient. Please utilize secure chat with additional questions, if there is no response within 30 minutes please call the above phone number.  Palliative Medicine Team providers are available by phone from 7am to 7pm daily and can be reached through the team cell phone.  Should this patient require  assistance outside of these hours, please call the patient's attending physician.  Portions of this note are a verbal dictation therefore any spelling and/or grammatical errors are due to the "[employer] [employer]" system interpretation.

## 2022-06-27 DIAGNOSIS — I4891 Unspecified atrial fibrillation: Secondary | ICD-10-CM | POA: Diagnosis not present

## 2022-06-27 DIAGNOSIS — U071 COVID-19: Secondary | ICD-10-CM | POA: Diagnosis not present

## 2022-06-27 DIAGNOSIS — J9601 Acute respiratory failure with hypoxia: Secondary | ICD-10-CM | POA: Diagnosis not present

## 2022-06-27 DIAGNOSIS — Z515 Encounter for palliative care: Secondary | ICD-10-CM | POA: Diagnosis not present

## 2022-06-27 NOTE — Progress Notes (Signed)
[facility]  PROGRESS NOTE  Richard Kent GEZ:[id number] DOB: 1928/07/22 DOA: 05/29/2022 PCP: Denita Lung, MD   Brief HPI:     Richard Kent is a 89 y.o. male with PMH CAD, HTN, HLD, nephrolithiasis, DM II, PAF, dementia who was brought to the hospital for abdominal/right groin pain and and a fall out of bed.  He was found to be hypoxic on room air requiring oxygen. CTA chest was negative for PE and showed bilateral lower lobe atelectasis/scarring and concern for developing infiltrates in the left lower lobe. He was also found to be positive for COVID-19.  He was started on remdesivir and Unasyn on admission. He also underwent workup for dry gangrene involving left toes.    Palliative evaluated and comfort measures were enacted.  He appears comfortable now and currently awaiting disposition for SNF.  Currently family is not interested in resending comfort measures for rehab admission and are currently exploring VA benefits and starting the Medicaid application.  TOC assisting with discharge disposition and family states that SNF is not a good option and they cannot take care of the patient at home.  Currently not a inpatient hospice candidate and family cannot pay for LTC out-of-pocket so case worker has provided information to apply for Medicaid to help with cost of LTC and family will start Medicaid application but would like the patient to be transferred to [facility] once he meets criteria.  Subjective   Patient seen and examined, denies shortness of breath.  Ate his breakfast this morning.   Assessment/Plan:   Comfort measures only status  Severe sepsis due to covid pna and bacterial pna Acute respiratory failure with hypoxia COVID-19 infection Left lower lobe pneumonia: COVID 19 pneumonia with possibly combination of aspiration pneumonia CAD Chronic bilateral lower extremity edema/lymphedema Gangrenous changes of toes of left foot/peripheral arterial  disease Hypertension Leukocytosis Abnormal/Elevated LFTs Hypoalbuminemia Hyponatremia  Diabetes mellitus type 2 with neuropathy Paroxysmal A-fib Dementia Goals of care Physical deconditioning   Plan -Family decided to pursue comfort measures on 06/05/2022.  Palliative care following intermittently    -Does not appear to be imminently dying and prognosis is not <2 weeks, but he does have potential to decline at any time -Dispo remains issue; family unable to care for at home and private pay SNF/LTC is not feasible/realistic.  -Continue working with PT/OT and we are trying to see if patient eligible for rehab placement but after further discussion with palliative the patient is to remain full comfort measures and family is not interested in rescinding comfort measures for rehab admission and are exploring VA benefits of starting the Medicaid application as family states that SNF is not a good option and they cannot take care of the patient at home and family cannot pay for LTC out-of-pocket.  Currently he is not a inpatient hospice candidate but family and noted that they will start the Medicaid application and would like the patient be transferred to [facility] once he meets criteria.   Patient remains on on 5 mg p.o. every 6 morphine last required IV morphine for breakthrough pain on 06/15/2022 but appears comfortable today.  After discussion with palliative they will continue to follow intermittently and the goals remain clear for comfort focused care and peaceful natural death      Medications     antiseptic oral rinse  15 mL Topical BID   furosemide  10 mg Oral Daily   gabapentin  400 mg Oral BID   morphine  CONCENTRATE  5 mg Oral Q6H     Data Reviewed:   CBG:  No results for input(s): "GLUCAP" in the last 168 hours.  SpO2: 100 % O2 Flow Rate (L/min): 2 L/min    Vitals:   06/24/22 0739 06/25/22 0621 06/26/22 1558 06/27/22 0903  BP: 105/67 113/79 93/70  (!) 146/85  Pulse: 87 91 (!) 52 79  Resp: '16 18 16   '$ Temp: 97.6 F (36.4 C) 97.7 F (36.5 C) 98.2 F (36.8 C)   TempSrc: Oral Oral Oral   SpO2: 96% 95% 100%   Weight:      Height:          Data Reviewed:  Basic Metabolic Panel: No results for input(s): "NA", "K", "CL", "CO2", "GLUCOSE", "BUN", "CREATININE", "CALCIUM", "MG", "PHOS" in the last 168 hours.  CBC: No results for input(s): "WBC", "NEUTROABS", "HGB", "HCT", "MCV", "PLT" in the last 168 hours.  LFT No results for input(s): "AST", "ALT", "ALKPHOS", "BILITOT", "PROT", "ALBUMIN" in the last 168 hours.   Antibiotics: Anti-infectives (From admission, onward)    Start     Dose/Rate Route Frequency Ordered Stop   05/31/22 1000  remdesivir 100 mg in sodium chloride 0.9 % 100 mL IVPB       See Hyperspace for full Linked Orders Report.   100 mg 200 mL/hr over 30 Minutes Intravenous Daily 05/30/22 0807 06/01/22 1055   05/30/22 0900  remdesivir 200 mg in sodium chloride 0.9% 250 mL IVPB       See Hyperspace for full Linked Orders Report.   200 mg 580 mL/hr over 30 Minutes Intravenous Once 05/30/22 0807 05/30/22 1220   05/30/22 0400  Ampicillin-Sulbactam (UNASYN) 3 g in sodium chloride 0.9 % 100 mL IVPB        3 g 200 mL/hr over 30 Minutes Intravenous Every 6 hours 05/30/22 0333 06/03/22 2311   05/29/22 2230  cefTRIAXone (ROCEPHIN) 1 g in sodium chloride 0.9 % 100 mL IVPB        1 g 200 mL/hr over 30 Minutes Intravenous  Once 05/29/22 2227 05/29/22 2316   05/29/22 2230  azithromycin (ZITHROMAX) 500 mg in sodium chloride 0.9 % 250 mL IVPB        500 mg 250 mL/hr over 60 Minutes Intravenous  Once 05/29/22 2227 05/30/22 0022        DVT prophylaxis:   Code Status: DNR  Family Communication: Discussed with patient's wife at bedside   CONSULTS palliative care   Objective    Physical Examination:  General-appears in no acute distress Heart-S1-S2, regular, no murmur auscultated Lungs-clear to auscultation  bilaterally, no wheezing or crackles auscultated Abdomen-soft, nontender, no organomegaly Extremities-no edema in the lower extremities    Status is: Inpatient:             Richard Kent   [facility] If 7PM-7AM, please contact night-coverage at [email or web address], Office  [phone]   06/27/2022, 10:11 AM  LOS: 29 days

## 2022-06-27 NOTE — Plan of Care (Signed)
  Problem: Education: Goal: Knowledge of General Education information will improve Description: Including pain rating scale, medication(s)/side effects and non-pharmacologic comfort measures Outcome: Progressing   Problem: Health Behavior/Discharge Planning: Goal: Ability to manage health-related needs will improve Outcome: Progressing   Problem: Clinical Measurements: Goal: Ability to maintain clinical measurements within normal limits will improve Outcome: Progressing Goal: Will remain free from infection Outcome: Progressing Goal: Diagnostic test results will improve Outcome: Progressing Goal: Respiratory complications will improve Outcome: Progressing Goal: Cardiovascular complication will be avoided Outcome: Progressing   Problem: Activity: Goal: Risk for activity intolerance will decrease Outcome: Progressing   Problem: Nutrition: Goal: Adequate nutrition will be maintained Outcome: Progressing   Problem: Coping: Goal: Level of anxiety will decrease Outcome: Progressing   Problem: Elimination: Goal: Will not experience complications related to bowel motility Outcome: Progressing Goal: Will not experience complications related to urinary retention Outcome: Progressing   Problem: Pain Managment: Goal: General experience of comfort will improve Outcome: Progressing   Problem: Safety: Goal: Ability to remain free from injury will improve Outcome: Progressing   Problem: Skin Integrity: Goal: Risk for impaired skin integrity will decrease Outcome: Progressing   Problem: Education: Goal: Ability to describe self-care measures that may prevent or decrease complications (Diabetes Survival Skills Education) will improve Outcome: Progressing Goal: Individualized Educational Video(s) Outcome: Progressing   Problem: Coping: Goal: Ability to adjust to condition or change in health will improve Outcome: Progressing   Problem: Fluid Volume: Goal: Ability to  maintain a balanced intake and output will improve Outcome: Progressing   Problem: Health Behavior/Discharge Planning: Goal: Ability to identify and utilize available resources and services will improve Outcome: Progressing Goal: Ability to manage health-related needs will improve Outcome: Progressing   Problem: Metabolic: Goal: Ability to maintain appropriate glucose levels will improve Outcome: Progressing   Problem: Nutritional: Goal: Maintenance of adequate nutrition will improve Outcome: Progressing Goal: Progress toward achieving an optimal weight will improve Outcome: Progressing   Problem: Skin Integrity: Goal: Risk for impaired skin integrity will decrease Outcome: Progressing   Problem: Tissue Perfusion: Goal: Adequacy of tissue perfusion will improve Outcome: Progressing   Problem: Education: Goal: Knowledge of risk factors and measures for prevention of condition will improve Outcome: Progressing   Problem: Coping: Goal: Psychosocial and spiritual needs will be supported Outcome: Progressing   Problem: Respiratory: Goal: Will maintain a patent airway Outcome: Progressing Goal: Complications related to the disease process, condition or treatment will be avoided or minimized Outcome: Progressing   Problem: Education: Goal: Knowledge of the prescribed therapeutic regimen will improve Outcome: Progressing   Problem: Coping: Goal: Ability to identify and develop effective coping behavior will improve Outcome: Progressing   Problem: Clinical Measurements: Goal: Quality of life will improve Outcome: Progressing   Problem: Respiratory: Goal: Verbalizations of increased ease of respirations will increase Outcome: Progressing   Problem: Role Relationship: Goal: Family's ability to cope with current situation will improve Outcome: Progressing Goal: Ability to verbalize concerns, feelings, and thoughts to partner or family member will improve Outcome:  Progressing   Problem: Pain Management: Goal: Satisfaction with pain management regimen will improve Outcome: Progressing

## 2022-06-28 DIAGNOSIS — U071 COVID-19: Secondary | ICD-10-CM | POA: Diagnosis not present

## 2022-06-28 NOTE — Progress Notes (Signed)
PROGRESS NOTE  [name]  DOB: 02-23-1929  PCP: Denita Lung, MD FBP:[id number]  DOA: 05/29/2022  LOS: 30 days  Hospital Day: 31  Brief narrative: Richard Kent is a 89 y.o. male with PMH significant for CAD, HTN, HLD, nephrolithiasis, DM II, PAF, dementia  Once received, patient was brought to the hospital for abdominal/right groin pain and and a fall out of bed.  He was found to be hypoxic on room air requiring oxygen. CTA chest was negative for PE and showed bilateral lower lobe atelectasis/scarring and concern for developing infiltrates in the left lower lobe. He was also found to be positive for COVID-19.  He was started on remdesivir and Unasyn on admission. He also underwent workup for dry gangrene involving left toes.    Palliative evaluated and comfort measures were enacted on 06/05/2022. Patient did not qualify for residential hospice. Family wants to continue comfort care but is not able to take him home with comfort measures. Social work involved to explore his benefits with VA. Palliative intermittently following as well. Currently patient remains on IV/oral pain medicines PRN.  Subjective: Patient was seen and examined this morning.  Elderly male.  Mittens in hands.  Not in distress.  Remains comfortable  Assessment and plan: Comfort care status Hospital course as above.  Currently on comfort care measures. Comfort care order set active. Patient did not qualify for residential hospice. Family wants to continue comfort care but is not able to take him home with comfort measures. Social work involved to explore his benefits with VA. Palliative intermittently following as well.  Other issues addressed this hospitalization were Severe sepsis due to covid pna and bacterial pna Acute respiratory failure with hypoxia COVID-19 infection Left lower lobe pneumonia: COVID 19 pneumonia with possibly combination of aspiration pneumonia CAD Chronic bilateral lower  extremity edema/lymphedema Gangrenous changes of toes of left foot/peripheral arterial disease Hypertension Leukocytosis Abnormal/Elevated LFTs Hypoalbuminemia Hyponatremia  Diabetes mellitus type 2 with neuropathy Paroxysmal A-fib Dementia Physical deconditioning  Goals of care   Code Status: DNR   Consultants: Palliative care Family Communication: None at bedside  Status is: Inpatient Level of care: Med-Surg   Dispo: Patient is from: Home              Anticipated d/c is to: Difficult to place   Scheduled Meds:  antiseptic oral rinse  15 mL Topical BID   furosemide  10 mg Oral Daily   gabapentin  400 mg Oral BID   morphine CONCENTRATE  5 mg Oral Q6H    PRN meds: acetaminophen **OR** acetaminophen, bisacodyl, glycopyrrolate **OR** glycopyrrolate, haloperidol **OR** haloperidol **OR** haloperidol lactate, hydrocortisone cream, LORazepam **OR** LORazepam, morphine injection, ondansetron **OR** ondansetron (ZOFRAN) IV, polyvinyl alcohol, senna   Infusions:    Diet:  Diet Order             DIET DYS 3 Room service appropriate? Yes with Assist; Fluid consistency: Thin  Diet effective now                   Antimicrobials: Anti-infectives (From admission, onward)    Start     Dose/Rate Route Frequency Ordered Stop   05/31/22 1000  remdesivir 100 mg in sodium chloride 0.9 % 100 mL IVPB       See Hyperspace for full Linked Orders Report.   100 mg 200 mL/hr over 30 Minutes Intravenous Daily 05/30/22 0807 06/01/22 1055   05/30/22 0900  remdesivir 200 mg in sodium chloride 0.9% 250 mL IVPB  See Hyperspace for full Linked Orders Report.   200 mg 580 mL/hr over 30 Minutes Intravenous Once 05/30/22 0807 05/30/22 1220   05/30/22 0400  Ampicillin-Sulbactam (UNASYN) 3 g in sodium chloride 0.9 % 100 mL IVPB        3 g 200 mL/hr over 30 Minutes Intravenous Every 6 hours 05/30/22 0333 06/03/22 2311   05/29/22 2230  cefTRIAXone (ROCEPHIN) 1 g in sodium chloride 0.9 %  100 mL IVPB        1 g 200 mL/hr over 30 Minutes Intravenous  Once 05/29/22 2227 05/29/22 2316   05/29/22 2230  azithromycin (ZITHROMAX) 500 mg in sodium chloride 0.9 % 250 mL IVPB        500 mg 250 mL/hr over 60 Minutes Intravenous  Once 05/29/22 2227 05/30/22 0022       Skin assessment:       Nutritional status:  Body mass index is 25.38 kg/m.          Objective: Vitals:   06/27/22 2013 06/28/22 0847  BP: (!) 145/64 127/79  Pulse: 98 78  Resp: 18 17  Temp: 98.2 F (36.8 C) 98 F (36.7 C)  SpO2: 96% 98%    Intake/Output Summary (Last 24 hours) at 06/28/2022 1358 Last data filed at 06/28/2022 0845 Gross per 24 hour  Intake 0 ml  Output 250 ml  Net -250 ml   Filed Weights   06/01/22 0419 06/02/22 0500 06/03/22 0500  Weight: 84.5 kg 84.5 kg 84.9 kg   Weight change:  Body mass index is 25.38 kg/m.   Physical Exam: General exam: Remains comfortable.  I did not do a detailed examination because of comfort care status.  Data Review: I have personally reviewed the laboratory data and studies available.   Total time spent in review of labs and imaging, patient evaluation, formulation of plan, documentation and communication with family: 25 minutes  Signed, Terrilee Croak, MD [facility] 06/28/2022

## 2022-06-28 NOTE — Plan of Care (Signed)
  Problem: Education: Goal: Knowledge of General Education information will improve Description: Including pain rating scale, medication(s)/side effects and non-pharmacologic comfort measures Outcome: Progressing   Problem: Health Behavior/Discharge Planning: Goal: Ability to manage health-related needs will improve Outcome: Progressing   Problem: Clinical Measurements: Goal: Ability to maintain clinical measurements within normal limits will improve Outcome: Progressing Goal: Will remain free from infection Outcome: Progressing Goal: Diagnostic test results will improve Outcome: Progressing Goal: Respiratory complications will improve Outcome: Progressing Goal: Cardiovascular complication will be avoided Outcome: Progressing   Problem: Activity: Goal: Risk for activity intolerance will decrease Outcome: Progressing   Problem: Nutrition: Goal: Adequate nutrition will be maintained Outcome: Progressing   Problem: Coping: Goal: Level of anxiety will decrease Outcome: Progressing   Problem: Elimination: Goal: Will not experience complications related to bowel motility Outcome: Progressing Goal: Will not experience complications related to urinary retention Outcome: Progressing   Problem: Pain Managment: Goal: General experience of comfort will improve Outcome: Progressing   Problem: Safety: Goal: Ability to remain free from injury will improve Outcome: Progressing   Problem: Skin Integrity: Goal: Risk for impaired skin integrity will decrease Outcome: Progressing   Problem: Education: Goal: Ability to describe self-care measures that may prevent or decrease complications (Diabetes Survival Skills Education) will improve Outcome: Progressing Goal: Individualized Educational Video(s) Outcome: Progressing   Problem: Coping: Goal: Ability to adjust to condition or change in health will improve Outcome: Progressing   Problem: Fluid Volume: Goal: Ability to  maintain a balanced intake and output will improve Outcome: Progressing   Problem: Health Behavior/Discharge Planning: Goal: Ability to identify and utilize available resources and services will improve Outcome: Progressing Goal: Ability to manage health-related needs will improve Outcome: Progressing   Problem: Metabolic: Goal: Ability to maintain appropriate glucose levels will improve Outcome: Progressing   Problem: Nutritional: Goal: Maintenance of adequate nutrition will improve Outcome: Progressing Goal: Progress toward achieving an optimal weight will improve Outcome: Progressing   Problem: Skin Integrity: Goal: Risk for impaired skin integrity will decrease Outcome: Progressing   Problem: Tissue Perfusion: Goal: Adequacy of tissue perfusion will improve Outcome: Progressing   Problem: Education: Goal: Knowledge of risk factors and measures for prevention of condition will improve Outcome: Progressing   Problem: Coping: Goal: Psychosocial and spiritual needs will be supported Outcome: Progressing   Problem: Respiratory: Goal: Will maintain a patent airway Outcome: Progressing Goal: Complications related to the disease process, condition or treatment will be avoided or minimized Outcome: Progressing   Problem: Education: Goal: Knowledge of the prescribed therapeutic regimen will improve Outcome: Progressing   Problem: Coping: Goal: Ability to identify and develop effective coping behavior will improve Outcome: Progressing   Problem: Clinical Measurements: Goal: Quality of life will improve Outcome: Progressing   Problem: Respiratory: Goal: Verbalizations of increased ease of respirations will increase Outcome: Progressing   Problem: Role Relationship: Goal: Family's ability to cope with current situation will improve Outcome: Progressing Goal: Ability to verbalize concerns, feelings, and thoughts to partner or family member will improve Outcome:  Progressing   Problem: Pain Management: Goal: Satisfaction with pain management regimen will improve Outcome: Progressing

## 2022-06-28 NOTE — TOC Progression Note (Signed)
Transition of Care [facility]) - Progression Note    Patient Details  Name: Richard Kent MRN: [id number] Date of Birth: 1928-12-31  Transition of Care [facility]) CM/SW [place], [place] Phone Number: 06/28/2022, 2:34 PM  Clinical Narrative:    CSW received a call from pt's daughter who noted she is waiting for DSS to call her back to complete the application process. She states that preliminary information had been provided but she will go in person to complete the application. Dtr will follow up with any updates.    Expected Discharge Plan: [place] Barriers to Discharge: Continued Medical Work up, SNF Pending bed offer  Expected Discharge Plan and Services     Post Acute Care Choice: [place] arrangements for the past 2 months: Single Family Home                                       Social Determinants of Health (SDOH) Interventions SDOH Screenings   Food Insecurity: No Food Insecurity (05/30/2022)  Housing: Low Risk  (05/30/2022)  Transportation Needs: No Transportation Needs (05/30/2022)  Utilities: Not At Risk (05/30/2022)  Depression (PHQ2-9): Low Risk  (09/09/2021)  Financial Resource Strain: Low Risk  (09/09/2021)  Physical Activity: Sufficiently Active (09/09/2021)  Stress: No Stress Concern Present (09/09/2021)  Tobacco Use: Medium Risk (05/31/2022)    Readmission Risk Interventions     No data to display

## 2022-06-29 DIAGNOSIS — U071 COVID-19: Secondary | ICD-10-CM | POA: Diagnosis not present

## 2022-06-29 NOTE — Progress Notes (Signed)
PROGRESS NOTE  [name]  DOB: 10-Aug-1928  PCP: Denita Lung, MD DTO:[id number]  DOA: 05/29/2022  LOS: 31 days  Hospital Day: 32  Brief narrative: Richard Kent is a 89 y.o. male with PMH significant for CAD, HTN, HLD, nephrolithiasis, DM II, PAF, dementia  Once received, patient was brought to the hospital for abdominal/right groin pain and and a fall out of bed.  He was found to be hypoxic on room air requiring oxygen. CTA chest was negative for PE and showed bilateral lower lobe atelectasis/scarring and concern for developing infiltrates in the left lower lobe. He was also found to be positive for COVID-19.  He was started on remdesivir and Unasyn on admission. He also underwent workup for dry gangrene involving left toes.    Palliative evaluated and comfort measures were enacted on 06/05/2022. Patient did not qualify for residential hospice. Family wants to continue comfort care but is not able to take him home with comfort measures. Social work involved to explore his benefits with [facility]. Palliative intermittently following as well. Currently patient remains on IV/oral pain medicines PRN.  Subjective: Patient was seen and examined this morning.  Elderly male.  Mittens in hands.  Not in distress.  Remains comfortable Per nursing staff, patient was found on the floor this morning.  He was sitting on a floor mat facing the restroom.  No new injury was identified.  Assessment and plan: Comfort care status Hospital course as above.  Currently on comfort care measures. Comfort care order set active. Patient did not qualify for residential hospice. Family wants to continue comfort care but is not able to take him home with comfort measures. Social work involved to explore his benefits with [facility]. Palliative intermittently following as well. Fall precautions on place  Other issues addressed this hospitalization were Severe sepsis due to covid pna and bacterial pna Acute respiratory  failure with hypoxia COVID-19 infection Left lower lobe pneumonia: COVID 19 pneumonia with possibly combination of aspiration pneumonia CAD Chronic bilateral lower extremity edema/lymphedema Gangrenous changes of toes of left foot/peripheral arterial disease Hypertension Leukocytosis Abnormal/Elevated LFTs Hypoalbuminemia Hyponatremia  Diabetes mellitus type 2 with neuropathy Paroxysmal A-fib Dementia Physical deconditioning  Goals of care   Code Status: DNR   Consultants: Palliative care Family Communication: None at bedside  Status is: Inpatient Level of care: Med-Surg   Dispo: Patient is from: Home              Anticipated d/c is to: Difficult to place   Scheduled Meds:  antiseptic oral rinse  15 mL Topical BID   furosemide  10 mg Oral Daily   gabapentin  400 mg Oral BID   morphine CONCENTRATE  5 mg Oral Q6H    PRN meds: acetaminophen **OR** acetaminophen, bisacodyl, glycopyrrolate **OR** glycopyrrolate, haloperidol **OR** haloperidol **OR** haloperidol lactate, hydrocortisone cream, LORazepam **OR** LORazepam, morphine injection, ondansetron **OR** ondansetron (ZOFRAN) IV, polyvinyl alcohol, senna   Infusions:    Diet:  Diet Order             DIET DYS 3 Room service appropriate? Yes with Assist; Fluid consistency: Thin  Diet effective now                   Antimicrobials: Anti-infectives (From admission, onward)    Start     Dose/Rate Route Frequency Ordered Stop   05/31/22 1000  remdesivir 100 mg in sodium chloride 0.9 % 100 mL IVPB       See Hyperspace for full Linked  Orders Report.   100 mg 200 mL/hr over 30 Minutes Intravenous Daily 05/30/22 0807 06/01/22 1055   05/30/22 0900  remdesivir 200 mg in sodium chloride 0.9% 250 mL IVPB       See Hyperspace for full Linked Orders Report.   200 mg 580 mL/hr over 30 Minutes Intravenous Once 05/30/22 0807 05/30/22 1220   05/30/22 0400  Ampicillin-Sulbactam (UNASYN) 3 g in sodium chloride 0.9 % 100 mL  IVPB        3 g 200 mL/hr over 30 Minutes Intravenous Every 6 hours 05/30/22 0333 06/03/22 2311   05/29/22 2230  cefTRIAXone (ROCEPHIN) 1 g in sodium chloride 0.9 % 100 mL IVPB        1 g 200 mL/hr over 30 Minutes Intravenous  Once 05/29/22 2227 05/29/22 2316   05/29/22 2230  azithromycin (ZITHROMAX) 500 mg in sodium chloride 0.9 % 250 mL IVPB        500 mg 250 mL/hr over 60 Minutes Intravenous  Once 05/29/22 2227 05/30/22 0022       Skin assessment:       Nutritional status:  Body mass index is 25.38 kg/m.          Objective: Vitals:   06/29/22 0543 06/29/22 1041  BP: (!) 146/102 (!) 142/79  Pulse: 97 98  Resp: 18 18  Temp: 98.2 F (36.8 C)   SpO2: 99% 100%    Intake/Output Summary (Last 24 hours) at 06/29/2022 1144 Last data filed at 06/29/2022 1100 Gross per 24 hour  Intake 50 ml  Output 1100 ml  Net -1050 ml    Filed Weights   06/01/22 0419 06/02/22 0500 06/03/22 0500  Weight: 84.5 kg 84.5 kg 84.9 kg   Weight change:  Body mass index is 25.38 kg/m.   Physical Exam: General exam: Remains comfortable.  I did not do a detailed examination because of comfort care status.  Data Review: I have personally reviewed the laboratory data and studies available.   Total time spent in review of labs and imaging, patient evaluation, formulation of plan, documentation and communication with family: 25 minutes  Signed, Terrilee Croak, MD [facility] 06/29/2022

## 2022-06-29 NOTE — Progress Notes (Signed)
Responded to pt room after phone call stating pt was currently on the floor. Mobility specialist and NT at room at that time. Pt notes sitting on a floor mat facing the restroom with a primafit attached to pt. Nursing and tech changed bedding, placed pt back into bed, Nurse did an assessment, MD notified, family notified, post fall complete.   At this time bed alarm is on most sensitive setting, Nurse is closer to patients room. All other needs met at this time. Call light within reach

## 2022-06-29 NOTE — Progress Notes (Addendum)
Found patient alone clinging to the bed rails with his butt on the ground. The bed alarm had been going off for almost 4 minutes. NT and RN in room

## 2022-06-29 NOTE — Progress Notes (Addendum)
Responded to a call that patient on the floor, upon arrival, Mobility staff and tech in the room,staff  said patient was holding  on the bed ,halfway lying on the bed and staff assisted him to the floor, Patient was sitting on the floor upon my arrival, alert, confused,no apparent injury noted, no change of LOC.  Floor mat on the floor, bed alarm on , bed on low position. Vital sign witihin normal limits.Assisted back to bed. Primary Nurse to Notify MD. Post huddle started and endorse to primary RN.

## 2022-06-29 NOTE — Progress Notes (Signed)
Nurse called daughter [name] regarding fall, Nurse left voicemail. Waiting for daughter to call back.

## 2022-06-30 DIAGNOSIS — R0902 Hypoxemia: Secondary | ICD-10-CM | POA: Diagnosis not present

## 2022-06-30 DIAGNOSIS — U071 COVID-19: Secondary | ICD-10-CM | POA: Diagnosis not present

## 2022-06-30 DIAGNOSIS — J189 Pneumonia, unspecified organism: Secondary | ICD-10-CM | POA: Diagnosis not present

## 2022-06-30 DIAGNOSIS — W19XXXA Unspecified fall, initial encounter: Secondary | ICD-10-CM | POA: Diagnosis not present

## 2022-06-30 MED ORDER — LORAZEPAM 2 MG/ML PO CONC
1.0000 mg | ORAL | Status: DC | PRN
  Administered 2022-07-03: 1 mg via ORAL
  Filled 2022-06-30: qty 1

## 2022-06-30 MED ORDER — HALOPERIDOL 1 MG PO TABS: 2.0000 mg | ORAL_TABLET | Freq: Four times a day (QID) | ORAL | Status: DC | PRN

## 2022-06-30 MED ORDER — LORAZEPAM 1 MG PO TABS
1.0000 mg | ORAL_TABLET | ORAL | Status: DC | PRN
  Administered 2022-07-01 (×3): 1 mg via ORAL
  Filled 2022-06-30 (×3): qty 1

## 2022-06-30 MED ORDER — HALOPERIDOL LACTATE 2 MG/ML PO CONC
2.0000 mg | Freq: Four times a day (QID) | ORAL | Status: DC | PRN
  Administered 2022-07-01 – 2022-07-03 (×2): 2 mg via SUBLINGUAL
  Filled 2022-06-30 (×3): qty 5

## 2022-06-30 MED ORDER — HALOPERIDOL LACTATE 5 MG/ML IJ SOLN: 2.0000 mg | Freq: Four times a day (QID) | INTRAMUSCULAR | Status: DC | PRN

## 2022-06-30 NOTE — Progress Notes (Signed)
PROGRESS NOTE  Richard Kent  DOB: 17-Mar-1929  PCP: Denita Lung, MD [id number]  DOA: 05/29/2022  LOS: 32 days  Hospital Day: 33  Brief narrative: Richard Kent is a 89 y.o. male with PMH significant for CAD, HTN, HLD, nephrolithiasis, DM II, PAF, dementia  Once received, patient was brought to the hospital for abdominal/right groin pain and and a fall out of bed.  He was found to be hypoxic on room air requiring oxygen. CTA chest was negative for PE and showed bilateral lower lobe atelectasis/scarring and concern for developing infiltrates in the left lower lobe. He was also found to be positive for COVID-19.  He was started on remdesivir and Unasyn on admission. He also underwent workup for dry gangrene involving left toes.    Palliative evaluated and comfort measures were enacted on 06/05/2022. Patient did not qualify for residential hospice. Family wants to continue comfort care but is not able to take him home with comfort measures. Social work involved to explore his benefits with [facility]. Palliative intermittently following as well. Currently patient remains on IV/oral pain medicines PRN.  Subjective: Patient was seen and examined this morning.  Elderly male. Not in pain.  Opens eyes on verbal command, confused, has mittens on both hands.  Assessment and plan: Comfort care status Hospital course as above.  Currently on comfort care measures. Comfort care order set active. Patient did not qualify for residential hospice. Family wants to continue comfort care but is not able to take him home with comfort measures. Social work involved to explore his benefits with [employer]. Palliative intermittently following as well. Fall precautions on place  Other issues addressed this hospitalization were Severe sepsis due to covid pna and bacterial pna Acute respiratory failure with hypoxia COVID-19 infection Left lower lobe pneumonia: COVID 19 pneumonia with possibly combination of  aspiration pneumonia CAD Chronic bilateral lower extremity edema/lymphedema Gangrenous changes of toes of left foot/peripheral arterial disease Hypertension Leukocytosis Abnormal/Elevated LFTs Hypoalbuminemia Hyponatremia  Diabetes mellitus type 2 with neuropathy Paroxysmal A-fib Dementia Physical deconditioning  Goals of care   Code Status: DNR   Consultants: Palliative care Family Communication: None at bedside  Status is: Inpatient Level of care: Med-Surg   Dispo: Patient is from: Home              Anticipated d/c is to: Difficult to place   Scheduled Meds:  antiseptic oral rinse  15 mL Topical BID   furosemide  10 mg Oral Daily   gabapentin  400 mg Oral BID   morphine CONCENTRATE  5 mg Oral Q6H    PRN meds: acetaminophen **OR** acetaminophen, bisacodyl, glycopyrrolate **OR** glycopyrrolate, haloperidol **OR** haloperidol **OR** haloperidol lactate, hydrocortisone cream, LORazepam **OR** LORazepam, morphine injection, ondansetron **OR** ondansetron (ZOFRAN) IV, polyvinyl alcohol, senna   Infusions:    Diet:  Diet Order             DIET DYS 3 Room service appropriate? Yes with Assist; Fluid consistency: Thin  Diet effective now                   Antimicrobials: Anti-infectives (From admission, onward)    Start     Dose/Rate Route Frequency Ordered Stop   05/31/22 1000  remdesivir 100 mg in sodium chloride 0.9 % 100 mL IVPB       See Hyperspace for full Linked Orders Report.   100 mg 200 mL/hr over 30 Minutes Intravenous Daily 05/30/22 0807 06/01/22 1055   05/30/22 0900  remdesivir 200  mg in sodium chloride 0.9% 250 mL IVPB       See Hyperspace for full Linked Orders Report.   200 mg 580 mL/hr over 30 Minutes Intravenous Once 05/30/22 0807 05/30/22 1220   05/30/22 0400  Ampicillin-Sulbactam (UNASYN) 3 g in sodium chloride 0.9 % 100 mL IVPB        3 g 200 mL/hr over 30 Minutes Intravenous Every 6 hours 05/30/22 0333 06/03/22 2311   05/29/22 2230   cefTRIAXone (ROCEPHIN) 1 g in sodium chloride 0.9 % 100 mL IVPB        1 g 200 mL/hr over 30 Minutes Intravenous  Once 05/29/22 2227 05/29/22 2316   05/29/22 2230  azithromycin (ZITHROMAX) 500 mg in sodium chloride 0.9 % 250 mL IVPB        500 mg 250 mL/hr over 60 Minutes Intravenous  Once 05/29/22 2227 05/30/22 0022       Skin assessment:       Nutritional status:  Body mass index is 25.38 kg/m.          Objective: Vitals:   06/29/22 1041 06/30/22 0601  BP: (!) 142/79 109/84  Pulse: 98 86  Resp: 18   Temp:  98.2 F (36.8 C)  SpO2: 100% 92%    Intake/Output Summary (Last 24 hours) at 06/30/2022 1339 Last data filed at 06/30/2022 0951 Gross per 24 hour  Intake 30 ml  Output 100 ml  Net -70 ml    Filed Weights   06/01/22 0419 06/02/22 0500 06/03/22 0500  Weight: 84.5 kg 84.5 kg 84.9 kg   Weight change:  Body mass index is 25.38 kg/m.   Physical Exam: General exam: Remains comfortable.  I did not do a detailed examination because of comfort care status.  Data Review: I have personally reviewed the laboratory data and studies available.   Total time spent in review of labs and imaging, patient evaluation, formulation of plan, documentation and communication with family: 25 minutes  Signed, Terrilee Croak, MD [facility] 06/30/2022

## 2022-06-30 NOTE — Progress Notes (Incomplete)
Brief Palliative Medicine Progress Note:  PMT following peripherally for needs/decline.  Medical records reviewed including progress notes, labs, imaging. Noted patient fell 2/8 now requiring telesitter.   ***PMT will follow peripherally and visit with patient and family incrementally for goals of care discussions as appropriate and based on clinical course. Goals are clear for ***. If there are any imminent needs please call the service directly. Family also has PMT contact information should further needs arise.  Thank you for allowing PMT to assist in the care of this patient.  [place] M. Tamala Julian [facility] Palliative Medicine Team Team Phone: [phone] NO CHARGE

## 2022-06-30 NOTE — Progress Notes (Signed)
Daily Progress Note   Patient Name: Richard Kent       Date: 06/30/2022 DOB: 09/14/1928  Age: 89 y.o. MRN#: [id number] Attending Physician: Terrilee Croak, MD Primary Care Physician: Denita Lung, MD Admit Date: 05/29/2022  Reason for Consultation/Follow-up: Non pain symptom management, Pain control, Psychosocial/spiritual support, and Terminal Care  Subjective: I have reviewed medical records including EPIC notes and labs. Noted patient fell 2/8 now requiring telesitter.  Comfort medications adjusted as noted below.  Length of Stay: 32  Current Medications: Scheduled Meds:   antiseptic oral rinse  15 mL Topical BID   furosemide  10 mg Oral Daily   gabapentin  400 mg Oral BID   morphine CONCENTRATE  5 mg Oral Q6H    Continuous Infusions:   PRN Meds: acetaminophen **OR** acetaminophen, bisacodyl, glycopyrrolate **OR** glycopyrrolate, haloperidol **OR** haloperidol **OR** haloperidol lactate, hydrocortisone cream, LORazepam **OR** LORazepam, morphine injection, ondansetron **OR** ondansetron (ZOFRAN) IV, polyvinyl alcohol, senna   Vital Signs: BP 109/84   Pulse 86   Temp 98.2 F (36.8 C) (Oral)   Resp 18   Ht 6' (1.829 m)   Wt 84.9 kg   SpO2 92%   BMI 25.38 kg/m  SpO2: SpO2: 92 % O2 Device: O2 Device: Room Air O2 Flow Rate: O2 Flow Rate (L/min): 2 L/min  Intake/output summary:  Intake/Output Summary (Last 24 hours) at 06/30/2022 1737 Last data filed at 06/30/2022 [id number] Gross per 24 hour  Intake 30 ml  Output 100 ml  Net -70 ml   LBM: Last BM Date : 06/30/22 Baseline Weight: Weight: 82.8 kg Most recent weight: Weight: 84.9 kg       Palliative Assessment/Data:      Patient Active Problem List   Diagnosis Date Noted   Comfort measures only status 06/06/2022    PVD (peripheral vascular disease) ([place]) 06/02/2022   COVID-19 virus infection 05/30/2022   Acute hypoxemic respiratory failure ([place]) 05/30/2022   CAD (coronary artery disease) 05/30/2022   Pneumonia 05/29/2022   Basal cell carcinoma 05/24/2022   Atrial fibrillation ([place]) 05/19/2022   Type II diabetes mellitus with complication ([place]) [id number]   New onset type 2 diabetes mellitus ([place]) 09/29/2021   Aortic atherosclerosis ([place]) 09/19/2021   Diarrhea 09/27/2019   Senile purpura ([place]) 01/09/2017   Carotid stenosis, bilateral 01/14/2012   Hypertension 01/12/2012  Hyperlipidemia LDL goal <100 12/17/2009   Hemiplegia, late effect of cerebrovascular disease ([place]) 12/17/2009   RBBB 10/21/2009    Palliative Care Assessment & Plan   Patient Profile: 89 y.o. male  with past medical history of  CAD, hypertension, hyperlipidemia, nephrolithiasis, type 2 diabetes, paroxysmal A-fib on Eliquis, dementia, bilateral lower extremity lymphedema/venous stasis dermatitis admitted on 05/29/2022 with abdominal/right groin pain following an unwitnessed fall out of his bed.    Patient is admitted for acute hypoxemic respiratory failure secondary to COVID-19 infection and left lower lobe pneumonia, concerning for aspiration. PMT has been consulted to assist with goals of care conversation.  Assessment: Principal Problem:   COVID-19 virus infection Active Problems:   Hyperlipidemia LDL goal <100   Hypertension   Type II diabetes mellitus with complication (HCC)   Atrial fibrillation (HCC)   Pneumonia   Acute hypoxemic respiratory failure (HCC)   CAD (coronary artery disease)   PVD (peripheral vascular disease) (HCC)   Comfort measures only status   Recommendations/Plan: Continue full comfort measures Continue DNR/DNI as previously documented Comfort medications adjusted due to increased restlessness/impulsiveness and now with fall on 2/8: haldol 94m q6h PRN agitation/delirium; ativan 118mq1h PRN  anxiety, sleep PMT will follow up tomorrow 2/10 for symptom check   Symptom Management Continue morphine q6h; continue PRN doses for breakthrough symptoms  Tylenol PRN pain/fever Biotin twice daily Robinul PRN secretions Haldol PRN agitation/delirium Ativan PRN anxiety/seizure/sleep/distress Zofran PRN nausea/vomiting Liquifilm Tears PRN dry eye Lasix daily Senna and dulcolax PRN constipation  Goals of Care and Additional Recommendations: Limitations on Scope of Treatment: Full Comfort Care  Code Status:    Code Status Orders  (From admission, onward)           Start     Ordered   06/05/22 1232  Do not attempt resuscitation (DNR)  Continuous       Question Answer Comment  If patient has no pulse and is not breathing Do Not Attempt Resuscitation   If patient has a pulse and/or is breathing: Medical Treatment Goals COMFORT MEASURES: Keep clean/warm/dry, use medication by any route; positioning, wound care and other measures to relieve pain/suffering; use oxygen, suction/manual treatment of airway obstruction for comfort; do not transfer unless for comfort needs.   Consent: Discussion documented in EHR or advanced directives reviewed      06/05/22 1237           Code Status History     Date Active Date Inactive Code Status Order ID Comments User Context   05/30/2022 0320 06/05/2022 1237 DNR [id number] LeffMD Inpatient   09/27/2019 0446 09/28/2019 2216 Full Code [id number] BullsMD Inpatient   01/18/2012 1653 02/09/2012 1532 Full Code [id number] SaxRN Inpatient   01/13/2012 0319 01/18/2012 1653 Full Code [id number] LeisureRN Inpatient      Advance Directive Documentation    Flowsheet Row Most Recent Value  Type of Advance Directive Healthcare Power of Attorney, Living will  Pre-existing out of facility DNR order (yellow form or pink MOST form) --  "MOST" Form in Place? --       Prognosis:  Poor  Discharge  Planning: To Be Determined  Care plan was discussed with primary RN  Thank you for allowing the Palliative Medicine Team to assist in the care of this patient.  AmLin LandsmanNP  Please contact Palliative Medicine Team phone at [phone] questions and concerns.   *Portions  of this note are a verbal dictation therefore any spelling and/or grammatical errors are due to the "[place] One" system interpretation.

## 2022-06-30 NOTE — TOC Progression Note (Signed)
Transition of Care [facility]) - Progression Note    Patient Details  Name: Richard Kent MRN: [id number] Date of Birth: [birth year]  Transition of Care [facility]) CM/SW [place], [place] Phone Number: 06/30/2022, 12:58 PM  Clinical Narrative:    Pt continuing to wait for Medicaid application. CSW to follow up with family for updates on application status. Pt with controlled fall now requiring a tele sitter. TOC will continue to follow for DC needs.    Expected Discharge Plan: [place] Barriers to Discharge: Continued Medical Work up, SNF Pending bed offer  Expected Discharge Plan and Services     Post Acute Care Choice: [place] arrangements for the past 2 months: Single Family Home                                       Social Determinants of Health (SDOH) Interventions SDOH Screenings   Food Insecurity: No Food Insecurity (05/30/2022)  Housing: Low Risk  (05/30/2022)  Transportation Needs: No Transportation Needs (05/30/2022)  Utilities: Not At Risk (05/30/2022)  Depression (PHQ2-9): Low Risk  (09/09/2021)  Financial Resource Strain: Low Risk  (09/09/2021)  Physical Activity: Sufficiently Active (09/09/2021)  Stress: No Stress Concern Present (09/09/2021)  Tobacco Use: Medium Risk (05/31/2022)    Readmission Risk Interventions     No data to display

## 2022-07-01 DIAGNOSIS — W19XXXA Unspecified fall, initial encounter: Secondary | ICD-10-CM | POA: Diagnosis not present

## 2022-07-01 DIAGNOSIS — U071 COVID-19: Secondary | ICD-10-CM | POA: Diagnosis not present

## 2022-07-01 DIAGNOSIS — J189 Pneumonia, unspecified organism: Secondary | ICD-10-CM | POA: Diagnosis not present

## 2022-07-01 DIAGNOSIS — R0902 Hypoxemia: Secondary | ICD-10-CM | POA: Diagnosis not present

## 2022-07-01 MED ORDER — LORAZEPAM 2 MG/ML PO CONC
1.0000 mg | ORAL | Status: DC
  Administered 2022-07-01 – 2022-07-04 (×14): 1 mg via ORAL
  Filled 2022-07-01 (×15): qty 1

## 2022-07-01 MED ORDER — MORPHINE SULFATE (CONCENTRATE) 10 MG/0.5ML PO SOLN
5.0000 mg | ORAL | Status: DC
  Administered 2022-07-01 – 2022-07-04 (×15): 5 mg via ORAL
  Filled 2022-07-01 (×16): qty 0.5

## 2022-07-01 NOTE — Progress Notes (Signed)
Daily Progress Note   Patient Name: Richard Kent       Date: 07/01/2022 DOB: Sep 28, 1928  Age: 89 y.o. MRN#: [id number] Attending Physician: Charlynne Cousins, MD Primary Care Physician: Denita Lung, MD Admit Date: 05/29/2022  Reason for Consultation/Follow-up: Non pain symptom management, Pain control, Psychosocial/spiritual support, and Terminal Care  Subjective: I have reviewed medical records including EPIC notes and labs. Discussed patient's decline with [facility] liaison and requested re-evaluation - they will re-evaluate on Monday 2/12. Received report from primary RN - no acute concerns. RN reports patient has been restless and agitated, biting mitts, pulling off clothes, crawling out of bed - administration of ativan x2 and haldol x1 today in addition to scheduled morphine with relief of symptoms. Apparently, patient has fallen twice.   Went to visit patient at bedside - wife/Richard Kent present. Patient was lying in bed - he is confused, restless, not able to participate in conversation. No respiratory distress, increased work of breathing, or secretions noted. Mitts and telesitter in use.  Emotional support provided to [facility]. Therapeutic listening provided on how difficulty the last several days have been since patient's increased restless and agitation. She tells me she "prays for his suffering to end" - she recognizes he is suffering. We discussed adjustments to medications for better symptom management to include scheduling ativan q4h. Medication education completed. She also questions if morphine can be given q4 instead of q6h - will make adjustment. Therapeutic listening also provided as she reflects over how difficult the entire hospitalization has been, she still very hopeful to not have patient  placed in LTC but [employer]. Reviewed that I had discussed patient's decline with hospice liaison and they would be re-evaluating him on Monday - she is very appreciative.   Per [name], patient has minimal oral intake.  All questions and concerns addressed. Encouraged to call with questions and/or concerns. PMT card provided.  Discussed symptom management plan with RN. Patient does not have IV at this time.  Length of Stay: 33  Current Medications: Scheduled Meds:   antiseptic oral rinse  15 mL Topical BID   furosemide  10 mg Oral Daily   gabapentin  400 mg Oral BID   morphine CONCENTRATE  5 mg Oral Q6H    Continuous Infusions:   PRN Meds: acetaminophen **OR** acetaminophen, bisacodyl, glycopyrrolate **  OR** glycopyrrolate, haloperidol **OR** haloperidol **OR** haloperidol lactate, hydrocortisone cream, LORazepam **OR** LORazepam, morphine injection, ondansetron **OR** ondansetron (ZOFRAN) IV, polyvinyl alcohol, senna  Physical Exam Vitals and nursing note reviewed.  Constitutional:      General: He is not in acute distress.    Appearance: He is ill-appearing.  Pulmonary:     Effort: No respiratory distress.  Skin:    General: Skin is warm and dry.  Neurological:     Mental Status: He is disoriented and confused.     Motor: Weakness present.  Psychiatric:        Speech: He is noncommunicative.        Behavior: Behavior is hyperactive.        Cognition and Memory: Cognition is impaired. Memory is impaired.        Judgment: Judgment is impulsive.             Vital Signs: BP (!) 150/86 (BP Location: Left Arm)   Pulse 91   Temp 97.6 F (36.4 C) (Oral)   Resp 17   Ht 6' (1.829 m)   Wt 84.9 kg   SpO2 97%   BMI 25.38 kg/m  SpO2: SpO2: 97 % O2 Device: O2 Device: Room Air O2 Flow Rate: O2 Flow Rate (L/min): 2 L/min  Intake/output summary:  Intake/Output Summary (Last 24 hours) at 07/01/2022 1651 Last data filed at 07/01/2022 1404 Gross per 24 hour  Intake 690 ml   Output 700 ml  Net -10 ml   LBM: Last BM Date : 06/30/22 Baseline Weight: Weight: 82.8 kg Most recent weight: Weight: 84.9 kg       Palliative Assessment/Data: PPS 20%      Patient Active Problem List   Diagnosis Date Noted   Comfort measures only status 06/06/2022   PVD (peripheral vascular disease) ([place]) 06/02/2022   COVID-19 virus infection 05/30/2022   Acute hypoxemic respiratory failure ([place]) 05/30/2022   CAD (coronary artery disease) 05/30/2022   Pneumonia 05/29/2022   Basal cell carcinoma 05/24/2022   Atrial fibrillation ([place]) 05/19/2022   Type II diabetes mellitus with complication ([place]) [id number]   New onset type 2 diabetes mellitus ([place]) 09/29/2021   Aortic atherosclerosis ([place]) 09/19/2021   Diarrhea 09/27/2019   Senile purpura ([place]) 01/09/2017   Carotid stenosis, bilateral 01/14/2012   Hypertension 01/12/2012   Hyperlipidemia LDL goal <100 12/17/2009   Hemiplegia, late effect of cerebrovascular disease ([place]) 12/17/2009   RBBB 10/21/2009    Palliative Care Assessment & Plan   Patient Profile: 89 y.o. male  with past medical history of  CAD, hypertension, hyperlipidemia, nephrolithiasis, type 2 diabetes, paroxysmal A-fib on Eliquis, dementia, bilateral lower extremity lymphedema/venous stasis dermatitis admitted on 05/29/2022 with abdominal/right groin pain following an unwitnessed fall out of his bed.    Patient is admitted for acute hypoxemic respiratory failure secondary to COVID-19 infection and left lower lobe pneumonia, concerning for aspiration. PMT has been consulted to assist with goals of care conversation.  Assessment: Principal Problem:   COVID-19 virus infection Active Problems:   Hyperlipidemia LDL goal <100   Hypertension   Type II diabetes mellitus with complication (HCC)   Atrial fibrillation (HCC)   Pneumonia   Acute hypoxemic respiratory failure (HCC)   CAD (coronary artery disease)   PVD (peripheral vascular disease) (HCC)   Comfort  measures only status     Recommendations/Plan: Continue full comfort measures Continue DNR/DNI as previously documented Patient will be re-evaluated for [facility] on Monday 2/12 Comfort medications adjusted as noted  below. Goal is to get him comfortable enough to remove mitts and telesitter PMT will continue to follow and support holistically  Symptom Management Adjusted scheduled morphine to q4h; continue PRN doses for breakthrough symptoms  Added scheduled ativan q4h; PRN doses for breakthrough symptoms Tylenol PRN pain/fever Biotin twice daily Robinul PRN secretions Haldol PRN agitation/delirium Zofran PRN nausea/vomiting Liquifilm Tears PRN dry eye Lasix daily Senna and dulcolax PRN constipation  Goals of Care and Additional Recommendations: Limitations on Scope of Treatment: Full Comfort Care  Code Status:    Code Status Orders  (From admission, onward)           Start     Ordered   06/05/22 1232  Do not attempt resuscitation (DNR)  Continuous       Question Answer Comment  If patient has no pulse and is not breathing Do Not Attempt Resuscitation   If patient has a pulse and/or is breathing: Medical Treatment Goals COMFORT MEASURES: Keep clean/warm/dry, use medication by any route; positioning, wound care and other measures to relieve pain/suffering; use oxygen, suction/manual treatment of airway obstruction for comfort; do not transfer unless for comfort needs.   Consent: Discussion documented in EHR or advanced directives reviewed      06/05/22 1237           Code Status History     Date Active Date Inactive Code Status Order ID Comments User Context   05/30/2022 0320 06/05/2022 1237 DNR [id number]  Shela Leff, MD Inpatient   09/27/2019 0446 09/28/2019 2216 Full Code [id number]  Vianne Bulls, MD Inpatient   01/18/2012 1653 02/09/2012 1532 Full Code [id number]  Mliss Sax, RN Inpatient   01/13/2012 0319 01/18/2012 1653 Full Code [id number]  Pecola Leisure, RN Inpatient      Advance Directive Documentation    Flowsheet Row Most Recent Value  Type of Advance Directive Healthcare Power of Attorney, Living will  Pre-existing out of facility DNR order (yellow form or pink MOST form) --  "MOST" Form in Place? --       Prognosis:  < 2 weeks  Discharge Planning: To Be Determined  Care plan was discussed with primary RN, patient's wife, hospice liaison  Thank you for allowing the Palliative Medicine Team to assist in the care of this patient.  Lin Landsman, NP  Please contact Palliative Medicine Team phone at [phone] for questions and concerns.   *Portions of this note are a verbal dictation therefore any spelling and/or grammatical errors are due to the "[place] One" system interpretation.

## 2022-07-01 NOTE — Progress Notes (Signed)
[facility] PROGRESS NOTE    Progress Note  Richard Kent  [id number] DOB: 06/01/1928 DOA: 05/29/2022 PCP: Denita Lung, MD     Brief Narrative:   Richard Kent is an 89 y.o. male past medical history significant for CAD, essential hypertension nephrolithiasis diabetes mellitus type 2, paroxysmal atrial fibrillation who was brought into the hospital after a fall abdominal pain and groin pain was found to be hypoxic requiring oxygen CTA was negative for PE but showed bilateral consolidations found to be COVID 19 positive with started on remdesivir IV Unasyn, he also developed and underwent workup for dry gangrene involving the left toes palliative care met with family and was elected to become comfort measures on 06/05/2022, patient did not qualify for residential hospice, but family wants him to be comfort measures but unable to take him home as he cannot take care of him, he is not deteriorating fast enough to qualify for residential hospice, so the patient will have to go to skilled nursing facility with hospice.  Social worker is involved exploring benefits and [place].  He is currently awaiting skilled nursing facility disposition.  Family is not interested in receiving comfort measures for rehab admission Currently on IV and oral pain medication.   Assessment/Plan:   Comfort care/hospice care: Patient did not qualify for residential hospice facility. Family wants to continue comfort measures but not able to take him home for comfort measures. Social worker is involved exploring benefits with the [place].  Other medical problems were addressed during his hospitalization: Severe sepsis possibly due to COVID-19 virus pneumonia and bacterial infection, he also probably develop aspiration pneumonia. Chronic bilateral lower extremity edema/lymphedema Gangrenous toes changes of the left foot/peripheral arterial disease hypertension Type II diabetes mellitus with complication  (HCC) Chronic atrial fibrillation (HCC) Acute hypoxemic respiratory failure (HCC)   CAD (coronary artery disease)   PVD (peripheral vascular disease) (HCC)   Comfort measures only status    DVT prophylaxis: lovenox Family Communication:none Status is: Inpatient Remains inpatient appropriate because: Awaiting placement    Code Status:     Code Status Orders  (From admission, onward)           Start     Ordered   06/05/22 1232  Do not attempt resuscitation (DNR)  Continuous       Question Answer Comment  If patient has no pulse and is not breathing Do Not Attempt Resuscitation   If patient has a pulse and/or is breathing: Medical Treatment Goals COMFORT MEASURES: Keep clean/warm/dry, use medication by any route; positioning, wound care and other measures to relieve pain/suffering; use oxygen, suction/manual treatment of airway obstruction for comfort; do not transfer unless for comfort needs.   Consent: Discussion documented in EHR or advanced directives reviewed      06/05/22 1237           Code Status History     Date Active Date Inactive Code Status Order ID Comments User Context   05/30/2022 0320 06/05/2022 1237 DNR [id number]  Shela Leff, MD Inpatient   09/27/2019 0446 09/28/2019 2216 Full Code [id number]  Vianne Bulls, MD Inpatient   01/18/2012 1653 02/09/2012 1532 Full Code [id number]  Mliss Sax, RN Inpatient   01/13/2012 0319 01/18/2012 1653 Full Code [id number]  Pecola Leisure, RN Inpatient      Advance Directive Documentation    Flowsheet Row Most Recent Value  Type of Advance Directive Healthcare Power of Attorney, Living will  Pre-existing out of facility DNR order (yellow  form or pink MOST form) --  "MOST" Form in Place? --         IV Access:   Peripheral IV   Procedures and diagnostic studies:   No results found.   Medical Consultants:   None.   Subjective:    Richard Kent nonverbal this morning  Objective:     Vitals:   06/29/22 0543 06/29/22 1041 06/30/22 0601 07/01/22 0527  BP: (!) 146/102 (!) 142/79 109/84 138/72  Pulse: 97 98 86 79  Resp: 18 18  19  $ Temp: 98.2 F (36.8 C)  98.2 F (36.8 C) 97.6 F (36.4 C)  TempSrc: Oral  Oral Axillary  SpO2: 99% 100% 92% 93%  Weight:      Height:       SpO2: 93 % O2 Flow Rate (L/min): 2 L/min   Intake/Output Summary (Last 24 hours) at 07/01/2022 0735 Last data filed at 07/01/2022 0528 Gross per 24 hour  Intake 270 ml  Output 500 ml  Net -230 ml   Filed Weights   06/01/22 0419 06/02/22 0500 06/03/22 0500  Weight: 84.5 kg 84.5 kg 84.9 kg    Exam: General exam: In no acute distress. Respiratory system: Good air movement and clear to auscultation. Cardiovascular system: S1 & S2 heard, RRR. No JVD. Gastrointestinal system: Abdomen is nondistended, soft and nontender.  Extremities: No pedal edema. Skin: Ulcerated dry gangrene of left toes Psychiatry: No judgment or insight of medical condition.   Data Reviewed:    Labs: Basic Metabolic Panel: No results for input(s): "NA", "K", "CL", "CO2", "GLUCOSE", "BUN", "CREATININE", "CALCIUM", "MG", "PHOS" in the last 168 hours. GFR CrCl cannot be calculated (Patient's most recent lab result is older than the maximum 21 days allowed.). Liver Function Tests: No results for input(s): "AST", "ALT", "ALKPHOS", "BILITOT", "PROT", "ALBUMIN" in the last 168 hours. No results for input(s): "LIPASE", "AMYLASE" in the last 168 hours. No results for input(s): "AMMONIA" in the last 168 hours. Coagulation profile No results for input(s): "INR", "PROTIME" in the last 168 hours. COVID-19 Labs  No results for input(s): "DDIMER", "FERRITIN", "LDH", "CRP" in the last 72 hours.  Lab Results  Component Value Date   SARSCOV2NAA POSITIVE (A) 05/30/2022   [place] NEGATIVE 09/27/2019    CBC: No results for input(s): "WBC", "NEUTROABS", "HGB", "HCT", "MCV", "PLT" in the last 168 hours. Cardiac  Enzymes: No results for input(s): "CKTOTAL", "CKMB", "CKMBINDEX", "TROPONINI" in the last 168 hours. BNP (last 3 results) No results for input(s): "PROBNP" in the last 8760 hours. CBG: No results for input(s): "GLUCAP" in the last 168 hours. D-Dimer: No results for input(s): "DDIMER" in the last 72 hours. Hgb A1c: No results for input(s): "HGBA1C" in the last 72 hours. Lipid Profile: No results for input(s): "CHOL", "HDL", "LDLCALC", "TRIG", "CHOLHDL", "LDLDIRECT" in the last 72 hours. Thyroid function studies: No results for input(s): "TSH", "T4TOTAL", "T3FREE", "THYROIDAB" in the last 72 hours.  Invalid input(s): "FREET3" Anemia work up: No results for input(s): "VITAMINB12", "FOLATE", "FERRITIN", "TIBC", "IRON", "RETICCTPCT" in the last 72 hours. Sepsis Labs: No results for input(s): "PROCALCITON", "WBC", "LATICACIDVEN" in the last 168 hours. Microbiology No results found for this or any previous visit (from the past 240 hour(s)).   Medications:    antiseptic oral rinse  15 mL Topical BID   furosemide  10 mg Oral Daily   gabapentin  400 mg Oral BID   morphine CONCENTRATE  5 mg Oral Q6H   Continuous Infusions:    LOS: 33 days  Charlynne Cousins  [facility]  07/01/2022, 7:35 AM

## 2022-07-02 DIAGNOSIS — U071 COVID-19: Secondary | ICD-10-CM | POA: Diagnosis not present

## 2022-07-02 DIAGNOSIS — R638 Other symptoms and signs concerning food and fluid intake: Secondary | ICD-10-CM

## 2022-07-02 DIAGNOSIS — W19XXXA Unspecified fall, initial encounter: Secondary | ICD-10-CM | POA: Diagnosis not present

## 2022-07-02 DIAGNOSIS — J189 Pneumonia, unspecified organism: Secondary | ICD-10-CM | POA: Diagnosis not present

## 2022-07-02 DIAGNOSIS — R0902 Hypoxemia: Secondary | ICD-10-CM | POA: Diagnosis not present

## 2022-07-02 NOTE — Progress Notes (Addendum)
Daily Progress Note   Patient Name: Richard Kent       Date: 07/02/2022 DOB: [birth year]  Age: 89 y.o. MRN#: [id number] Attending Physician: Charlynne Cousins, MD Primary Care Physician: Denita Lung, MD Admit Date: 05/29/2022  Reason for Consultation/Follow-up: Non pain symptom management, Pain control, Psychosocial/spiritual support, and Terminal Care  Subjective: I have reviewed medical records including EPIC notes and labs. Received report from primary RN - no acute concerns. RN reports patient has been sleepy and calm today, but still biting at mitts occasionally.  Went to visit patient at bedside -no family/visitors present. Patient was lying in bed asleep - I did not attempt to wake him to preserve comfort. No signs or non-verbal gestures of pain or discomfort noted. No respiratory distress, increased work of breathing, or secretions noted. Mitts removed - he did not wake to gentle touch.  Called patient's wife/Richard Kent - she was not home but was able to speak with grandson/Richard Kent. Provided updates per assessment today as noted above and that patient would be re-evaluated for [facility] tomorrow. He will provide updates to [facility] and expresses appreciation.  All questions and concerns addressed. Encouraged to call with questions and/or concerns.   Discussed with RN discontinuing telesitter - she requests to keep for another 24 hours. Updated RN that mitts were removed. Discussed will continue symptom mgmt plan.   Length of Stay: 34  Current Medications: Scheduled Meds:   antiseptic oral rinse  15 mL Topical BID   furosemide  10 mg Oral Daily   gabapentin  400 mg Oral BID   LORazepam  1 mg Oral Q4H   morphine CONCENTRATE  5 mg Oral Q4H    Continuous Infusions:   PRN  Meds: acetaminophen **OR** acetaminophen, bisacodyl, glycopyrrolate **OR** glycopyrrolate, haloperidol **OR** haloperidol **OR** haloperidol lactate, hydrocortisone cream, LORazepam **OR** LORazepam, morphine injection, ondansetron **OR** ondansetron (ZOFRAN) IV, polyvinyl alcohol, senna  Physical Exam Vitals and nursing note reviewed.  Constitutional:      General: He is not in acute distress.    Appearance: He is ill-appearing.  Pulmonary:     Effort: No respiratory distress.  Skin:    General: Skin is warm and dry.  Neurological:     Mental Status: He is lethargic.     Motor: Weakness present.  Vital Signs: BP 125/87 (BP Location: Left Arm)   Pulse 87   Temp 98.7 F (37.1 C) (Oral)   Resp 15   Ht 6' (1.829 m)   Wt 84.9 kg   SpO2 94%   BMI 25.38 kg/m  SpO2: SpO2: 94 % O2 Device: O2 Device: Room Air O2 Flow Rate: O2 Flow Rate (L/min): 2 L/min  Intake/output summary:  Intake/Output Summary (Last 24 hours) at 07/02/2022 1250 Last data filed at 07/02/2022 0900 Gross per 24 hour  Intake 540 ml  Output 550 ml  Net -10 ml   LBM: Last BM Date : 06/30/22 Baseline Weight: Weight: 82.8 kg Most recent weight: Weight: 84.9 kg       Palliative Assessment/Data: PPS 10%      Patient Active Problem List   Diagnosis Date Noted   Comfort measures only status 06/06/2022   PVD (peripheral vascular disease) ([place]) 06/02/2022   COVID-19 virus infection 05/30/2022   Acute hypoxemic respiratory failure ([place]) 05/30/2022   CAD (coronary artery disease) 05/30/2022   Pneumonia 05/29/2022   Basal cell carcinoma 05/24/2022   Atrial fibrillation ([place]) 05/19/2022   Type II diabetes mellitus with complication ([place]) [id number]   New onset type 2 diabetes mellitus ([place]) 09/29/2021   Aortic atherosclerosis ([place]) 09/19/2021   Diarrhea 09/27/2019   Senile purpura ([place]) 01/09/2017   Carotid stenosis, bilateral 01/14/2012   Hypertension 01/12/2012   Hyperlipidemia LDL goal <100  12/17/2009   Hemiplegia, late effect of cerebrovascular disease ([place]) 12/17/2009   RBBB 10/21/2009    Palliative Care Assessment & Plan   Patient Profile: 89 y.o. male  with past medical history of  CAD, hypertension, hyperlipidemia, nephrolithiasis, type 2 diabetes, paroxysmal A-fib on Eliquis, dementia, bilateral lower extremity lymphedema/venous stasis dermatitis admitted on 05/29/2022 with abdominal/right groin pain following an unwitnessed fall out of his bed.    Patient is admitted for acute hypoxemic respiratory failure secondary to COVID-19 infection and left lower lobe pneumonia, concerning for aspiration. PMT has been consulted to assist with goals of care conversation.  Assessment: Principal Problem:   COVID-19 virus infection Active Problems:   Hyperlipidemia LDL goal <100   Hypertension   Type II diabetes mellitus with complication (HCC)   Atrial fibrillation (HCC)   Pneumonia   Acute hypoxemic respiratory failure (HCC)   CAD (coronary artery disease)   PVD (peripheral vascular disease) (HCC)   Comfort measures only status   Recommendations/Plan: Continue full comfort measures Continue DNR/DNI as previously documented Patient will be re-evaluated for [facility] on Monday 2/12 Continue current comfort focused medication regimen - no changes today Continue telesitter per RN's request - can discontinue tonight or tomorrow PMT will continue to follow and support holistically   Symptom Management Continue scheduled morphine to q4h; PRN doses for breakthrough symptoms  Continue scheduled ativan q4h; PRN doses for breakthrough symptoms Tylenol PRN pain/fever Biotin twice daily Robinul PRN secretions Haldol PRN agitation/delirium Zofran PRN nausea/vomiting Liquifilm Tears PRN dry eye Lasix daily Senna and dulcolax PRN constipation  Goals of Care and Additional Recommendations: Limitations on Scope of Treatment: Full Comfort Care  Code Status:    Code Status  Orders  (From admission, onward)           Start     Ordered   06/05/22 1232  Do not attempt resuscitation (DNR)  Continuous       Question Answer Comment  If patient has no pulse and is not breathing Do Not Attempt Resuscitation   If patient has  a pulse and/or is breathing: Medical Treatment Goals COMFORT MEASURES: Keep clean/warm/dry, use medication by any route; positioning, wound care and other measures to relieve pain/suffering; use oxygen, suction/manual treatment of airway obstruction for comfort; do not transfer unless for comfort needs.   Consent: Discussion documented in EHR or advanced directives reviewed      06/05/22 1237           Code Status History     Date Active Date Inactive Code Status Order ID Comments User Context   05/30/2022 0320 06/05/2022 1237 DNR [id number]  Shela Leff, MD Inpatient   09/27/2019 0446 09/28/2019 2216 Full Code [id number]  Vianne Bulls, MD Inpatient   01/18/2012 1653 02/09/2012 1532 Full Code [id number]  Mliss Sax, RN Inpatient   01/13/2012 0319 01/18/2012 1653 Full Code [id number]  Pecola Leisure, RN Inpatient      Advance Directive Documentation    Flowsheet Row Most Recent Value  Type of Advance Directive Healthcare Power of Attorney, Living will  Pre-existing out of facility DNR order (yellow form or pink MOST form) --  "MOST" Form in Place? --       Prognosis:  < 2 weeks  Discharge Planning: To Be Determined  Care plan was discussed with primary RN, patient's grandson  Thank you for allowing the Palliative Medicine Team to assist in the care of this patient.   Lin Landsman, NP  Please contact Palliative Medicine Team phone at [phone] for questions and concerns.   *Portions of this note are a verbal dictation therefore any spelling and/or grammatical errors are due to the "[employer]" system interpretation.

## 2022-07-02 NOTE — Plan of Care (Signed)
Problem: Education: Goal: Knowledge of General Education information will improve Description: Including pain rating scale, medication(s)/side effects and non-pharmacologic comfort measures 07/02/2022 1948 by Particia Lather, RN Outcome: Progressing 07/02/2022 1947 by Particia Lather, RN Outcome: Not Progressing   Problem: Health Behavior/Discharge Planning: Goal: Ability to manage health-related needs will improve 07/02/2022 1948 by Particia Lather, RN Outcome: Progressing 07/02/2022 1947 by Particia Lather, RN Outcome: Not Progressing   Problem: Clinical Measurements: Goal: Ability to maintain clinical measurements within normal limits will improve 07/02/2022 1948 by Particia Lather, RN Outcome: Progressing 07/02/2022 1947 by Particia Lather, RN Outcome: Not Progressing Goal: Will remain free from infection 07/02/2022 1948 by Particia Lather, RN Outcome: Progressing 07/02/2022 1947 by Particia Lather, RN Outcome: Not Progressing Goal: Diagnostic test results will improve 07/02/2022 1948 by Particia Lather, RN Outcome: Progressing 07/02/2022 1947 by Particia Lather, RN Outcome: Not Progressing Goal: Respiratory complications will improve 07/02/2022 1948 by Particia Lather, RN Outcome: Progressing 07/02/2022 1947 by Particia Lather, RN Outcome: Not Progressing Goal: Cardiovascular complication will be avoided 07/02/2022 1948 by Particia Lather, RN Outcome: Progressing 07/02/2022 1947 by Particia Lather, RN Outcome: Not Progressing   Problem: Activity: Goal: Risk for activity intolerance will decrease 07/02/2022 1948 by Particia Lather, RN Outcome: Progressing 07/02/2022 1947 by Particia Lather, RN Outcome: Not Progressing   Problem: Nutrition: Goal: Adequate nutrition will be maintained 07/02/2022 1948 by Particia Lather, RN Outcome: Progressing 07/02/2022 1947 by Particia Lather, RN Outcome: Not Progressing   Problem: Coping: Goal: Level  of anxiety will decrease 07/02/2022 1948 by Particia Lather, RN Outcome: Progressing 07/02/2022 1947 by Particia Lather, RN Outcome: Not Progressing   Problem: Elimination: Goal: Will not experience complications related to bowel motility 07/02/2022 1948 by Particia Lather, RN Outcome: Progressing 07/02/2022 1947 by Particia Lather, RN Outcome: Not Progressing Goal: Will not experience complications related to urinary retention 07/02/2022 1948 by Particia Lather, RN Outcome: Progressing 07/02/2022 1947 by Particia Lather, RN Outcome: Not Progressing   Problem: Pain Managment: Goal: General experience of comfort will improve 07/02/2022 1948 by Particia Lather, RN Outcome: Progressing 07/02/2022 1947 by Particia Lather, RN Outcome: Not Progressing   Problem: Safety: Goal: Ability to remain free from injury will improve 07/02/2022 1948 by Particia Lather, RN Outcome: Progressing 07/02/2022 1947 by Particia Lather, RN Outcome: Not Progressing   Problem: Skin Integrity: Goal: Risk for impaired skin integrity will decrease 07/02/2022 1948 by Particia Lather, RN Outcome: Progressing 07/02/2022 1947 by Particia Lather, RN Outcome: Not Progressing   Problem: Education: Goal: Ability to describe self-care measures that may prevent or decrease complications (Diabetes Survival Skills Education) will improve 07/02/2022 1948 by Particia Lather, RN Outcome: Progressing 07/02/2022 1947 by Particia Lather, RN Outcome: Not Progressing Goal: Individualized Educational Video(s) 07/02/2022 1948 by Particia Lather, RN Outcome: Progressing 07/02/2022 1947 by Particia Lather, RN Outcome: Not Progressing   Problem: Coping: Goal: Ability to adjust to condition or change in health will improve 07/02/2022 1948 by Particia Lather, RN Outcome: Progressing 07/02/2022 1947 by Particia Lather, RN Outcome: Not Progressing   Problem: Fluid Volume: Goal: Ability to maintain a  balanced intake and output will improve 07/02/2022 1948 by Particia Lather, RN Outcome: Progressing 07/02/2022 1947 by Particia Lather, RN Outcome: Not Progressing   Problem: Health Behavior/Discharge Planning: Goal: Ability to identify and utilize available resources  and services will improve 07/02/2022 1948 by Particia Lather, RN Outcome: Progressing 07/02/2022 1947 by Particia Lather, RN Outcome: Not Progressing Goal: Ability to manage health-related needs will improve 07/02/2022 1948 by Particia Lather, RN Outcome: Progressing 07/02/2022 1947 by Particia Lather, RN Outcome: Not Progressing   Problem: Metabolic: Goal: Ability to maintain appropriate glucose levels will improve 07/02/2022 1948 by Particia Lather, RN Outcome: Progressing 07/02/2022 1947 by Particia Lather, RN Outcome: Not Progressing   Problem: Nutritional: Goal: Maintenance of adequate nutrition will improve 07/02/2022 1948 by Particia Lather, RN Outcome: Progressing 07/02/2022 1947 by Particia Lather, RN Outcome: Not Progressing Goal: Progress toward achieving an optimal weight will improve 07/02/2022 1948 by Particia Lather, RN Outcome: Progressing 07/02/2022 1947 by Particia Lather, RN Outcome: Not Progressing   Problem: Skin Integrity: Goal: Risk for impaired skin integrity will decrease 07/02/2022 1948 by Particia Lather, RN Outcome: Progressing 07/02/2022 1947 by Particia Lather, RN Outcome: Not Progressing   Problem: Tissue Perfusion: Goal: Adequacy of tissue perfusion will improve 07/02/2022 1948 by Particia Lather, RN Outcome: Progressing 07/02/2022 1947 by Particia Lather, RN Outcome: Not Progressing   Problem: Education: Goal: Knowledge of risk factors and measures for prevention of condition will improve 07/02/2022 1948 by Particia Lather, RN Outcome: Progressing 07/02/2022 1947 by Particia Lather, RN Outcome: Not Progressing   Problem: Coping: Goal:  Psychosocial and spiritual needs will be supported 07/02/2022 1948 by Particia Lather, RN Outcome: Progressing 07/02/2022 1947 by Particia Lather, RN Outcome: Not Progressing   Problem: Respiratory: Goal: Will maintain a patent airway 07/02/2022 1948 by Particia Lather, RN Outcome: Progressing 07/02/2022 1947 by Particia Lather, RN Outcome: Not Progressing Goal: Complications related to the disease process, condition or treatment will be avoided or minimized 07/02/2022 1948 by Particia Lather, RN Outcome: Progressing 07/02/2022 1947 by Particia Lather, RN Outcome: Not Progressing   Problem: Education: Goal: Knowledge of the prescribed therapeutic regimen will improve 07/02/2022 1948 by Particia Lather, RN Outcome: Progressing 07/02/2022 1947 by Particia Lather, RN Outcome: Not Progressing   Problem: Coping: Goal: Ability to identify and develop effective coping behavior will improve 07/02/2022 1948 by Particia Lather, RN Outcome: Progressing 07/02/2022 1947 by Particia Lather, RN Outcome: Not Progressing   Problem: Clinical Measurements: Goal: Quality of life will improve 07/02/2022 1948 by Particia Lather, RN Outcome: Progressing 07/02/2022 1947 by Particia Lather, RN Outcome: Not Progressing   Problem: Respiratory: Goal: Verbalizations of increased ease of respirations will increase 07/02/2022 1948 by Particia Lather, RN Outcome: Progressing 07/02/2022 1947 by Particia Lather, RN Outcome: Not Progressing   Problem: Role Relationship: Goal: Family's ability to cope with current situation will improve 07/02/2022 1948 by Particia Lather, RN Outcome: Progressing 07/02/2022 1947 by Particia Lather, RN Outcome: Not Progressing Goal: Ability to verbalize concerns, feelings, and thoughts to partner or family member will improve 07/02/2022 1948 by Particia Lather, RN Outcome: Progressing 07/02/2022 1947 by Particia Lather, RN Outcome: Not  Progressing   Problem: Pain Management: Goal: Satisfaction with pain management regimen will improve 07/02/2022 1948 by Particia Lather, RN Outcome: Progressing 07/02/2022 1947 by Particia Lather, RN Outcome: Not Progressing

## 2022-07-02 NOTE — Progress Notes (Signed)
[facility] PROGRESS NOTE    Progress Note  [name]  [id number] DOB: 1928/12/07 DOA: 05/29/2022 PCP: Denita Lung, MD     Brief Narrative:   Richard Kent is an 89 y.o. male past medical history significant for CAD, essential hypertension nephrolithiasis diabetes mellitus type 2, paroxysmal atrial fibrillation who was brought into the hospital after a fall abdominal pain and groin pain was found to be hypoxic requiring oxygen CTA was negative for PE but showed bilateral consolidations found to be COVID 19 positive with started on remdesivir IV Unasyn, he also developed and underwent workup for dry gangrene involving the left toes palliative care met with family and was elected to become comfort measures on 06/05/2022, patient did not qualify for residential hospice, but family wants him to be comfort measures but unable to take him home as he cannot take care of him, he is not deteriorating fast enough to qualify for residential hospice, so the patient will have to go to skilled nursing facility with hospice.  Social worker is involved exploring benefits and [place].  He is currently awaiting skilled nursing facility disposition.  Family is not interested in receiving comfort measures for rehab admission Currently on IV and oral pain medication.   Assessment/Plan:   Comfort care/hospice care: She will be [facility]eval[facility] on Monday, 06/23/2022 Family wants to continue comfort measures but not able to take him home for comfort measures.  Other medical problems were addressed during his hospitalization: Severe sepsis possibly due to COVID-19 virus pneumonia and bacterial infection, he also probably develop aspiration pneumonia. Chronic bilateral lower extremity edema/lymphedema Gangrenous toes changes of the left foot/peripheral arterial disease hypertension Type II diabetes mellitus with complication (HCC) Chronic atrial fibrillation (HCC) Acute hypoxemic respiratory  failure (HCC) CAD (coronary artery disease) PVD (peripheral vascular disease) (HCC) Comfort measures only status    DVT prophylaxis: lovenox Family Communication:none Status is: Inpatient Remains inpatient appropriate because: Awaiting placement    Code Status:     Code Status Orders  (From admission, onward)           Start     Ordered   06/05/22 1232  Do not attempt resuscitation (DNR)  Continuous       Question Answer Comment  If patient has no pulse and is not breathing Do Not Attempt Resuscitation   If patient has a pulse and/or is breathing: Medical Treatment Goals COMFORT MEASURES: Keep clean/warm/dry, use medication by any route; positioning, wound care and other measures to relieve pain/suffering; use oxygen, suction/manual treatment of airway obstruction for comfort; do not transfer unless for comfort needs.   Consent: Discussion documented in EHR or advanced directives reviewed      06/05/22 1237           Code Status History     Date Active Date Inactive Code Status Order ID Comments User Context   05/30/2022 0320 06/05/2022 1237 DNR [id number]  Shela Leff, MD Inpatient   09/27/2019 0446 09/28/2019 2216 Full Code [id number]  Vianne Bulls, MD Inpatient   01/18/2012 1653 02/09/2012 1532 Full Code [id number]  Mliss Sax, RN Inpatient   01/13/2012 0319 01/18/2012 1653 Full Code [id number]  Pecola Leisure, RN Inpatient      Advance Directive Documentation    Flowsheet Row Most Recent Value  Type of Advance Directive Healthcare Power of Attorney, Living will  Pre-existing out of facility DNR order (yellow form or pink MOST form) --  "MOST" Form in Place? --  IV Access:   Peripheral IV   Procedures and diagnostic studies:   No results found.   Medical Consultants:   None.   Subjective:    Richard Kent nonverbal this morning  Objective:    Vitals:   07/01/22 1634 07/01/22 2013 07/02/22 0552 07/02/22 0835  BP: (!)  150/86 (!) 120/95 (!) 140/98 125/87  Pulse: 91 91 100 87  Resp: 17 16 20 15  $ Temp: 97.6 F (36.4 C) 97.6 F (36.4 C) 98.1 F (36.7 C) 98.7 F (37.1 C)  TempSrc: Oral Oral Oral Oral  SpO2: 97% 98% 98% 94%  Weight:      Height:       SpO2: 94 % O2 Flow Rate (L/min): 2 L/min   Intake/Output Summary (Last 24 hours) at 07/02/2022 0949 Last data filed at 07/02/2022 [id number] Gross per 24 hour  Intake 690 ml  Output 550 ml  Net 140 ml    Filed Weights   06/01/22 0419 06/02/22 0500 06/03/22 0500  Weight: 84.5 kg 84.5 kg 84.9 kg    Exam: General exam: In no acute distress. Respiratory system: Good air movement and clear to auscultation. Cardiovascular system: S1 & S2 heard, RRR. No JVD. Gastrointestinal system: Abdomen is nondistended, soft and nontender.  Extremities: No pedal edema. Skin: Ulcerated dry gangrene of left toes Psychiatry: No judgment or insight of medical condition.   Data Reviewed:    Labs: Basic Metabolic Panel: No results for input(s): "NA", "K", "CL", "CO2", "GLUCOSE", "BUN", "CREATININE", "CALCIUM", "MG", "PHOS" in the last 168 hours. GFR CrCl cannot be calculated (Patient's most recent lab result is older than the maximum 21 days allowed.). Liver Function Tests: No results for input(s): "AST", "ALT", "ALKPHOS", "BILITOT", "PROT", "ALBUMIN" in the last 168 hours. No results for input(s): "LIPASE", "AMYLASE" in the last 168 hours. No results for input(s): "AMMONIA" in the last 168 hours. Coagulation profile No results for input(s): "INR", "PROTIME" in the last 168 hours. COVID-19 Labs  No results for input(s): "DDIMER", "FERRITIN", "LDH", "CRP" in the last 72 hours.  Lab Results  Component Value Date   SARSCOV2NAA POSITIVE (A) 05/30/2022   [place] NEGATIVE 09/27/2019    CBC: No results for input(s): "WBC", "NEUTROABS", "HGB", "HCT", "MCV", "PLT" in the last 168 hours. Cardiac Enzymes: No results for input(s): "CKTOTAL", "CKMB", "CKMBINDEX",  "TROPONINI" in the last 168 hours. BNP (last 3 results) No results for input(s): "PROBNP" in the last 8760 hours. CBG: No results for input(s): "GLUCAP" in the last 168 hours. D-Dimer: No results for input(s): "DDIMER" in the last 72 hours. Hgb A1c: No results for input(s): "HGBA1C" in the last 72 hours. Lipid Profile: No results for input(s): "CHOL", "HDL", "LDLCALC", "TRIG", "CHOLHDL", "LDLDIRECT" in the last 72 hours. Thyroid function studies: No results for input(s): "TSH", "T4TOTAL", "T3FREE", "THYROIDAB" in the last 72 hours.  Invalid input(s): "FREET3" Anemia work up: No results for input(s): "VITAMINB12", "FOLATE", "FERRITIN", "TIBC", "IRON", "RETICCTPCT" in the last 72 hours. Sepsis Labs: No results for input(s): "PROCALCITON", "WBC", "LATICACIDVEN" in the last 168 hours. Microbiology No results found for this or any previous visit (from the past 240 hour(s)).   Medications:    antiseptic oral rinse  15 mL Topical BID   furosemide  10 mg Oral Daily   gabapentin  400 mg Oral BID   LORazepam  1 mg Oral Q4H   morphine CONCENTRATE  5 mg Oral Q4H   Continuous Infusions:    LOS: 34 days   Charlynne Cousins  [facility]  07/02/2022, 9:49 AM

## 2022-07-03 DIAGNOSIS — U071 COVID-19: Secondary | ICD-10-CM | POA: Diagnosis not present

## 2022-07-03 DIAGNOSIS — Z515 Encounter for palliative care: Secondary | ICD-10-CM | POA: Diagnosis not present

## 2022-07-03 NOTE — Plan of Care (Signed)
  Problem: Education: Goal: Knowledge of General Education information will improve Description: Including pain rating scale, medication(s)/side effects and non-pharmacologic comfort measures Outcome: Progressing   Problem: Health Behavior/Discharge Planning: Goal: Ability to manage health-related needs will improve Outcome: Progressing   Problem: Clinical Measurements: Goal: Ability to maintain clinical measurements within normal limits will improve Outcome: Progressing Goal: Will remain free from infection Outcome: Progressing Goal: Diagnostic test results will improve Outcome: Progressing Goal: Respiratory complications will improve Outcome: Progressing Goal: Cardiovascular complication will be avoided Outcome: Progressing   Problem: Activity: Goal: Risk for activity intolerance will decrease Outcome: Progressing   Problem: Nutrition: Goal: Adequate nutrition will be maintained Outcome: Progressing   Problem: Coping: Goal: Level of anxiety will decrease Outcome: Progressing   Problem: Elimination: Goal: Will not experience complications related to bowel motility Outcome: Progressing Goal: Will not experience complications related to urinary retention Outcome: Progressing   Problem: Pain Managment: Goal: General experience of comfort will improve Outcome: Progressing   Problem: Safety: Goal: Ability to remain free from injury will improve Outcome: Progressing   Problem: Skin Integrity: Goal: Risk for impaired skin integrity will decrease Outcome: Progressing   Problem: Education: Goal: Ability to describe self-care measures that may prevent or decrease complications (Diabetes Survival Skills Education) will improve Outcome: Progressing Goal: Individualized Educational Video(s) Outcome: Progressing   Problem: Coping: Goal: Ability to adjust to condition or change in health will improve Outcome: Progressing   Problem: Fluid Volume: Goal: Ability to  maintain a balanced intake and output will improve Outcome: Progressing   Problem: Health Behavior/Discharge Planning: Goal: Ability to identify and utilize available resources and services will improve Outcome: Progressing Goal: Ability to manage health-related needs will improve Outcome: Progressing   Problem: Metabolic: Goal: Ability to maintain appropriate glucose levels will improve Outcome: Progressing   Problem: Nutritional: Goal: Maintenance of adequate nutrition will improve Outcome: Progressing Goal: Progress toward achieving an optimal weight will improve Outcome: Progressing   Problem: Skin Integrity: Goal: Risk for impaired skin integrity will decrease Outcome: Progressing   Problem: Tissue Perfusion: Goal: Adequacy of tissue perfusion will improve Outcome: Progressing   Problem: Education: Goal: Knowledge of risk factors and measures for prevention of condition will improve Outcome: Progressing   Problem: Coping: Goal: Psychosocial and spiritual needs will be supported Outcome: Progressing   Problem: Respiratory: Goal: Will maintain a patent airway Outcome: Progressing Goal: Complications related to the disease process, condition or treatment will be avoided or minimized Outcome: Progressing   Problem: Education: Goal: Knowledge of the prescribed therapeutic regimen will improve Outcome: Progressing   Problem: Coping: Goal: Ability to identify and develop effective coping behavior will improve Outcome: Progressing   Problem: Clinical Measurements: Goal: Quality of life will improve Outcome: Progressing   Problem: Respiratory: Goal: Verbalizations of increased ease of respirations will increase Outcome: Progressing   Problem: Role Relationship: Goal: Family's ability to cope with current situation will improve Outcome: Progressing Goal: Ability to verbalize concerns, feelings, and thoughts to partner or family member will improve Outcome:  Progressing   Problem: Pain Management: Goal: Satisfaction with pain management regimen will improve Outcome: Progressing

## 2022-07-03 NOTE — Plan of Care (Signed)
Problem: Education: Goal: Knowledge of General Education information will improve Description: Including pain rating scale, medication(s)/side effects and non-pharmacologic comfort measures 07/03/2022 2127 by Particia Lather, RN Outcome: Progressing 07/03/2022 2126 by Particia Lather, RN Outcome: Progressing   Problem: Health Behavior/Discharge Planning: Goal: Ability to manage health-related needs will improve 07/03/2022 2127 by Particia Lather, RN Outcome: Progressing 07/03/2022 2126 by Particia Lather, RN Outcome: Progressing   Problem: Clinical Measurements: Goal: Ability to maintain clinical measurements within normal limits will improve 07/03/2022 2127 by Particia Lather, RN Outcome: Progressing 07/03/2022 2126 by Particia Lather, RN Outcome: Progressing Goal: Will remain free from infection 07/03/2022 2127 by Particia Lather, RN Outcome: Progressing 07/03/2022 2126 by Particia Lather, RN Outcome: Progressing Goal: Diagnostic test results will improve 07/03/2022 2127 by Particia Lather, RN Outcome: Progressing 07/03/2022 2126 by Particia Lather, RN Outcome: Progressing Goal: Respiratory complications will improve 07/03/2022 2127 by Particia Lather, RN Outcome: Progressing 07/03/2022 2126 by Particia Lather, RN Outcome: Progressing Goal: Cardiovascular complication will be avoided 07/03/2022 2127 by Particia Lather, RN Outcome: Progressing 07/03/2022 2126 by Particia Lather, RN Outcome: Progressing   Problem: Activity: Goal: Risk for activity intolerance will decrease 07/03/2022 2127 by Particia Lather, RN Outcome: Progressing 07/03/2022 2126 by Particia Lather, RN Outcome: Progressing   Problem: Nutrition: Goal: Adequate nutrition will be maintained 07/03/2022 2127 by Particia Lather, RN Outcome: Progressing 07/03/2022 2126 by Particia Lather, RN Outcome: Progressing   Problem: Coping: Goal: Level of anxiety will decrease 07/03/2022  2127 by Particia Lather, RN Outcome: Progressing 07/03/2022 2126 by Particia Lather, RN Outcome: Progressing   Problem: Elimination: Goal: Will not experience complications related to bowel motility 07/03/2022 2127 by Particia Lather, RN Outcome: Progressing 07/03/2022 2126 by Particia Lather, RN Outcome: Progressing Goal: Will not experience complications related to urinary retention 07/03/2022 2127 by Particia Lather, RN Outcome: Progressing 07/03/2022 2126 by Particia Lather, RN Outcome: Progressing   Problem: Pain Managment: Goal: General experience of comfort will improve 07/03/2022 2127 by Particia Lather, RN Outcome: Progressing 07/03/2022 2126 by Particia Lather, RN Outcome: Progressing   Problem: Safety: Goal: Ability to remain free from injury will improve 07/03/2022 2127 by Particia Lather, RN Outcome: Progressing 07/03/2022 2126 by Particia Lather, RN Outcome: Progressing   Problem: Skin Integrity: Goal: Risk for impaired skin integrity will decrease 07/03/2022 2127 by Particia Lather, RN Outcome: Progressing 07/03/2022 2126 by Particia Lather, RN Outcome: Progressing   Problem: Education: Goal: Ability to describe self-care measures that may prevent or decrease complications (Diabetes Survival Skills Education) will improve 07/03/2022 2127 by Particia Lather, RN Outcome: Progressing 07/03/2022 2126 by Particia Lather, RN Outcome: Progressing Goal: Individualized Educational Video(s) 07/03/2022 2127 by Particia Lather, RN Outcome: Progressing 07/03/2022 2126 by Particia Lather, RN Outcome: Progressing   Problem: Coping: Goal: Ability to adjust to condition or change in health will improve 07/03/2022 2127 by Particia Lather, RN Outcome: Progressing 07/03/2022 2126 by Particia Lather, RN Outcome: Progressing   Problem: Fluid Volume: Goal: Ability to maintain a balanced intake and output will improve 07/03/2022 2127 by Particia Lather, RN Outcome: Progressing 07/03/2022 2126 by Particia Lather, RN Outcome: Progressing   Problem: Health Behavior/Discharge Planning: Goal: Ability to identify and utilize available resources and services will improve 07/03/2022 2127 by Particia Lather, RN Outcome: Progressing 07/03/2022 2126 by Particia Lather,  RN Outcome: Progressing Goal: Ability to manage health-related needs will improve 07/03/2022 2127 by Particia Lather, RN Outcome: Progressing 07/03/2022 2126 by Particia Lather, RN Outcome: Progressing   Problem: Metabolic: Goal: Ability to maintain appropriate glucose levels will improve 07/03/2022 2127 by Particia Lather, RN Outcome: Progressing 07/03/2022 2126 by Particia Lather, RN Outcome: Progressing   Problem: Nutritional: Goal: Maintenance of adequate nutrition will improve 07/03/2022 2127 by Particia Lather, RN Outcome: Progressing 07/03/2022 2126 by Particia Lather, RN Outcome: Progressing Goal: Progress toward achieving an optimal weight will improve 07/03/2022 2127 by Particia Lather, RN Outcome: Progressing 07/03/2022 2126 by Particia Lather, RN Outcome: Progressing   Problem: Skin Integrity: Goal: Risk for impaired skin integrity will decrease 07/03/2022 2127 by Particia Lather, RN Outcome: Progressing 07/03/2022 2126 by Particia Lather, RN Outcome: Progressing   Problem: Tissue Perfusion: Goal: Adequacy of tissue perfusion will improve 07/03/2022 2127 by Particia Lather, RN Outcome: Progressing 07/03/2022 2126 by Particia Lather, RN Outcome: Progressing   Problem: Education: Goal: Knowledge of risk factors and measures for prevention of condition will improve 07/03/2022 2127 by Particia Lather, RN Outcome: Progressing 07/03/2022 2126 by Particia Lather, RN Outcome: Progressing   Problem: Coping: Goal: Psychosocial and spiritual needs will be supported 07/03/2022 2127 by Particia Lather, RN Outcome:  Progressing 07/03/2022 2126 by Particia Lather, RN Outcome: Progressing   Problem: Respiratory: Goal: Will maintain a patent airway 07/03/2022 2127 by Particia Lather, RN Outcome: Progressing 07/03/2022 2126 by Particia Lather, RN Outcome: Progressing Goal: Complications related to the disease process, condition or treatment will be avoided or minimized 07/03/2022 2127 by Particia Lather, RN Outcome: Progressing 07/03/2022 2126 by Particia Lather, RN Outcome: Progressing   Problem: Education: Goal: Knowledge of the prescribed therapeutic regimen will improve 07/03/2022 2127 by Particia Lather, RN Outcome: Progressing 07/03/2022 2126 by Particia Lather, RN Outcome: Progressing   Problem: Coping: Goal: Ability to identify and develop effective coping behavior will improve 07/03/2022 2127 by Particia Lather, RN Outcome: Progressing 07/03/2022 2126 by Particia Lather, RN Outcome: Progressing   Problem: Clinical Measurements: Goal: Quality of life will improve 07/03/2022 2127 by Particia Lather, RN Outcome: Progressing 07/03/2022 2126 by Particia Lather, RN Outcome: Progressing   Problem: Respiratory: Goal: Verbalizations of increased ease of respirations will increase 07/03/2022 2127 by Particia Lather, RN Outcome: Progressing 07/03/2022 2126 by Particia Lather, RN Outcome: Progressing   Problem: Role Relationship: Goal: Family's ability to cope with current situation will improve 07/03/2022 2127 by Particia Lather, RN Outcome: Progressing 07/03/2022 2126 by Particia Lather, RN Outcome: Progressing Goal: Ability to verbalize concerns, feelings, and thoughts to partner or family member will improve 07/03/2022 2127 by Particia Lather, RN Outcome: Progressing 07/03/2022 2126 by Particia Lather, RN Outcome: Progressing   Problem: Pain Management: Goal: Satisfaction with pain management regimen will improve 07/03/2022 2127 by Particia Lather,  RN Outcome: Progressing 07/03/2022 2126 by Particia Lather, RN Outcome: Progressing

## 2022-07-03 NOTE — Progress Notes (Signed)
Daily Progress Note   Patient Name: Richard Kent       Date: 07/03/2022 DOB: [birth year]  Age: 89 y.o. MRN#: [id number] Attending Physician: Charlynne Cousins, MD Primary Care Physician: Denita Lung, MD Admit Date: 05/29/2022  Reason for Consultation/Follow-up: Non pain symptom management, Pain control, Psychosocial/spiritual support, and Terminal Care  Subjective: I have reviewed medical records. Patient assessed at the bedside.  He is sleeping and snoring, in no acute distress.  Did not attempt to arouse in order to preserve comfort.  No family present during my visit.  MAR reviewed.  Patient received Haldol at 7:30 AM this morning.  Morphine and Ativan have been given consistently per orders, with 1 additional dose of both Ativan and morphine required at 9 AM.  Patient is awaiting further review by [facility] and seems appropriate for residential hospice.  Length of Stay: 35  Physical Exam Vitals and nursing note reviewed.  Constitutional:      General: He is sleeping. He is not in acute distress.    Appearance: He is ill-appearing.  Pulmonary:     Effort: No respiratory distress.  Skin:    General: Skin is warm and dry.  Neurological:     Motor: Weakness present.            Vital Signs: BP (!) 147/105   Pulse (!) 112   Temp 97.8 F (36.6 C) (Oral)   Resp 15   Ht 6' (1.829 m)   Wt 84.9 kg   SpO2 91%   BMI 25.38 kg/m  SpO2: SpO2: 91 % O2 Device: O2 Device: Room Air O2 Flow Rate: O2 Flow Rate (L/min): 2 L/min  Palliative Assessment/Data: PPS 10%   Palliative Care Assessment & Plan   Patient Profile: 89 y.o. male  with past medical history of  CAD, hypertension, hyperlipidemia, nephrolithiasis, type 2 diabetes, paroxysmal A-fib on Eliquis, dementia, bilateral  lower extremity lymphedema/venous stasis dermatitis admitted on 05/29/2022 with abdominal/right groin pain following an unwitnessed fall out of his bed.    Patient is admitted for acute hypoxemic respiratory failure secondary to COVID-19 infection and left lower lobe pneumonia, concerning for aspiration. PMT has been consulted to assist with goals of care conversation.  Assessment: Principal Problem:   COVID-19 virus infection Active Problems:   Hyperlipidemia LDL goal <100   Hypertension  Type II diabetes mellitus with complication (HCC)   Atrial fibrillation (HCC)   Pneumonia   Acute hypoxemic respiratory failure (HCC)   CAD (coronary artery disease)   PVD (peripheral vascular disease) (HCC)   Comfort measures only status   Recommendations/Plan: Continue DNR/DNI as previously documented Continue comfort care with current comfort focused medication regimen - no changes today Patient will be re-evaluated for [facility] today Monday 2/12, appears appropriate for transfer if he is deemed eligible Can discontinue TeleSitter and use appropriate medications for any agitation or other symptoms of discomfort PMT will continue to follow and support holistically   Symptom Management Continue scheduled morphine to q4h; PRN doses for breakthrough symptoms  Continue scheduled ativan q4h; PRN doses for breakthrough symptoms Tylenol PRN pain/fever Biotin twice daily Robinul PRN secretions Haldol PRN agitation/delirium Zofran PRN nausea/vomiting Liquifilm Tears PRN dry eye Lasix daily Senna and dulcolax PRN constipation  Prognosis:  < 2 weeks  Discharge Planning: To Be Determined   MDM: High   Jentzen Minasyan Gregary Signs Palliative Medicine Team Team phone # [phone]  Thank you for allowing the Palliative Medicine Team to assist in the care of this patient. Please utilize secure chat with additional questions, if there is no response within 30 minutes please call the above phone  number.  Palliative Medicine Team providers are available by phone from 7am to 7pm daily and can be reached through the team cell phone.  Should this patient require assistance outside of these hours, please call the patient's attending physician.  Portions of this note are a verbal dictation therefore any spelling and/or grammatical errors are due to the "[place] [employer]" system interpretation.

## 2022-07-03 NOTE — Progress Notes (Signed)
[facility] PROGRESS NOTE    Progress Note  Richard Kent  [id number] DOB: 1928/07/06 DOA: 05/29/2022 PCP: Denita Lung, MD     Brief Narrative:   Richard Kent is an 89 y.o. male past medical history significant for CAD, essential hypertension nephrolithiasis diabetes mellitus type 2, paroxysmal atrial fibrillation who was brought into the hospital after a fall abdominal pain and groin pain was found to be hypoxic requiring oxygen CTA was negative for PE but showed bilateral consolidations found to be COVID 19 positive with started on remdesivir IV Unasyn, he also developed and underwent workup for dry gangrene involving the left toes palliative care met with family and was elected to become comfort measures on 06/05/2022, patient did not qualify for residential hospice, but family wants him to be comfort measures but unable to take him home as he cannot take care of him, he is not deteriorating fast enough to qualify for residential hospice, so the patient will have to go to skilled nursing facility with hospice.  Social worker is involved exploring benefits and [place].  He is currently awaiting skilled nursing facility disposition.  Family is not interested in receiving comfort measures for rehab admission Currently on IV and oral pain medication.   Assessment/Plan:   Comfort care/hospice care: She will [facility]evaluated for [facility] Monday, 06/23/2022 Family wants to continue comfort measures but not able to take him home for comfort measures.  Other medical problems were addressed during his hospitalization: Severe sepsis possibly due to COVID-19 virus pneumonia and bacterial infection, he also probably develop aspiration pneumonia. Chronic bilateral lower extremity edema/lymphedema Gangrenous toes changes of the left foot/peripheral arterial disease hypertension Type II diabetes mellitus with complication (HCC) Chronic atrial fibrillation (HCC) Acute hypoxemic respiratory  failure (HCC) CAD (coronary artery disease) PVD (peripheral vascular disease) (HCC) Comfort measures only status    DVT prophylaxis: lovenox Family Communication:none Status is: Inpatient Remains inpatient appropriate because: Awaiting placement    Code Status:     Code Status Orders  (From admission, onward)           Start     Ordered   06/05/22 1232  Do not attempt resuscitation (DNR)  Continuous       Question Answer Comment  If patient has no pulse and is not breathing Do Not Attempt Resuscitation   If patient has a pulse and/or is breathing: Medical Treatment Goals COMFORT MEASURES: Keep clean/warm/dry, use medication by any route; positioning, wound care and other measures to relieve pain/suffering; use oxygen, suction/manual treatment of airway obstruction for comfort; do not transfer unless for comfort needs.   Consent: Discussion documented in EHR or advanced directives reviewed      06/05/22 1237           Code Status History     Date Active Date Inactive Code Status Order ID Comments User Context   05/30/2022 0320 06/05/2022 1237 DNR [id number]  Shela Leff, MD Inpatient   09/27/2019 0446 09/28/2019 2216 Full Code [id number]  Vianne Bulls, MD Inpatient   01/18/2012 1653 02/09/2012 1532 Full Code [id number]  Mliss Sax, RN Inpatient   01/13/2012 0319 01/18/2012 1653 Full Code [id number]  Pecola Leisure, RN Inpatient      Advance Directive Documentation    Flowsheet Row Most Recent Value  Type of Advance Directive Healthcare Power of Attorney, Living will  Pre-existing out of facility DNR order (yellow form or pink MOST form) --  "MOST" Form in Place? --  IV Access:   Peripheral IV   Procedures and diagnostic studies:   No results found.   Medical Consultants:   None.   Subjective:    Richard Kent nonverbal this morning  Objective:    Vitals:   07/01/22 2013 07/02/22 0552 07/02/22 0835 07/03/22 0518  BP: (!)  120/95 (!) 140/98 125/87 (!) 147/105  Pulse: 91 100 87 (!) 112  Resp: 16 20 15   $ Temp: 97.6 F (36.4 C) 98.1 F (36.7 C) 98.7 F (37.1 C) 97.8 F (36.6 C)  TempSrc: Oral Oral Oral Oral  SpO2: 98% 98% 94% 91%  Weight:      Height:       SpO2: 91 % O2 Flow Rate (L/min): 2 L/min  No intake or output data in the 24 hours ending 07/03/22 0904  Filed Weights   06/01/22 0419 06/02/22 0500 06/03/22 0500  Weight: 84.5 kg 84.5 kg 84.9 kg    Exam: General exam: In no acute distress. Respiratory system: Good air movement and clear to auscultation. Cardiovascular system: S1 & S2 heard, RRR. No JVD. Gastrointestinal system: Abdomen is nondistended, soft and nontender.  Extremities: No pedal edema. Skin: Ulcerated dry gangrene of left toes Psychiatry: No judgment or insight of medical condition.   Data Reviewed:    Labs: Basic Metabolic Panel: No results for input(s): "NA", "K", "CL", "CO2", "GLUCOSE", "BUN", "CREATININE", "CALCIUM", "MG", "PHOS" in the last 168 hours. GFR CrCl cannot be calculated (Patient's most recent lab result is older than the maximum 21 days allowed.). Liver Function Tests: No results for input(s): "AST", "ALT", "ALKPHOS", "BILITOT", "PROT", "ALBUMIN" in the last 168 hours. No results for input(s): "LIPASE", "AMYLASE" in the last 168 hours. No results for input(s): "AMMONIA" in the last 168 hours. Coagulation profile No results for input(s): "INR", "PROTIME" in the last 168 hours. COVID-19 Labs  No results for input(s): "DDIMER", "FERRITIN", "LDH", "CRP" in the last 72 hours.  Lab Results  Component Value Date   SARSCOV2NAA POSITIVE (A) 05/30/2022   [place] NEGATIVE 09/27/2019    CBC: No results for input(s): "WBC", "NEUTROABS", "HGB", "HCT", "MCV", "PLT" in the last 168 hours. Cardiac Enzymes: No results for input(s): "CKTOTAL", "CKMB", "CKMBINDEX", "TROPONINI" in the last 168 hours. BNP (last 3 results) No results for input(s): "PROBNP" in  the last 8760 hours. CBG: No results for input(s): "GLUCAP" in the last 168 hours. D-Dimer: No results for input(s): "DDIMER" in the last 72 hours. Hgb A1c: No results for input(s): "HGBA1C" in the last 72 hours. Lipid Profile: No results for input(s): "CHOL", "HDL", "LDLCALC", "TRIG", "CHOLHDL", "LDLDIRECT" in the last 72 hours. Thyroid function studies: No results for input(s): "TSH", "T4TOTAL", "T3FREE", "THYROIDAB" in the last 72 hours.  Invalid input(s): "FREET3" Anemia work up: No results for input(s): "VITAMINB12", "FOLATE", "FERRITIN", "TIBC", "IRON", "RETICCTPCT" in the last 72 hours. Sepsis Labs: No results for input(s): "PROCALCITON", "WBC", "LATICACIDVEN" in the last 168 hours. Microbiology No results found for this or any previous visit (from the past 240 hour(s)).   Medications:    antiseptic oral rinse  15 mL Topical BID   furosemide  10 mg Oral Daily   gabapentin  400 mg Oral BID   LORazepam  1 mg Oral Q4H   morphine CONCENTRATE  5 mg Oral Q4H   Continuous Infusions:    LOS: 35 days   Charlynne Cousins  [facility]  07/03/2022, 9:04 AM

## 2022-07-03 NOTE — Progress Notes (Signed)
[facility]) Hospital Liaison Note  Received request from [place], [facility] for family interest in [facility]. Spoke with patient's wife and grandson to confirm interest and explain services. Eligibility confirmed per [facility] MD. Family would like to see about bed availably tomorrow.   Please do not hesitate to call with any questions.    Thank you, Zigmund Gottron RN  [facility] Liaison [phone]

## 2022-07-03 NOTE — TOC Progression Note (Signed)
Transition of Care [facility]) - Progression Note    Patient Details  Name: Richard Kent MRN: [id number] Date of Birth: 1928/12/29  Transition of Care [facility]) CM/SW Contact  Coralee Pesa, [place] Phone Number: 07/03/2022, 4:00 PM  Clinical Narrative:     CSW notified of plan for pt to transition to [facility] tomorrow. CSW confirmed signed DNR is on the chart. CSW placed DC paperwork on the chart. TOC will continue to follow for DC needs.   Expected Discharge Plan: [place] Barriers to Discharge: Continued Medical Work up, SNF Pending bed offer  Expected Discharge Plan and Services     Post Acute Care Choice: [place] arrangements for the past 2 months: Single Family Home                                       Social Determinants of Health (SDOH) Interventions SDOH Screenings   Food Insecurity: No Food Insecurity (05/30/2022)  Housing: Low Risk  (05/30/2022)  Transportation Needs: No Transportation Needs (05/30/2022)  Utilities: Not At Risk (05/30/2022)  Depression (PHQ2-9): Low Risk  (09/09/2021)  Financial Resource Strain: Low Risk  (09/09/2021)  Physical Activity: Sufficiently Active (09/09/2021)  Stress: No Stress Concern Present (09/09/2021)  Tobacco Use: Medium Risk (05/31/2022)    Readmission Risk Interventions     No data to display

## 2022-07-04 DIAGNOSIS — U071 COVID-19: Secondary | ICD-10-CM | POA: Diagnosis not present

## 2022-07-04 DIAGNOSIS — J189 Pneumonia, unspecified organism: Secondary | ICD-10-CM | POA: Diagnosis not present

## 2022-07-04 DIAGNOSIS — W19XXXA Unspecified fall, initial encounter: Secondary | ICD-10-CM | POA: Diagnosis not present

## 2022-07-04 MED ORDER — LORAZEPAM 2 MG/ML IJ SOLN: 1.0000 mg | INTRAMUSCULAR | 0 refills | Status: AC | PRN

## 2022-07-04 MED ORDER — MORPHINE SULFATE (PF) 2 MG/ML IV SOLN: 1.0000 mg | INTRAVENOUS | 0 refills | Status: AC | PRN

## 2022-07-04 MED ORDER — LORAZEPAM 2 MG/ML IJ SOLN: 1.0000 mg | INTRAMUSCULAR | Status: DC | PRN

## 2022-07-04 NOTE — Care Management Important Message (Signed)
Important Message  Patient Details  Name: Richard Kent MRN: [id number] Date of Birth: Aug 25, 1928   Medicare Important Message Given:  No     Hannah Beat 07/04/2022, 2:19 PM

## 2022-07-04 NOTE — Progress Notes (Signed)
[facility]) Hospital Liaison Note   Spoke with patient's wife this morning to offer a bed at [facility]. Bed offer was accepted and Wife is agreeable to transfer patient today.    Hospital staff aware.  RN, please call report to [phone] prior to patient leaving the unit. Please send signed and completed DNR with patient at discharge.   Thank you,   Thank you, Zigmund Gottron RN  [facility] Liaison [phone]

## 2022-07-04 NOTE — TOC CM/SW Note (Signed)
Asked by Lonia Chimera to arrange ambulance transport to [facility]. [facility] called. Paperwork in chart. Nurse calling report

## 2022-07-04 NOTE — Discharge Summary (Signed)
Physician Discharge Summary  Richard Kent [id number] DOB: 06-20-1928 DOA: 05/29/2022  PCP: Denita Lung, MD  Admit date: 05/29/2022 Discharge date: 07/04/2022  Admitted From: Home Disposition: Residential hospice facility [facility]   Recommendations for Outpatient Follow-up:    Home Health:no Equipment/Devices:None  Discharge Condition:Stable CODE STATUS:DNR Diet recommendation: Heart Healthy   Brief/Interim Summary: 89 y.o. male past medical history significant for CAD, essential hypertension nephrolithiasis diabetes mellitus type 2, paroxysmal atrial fibrillation who was brought into the hospital after a fall abdominal pain and groin pain was found to be hypoxic requiring oxygen CTA was negative for PE but showed bilateral consolidations found to be COVID 19 positive with started on remdesivir IV Unasyn, he also developed and underwent workup for dry gangrene involving the left toes palliative care met with family and was elected to become comfort measures on 06/05/2022, patient did not qualify for residential hospice, but family wants him to be comfort measures but unable to take him home as he cannot take care of him, he is not deteriorating fast enough to qualify for residential hospice, so the patient will have to go to skilled nursing facility with hospice.  Social worker is involved exploring benefits and [place].  He is currently awaiting skilled nursing facility disposition.  Family is not interested in receiving comfort measures for rehab admission   Discharge Diagnoses:  Principal Problem:   COVID-19 virus infection Active Problems:   Hyperlipidemia LDL goal <100   Hypertension   Type II diabetes mellitus with complication (HCC)   Atrial fibrillation (HCC)   Pneumonia   Acute hypoxemic respiratory failure (HCC)   CAD (coronary artery disease)   PVD (peripheral vascular disease) (HCC)   Comfort measures only status  About a week in his stay the family decided to  proceed to comfort measures on 06/05/2022. Palliative Care has been intermittent following.  Disposition remain difficulty as the family is unable to care at home and they cannot afford private pay skilled nursing facility/[facility] will not feasible or realistic and will not accept the patient. Hospice if continues will continue to work with the family and the patient the patient continues to deteriorate had significant decreased oral intake he was excepted to residential hospice facility which she was transferred to   Discharge Instructions  Discharge Instructions     Diet - low sodium heart healthy   Complete by: As directed    Increase activity slowly   Complete by: As directed    No wound care   Complete by: As directed       Allergies as of 07/04/2022   No Known Allergies      Medication List     STOP taking these medications    acetaminophen 500 MG tablet Commonly known as: TYLENOL   amLODipine 2.5 MG tablet Commonly known as: NORVASC   amoxicillin-clavulanate 875-125 MG tablet Commonly known as: AUGMENTIN   apixaban 5 MG Tabs tablet Commonly known as: Eliquis   furosemide 20 MG tablet Commonly known as: LASIX   gabapentin 400 MG capsule Commonly known as: Neurontin   metoprolol succinate 25 MG 24 hr tablet Commonly known as: Toprol XL   multivitamin with minerals Tabs tablet   mupirocin ointment 2 % Commonly known as: BACTROBAN   ONE TOUCH ULTRA 2 w/Device Kit   OneTouch Delica Lancets [id number] Misc   OneTouch Ultra test strip Generic drug: glucose blood   simvastatin 20 MG tablet Commonly known as: ZOCOR   traMADol 50 MG tablet Commonly known  as: ULTRAM   VITAMIN C PO   VITAMIN D PO       TAKE these medications    LORazepam 2 MG/ML injection Commonly known as: ATIVAN Inject 0.5 mLs (1 mg total) into the vein every 4 (four) hours as needed for anxiety or sedation.   morphine (PF) 2 MG/ML injection Inject 0.5 mLs (1 mg total) into the vein  every 2 (two) hours as needed (or dyspnea).        Follow-up Information     Schedule an appointment as soon as possible for a visit  with Denita Lung, MD.   Specialty: Family Medicine Contact information: [address] [place]  [address] [phone]                No Known Allergies  Consultations: Palliative care Pulmonary and critical care   Procedures/Studies: No results found.   Subjective: Nonverbal  Discharge Exam: Vitals:   07/03/22 2129 07/04/22 0512  BP: (!) 159/137 (!) 128/96  Pulse: 99 (!) 116  Resp:    Temp: 98.6 F (37 C) 97.7 F (36.5 C)  SpO2: (!) 89% (!) 87%   Vitals:   07/03/22 0518 07/03/22 1548 07/03/22 2129 07/04/22 0512  BP: (!) 147/105 (!) 157/95 (!) 159/137 (!) 128/96  Pulse: (!) 112 (!) 107 99 (!) 116  Resp:  16    Temp: 97.8 F (36.6 C) (!) 97.5 F (36.4 C) 98.6 F (37 C) 97.7 F (36.5 C)  TempSrc: Oral Oral    SpO2: 91% 96% (!) 89% (!) 87%  Weight:      Height:        General: Pt is alert, awake, not in acute distress Cardiovascular: RRR, S1/S2 +, no rubs, no gallops Respiratory: CTA bilaterally, no wheezing, no rhonchi Abdominal: Soft, NT, ND, bowel sounds + Extremities: no edema, no cyanosis    The results of significant diagnostics from this hospitalization (including imaging, microbiology, ancillary and laboratory) are listed below for reference.     Microbiology: No results found for this or any previous visit (from the past 240 hour(s)).   Labs: BNP (last 3 results) No results for input(s): "BNP" in the last 8760 hours. Basic Metabolic Panel: No results for input(s): "NA", "K", "CL", "CO2", "GLUCOSE", "BUN", "CREATININE", "CALCIUM", "MG", "PHOS" in the last 168 hours. Liver Function Tests: No results for input(s): "AST", "ALT", "ALKPHOS", "BILITOT", "PROT", "ALBUMIN" in the last 168 hours. No results for input(s): "LIPASE", "AMYLASE" in the last 168 hours. No results for input(s):  "AMMONIA" in the last 168 hours. CBC: No results for input(s): "WBC", "NEUTROABS", "HGB", "HCT", "MCV", "PLT" in the last 168 hours. Cardiac Enzymes: No results for input(s): "CKTOTAL", "CKMB", "CKMBINDEX", "TROPONINI" in the last 168 hours. BNP: Invalid input(s): "POCBNP" CBG: No results for input(s): "GLUCAP" in the last 168 hours. D-Dimer No results for input(s): "DDIMER" in the last 72 hours. Hgb A1c No results for input(s): "HGBA1C" in the last 72 hours. Lipid Profile No results for input(s): "CHOL", "HDL", "LDLCALC", "TRIG", "CHOLHDL", "LDLDIRECT" in the last 72 hours. Thyroid function studies No results for input(s): "TSH", "T4TOTAL", "T3FREE", "THYROIDAB" in the last 72 hours.  Invalid input(s): "FREET3" Anemia work up No results for input(s): "VITAMINB12", "FOLATE", "FERRITIN", "TIBC", "IRON", "RETICCTPCT" in the last 72 hours. Urinalysis    Component Value Date/Time   COLORURINE YELLOW 05/29/2022 [place] 05/29/2022 1549   LABSPEC 1.025 05/29/2022 1549   PHURINE 6.0 05/29/2022 1549   GLUCOSEU NEGATIVE 05/29/2022 1549  HGBUR NEGATIVE 05/29/2022 1549   BILIRUBINUR NEGATIVE 05/29/2022 1549   KETONESUR 5 (A) 05/29/2022 1549   PROTEINUR NEGATIVE 05/29/2022 1549   UROBILINOGEN 1.0 01/13/2012 1638   NITRITE NEGATIVE 05/29/2022 1549   LEUKOCYTESUR NEGATIVE 05/29/2022 1549   Sepsis Labs No results for input(s): "WBC" in the last 168 hours.  Invalid input(s): "PROCALCITONIN", "LACTICIDVEN" Microbiology No results found for this or any previous visit (from the past 240 hour(s)).  SIGNED:   Charlynne Cousins, MD  [facility] 07/04/2022, 8:53 AM Pager   If 7PM-7AM, please contact night-coverage [email or web address] Password [email or web address]

## 2022-07-04 NOTE — Progress Notes (Signed)
2/13 Patient under [place], IMM Letter respectfully not given.

## 2022-07-21 DEATH — deceased

## 2022-09-15 ENCOUNTER — Ambulatory Visit: Payer: Medicare Other

## 2022-12-06 ENCOUNTER — Encounter: Payer: Medicare Other | Admitting: Family Medicine
